# Patient Record
Sex: Female | Born: 1955
Health system: Southern US, Community
[De-identification: ages and names within clinical notes are randomized; demographics above are authoritative.]

## PROBLEM LIST (undated history)

## (undated) DIAGNOSIS — M1990 Unspecified osteoarthritis, unspecified site: Secondary | ICD-10-CM

## (undated) DIAGNOSIS — F419 Anxiety disorder, unspecified: Secondary | ICD-10-CM

## (undated) DIAGNOSIS — R918 Other nonspecific abnormal finding of lung field: Secondary | ICD-10-CM

## (undated) DIAGNOSIS — J45909 Unspecified asthma, uncomplicated: Secondary | ICD-10-CM

## (undated) DIAGNOSIS — K219 Gastro-esophageal reflux disease without esophagitis: Secondary | ICD-10-CM

## (undated) DIAGNOSIS — R011 Cardiac murmur, unspecified: Secondary | ICD-10-CM

## (undated) DIAGNOSIS — I10 Essential (primary) hypertension: Secondary | ICD-10-CM

## (undated) DIAGNOSIS — R609 Edema, unspecified: Secondary | ICD-10-CM

## (undated) DIAGNOSIS — R7303 Prediabetes: Secondary | ICD-10-CM

## (undated) HISTORY — PX: ROTATOR CUFF REPAIR: SHX139

## (undated) HISTORY — DX: Prediabetes: R73.03

## (undated) HISTORY — DX: Unspecified osteoarthritis, unspecified site: M19.90

## (undated) HISTORY — DX: Edema, unspecified: R60.9

## (undated) HISTORY — PX: REPLACEMENT TOTAL KNEE: SUR1224

## (undated) HISTORY — DX: Other nonspecific abnormal finding of lung field: R91.8

## (undated) HISTORY — PX: CHOLECYSTECTOMY: SHX55

## (undated) HISTORY — PX: TONSILLECTOMY: SUR1361

## (undated) HISTORY — DX: Anxiety disorder, unspecified: F41.9

---

## 1997-08-22 ENCOUNTER — Ambulatory Visit (HOSPITAL_COMMUNITY): Admission: RE | Admit: 1997-08-22 | Discharge: 1997-08-22 | Payer: Self-pay | Admitting: Family Medicine

## 1998-06-20 ENCOUNTER — Other Ambulatory Visit: Admission: RE | Admit: 1998-06-20 | Discharge: 1998-06-20 | Payer: Self-pay | Admitting: Gynecology

## 1999-10-01 ENCOUNTER — Other Ambulatory Visit: Admission: RE | Admit: 1999-10-01 | Discharge: 1999-10-01 | Payer: Self-pay | Admitting: Gynecology

## 2001-11-02 ENCOUNTER — Encounter: Payer: Self-pay | Admitting: Family Medicine

## 2001-11-02 ENCOUNTER — Ambulatory Visit (HOSPITAL_COMMUNITY): Admission: RE | Admit: 2001-11-02 | Discharge: 2001-11-02 | Payer: Self-pay | Admitting: Family Medicine

## 2002-01-06 ENCOUNTER — Other Ambulatory Visit: Admission: RE | Admit: 2002-01-06 | Discharge: 2002-01-06 | Payer: Self-pay | Admitting: Gynecology

## 2003-02-09 ENCOUNTER — Other Ambulatory Visit: Admission: RE | Admit: 2003-02-09 | Discharge: 2003-02-09 | Payer: Self-pay | Admitting: Gynecology

## 2004-02-26 ENCOUNTER — Other Ambulatory Visit: Admission: RE | Admit: 2004-02-26 | Discharge: 2004-02-26 | Payer: Self-pay | Admitting: Gynecology

## 2005-02-27 ENCOUNTER — Other Ambulatory Visit: Admission: RE | Admit: 2005-02-27 | Discharge: 2005-02-27 | Payer: Self-pay | Admitting: Gynecology

## 2008-08-10 ENCOUNTER — Encounter: Admission: RE | Admit: 2008-08-10 | Discharge: 2008-08-10 | Payer: Self-pay | Admitting: Family Medicine

## 2010-03-12 ENCOUNTER — Encounter: Admission: RE | Admit: 2010-03-12 | Discharge: 2010-03-12 | Payer: Self-pay | Admitting: Chiropractic Medicine

## 2011-10-17 ENCOUNTER — Other Ambulatory Visit: Payer: Self-pay | Admitting: Pediatrics

## 2011-10-17 DIAGNOSIS — J329 Chronic sinusitis, unspecified: Secondary | ICD-10-CM

## 2011-10-21 ENCOUNTER — Ambulatory Visit
Admission: RE | Admit: 2011-10-21 | Discharge: 2011-10-21 | Disposition: A | Discharge status: Home / Self Care | Payer: BC Managed Care – PPO | Source: Ambulatory Visit | Attending: Pediatrics | Admitting: Pediatrics

## 2011-10-21 DIAGNOSIS — J329 Chronic sinusitis, unspecified: Secondary | ICD-10-CM

## 2013-08-19 ENCOUNTER — Other Ambulatory Visit: Payer: Self-pay | Admitting: Gynecology

## 2013-08-19 DIAGNOSIS — Z780 Asymptomatic menopausal state: Secondary | ICD-10-CM

## 2013-09-02 ENCOUNTER — Ambulatory Visit
Admission: RE | Admit: 2013-09-02 | Discharge: 2013-09-02 | Disposition: A | Discharge status: Home / Self Care | Payer: BC Managed Care – PPO | Source: Ambulatory Visit | Attending: Gynecology | Admitting: Gynecology

## 2013-09-02 ENCOUNTER — Encounter (INDEPENDENT_AMBULATORY_CARE_PROVIDER_SITE_OTHER): Payer: Self-pay

## 2013-09-02 DIAGNOSIS — Z780 Asymptomatic menopausal state: Secondary | ICD-10-CM

## 2014-08-10 NOTE — H&P (Signed)
TOTAL HIP ADMISSION H&P  Patient is admitted for right total hip arthroplasty, anterior approach.  Subjective:  Chief Complaint:    Right hip primary OA / pain  HPI: Melanie Armstrong, 59 y.o. female, has a history of pain and functional disability in the right hip(s) due to arthritis and patient has failed non-surgical conservative treatments for greater than 12 weeks to include NSAID's and/or analgesics, corticosteriod injections and activity modification.  Onset of symptoms was gradual starting 2+ years ago with gradually worsening course since that time.The patient noted no past surgery on the right hip(s).  Patient currently rates pain in the right hip at 10 out of 10 with activity. Patient has night pain, worsening of pain with activity and weight bearing, trendelenberg gait, pain that interfers with activities of daily living and pain with passive range of motion. Patient has evidence of periarticular osteophytes and joint space narrowing by imaging studies. This condition presents safety issues increasing the risk of falls.  There is no current active infection.  Risks, benefits and expectations were discussed with the patient.  Risks including but not limited to the risk of anesthesia, blood clots, nerve damage, blood vessel damage, failure of the prosthesis, infection and up to and including death.  Patient understand the risks, benefits and expectations and wishes to proceed with surgery.   PCP: [name], MD  D/C Plans:      Home with HHPT  Post-op Meds:       No Rx given   Tranexamic Acid:      To be given - IV  Decadron:      Is to be given  FYI:     Xarelto post-op  (issues with ASA and asthma)  Norco post-op    Past Medical History  Diagnosis Date  . Hypertension   . Heart murmur     as a child   . Asthma   . GERD (gastroesophageal reflux disease)   . Arthritis     Past Surgical History  Procedure Laterality Date  . Cholecystectomy    . Tonsillectomy      No  prescriptions prior to admission   No Known Allergies   History  Substance Use Topics  . Smoking status: Never Smoker   . Smokeless tobacco: Never Used  . Alcohol Use: No       Review of Systems  Constitutional: Negative.   HENT: Negative.   Eyes: Negative.   Respiratory: Negative.   Cardiovascular: Negative.   Gastrointestinal: Positive for heartburn.  Genitourinary: Negative.   Musculoskeletal: Positive for joint pain.  Skin: Negative.   Neurological: Negative.   Endo/Heme/Allergies: Positive for environmental allergies.  Psychiatric/Behavioral: Negative.     Objective:  Physical Exam  Constitutional: She is oriented to person, place, and time. She appears well-developed and well-nourished.  HENT:  Head: Normocephalic and atraumatic.  Eyes: Pupils are equal, round, and reactive to light.  Neck: Neck supple. No JVD present. No tracheal deviation present. No thyromegaly present.  Cardiovascular: Normal rate, regular rhythm, normal heart sounds and intact distal pulses.   Respiratory: Effort normal and breath sounds normal. No stridor. No respiratory distress. She has no wheezes.  GI: Soft. There is no tenderness. There is no guarding.  Musculoskeletal:       Right hip: She exhibits decreased range of motion, decreased strength, tenderness and bony tenderness. She exhibits no swelling, no deformity and no laceration.  Lymphadenopathy:    She has no cervical adenopathy.  Neurological: She is  alert and oriented to person, place, and time.  Skin: Skin is warm and dry.  Psychiatric: She has a normal mood and affect.      Vital signs in last 24 hours: Temp:  [98.1 F (36.7 C)] 98.1 F (36.7 C) (05/03 1451) Pulse Rate:  [100] 100 (05/03 1451) Resp:  [16] 16 (05/03 1451) BP: (151)/(71) 151/71 mmHg (05/03 1451) SpO2:  [96 %] 96 % (05/03 1451) Weight:  [101.152 kg (223 lb)] 101.152 kg (223 lb) (05/03 1451)    Imaging Review Plain radiographs demonstrate severe  degenerative joint disease of the right hip(s). The bone quality appears to be good for age and reported activity level.  Assessment/Plan:  End stage arthritis, right hip(s)  The patient history, physical examination, clinical judgement of the provider and imaging studies are consistent with end stage degenerative joint disease of the right hip(s) and total hip arthroplasty is deemed medically necessary. The treatment options including medical management, injection therapy, arthroscopy and arthroplasty were discussed at length. The risks and benefits of total hip arthroplasty were presented and reviewed. The risks due to aseptic loosening, infection, stiffness, dislocation/subluxation,  thromboembolic complications and other imponderables were discussed.  The patient acknowledged the explanation, agreed to proceed with the plan and consent was signed. Patient is being admitted for inpatient treatment for surgery, pain control, PT, OT, prophylactic antibiotics, VTE prophylaxis, progressive ambulation and ADL's and discharge planning.The patient is planning to be discharged home with home health services.       [place] Vallie Teters   PA-C  08/16/2014, 12:46 PM

## 2014-08-14 NOTE — Patient Instructions (Signed)
Melanie Armstrong  08/14/2014   Your procedure is scheduled on: 08/22/2014    Report to [facility] Main  Entrance and follow signs to               [place] at        [facility] AM.  Call this number if you have problems the morning of surgery [phone]   Remember:  Do not eat food or drink liquids :After Midnight.     Take these medicines the morning of surgery with A SIP OF WATER:    Inhalers and bring, Nebulizer if needed, Azelastine  And bring , Symbicort, diazepam if needed, Allegra, Flonase, Prilosec, Spiriva                                 You may not have any metal on your body including hair pins and              piercings  Do not wear jewelry, make-up, lotions, powders or perfumes., deodorant.               Do not wear nail polish.  Do not shave  48 hours prior to surgery.     Do not bring valuables to the hospital. [place].  Contacts, dentures or bridgework may not be worn into surgery.  Leave suitcase in the car. After surgery it may be brought to your room.         Special Instructions: coughing and deep breathing exercises, leg exercises [place] - Preparing for Surgery Before surgery, you can play an important role.  Because skin is not sterile, your skin needs to be as free of germs as possible.  You can reduce the number of germs on your skin by washing with CHG (chlorahexidine gluconate) soap before surgery.  CHG is an antiseptic cleaner which kills germs and bonds with the skin to continue killing germs even after washing. Please DO NOT use if you have an allergy to CHG or antibacterial soaps.  If your skin becomes reddened/irritated stop using the CHG and inform your nurse when you arrive at Short Stay. Do not shave (including legs and underarms) for at least 48 hours prior to the first CHG shower.  You may shave your face/neck. Please follow these instructions carefully:  1.  Shower  with CHG Soap the night before surgery and the  morning of Surgery.  2.  If you choose to wash your hair, wash your hair first as usual with your  normal  shampoo.  3.  After you shampoo, rinse your hair and body thoroughly to remove the  shampoo.                           4.  Use CHG as you would any other liquid soap.  You can apply chg directly  to the skin and wash                       Gently with a scrungie or clean washcloth.  5.  Apply the CHG Soap to your body ONLY FROM THE NECK DOWN.   Do not use on face/ open  Wound or open sores. Avoid contact with eyes, ears mouth and genitals (private parts).                       Wash face,  Genitals (private parts) with your normal soap.             6.  Wash thoroughly, paying special attention to the area where your surgery  will be performed.  7.  Thoroughly rinse your body with warm water from the neck down.  8.  DO NOT shower/wash with your normal soap after using and rinsing off  the CHG Soap.                9.  Pat yourself dry with a clean towel.            10.  Wear clean pajamas.            11.  Place clean sheets on your bed the night of your first shower and do not  sleep with pets. Day of Surgery : Do not apply any lotions/deodorants the morning of surgery.  Please wear clean clothes to the hospital/surgery center.  FAILURE TO FOLLOW THESE INSTRUCTIONS MAY RESULT IN THE CANCELLATION OF YOUR SURGERY PATIENT SIGNATURE_________________________________  NURSE SIGNATURE__________________________________  ________________________________________________________________________  WHAT IS A BLOOD TRANSFUSION? Blood Transfusion Information  A transfusion is the replacement of blood or some of its parts. Blood is made up of multiple cells which provide different functions.  Red blood cells carry oxygen and are used for blood loss replacement.  White blood cells fight against infection.  Platelets control  bleeding.  Plasma helps clot blood.  Other blood products are available for specialized needs, such as hemophilia or other clotting disorders. BEFORE THE TRANSFUSION  Who gives blood for transfusions?   Healthy volunteers who are fully evaluated to make sure their blood is safe. This is blood bank blood. Transfusion therapy is the safest it has ever been in the practice of medicine. Before blood is taken from a donor, a complete history is taken to make sure that person has no history of diseases nor engages in risky social behavior (examples are intravenous drug use or sexual activity with multiple partners). The donor's travel history is screened to minimize risk of transmitting infections, such as malaria. The donated blood is tested for signs of infectious diseases, such as HIV and hepatitis. The blood is then tested to be sure it is compatible with you in order to minimize the chance of a transfusion reaction. If you or a relative donates blood, this is often done in anticipation of surgery and is not appropriate for emergency situations. It takes many days to process the donated blood. RISKS AND COMPLICATIONS Although transfusion therapy is very safe and saves many lives, the main dangers of transfusion include:  1. Getting an infectious disease. 2. Developing a transfusion reaction. This is an allergic reaction to something in the blood you were given. Every precaution is taken to prevent this. The decision to have a blood transfusion has been considered carefully by your caregiver before blood is given. Blood is not given unless the benefits outweigh the risks. AFTER THE TRANSFUSION  Right after receiving a blood transfusion, you will usually feel much better and more energetic. This is especially true if your red blood cells have gotten low (anemic). The transfusion raises the level of the red blood cells which carry oxygen, and this usually causes an energy increase.  The  nurse  administering the transfusion will monitor you carefully for complications. HOME CARE INSTRUCTIONS  No special instructions are needed after a transfusion. You may find your energy is better. Speak with your caregiver about any limitations on activity for underlying diseases you may have. SEEK MEDICAL CARE IF:   Your condition is not improving after your transfusion.  You develop redness or irritation at the intravenous (IV) site. SEEK IMMEDIATE MEDICAL CARE IF:  Any of the following symptoms occur over the next 12 hours:  Shaking chills.  You have a temperature by mouth above 102 F (38.9 C), not controlled by medicine.  Chest, back, or muscle pain.  People around you feel you are not acting correctly or are confused.  Shortness of breath or difficulty breathing.  Dizziness and fainting.  You get a rash or develop hives.  You have a decrease in urine output.  Your urine turns a dark color or changes to pink, red, or brown. Any of the following symptoms occur over the next 10 days:  You have a temperature by mouth above 102 F (38.9 C), not controlled by medicine.  Shortness of breath.  Weakness after normal activity.  The white part of the eye turns yellow (jaundice).  You have a decrease in the amount of urine or are urinating less often.  Your urine turns a dark color or changes to pink, red, or brown. Document Released: 03/28/2000 Document Revised: 06/23/2011 Document Reviewed: 11/15/2007 ExitCare Patient Information 2014 [place].  _______________________________________________________________________  Incentive Spirometer  An incentive spirometer is a tool that can help keep your lungs clear and active. This tool measures how well you are filling your lungs with each breath. Taking long deep breaths may help reverse or decrease the chance of developing breathing (pulmonary) problems (especially infection) following:  A long period of time when you are  unable to move or be active. BEFORE THE PROCEDURE   If the spirometer includes an indicator to show your best effort, your nurse or respiratory therapist will set it to a desired goal.  If possible, sit up straight or lean slightly forward. Try not to slouch.  Hold the incentive spirometer in an upright position. INSTRUCTIONS FOR USE  3. Sit on the edge of your bed if possible, or sit up as far as you can in bed or on a chair. 4. Hold the incentive spirometer in an upright position. 5. Breathe out normally. 6. Place the mouthpiece in your mouth and seal your lips tightly around it. 7. Breathe in slowly and as deeply as possible, raising the piston or the ball toward the top of the column. 8. Hold your breath for 3-5 seconds or for as long as possible. Allow the piston or ball to fall to the bottom of the column. 9. Remove the mouthpiece from your mouth and breathe out normally. 10. Rest for a few seconds and repeat Steps 1 through 7 at least 10 times every 1-2 hours when you are awake. Take your time and take a few normal breaths between deep breaths. 11. The spirometer may include an indicator to show your best effort. Use the indicator as a goal to work toward during each repetition. 12. After each set of 10 deep breaths, practice coughing to be sure your lungs are clear. If you have an incision (the cut made at the time of surgery), support your incision when coughing by placing a pillow or rolled up towels firmly against it. Once you are able to get  out of bed, walk around indoors and cough well. You may stop using the incentive spirometer when instructed by your caregiver.  RISKS AND COMPLICATIONS  Take your time so you do not get dizzy or light-headed.  If you are in pain, you may need to take or ask for pain medication before doing incentive spirometry. It is harder to take a deep breath if you are having pain. AFTER USE  Rest and breathe slowly and easily.  It can be helpful to  keep track of a log of your progress. Your caregiver can provide you with a simple table to help with this. If you are using the spirometer at home, follow these instructions: [place] IF:   You are having difficultly using the spirometer.  You have trouble using the spirometer as often as instructed.  Your pain medication is not giving enough relief while using the spirometer.  You develop fever of 100.5 F (38.1 C) or higher. SEEK IMMEDIATE MEDICAL CARE IF:   You cough up bloody sputum that had not been present before.  You develop fever of 102 F (38.9 C) or greater.  You develop worsening pain at or near the incision site. MAKE SURE YOU:   Understand these instructions.  Will watch your condition.  Will get help right away if you are not doing well or get worse. Document Released: 08/11/2006 Document Revised: 06/23/2011 Document Reviewed: 10/12/2006 ExitCare Patient Information 2014 ExitCare, [place].   ________________________________________________________________________               Please read over the following fact sheets you were given: _____________________________________________________________________

## 2014-08-15 ENCOUNTER — Encounter (HOSPITAL_COMMUNITY)
Admission: RE | Admit: 2014-08-15 | Discharge: 2014-08-15 | Disposition: A | Discharge status: Home / Self Care | Payer: BLUE CROSS/BLUE SHIELD | Source: Ambulatory Visit | Attending: Orthopedic Surgery | Admitting: Orthopedic Surgery

## 2014-08-15 ENCOUNTER — Encounter (HOSPITAL_COMMUNITY): Payer: Self-pay

## 2014-08-15 DIAGNOSIS — Z01812 Encounter for preprocedural laboratory examination: Secondary | ICD-10-CM | POA: Diagnosis not present

## 2014-08-15 DIAGNOSIS — M1610 Unilateral primary osteoarthritis, unspecified hip: Secondary | ICD-10-CM | POA: Diagnosis not present

## 2014-08-15 HISTORY — DX: Unspecified asthma, uncomplicated: J45.909

## 2014-08-15 HISTORY — DX: Essential (primary) hypertension: I10

## 2014-08-15 HISTORY — DX: Cardiac murmur, unspecified: R01.1

## 2014-08-15 HISTORY — DX: Gastro-esophageal reflux disease without esophagitis: K21.9

## 2014-08-15 HISTORY — DX: Unspecified osteoarthritis, unspecified site: M19.90

## 2014-08-15 LAB — URINALYSIS, ROUTINE W REFLEX MICROSCOPIC
BILIRUBIN URINE: NEGATIVE
GLUCOSE, UA: NEGATIVE mg/dL
HGB URINE DIPSTICK: NEGATIVE
KETONES UR: NEGATIVE mg/dL
Leukocytes, UA: NEGATIVE
Nitrite: NEGATIVE
PROTEIN: NEGATIVE mg/dL
Specific Gravity, Urine: 1.016 (ref 1.005–1.030)
UROBILINOGEN UA: 0.2 mg/dL (ref 0.0–1.0)
pH: 5 (ref 5.0–8.0)

## 2014-08-15 LAB — CBC
HEMATOCRIT: 44.1 % (ref 36.0–46.0)
Hemoglobin: 14.1 g/dL (ref 12.0–15.0)
MCH: 30.9 pg (ref 26.0–34.0)
MCHC: 32 g/dL (ref 30.0–36.0)
MCV: 96.7 fL (ref 78.0–100.0)
Platelets: 244 10*3/uL (ref 150–400)
RBC: 4.56 MIL/uL (ref 3.87–5.11)
RDW: 12.4 % (ref 11.5–15.5)
WBC: 7.2 10*3/uL (ref 4.0–10.5)

## 2014-08-15 LAB — BASIC METABOLIC PANEL
ANION GAP: 11 (ref 5–15)
BUN: 14 mg/dL (ref 6–20)
CO2: 26 mmol/L (ref 22–32)
Calcium: 9.7 mg/dL (ref 8.9–10.3)
Chloride: 105 mmol/L (ref 101–111)
Creatinine, Ser: 0.65 mg/dL (ref 0.44–1.00)
GFR calc Af Amer: >60 mL/min (ref >60)
GFR calc non Af Amer: >60 mL/min (ref >60)
Glucose, Bld: 91 mg/dL (ref 70–99)
Potassium: 3.7 mmol/L (ref 3.5–5.1)
Sodium: 142 mmol/L (ref 135–145)

## 2014-08-15 LAB — SURGICAL PCR SCREEN
MRSA, PCR: NEGATIVE
Staphylococcus aureus: NEGATIVE

## 2014-08-15 LAB — PROTIME-INR
INR: 0.93 (ref 0.00–1.49)
Prothrombin Time: 12.6 seconds (ref 11.6–15.2)

## 2014-08-15 LAB — APTT: aPTT: 30 seconds (ref 24–37)

## 2014-08-16 NOTE — Progress Notes (Signed)
Clearance dated 07/27/14 on chart from Dr Kenton Kingfisher.  LOV with Dr [name] 08/01/14 on chart and 07/27/14 on chart  [facility] note from 06/12/14 on chart

## 2014-08-16 NOTE — Progress Notes (Signed)
Final EKG done 08/15/2014 along with EKG from 08/2010 ( on chart ) shown to Dr Tresa Moore ( anesthesia ).  No further orders given.

## 2014-08-16 NOTE — Progress Notes (Signed)
Final EKG in EPIC done 08/15/2014.

## 2014-08-16 NOTE — Progress Notes (Signed)
Requested most recent EKG from office of [facility]- Dr [name].

## 2014-08-21 NOTE — Anesthesia Preprocedure Evaluation (Addendum)
Anesthesia Evaluation  Patient identified by MRN, date of birth, ID band Patient awake    Reviewed: Allergy & Precautions, Patient's Chart, lab work & pertinent test results  History of Anesthesia Complications Negative for: history of anesthetic complications  Airway Mallampati: II  TM Distance: >3 FB Neck ROM: Full    Dental  (+) Teeth Intact, Dental Advisory Given   Pulmonary asthma ,    Pulmonary exam normal       Cardiovascular hypertension, Pt. on medications Normal cardiovascular exam    Neuro/Psych negative neurological ROS  negative psych ROS   GI/Hepatic Neg liver ROS, GERD-  ,  Endo/Other  negative endocrine ROS  Renal/GU negative Renal ROS     Musculoskeletal   Abdominal   Peds  Hematology   Anesthesia Other Findings   Reproductive/Obstetrics                            Anesthesia Physical Anesthesia Plan  ASA: II  Anesthesia Plan: MAC and Spinal   Post-op Pain Management:    Induction:   Airway Management Planned: Simple Face Mask  Additional Equipment:   Intra-op Plan:   Post-operative Plan:   Informed Consent: I have reviewed the patients History and Physical, chart, labs and discussed the procedure including the risks, benefits and alternatives for the proposed anesthesia with the patient or authorized representative who has indicated his/her understanding and acceptance.   Dental advisory given  Plan Discussed with: CRNA, Anesthesiologist and Surgeon  Anesthesia Plan Comments:        Anesthesia Quick Evaluation

## 2014-08-22 ENCOUNTER — Inpatient Hospital Stay (HOSPITAL_COMMUNITY): Payer: BLUE CROSS/BLUE SHIELD | Admitting: Anesthesiology

## 2014-08-22 ENCOUNTER — Inpatient Hospital Stay (HOSPITAL_COMMUNITY): Payer: BLUE CROSS/BLUE SHIELD

## 2014-08-22 ENCOUNTER — Encounter (HOSPITAL_COMMUNITY)
Admission: RE | Disposition: A | Discharge status: Home / Self Care | Payer: Self-pay | Source: Ambulatory Visit | Attending: Orthopedic Surgery

## 2014-08-22 ENCOUNTER — Encounter (HOSPITAL_COMMUNITY): Payer: Self-pay | Admitting: *Deleted

## 2014-08-22 ENCOUNTER — Inpatient Hospital Stay (HOSPITAL_COMMUNITY)
Admission: RE | Admit: 2014-08-22 | Discharge: 2014-08-23 | DRG: 470 | Disposition: A | Discharge status: Home / Self Care | Payer: BLUE CROSS/BLUE SHIELD | Source: Ambulatory Visit | Attending: Orthopedic Surgery | Admitting: Orthopedic Surgery

## 2014-08-22 DIAGNOSIS — M1611 Unilateral primary osteoarthritis, right hip: Principal | ICD-10-CM | POA: Diagnosis present

## 2014-08-22 DIAGNOSIS — Z9049 Acquired absence of other specified parts of digestive tract: Secondary | ICD-10-CM | POA: Diagnosis present

## 2014-08-22 DIAGNOSIS — I10 Essential (primary) hypertension: Secondary | ICD-10-CM | POA: Diagnosis present

## 2014-08-22 DIAGNOSIS — Z6839 Body mass index (BMI) 39.0-39.9, adult: Secondary | ICD-10-CM | POA: Diagnosis not present

## 2014-08-22 DIAGNOSIS — E669 Obesity, unspecified: Secondary | ICD-10-CM | POA: Diagnosis present

## 2014-08-22 DIAGNOSIS — M25551 Pain in right hip: Secondary | ICD-10-CM | POA: Diagnosis present

## 2014-08-22 DIAGNOSIS — K219 Gastro-esophageal reflux disease without esophagitis: Secondary | ICD-10-CM | POA: Diagnosis present

## 2014-08-22 DIAGNOSIS — J45909 Unspecified asthma, uncomplicated: Secondary | ICD-10-CM | POA: Diagnosis present

## 2014-08-22 DIAGNOSIS — Z96649 Presence of unspecified artificial hip joint: Secondary | ICD-10-CM

## 2014-08-22 HISTORY — PX: TOTAL HIP ARTHROPLASTY: SHX124

## 2014-08-22 LAB — TYPE AND SCREEN
ABO/RH(D): O POS
Antibody Screen: NEGATIVE

## 2014-08-22 LAB — ABO/RH: ABO/RH(D): O POS

## 2014-08-22 SURGERY — ARTHROPLASTY, HIP, TOTAL, ANTERIOR APPROACH
Anesthesia: Monitor Anesthesia Care | Site: Hip | Laterality: Right

## 2014-08-22 MED ORDER — MONTELUKAST SODIUM 10 MG PO TABS
10.0000 mg | ORAL_TABLET | Freq: Every day | ORAL | Status: DC
  Administered 2014-08-22: 10 mg via ORAL
  Filled 2014-08-22 (×2): qty 1

## 2014-08-22 MED ORDER — HYDROCODONE-ACETAMINOPHEN 7.5-325 MG PO TABS
1.0000 | ORAL_TABLET | ORAL | Status: DC
  Administered 2014-08-22: 2 via ORAL
  Administered 2014-08-22: 1 via ORAL
  Administered 2014-08-22: 2 via ORAL
  Administered 2014-08-22: 1 via ORAL
  Administered 2014-08-22 – 2014-08-23 (×4): 2 via ORAL
  Filled 2014-08-22 (×3): qty 2
  Filled 2014-08-22: qty 1
  Filled 2014-08-22 (×2): qty 2
  Filled 2014-08-22: qty 1
  Filled 2014-08-22: qty 2

## 2014-08-22 MED ORDER — RIVAROXABAN 10 MG PO TABS: 10.0000 mg | ORAL_TABLET | ORAL | Status: DC

## 2014-08-22 MED ORDER — ONDANSETRON HCL 4 MG/2ML IJ SOLN
INTRAMUSCULAR | Status: DC | PRN
  Administered 2014-08-22: 4 mg via INTRAVENOUS

## 2014-08-22 MED ORDER — BUDESONIDE-FORMOTEROL FUMARATE 160-4.5 MCG/ACT IN AERO
2.0000 | INHALATION_SPRAY | Freq: Two times a day (BID) | RESPIRATORY_TRACT | Status: DC
Start: 2014-08-22 — End: 2014-08-23
  Administered 2014-08-22 – 2014-08-23 (×2): 2 via RESPIRATORY_TRACT
  Filled 2014-08-22: qty 6

## 2014-08-22 MED ORDER — DIAZEPAM 5 MG PO TABS
5.0000 mg | ORAL_TABLET | Freq: Four times a day (QID) | ORAL | Status: DC | PRN
Start: 2014-08-22 — End: 2014-08-23

## 2014-08-22 MED ORDER — FENTANYL CITRATE (PF) 100 MCG/2ML IJ SOLN
INTRAMUSCULAR | Status: DC | PRN
  Administered 2014-08-22: 100 ug via INTRAVENOUS

## 2014-08-22 MED ORDER — PHENOL 1.4 % MT LIQD: 1.0000 | OROMUCOSAL | Status: DC | PRN

## 2014-08-22 MED ORDER — DEXAMETHASONE SODIUM PHOSPHATE 10 MG/ML IJ SOLN
INTRAMUSCULAR | Status: AC
  Filled 2014-08-22: qty 1

## 2014-08-22 MED ORDER — FERROUS SULFATE 325 (65 FE) MG PO TABS: 325.0000 mg | ORAL_TABLET | Freq: Three times a day (TID) | ORAL | Status: DC

## 2014-08-22 MED ORDER — MIDAZOLAM HCL 2 MG/2ML IJ SOLN
INTRAMUSCULAR | Status: AC
  Filled 2014-08-22: qty 2

## 2014-08-22 MED ORDER — DEXAMETHASONE SODIUM PHOSPHATE 10 MG/ML IJ SOLN
10.0000 mg | Freq: Once | INTRAMUSCULAR | Status: AC
  Administered 2014-08-23: 10 mg via INTRAVENOUS
  Filled 2014-08-22: qty 1

## 2014-08-22 MED ORDER — POLYETHYLENE GLYCOL 3350 17 G PO PACK: 17.0000 g | PACK | Freq: Two times a day (BID) | ORAL | Status: DC

## 2014-08-22 MED ORDER — TRANEXAMIC ACID 1000 MG/10ML IV SOLN
1000.0000 mg | Freq: Once | INTRAVENOUS | Status: AC
  Administered 2014-08-22: 1000 mg via INTRAVENOUS
  Filled 2014-08-22: qty 10

## 2014-08-22 MED ORDER — PROPOFOL 10 MG/ML IV BOLUS
INTRAVENOUS | Status: AC
  Filled 2014-08-22: qty 20

## 2014-08-22 MED ORDER — LIDOCAINE HCL (CARDIAC) 20 MG/ML IV SOLN
INTRAVENOUS | Status: DC | PRN
  Administered 2014-08-22: 50 mg via INTRAVENOUS

## 2014-08-22 MED ORDER — ONDANSETRON HCL 4 MG PO TABS: 4.0000 mg | ORAL_TABLET | Freq: Four times a day (QID) | ORAL | Status: DC | PRN

## 2014-08-22 MED ORDER — SODIUM CHLORIDE 0.9 % IR SOLN
Status: DC | PRN
  Administered 2014-08-22: 1000 mL

## 2014-08-22 MED ORDER — LACTATED RINGERS IV SOLN: INTRAVENOUS | Status: DC

## 2014-08-22 MED ORDER — POLYETHYLENE GLYCOL 3350 17 G PO PACK
17.0000 g | PACK | Freq: Two times a day (BID) | ORAL | Status: DC
  Administered 2014-08-23: 17 g via ORAL

## 2014-08-22 MED ORDER — DIPHENHYDRAMINE HCL 25 MG PO CAPS: 25.0000 mg | ORAL_CAPSULE | Freq: Four times a day (QID) | ORAL | Status: DC | PRN

## 2014-08-22 MED ORDER — CHLORHEXIDINE GLUCONATE 4 % EX LIQD: 60.0000 mL | Freq: Once | CUTANEOUS | Status: DC

## 2014-08-22 MED ORDER — DEXAMETHASONE SODIUM PHOSPHATE 10 MG/ML IJ SOLN
10.0000 mg | Freq: Once | INTRAMUSCULAR | Status: AC
Start: 2014-08-22 — End: 2014-08-22
  Administered 2014-08-22: 10 mg via INTRAVENOUS

## 2014-08-22 MED ORDER — ONDANSETRON HCL 4 MG/2ML IJ SOLN: 4.0000 mg | Freq: Four times a day (QID) | INTRAMUSCULAR | Status: DC | PRN

## 2014-08-22 MED ORDER — FUROSEMIDE 20 MG PO TABS
20.0000 mg | ORAL_TABLET | Freq: Every morning | ORAL | Status: DC
  Administered 2014-08-22 – 2014-08-23 (×2): 20 mg via ORAL
  Filled 2014-08-22 (×2): qty 1

## 2014-08-22 MED ORDER — HYDROMORPHONE HCL 1 MG/ML IJ SOLN
0.5000 mg | INTRAMUSCULAR | Status: DC | PRN
  Administered 2014-08-22 (×2): 1 mg via INTRAVENOUS
  Filled 2014-08-22 (×2): qty 1

## 2014-08-22 MED ORDER — METHOCARBAMOL 500 MG PO TABS: 500.0000 mg | ORAL_TABLET | Freq: Four times a day (QID) | ORAL | Status: DC | PRN

## 2014-08-22 MED ORDER — MIDAZOLAM HCL 5 MG/5ML IJ SOLN
INTRAMUSCULAR | Status: DC | PRN
  Administered 2014-08-22: 2 mg via INTRAVENOUS

## 2014-08-22 MED ORDER — METHOCARBAMOL 1000 MG/10ML IJ SOLN
500.0000 mg | Freq: Four times a day (QID) | INTRAVENOUS | Status: DC | PRN
  Administered 2014-08-22: 500 mg via INTRAVENOUS
  Filled 2014-08-22 (×2): qty 5

## 2014-08-22 MED ORDER — AZELASTINE HCL 0.1 % NA SOLN
2.0000 | Freq: Every day | NASAL | Status: DC
  Administered 2014-08-22: 2 via NASAL
  Filled 2014-08-22: qty 30

## 2014-08-22 MED ORDER — FLUTICASONE PROPIONATE 50 MCG/ACT NA SUSP
1.0000 | Freq: Every day | NASAL | Status: DC
  Administered 2014-08-23: 1 via NASAL
  Filled 2014-08-22: qty 16

## 2014-08-22 MED ORDER — BUPIVACAINE HCL (PF) 0.5 % IJ SOLN
INTRAMUSCULAR | Status: DC | PRN
  Administered 2014-08-22: 3 mL

## 2014-08-22 MED ORDER — FERROUS SULFATE 325 (65 FE) MG PO TABS
325.0000 mg | ORAL_TABLET | Freq: Three times a day (TID) | ORAL | Status: DC
  Administered 2014-08-23: 325 mg via ORAL
  Filled 2014-08-22 (×5): qty 1

## 2014-08-22 MED ORDER — MAGNESIUM CITRATE PO SOLN: 1.0000 | Freq: Once | ORAL | Status: AC | PRN

## 2014-08-22 MED ORDER — HYDROCODONE-ACETAMINOPHEN 7.5-325 MG PO TABS: 1.0000 | ORAL_TABLET | ORAL | Status: DC | PRN

## 2014-08-22 MED ORDER — ALBUTEROL SULFATE (2.5 MG/3ML) 0.083% IN NEBU: 2.5000 mg | INHALATION_SOLUTION | Freq: Four times a day (QID) | RESPIRATORY_TRACT | Status: DC | PRN

## 2014-08-22 MED ORDER — PROPOFOL INFUSION 10 MG/ML OPTIME
INTRAVENOUS | Status: DC | PRN
  Administered 2014-08-22: 100 ug/kg/min via INTRAVENOUS

## 2014-08-22 MED ORDER — MENTHOL 3 MG MT LOZG: 1.0000 | LOZENGE | OROMUCOSAL | Status: DC | PRN

## 2014-08-22 MED ORDER — LIDOCAINE HCL (CARDIAC) 20 MG/ML IV SOLN
INTRAVENOUS | Status: AC
  Filled 2014-08-22: qty 5

## 2014-08-22 MED ORDER — OMEPRAZOLE 20 MG PO CPDR
20.0000 mg | DELAYED_RELEASE_CAPSULE | Freq: Every day | ORAL | Status: DC
  Administered 2014-08-23: 20 mg via ORAL
  Filled 2014-08-22 (×2): qty 1

## 2014-08-22 MED ORDER — CELECOXIB 200 MG PO CAPS
200.0000 mg | ORAL_CAPSULE | Freq: Two times a day (BID) | ORAL | Status: DC
  Administered 2014-08-22 – 2014-08-23 (×2): 200 mg via ORAL
  Filled 2014-08-22 (×3): qty 1

## 2014-08-22 MED ORDER — PROMETHAZINE HCL 25 MG/ML IJ SOLN: 6.2500 mg | INTRAMUSCULAR | Status: DC | PRN

## 2014-08-22 MED ORDER — FENTANYL CITRATE (PF) 100 MCG/2ML IJ SOLN
INTRAMUSCULAR | Status: AC
  Filled 2014-08-22: qty 2

## 2014-08-22 MED ORDER — TIOTROPIUM BROMIDE MONOHYDRATE 18 MCG IN CAPS
18.0000 ug | ORAL_CAPSULE | Freq: Every day | RESPIRATORY_TRACT | Status: DC
Start: 2014-08-22 — End: 2014-08-23
  Administered 2014-08-22 – 2014-08-23 (×2): 18 ug via RESPIRATORY_TRACT
  Filled 2014-08-22: qty 5

## 2014-08-22 MED ORDER — DOCUSATE SODIUM 100 MG PO CAPS
100.0000 mg | ORAL_CAPSULE | Freq: Two times a day (BID) | ORAL | Status: DC
  Administered 2014-08-22 – 2014-08-23 (×3): 100 mg via ORAL

## 2014-08-22 MED ORDER — LORATADINE 10 MG PO TABS
10.0000 mg | ORAL_TABLET | Freq: Every day | ORAL | Status: DC
  Administered 2014-08-23: 10 mg via ORAL
  Filled 2014-08-22 (×2): qty 1

## 2014-08-22 MED ORDER — METOCLOPRAMIDE HCL 10 MG PO TABS: 5.0000 mg | ORAL_TABLET | Freq: Three times a day (TID) | ORAL | Status: DC | PRN

## 2014-08-22 MED ORDER — HYDROMORPHONE HCL 1 MG/ML IJ SOLN: 0.2500 mg | INTRAMUSCULAR | Status: DC | PRN

## 2014-08-22 MED ORDER — LACTATED RINGERS IV SOLN
INTRAVENOUS | Status: DC | PRN
  Administered 2014-08-22 (×2): via INTRAVENOUS

## 2014-08-22 MED ORDER — RIVAROXABAN 10 MG PO TABS
10.0000 mg | ORAL_TABLET | ORAL | Status: DC
  Administered 2014-08-23: 10 mg via ORAL
  Filled 2014-08-22 (×2): qty 1

## 2014-08-22 MED ORDER — CEFAZOLIN SODIUM-DEXTROSE 2-3 GM-% IV SOLR
INTRAVENOUS | Status: AC
  Filled 2014-08-22: qty 50

## 2014-08-22 MED ORDER — DOCUSATE SODIUM 100 MG PO CAPS: 100.0000 mg | ORAL_CAPSULE | Freq: Two times a day (BID) | ORAL | Status: DC

## 2014-08-22 MED ORDER — METOCLOPRAMIDE HCL 5 MG/ML IJ SOLN: 5.0000 mg | Freq: Three times a day (TID) | INTRAMUSCULAR | Status: DC | PRN

## 2014-08-22 MED ORDER — BUPIVACAINE HCL (PF) 0.5 % IJ SOLN
INTRAMUSCULAR | Status: AC
  Filled 2014-08-22: qty 30

## 2014-08-22 MED ORDER — PROPOFOL 10 MG/ML IV BOLUS: INTRAVENOUS | Status: DC | PRN

## 2014-08-22 MED ORDER — ALBUTEROL SULFATE HFA 108 (90 BASE) MCG/ACT IN AERS: 1.0000 | INHALATION_SPRAY | Freq: Four times a day (QID) | RESPIRATORY_TRACT | Status: DC | PRN

## 2014-08-22 MED ORDER — CEFAZOLIN SODIUM-DEXTROSE 2-3 GM-% IV SOLR
2.0000 g | Freq: Four times a day (QID) | INTRAVENOUS | Status: AC
  Administered 2014-08-22 (×2): 2 g via INTRAVENOUS
  Filled 2014-08-22 (×2): qty 50

## 2014-08-22 MED ORDER — ALUM & MAG HYDROXIDE-SIMETH 200-200-20 MG/5ML PO SUSP: 30.0000 mL | ORAL | Status: DC | PRN

## 2014-08-22 MED ORDER — CEFAZOLIN SODIUM-DEXTROSE 2-3 GM-% IV SOLR
2.0000 g | INTRAVENOUS | Status: AC
  Administered 2014-08-22: 2 g via INTRAVENOUS

## 2014-08-22 MED ORDER — ONDANSETRON HCL 4 MG/2ML IJ SOLN
INTRAMUSCULAR | Status: AC
  Filled 2014-08-22: qty 2

## 2014-08-22 MED ORDER — SODIUM CHLORIDE 0.9 % IV SOLN
100.0000 mL/h | INTRAVENOUS | Status: DC
  Administered 2014-08-22: 100 mL/h via INTRAVENOUS
  Filled 2014-08-22 (×8): qty 1000

## 2014-08-22 MED ORDER — FENTANYL CITRATE (PF) 250 MCG/5ML IJ SOLN
INTRAMUSCULAR | Status: AC
  Filled 2014-08-22: qty 5

## 2014-08-22 MED ORDER — BISACODYL 10 MG RE SUPP: 10.0000 mg | Freq: Every day | RECTAL | Status: DC | PRN

## 2014-08-22 SURGICAL SUPPLY — 43 items
BAG DECANTER FOR FLEXI CONT (MISCELLANEOUS) IMPLANT
BAG SPEC THK2 15X12 ZIP CLS (MISCELLANEOUS)
BAG ZIPLOCK 12X15 (MISCELLANEOUS) IMPLANT
CAPT HIP TOTAL 2 ×2 IMPLANT
COVER PERINEAL POST (MISCELLANEOUS) ×2 IMPLANT
DRAPE C-ARM 42X120 X-RAY (DRAPES) ×2 IMPLANT
DRAPE STERI IOBAN 125X83 (DRAPES) ×2 IMPLANT
DRAPE U-SHAPE 47X51 STRL (DRAPES) ×6 IMPLANT
DRSG AQUACEL AG ADV 3.5X10 (GAUZE/BANDAGES/DRESSINGS) ×2 IMPLANT
DURAPREP 26ML APPLICATOR (WOUND CARE) ×2 IMPLANT
ELECT BLADE TIP CTD 4 INCH (ELECTRODE) ×2 IMPLANT
ELECT PENCIL ROCKER SW 15FT (MISCELLANEOUS) ×2 IMPLANT
ELECT REM PT RETURN 15FT ADLT (MISCELLANEOUS) ×2 IMPLANT
ELECT REM PT RETURN 9FT ADLT (ELECTROSURGICAL) ×2
ELECTRODE REM PT RTRN 9FT ADLT (ELECTROSURGICAL) ×1 IMPLANT
FACESHIELD WRAPAROUND (MASK) ×8 IMPLANT
GLOVE BIOGEL PI IND STRL 7.5 (GLOVE) ×1 IMPLANT
GLOVE BIOGEL PI IND STRL 8.5 (GLOVE) ×1 IMPLANT
GLOVE BIOGEL PI INDICATOR 7.5 (GLOVE) ×1
GLOVE BIOGEL PI INDICATOR 8.5 (GLOVE) ×1
GLOVE ECLIPSE 8.0 STRL XLNG CF (GLOVE) ×4 IMPLANT
GLOVE ORTHO TXT STRL SZ7.5 (GLOVE) ×2 IMPLANT
GOWN SPEC L3 XXLG W/TWL (GOWN DISPOSABLE) ×2 IMPLANT
GOWN STRL REUS W/TWL LRG LVL3 (GOWN DISPOSABLE) ×2 IMPLANT
HOLDER FOLEY CATH W/STRAP (MISCELLANEOUS) ×2 IMPLANT
KIT BASIN OR (CUSTOM PROCEDURE TRAY) ×2 IMPLANT
LIQUID BAND (GAUZE/BANDAGES/DRESSINGS) ×2 IMPLANT
NDL SAFETY ECLIPSE 18X1.5 (NEEDLE) IMPLANT
NEEDLE HYPO 18GX1.5 SHARP (NEEDLE)
PACK TOTAL JOINT (CUSTOM PROCEDURE TRAY) ×2 IMPLANT
PEN SKIN MARKING BROAD (MISCELLANEOUS) ×2 IMPLANT
SAW OSC TIP CART 19.5X105X1.3 (SAW) ×2 IMPLANT
SUT MNCRL AB 4-0 PS2 18 (SUTURE) ×2 IMPLANT
SUT VIC AB 1 CT1 36 (SUTURE) ×6 IMPLANT
SUT VIC AB 2-0 CT1 27 (SUTURE) ×4
SUT VIC AB 2-0 CT1 TAPERPNT 27 (SUTURE) ×2 IMPLANT
SUT VLOC 180 0 24IN GS25 (SUTURE) ×2 IMPLANT
SYR 50ML LL SCALE MARK (SYRINGE) IMPLANT
TOWEL OR 17X26 10 PK STRL BLUE (TOWEL DISPOSABLE) ×2 IMPLANT
TOWEL OR NON WOVEN STRL DISP B (DISPOSABLE) IMPLANT
TRAY FOLEY W/METER SILVER 14FR (SET/KITS/TRAYS/PACK) ×2 IMPLANT
WATER STERILE IRR 1500ML POUR (IV SOLUTION) ×2 IMPLANT
YANKAUER SUCT BULB TIP 10FT TU (MISCELLANEOUS) ×2 IMPLANT

## 2014-08-22 NOTE — Anesthesia Postprocedure Evaluation (Signed)
Anesthesia Post Note  Patient: Melanie Armstrong  Procedure(s) Performed: Procedure(s) (LRB): RIGHT TOTAL HIP ARTHROPLASTY ANTERIOR APPROACH (Right)  Anesthesia type: MAC/SAB  Patient location: PACU  Post pain: Pain level controlled  Post assessment: Patient's Cardiovascular Status Stable  Last Vitals:  Filed Vitals:   08/22/14 1000  BP: 134/70  Pulse: 91  Temp: 36.6 C  Resp: 21    Post vital signs: Reviewed and stable  Level of consciousness: sedated  Complications: No apparent anesthesia complications

## 2014-08-22 NOTE — Discharge Instructions (Addendum)
INSTRUCTIONS AFTER JOINT REPLACEMENT  ° °o Remove items at home which could result in a fall. This includes throw rugs or furniture in walking pathways °o ICE to the affected joint every three hours while awake for 30 minutes at a time, for at least the first 3-5 days, and then as needed for pain and swelling.  Continue to use ice for pain and swelling. You may notice swelling that will progress down to the foot and ankle.  This is normal after surgery.  Elevate your leg when you are not up walking on it.   °o Continue to use the breathing machine you got in the hospital (incentive spirometer) which will help keep your temperature down.  It is common for your temperature to cycle up and down following surgery, especially at night when you are not up moving around and exerting yourself.  The breathing machine keeps your lungs expanded and your temperature down. ° ° °DIET:  As you were doing prior to hospitalization, we recommend a well-balanced diet. ° °DRESSING / WOUND CARE / SHOWERING ° °Keep the surgical dressing until follow up.  The dressing is water proof, so you can shower without any extra covering.  IF THE DRESSING FALLS OFF or the wound gets wet inside, change the dressing with sterile gauze.  Please use good hand washing techniques before changing the dressing.  Do not use any lotions or creams on the incision until instructed by your surgeon.   ° °ACTIVITY ° °o Increase activity slowly as tolerated, but follow the weight bearing instructions below.   °o No driving for 6 weeks or until further direction given by your physician.  You cannot drive while taking narcotics.  °o No lifting or carrying greater than 10 lbs. until further directed by your surgeon. °o Avoid periods of inactivity such as sitting longer than an hour when not asleep. This helps prevent blood clots.  °o You may return to work once you are authorized by your doctor.  ° ° ° °WEIGHT BEARING  ° °Weight bearing as tolerated with assist  device (walker, cane, etc) as directed, use it as long as suggested by your surgeon or therapist, typically at least 4-6 weeks. ° ° °EXERCISES ° °Results after joint replacement surgery are often greatly improved when you follow the exercise, range of motion and muscle strengthening exercises prescribed by your doctor. Safety measures are also important to protect the joint from further injury. Any time any of these exercises cause you to have increased pain or swelling, decrease what you are doing until you are comfortable again and then slowly increase them. If you have problems or questions, call your caregiver or physical therapist for advice.  ° °Rehabilitation is important following a joint replacement. After just a few days of immobilization, the muscles of the leg can become weakened and shrink (atrophy).  These exercises are designed to build up the tone and strength of the thigh and leg muscles and to improve motion. Often times heat used for twenty to thirty minutes before working out will loosen up your tissues and help with improving the range of motion but do not use heat for the first two weeks following surgery (sometimes heat can increase post-operative swelling).  ° °These exercises can be done on a training (exercise) mat, on the floor, on a table or on a bed. Use whatever works the best and is most comfortable for you.    Use music or television while you are exercising so that   the exercises are a pleasant break in your day. This will make your life better with the exercises acting as a break in your routine that you can look forward to.   Perform all exercises about fifteen times, three times per day or as directed.  You should exercise both the operative leg and the other leg as well. ° °Exercises include: °  °• Quad Sets - Tighten up the muscle on the front of the thigh (Quad) and hold for 5-10 seconds.   °• Straight Leg Raises - With your knee straight (if you were given a brace, keep it on),  lift the leg to 60 degrees, hold for 3 seconds, and slowly lower the leg.  Perform this exercise against resistance later as your leg gets stronger.  °• Leg Slides: Lying on your back, slowly slide your foot toward your buttocks, bending your knee up off the floor (only go as far as is comfortable). Then slowly slide your foot back down until your leg is flat on the floor again.  °• Angel Wings: Lying on your back spread your legs to the side as far apart as you can without causing discomfort.  °• Hamstring Strength:  Lying on your back, push your heel against the floor with your leg straight by tightening up the muscles of your buttocks.  Repeat, but this time bend your knee to a comfortable angle, and push your heel against the floor.  You may put a pillow under the heel to make it more comfortable if necessary.  ° °A rehabilitation program following joint replacement surgery can speed recovery and prevent re-injury in the future due to weakened muscles. Contact your doctor or a physical therapist for more information on knee rehabilitation.  ° ° °CONSTIPATION ° °Constipation is defined medically as fewer than three stools per week and severe constipation as less than one stool per week.  Even if you have a regular bowel pattern at home, your normal regimen is likely to be disrupted due to multiple reasons following surgery.  Combination of anesthesia, postoperative narcotics, change in appetite and fluid intake all can affect your bowels.  ° °YOU MUST use at least one of the following options; they are listed in order of increasing strength to get the job done.  They are all available over the counter, and you may need to use some, POSSIBLY even all of these options:   ° °Drink plenty of fluids (prune juice may be helpful) and high fiber foods °Colace 100 mg by mouth twice a day  °Senokot for constipation as directed and as needed Dulcolax (bisacodyl), take with full glass of water  °Miralax (polyethylene glycol)  once or twice a day as needed. ° °If you have tried all these things and are unable to have a bowel movement in the first 3-4 days after surgery call either your surgeon or your primary doctor.   ° °If you experience loose stools or diarrhea, hold the medications until you stool forms back up.  If your symptoms do not get better within 1 week or if they get worse, check with your doctor.  If you experience "the worst abdominal pain ever" or develop nausea or vomiting, please contact the office immediately for further recommendations for treatment. ° ° °ITCHING:  If you experience itching with your medications, try taking only a single pain pill, or even half a pain pill at a time.  You can also use Benadryl over the counter for itching or also to   help with sleep.   TED HOSE STOCKINGS:  Use stockings on both legs until for at least 2 weeks or as directed by physician office. They may be removed at night for sleeping.  MEDICATIONS:  See your medication summary on the After Visit Summary that nursing will review with you.  You may have some home medications which will be placed on hold until you complete the course of blood thinner medication.  It is important for you to complete the blood thinner medication as prescribed.  PRECAUTIONS:  If you experience chest pain or shortness of breath - call 911 immediately for transfer to the hospital emergency department.   If you develop a fever greater that 101 F, purulent drainage from wound, increased redness or drainage from wound, foul odor from the wound/dressing, or calf pain - CONTACT YOUR SURGEON.                                                   FOLLOW-UP APPOINTMENTS:  If you do not already have a post-op appointment, please call the office for an appointment to be seen by your surgeon.  Guidelines for how soon to be seen are listed in your After Visit Summary, but are typically between 1-4 weeks after surgery.  OTHER INSTRUCTIONS:   Knee  Replacement:  Do not place pillow under knee, focus on keeping the knee straight while resting.   MAKE SURE YOU:   Understand these instructions.   Get help right away if you are not doing well or get worse.    Thank you for letting us be a part of your medical care team.  It is a privilege we respect greatly.  We hope these instructions will help you stay on track for a fast and full recovery!   Information on my medicine - XARELTO (Rivaroxaban)  This medication education was reviewed with me or my healthcare representative as part of my discharge preparation.  The pharmacist that spoke with me during my hospital stay was:  Angela [facility]  Why was Xarelto prescribed for you? Xarelto was prescribed for you to reduce the risk of blood clots forming after orthopedic surgery. The medical term for these abnormal blood clots is venous thromboembolism (VTE).  What do you need to know about xarelto ? Take your Xarelto ONCE DAILY at the same time every day. You may take it either with or without food.  If you have difficulty swallowing the tablet whole, you may crush it and mix in applesauce just prior to taking your dose.  Take Xarelto exactly as prescribed by your doctor and DO NOT stop taking Xarelto without talking to the doctor who prescribed the medication.  Stopping without other VTE prevention medication to take the place of Xarelto may increase your risk of developing a clot.  After discharge, you should have regular check-up appointments with your healthcare provider that is prescribing your Xarelto.    What do you do if you miss a dose? If you miss a dose, take it as soon as you remember on the same day then continue your regularly scheduled once daily regimen the next day. Do not take two doses of Xarelto on the same day.   Important Safety Information A possible side effect of Xarelto is bleeding. You should call your healthcare provider right away if you  experience any of the following: ? Bleeding from an injury or your nose that does not stop. ? Unusual colored urine (red or dark brown) or unusual colored stools (red or black). ? Unusual bruising for unknown reasons. ? A serious fall or if you hit your head (even if there is no bleeding).  Some medicines may interact with Xarelto and might increase your risk of bleeding while on Xarelto. To help avoid this, consult your healthcare provider or pharmacist prior to using any new prescription or non-prescription medications, including herbals, vitamins, non-steroidal anti-inflammatory drugs (NSAIDs) and supplements.  This website has more information on Xarelto: [email or web address]

## 2014-08-22 NOTE — Transfer of Care (Signed)
Immediate Anesthesia Transfer of Care Note  Patient: Melanie Armstrong  Procedure(s) Performed: Procedure(s): RIGHT TOTAL HIP ARTHROPLASTY ANTERIOR APPROACH (Right)  Patient Location: PACU  Anesthesia Type:Spinal  Level of Consciousness: awake, alert  and oriented  Airway & Oxygen Therapy: Patient Spontanous Breathing and Patient connected to face mask oxygen  Post-op Assessment: Report given to RN and Post -op Vital signs reviewed and stable  Post vital signs: Reviewed and stable  Last Vitals:  Filed Vitals:   08/22/14 0511  BP: 149/76  Pulse: 95  Temp: 37 C  Resp: 18    Complications: No apparent anesthesia complications

## 2014-08-22 NOTE — Op Note (Signed)
NAME:  Melanie Armstrong                ACCOUNT NO.: [id number]      MEDICAL RECORD NO.: [id number]      FACILITY:  [facility]      PHYSICIAN:  Paralee Cancel D  DATE OF BIRTH:  1955/11/30     DATE OF PROCEDURE:  08/22/2014                                 OPERATIVE REPORT         PREOPERATIVE DIAGNOSIS: Right  hip osteoarthritis.      POSTOPERATIVE DIAGNOSIS:  Right hip osteoarthritis.      PROCEDURE:  Right total hip replacement through an anterior approach   utilizing DePuy THR system, component size 41mm pinnacle cup, a size 32+4 neutral   Altrex liner, a size 4 HI Tri Lock stem with a 32+1 delta ceramic   ball.      SURGEON:  Pietro Cassis. Alvan Dame, M.D.      ASSISTANT:  Danae Orleans, PA-C     ANESTHESIA:  Spinal.      SPECIMENS:  None.      COMPLICATIONS:  None.      BLOOD LOSS:  100 cc     DRAINS:  None.      INDICATION OF THE PROCEDURE:  Melanie Armstrong is a 59 y.o. female who had   presented to office for evaluation of right hip pain.  Radiographs revealed   progressive degenerative changes with bone-on-bone   articulation to the  hip joint.  The patient had painful limited range of   motion significantly affecting their overall quality of life.  The patient was failing to    respond to conservative measures, and at this point was ready   to proceed with more definitive measures.  The patient has noted progressive   degenerative changes in his hip, progressive problems and dysfunction   with regarding the hip prior to surgery.  Consent was obtained for   benefit of pain relief.  Specific risk of infection, DVT, component   failure, dislocation, need for revision surgery, as well discussion of   the anterior versus posterior approach were reviewed.  Consent was   obtained for benefit of anterior pain relief through an anterior   approach.      PROCEDURE IN DETAIL:  The patient was brought to operative theater.   Once adequate anesthesia, preoperative  antibiotics, 2gm of Ancef, 1gm of Tranexamic Acid, and 10mg  of Decadron administered.   The patient was positioned supine on the OSI Hanna table.  Once adequate   padding of boney process was carried out, we had predraped out the hip, and  used fluoroscopy to confirm orientation of the pelvis and position.      The right hip was then prepped and draped from proximal iliac crest to   mid thigh with shower curtain technique.      Time-out was performed identifying the patient, planned procedure, and   extremity.     An incision was then made 2 cm distal and lateral to the   anterior superior iliac spine extending over the orientation of the   tensor fascia lata muscle and sharp dissection was carried down to the   fascia of the muscle and protractor placed in the soft tissues.      The fascia was then incised.  The muscle belly was identified and swept   laterally and retractor placed along the superior neck.  Following   cauterization of the circumflex vessels and removing some pericapsular   fat, a second cobra retractor was placed on the inferior neck.  A third   retractor was placed on the anterior acetabulum after elevating the   anterior rectus.  A L-capsulotomy was along the line of the   superior neck to the trochanteric fossa, then extended proximally and   distally.  Tag sutures were placed and the retractors were then placed   intracapsular.  We then identified the trochanteric fossa and   orientation of my neck cut, confirmed this radiographically   and then made a neck osteotomy with the femur on traction.  The femoral   head was removed without difficulty or complication.  Traction was let   off and retractors were placed posterior and anterior around the   acetabulum.      The labrum and foveal tissue were debrided.  I began reaming with a 61mm   reamer and reamed up to 25mm reamer with good bony bed preparation and a 85mm   cup was chosen.  The final 9mm Pinnacle cup  was then impacted under fluoroscopy  to confirm the depth of penetration and orientation with respect to   abduction.  A screw was placed followed by the hole eliminator.  The final   32+4 neutral Altrex liner was impacted with good visualized rim fit.  The cup was positioned anatomically within the acetabular portion of the pelvis.      At this point, the femur was rolled at 80 degrees.  Further capsule was   released off the inferior aspect of the femoral neck.  I then   released the superior capsule proximally.  The hook was placed laterally   along the femur and elevated manually and held in position with the bed   hook.  The leg was then extended and adducted with the leg rolled to 100   degrees of external rotation.  Once the proximal femur was fully   exposed, I used a box osteotome to set orientation.  I then began   broaching with the starting chili pepper broach and passed this by hand and then broached up to 4.  With the 4 broach in place I chose a high offset neck and did a trial reduction.  The offset was appropriate, leg lengths   appeared to be equal, confirmed radiographically.   Given these findings, I went ahead and dislocated the hip, repositioned all   retractors and positioned the right hip in the extended and abducted position.  The final 4 Hi Tri Lock stem was   chosen and it was impacted down to the level of neck cut.  Based on this   and the trial reduction, a 32+1 delta ceramic ball was chosen and   impacted onto a clean and dry trunnion, and the hip was reduced.  The   hip had been irrigated throughout the case again at this point.  I did   reapproximate the superior capsular leaflet to the anterior leaflet   using #1 Vicryl.  The fascia of the   tensor fascia lata muscle was then reapproximated using #1 Vicryl and #0 V-lock sutures.  The   remaining wound was closed with 2-0 Vicryl and running 4-0 Monocryl.   The hip was cleaned, dried, and dressed sterilely using  Dermabond and   Aquacel dressing.  She was then brought   to recovery room in stable condition tolerating the procedure well.    Danae Orleans, PA-C was present for the entirety of the case involved from   preoperative positioning, perioperative retractor management, general   facilitation of the case, as well as primary wound closure as assistant.            Pietro Cassis Alvan Dame, M.D.        08/22/2014 8:28 AM

## 2014-08-22 NOTE — Anesthesia Procedure Notes (Signed)
Spinal Patient location during procedure: OR End time: 08/22/2014 7:14 AM Staffing Resident/CRNA: Noralyn Pick D Performed by: anesthesiologist and resident/CRNA  Preanesthetic Checklist Completed: patient identified, site marked, surgical consent, pre-op evaluation, timeout performed, IV checked, risks and benefits discussed and monitors and equipment checked Spinal Block Patient position: sitting Prep: Betadine Patient monitoring: heart rate, continuous pulse ox and blood pressure Approach: midline Location: L2-3 Injection technique: single-shot Needle Needle type: Sprotte and Pencil-Tip  Needle gauge: 24 G Needle length: 9 cm Assessment Sensory level: T6 Additional Notes Expiration date of kit checked and confirmed. Patient tolerated procedure well, without complications.

## 2014-08-22 NOTE — Interval H&P Note (Signed)
History and Physical Interval Note:  08/22/2014 6:36 AM  Melanie Armstrong  has presented today for surgery, with the diagnosis of RIGHT HIP OA  The various methods of treatment have been discussed with the patient and family. After consideration of risks, benefits and other options for treatment, the patient has consented to  Procedure(s): RIGHT TOTAL HIP ARTHROPLASTY ANTERIOR APPROACH (Right) as a surgical intervention .  The patient's history has been reviewed, patient examined, no change in status, stable for surgery.  I have reviewed the patient's chart and labs.  Questions were answered to the patient's satisfaction.     Mauri Pole

## 2014-08-22 NOTE — Evaluation (Signed)
Physical Therapy Evaluation Patient Details Name: Melanie Armstrong MRN: [id number] DOB: Nov 22, 1955 Today's Date: 08/22/2014   History of Present Illness  59 yo female s/p R THA-direct anterior 08/22/14.   Clinical Impression  On eval POD 0, pt was Min assist for mobility-able to ambulate ~60 feet with RW. Pt rated pain 8/10 with activity.     Follow Up Recommendations Home health PT    Equipment Recommendations  None recommended by PT    Recommendations for Other Services       Precautions / Restrictions Precautions Precautions: Fall Restrictions Weight Bearing Restrictions: No RLE Weight Bearing: Weight bearing as tolerated      Mobility  Bed Mobility Overal bed mobility: Needs Assistance Bed Mobility: Supine to Sit     Supine to sit: Min assist     General bed mobility comments: Assist for R LE  Transfers Overall transfer level: Needs assistance Equipment used: Rolling walker (2 wheeled) Transfers: Sit to/from Stand Sit to Stand: Min guard         General transfer comment: close guard for safety. VCS safety, hand placement  Ambulation/Gait Ambulation/Gait assistance: Min assist Ambulation Distance (Feet): 60 Feet Assistive device: Rolling walker (2 wheeled) Gait Pattern/deviations: Antalgic;Step-to pattern;Decreased step length - right     General Gait Details: VCs safety, sequence. close guard for safety. Once up walking, pt requested to walk more.  Stairs            Wheelchair Mobility    Modified Rankin (Stroke Patients Only)       Balance                                             Pertinent Vitals/Pain Pain Assessment: 0-10 Pain Score: 8  Pain Location: R hip Pain Descriptors / Indicators: Aching;Sore Pain Intervention(s): Monitored during session;Repositioned;Ice applied    Home Living Family/patient expects to be discharged to:: Private residence Living Arrangements: Spouse/significant  other;Children Available Help at Discharge: Family Type of Home: House Home Access: Stairs to enter   Technical brewer of Steps: 5 Home Layout: One level Home Equipment: Environmental consultant - 2 wheels;Cane - single point (chair lift)      Prior Function Level of Independence: Independent               Hand Dominance        Extremity/Trunk Assessment   Upper Extremity Assessment: Overall WFL for tasks assessed           Lower Extremity Assessment: RLE deficits/detail RLE Deficits / Details: hip flex at least 2/5, moves ankle well    Cervical / Trunk Assessment: Normal  Communication   Communication: No difficulties  Cognition Arousal/Alertness: Awake/alert Behavior During Therapy: WFL for tasks assessed/performed Overall Cognitive Status: Within Functional Limits for tasks assessed                      General Comments      Exercises        Assessment/Plan    PT Assessment Patient needs continued PT services  PT Diagnosis Difficulty walking;Acute pain   PT Problem List Decreased strength;Decreased range of motion;Decreased activity tolerance;Decreased balance;Decreased mobility;Pain;Decreased knowledge of use of DME  PT Treatment Interventions DME instruction;Gait training;Functional mobility training;Therapeutic activities;Therapeutic exercise;Patient/family education   PT Goals (Current goals can be found in the Care Plan section) Acute Rehab PT Goals Patient  Stated Goal: home tomorrow.  PT Goal Formulation: With patient Time For Goal Achievement: 09/05/14 Potential to Achieve Goals: Good    Frequency BID   Barriers to discharge        Co-evaluation               End of Session   Activity Tolerance: Patient tolerated treatment well Patient left: in chair;with call bell/phone within reach;with family/visitor present           Time: 1601-0932 PT Time Calculation (min) (ACUTE ONLY): 13 min   Charges:   PT Evaluation $Initial  PT Evaluation Tier I: 1 Procedure     PT G Codes:        Weston Anna, MPT Pager: [phone]

## 2014-08-22 NOTE — Care Management Note (Signed)
Case Management Note  Patient Details  Name: Melanie Armstrong MRN: [id number] Date of Birth: 22-Nov-1955  Subjective/Objective:                   RIGHT TOTAL HIP ARTHROPLASTY ANTERIOR APPROACH (Right) Action/Plan: Discharge planning  Expected Discharge Date:                  Expected Discharge Plan:  [place]  In-House Referral:     Discharge planning Services  CM Consult  Post Acute Care Choice:  Home Health Choice offered to:  Patient  DME Arranged:  3-N-1 DME Agency:  [place]:  PT [place]:  [place]  Status of Service:  Completed, signed off  Medicare Important Message Given:    Date Medicare IM Given:    Medicare IM give by:    Date Additional Medicare IM Given:    Additional Medicare Important Message give by:     If discussed at [place] of Stay Meetings, dates discussed:    Additional Comments: CM met with pt in room to offer choice of home health agency.  Pt chooses [facility] to render HHPT.  Address and contact information verified by pt.  CM called [facility] DME rep, Kristen to please deliver 3n1 to room prior to discharge tomorrow.  Referral for HHPT emailed to [employer], Tim.  No other CM needs were communicated.   Dellie Catholic, RN 08/22/2014, 2:28 PM

## 2014-08-23 LAB — BASIC METABOLIC PANEL
Anion gap: 9 (ref 5–15)
BUN: 14 mg/dL (ref 6–20)
CO2: 27 mmol/L (ref 22–32)
Calcium: 9.2 mg/dL (ref 8.9–10.3)
Chloride: 105 mmol/L (ref 101–111)
Creatinine, Ser: 0.77 mg/dL (ref 0.44–1.00)
GFR calc Af Amer: >60 mL/min (ref >60)
GFR calc non Af Amer: >60 mL/min (ref >60)
GLUCOSE: 147 mg/dL — AB (ref 70–99)
POTASSIUM: 4.1 mmol/L (ref 3.5–5.1)
Sodium: 141 mmol/L (ref 135–145)

## 2014-08-23 LAB — CBC
HCT: 40.5 % (ref 36.0–46.0)
Hemoglobin: 12.8 g/dL (ref 12.0–15.0)
MCH: 31 pg (ref 26.0–34.0)
MCHC: 31.6 g/dL (ref 30.0–36.0)
MCV: 98.1 fL (ref 78.0–100.0)
Platelets: 221 10*3/uL (ref 150–400)
RBC: 4.13 MIL/uL (ref 3.87–5.11)
RDW: 12.4 % (ref 11.5–15.5)
WBC: 13.2 10*3/uL — ABNORMAL HIGH (ref 4.0–10.5)

## 2014-08-23 MED ORDER — ASPIRIN EC 325 MG PO TBEC: 325.0000 mg | DELAYED_RELEASE_TABLET | Freq: Two times a day (BID) | ORAL | Status: AC

## 2014-08-23 MED ORDER — RIVAROXABAN 10 MG PO TABS
10.0000 mg | ORAL_TABLET | ORAL | Status: DC
Start: 2014-08-23 — End: 2016-03-25

## 2014-08-23 NOTE — Progress Notes (Signed)
Reviewed and educated patient on discharge instructions, education, medications, and dressing information. Patient verbalizes understanding with demonstration and teach back. Patient is aware of home health agency information and has the 3 in 1 commode and appropriate equipment. No s/s of acute distress. Patient discharged to home with family and spouse.

## 2014-08-23 NOTE — Plan of Care (Signed)
Problem: Consults Goal: Diagnosis- Total Joint Replacement Outcome: Completed/Met Date Met:  08/23/14 Primary Total Hip RIGHT, Anterior

## 2014-08-23 NOTE — Evaluation (Signed)
Occupational Therapy Evaluation Patient Details Name: SHALISSA EASTERWOOD MRN: [id number] DOB: [birth year] Today's Date: 08/23/2014    History of Present Illness 59 yo female s/p R THA-direct anterior 08/22/14.    Clinical Impression   This 59 year old female was admitted for the above surgery. All education was completed.  No further OT is needed at this time.    Follow Up Recommendations  No OT follow up    Equipment Recommendations  3 in 1 bedside comode    Recommendations for Other Services       Precautions / Restrictions Precautions Precautions: Fall Restrictions Weight Bearing Restrictions: No RLE Weight Bearing: Weight bearing as tolerated      Mobility Bed Mobility         Supine to sit: Min assist     General bed mobility comments: Assist for R LE  Transfers   Equipment used: Rolling walker (2 wheeled) Transfers: Sit to/from Stand Sit to Stand: Min guard         General transfer comment: close guard for safety. VCS safety, hand placement    Balance                                            ADL Overall ADL's : Needs assistance/impaired     Grooming: Oral care;Standing;Supervision/safety   Upper Body Bathing: Set up;Sitting   Lower Body Bathing: Minimal assistance;Sit to/from stand   Upper Body Dressing : Set up;Sitting   Lower Body Dressing: Minimal assistance;Sit to/from stand;With adaptive equipment   Toilet Transfer: Minimal assistance;Comfort height toilet;Grab bars;Ambulation   Toileting- Clothing Manipulation and Hygiene: Minimal assistance;Sit to/from stand         General ADL Comments: ambulated to bathroom and performed ADL from commode.  Pt will benefit from 3:1 commode over toilet. Explained tub readiness.  Pt plans to get AE kit     Vision     Perception     Praxis      Pertinent Vitals/Pain Pain Score: 3  Pain Location: R hip Pain Descriptors / Indicators: Sore Pain Intervention(s): Limited  activity within patient's tolerance;Monitored during session;Premedicated before session;Repositioned;RN gave pain meds during session     Hand Dominance     Extremity/Trunk Assessment Upper Extremity Assessment Upper Extremity Assessment: Overall WFL for tasks assessed           Communication Communication Communication: No difficulties   Cognition Arousal/Alertness: Awake/alert Behavior During Therapy: WFL for tasks assessed/performed Overall Cognitive Status: Within Functional Limits for tasks assessed                     General Comments       Exercises       Shoulder Instructions      Home Living Family/patient expects to be discharged to:: Private residence Living Arrangements: Spouse/significant other;Children Available Help at Discharge: Family               Bathroom Shower/Tub: Teacher, early years/pre: Handicapped height     Home Equipment: Shower seat          Prior Functioning/Environment Level of Independence: Independent             OT Diagnosis: Generalized weakness   OT Problem List:     OT Treatment/Interventions:      OT Goals(Current goals can be found in the care plan  section) Acute Rehab OT Goals Patient Stated Goal: home  OT Frequency:     Barriers to D/C:            Co-evaluation              End of Session    Activity Tolerance: Patient tolerated treatment well Patient left: in chair;with call bell/phone within reach   Time: 0757-0836 OT Time Calculation (min): 39 min Charges:  OT General Charges $OT Visit: 1 Procedure OT Evaluation $Initial OT Evaluation Tier I: 1 Procedure OT Treatments $Self Care/Home Management : 8-22 mins G-Codes:    Emmarae Cowdery 2014-09-19, 8:47 AM  Lesle Chris, OTR/L [phone] 09-19-2014

## 2014-08-23 NOTE — Progress Notes (Signed)
Physical Therapy Treatment Patient Details Name: Melanie Armstrong MRN: [id number] DOB: [birth year] Today's Date: 08/23/2014    History of Present Illness 59 yo female s/p R THA-direct anterior 08/22/14.     PT Comments    Progressing with mobility. Will have a 2nd session to practice stair negotiation.   Follow Up Recommendations  Home health PT     Equipment Recommendations  None recommended by PT    Recommendations for Other Services       Precautions / Restrictions Precautions Precautions: Fall Restrictions Weight Bearing Restrictions: No RLE Weight Bearing: Weight bearing as tolerated    Mobility  Bed Mobility Overal bed mobility: Needs Assistance Bed Mobility: Sit to Supine       Sit to supine: Min assist   General bed mobility comments: Assist for R LE  Transfers Overall transfer level: Needs assistance Equipment used: Rolling walker (2 wheeled) Transfers: Sit to/from Stand Sit to Stand: Min guard         General transfer comment: close guard for safety. VCS safety, hand placement  Ambulation/Gait Ambulation/Gait assistance: Min guard Ambulation Distance (Feet): 135 Feet Assistive device: Rolling walker (2 wheeled) Gait Pattern/deviations: Step-through pattern;Decreased stride length;Decreased step length - right     General Gait Details: VCs safety, sequence. close guard for safety.   Stairs            Wheelchair Mobility    Modified Rankin (Stroke Patients Only)       Balance                                    Cognition Arousal/Alertness: Awake/alert Behavior During Therapy: WFL for tasks assessed/performed Overall Cognitive Status: Within Functional Limits for tasks assessed                      Exercises Total Joint Exercises Quad Sets: AROM;Both;10 reps;Supine Hip ABduction/ADduction: AROM;Right;10 reps;Standing Long Arc Quad: AROM;Right;10 reps;Seated Knee Flexion: AROM;Right;10  reps;Standing Marching in Standing: AROM;Both;10 reps;Standing General Exercises - Lower Extremity Heel Raises: AROM;Both;10 reps;Standing    General Comments        Pertinent Vitals/Pain Pain Assessment: 0-10 Pain Score: 4  Pain Location: R hip Pain Descriptors / Indicators: Sore Pain Intervention(s): Monitored during session;Ice applied;Repositioned    Home Living                      Prior Function            PT Goals (current goals can now be found in the care plan section) Progress towards PT goals: Progressing toward goals    Frequency  BID    PT Plan Current plan remains appropriate    Co-evaluation             End of Session Equipment Utilized During Treatment: Gait belt Activity Tolerance: Patient tolerated treatment well Patient left: in bed;with call bell/phone within reach     Time: 1050-1118 PT Time Calculation (min) (ACUTE ONLY): 28 min  Charges:  $Gait Training: 8-22 mins $Therapeutic Exercise: 8-22 mins                    G Codes:      Weston Anna, MPT Pager: [phone]

## 2014-08-23 NOTE — Progress Notes (Signed)
     Subjective: 1 Day Post-Op Procedure(s) (LRB): RIGHT TOTAL HIP ARTHROPLASTY ANTERIOR APPROACH (Right)   Patient reports pain as mild, pain controlled. No events throughout the night. Ready to be discharged home if she does well with PT and pain stays controlled.   Objective:   VITALS:   Filed Vitals:   08/23/14 0452  BP: 117/60  Pulse: 96  Temp: 97.7 F (36.5 C)  Resp: 16    Dorsiflexion/Plantar flexion intact Incision: dressing C/D/I No cellulitis present Compartment soft  LABS  Recent Labs  08/23/14 0415  HGB 12.8  HCT 40.5  WBC 13.2*  PLT 221     Recent Labs  08/23/14 0415  NA 141  K 4.1  BUN 14  CREATININE 0.77  GLUCOSE 147*     Assessment/Plan: 1 Day Post-Op Procedure(s) (LRB): RIGHT TOTAL HIP ARTHROPLASTY ANTERIOR APPROACH (Right) Foley cath d/c'ed Advance diet Up with therapy D/C IV fluids Discharge home with home health  Follow up in 2 weeks at [facility]. Follow up with OLIN,Maxi Carreras D in 2 weeks.  Contact information:  [facility] [address] [place] [phone]    Obese (BMI 30-39.9) Estimated body mass index is 39.51 kg/(m^2) as calculated from the following:   Height as of this encounter: 5\' 3"  (1.6 m).   Weight as of this encounter: 101.152 kg (223 lb). Patient also counseled that weight may inhibit the healing process Patient counseled that losing weight will help with future health issues      West Pugh. Kaylob Wallen   PAC  08/23/2014, 7:59 AM

## 2014-08-23 NOTE — Progress Notes (Signed)
Physical Therapy Treatment Patient Details Name: DAVIAN WOLLENBERG MRN: [id number] DOB: [birth year] Today's Date: 08/23/2014    History of Present Illness 59 yo female s/p R THA-direct anterior 08/22/14.     PT Comments    Progressing with mobility. All education completed . Ready for d/c from PT standpoint  Follow Up Recommendations  Home health PT     Equipment Recommendations  None recommended by PT    Recommendations for Other Services       Precautions / Restrictions Precautions Precautions: Fall Restrictions Weight Bearing Restrictions: No RLE Weight Bearing: Weight bearing as tolerated    Mobility  Bed Mobility Overal bed mobility: Needs Assistance Bed Mobility: Supine to Sit     Supine to sit: Min assist Sit to supine: Min assist   General bed mobility comments: Assist for R LE  Transfers Overall transfer level: Needs assistance Equipment used: Rolling walker (2 wheeled) Transfers: Sit to/from Stand Sit to Stand: Min guard         General transfer comment: close guard for safety. VCS safety, hand placement  Ambulation/Gait Ambulation/Gait assistance: Min guard Ambulation Distance (Feet): 150 Feet Assistive device: Rolling walker (2 wheeled) Gait Pattern/deviations: Step-through pattern;Trunk flexed;Decreased stride length     General Gait Details: VCs safety, sequence. close guard for safety.   Stairs Stairs: Yes Stairs assistance: Min guard Stair Management: Two rails;Step to pattern;Forwards Number of Stairs: 2 General stair comments: close guard for safety. VCs safety, technique, sequence  Wheelchair Mobility    Modified Rankin (Stroke Patients Only)       Balance                                    Cognition Arousal/Alertness: Awake/alert Behavior During Therapy: WFL for tasks assessed/performed Overall Cognitive Status: Within Functional Limits for tasks assessed                      Exercises T    General Comments        Pertinent Vitals/Pain Pain Assessment: 0-10 Pain Score: 4  Pain Location: R hip Pain Descriptors / Indicators: Sore Pain Intervention(s): Monitored during session    Home Living                      Prior Function            PT Goals (current goals can now be found in the care plan section) Progress towards PT goals: Progressing toward goals    Frequency  BID    PT Plan Current plan remains appropriate    Co-evaluation             End of Session Equipment Utilized During Treatment: Gait belt Activity Tolerance: Patient tolerated treatment well Patient left: in bed;with nursing/sitter in room;with call bell/phone within reach     Time: 6060-0459 PT Time Calculation (min) (ACUTE ONLY): 16 min  Charges:  $Gait Training: 8-22 mins $Therapeutic Exercise: 8-22 mins                    G Codes:      Weston Anna, MPT Pager: [phone]

## 2014-08-28 NOTE — Discharge Summary (Signed)
Physician Discharge Summary  Patient ID: Melanie Armstrong MRN: [id number] DOB/AGE: 60-15-1957 60 y.o.  Admit date: 08/22/2014 Discharge date: 08/23/2014   Procedures:  Procedure(s) (LRB): RIGHT TOTAL HIP ARTHROPLASTY ANTERIOR APPROACH (Right)  Attending Physician:  Dr. Paralee Cancel   Admission Diagnoses:   Right hip primary OA / pain  Discharge Diagnoses:  Principal Problem:   S/P right THA, AA  Past Medical History  Diagnosis Date  . Hypertension   . Heart murmur     as a child   . Asthma   . GERD (gastroesophageal reflux disease)   . Arthritis     HPI:    Melanie Armstrong, 59 y.o. female, has a history of pain and functional disability in the right hip(s) due to arthritis and patient has failed non-surgical conservative treatments for greater than 12 weeks to include NSAID's and/or analgesics, corticosteriod injections and activity modification. Onset of symptoms was gradual starting 2+ years ago with gradually worsening course since that time.The patient noted no past surgery on the right hip(s). Patient currently rates pain in the right hip at 10 out of 10 with activity. Patient has night pain, worsening of pain with activity and weight bearing, trendelenberg gait, pain that interfers with activities of daily living and pain with passive range of motion. Patient has evidence of periarticular osteophytes and joint space narrowing by imaging studies. This condition presents safety issues increasing the risk of falls. There is no current active infection. Risks, benefits and expectations were discussed with the patient. Risks including but not limited to the risk of anesthesia, blood clots, nerve damage, blood vessel damage, failure of the prosthesis, infection and up to and including death. Patient understand the risks, benefits and expectations and wishes to proceed with surgery.   PCP: [name], MD   Discharged Condition: good  Hospital Course:  Patient underwent the  above stated procedure on 08/22/2014. Patient tolerated the procedure well and brought to the recovery room in good condition and subsequently to the floor.  POD #1 BP: 117/60 ; Pulse: 96 ; Temp: 97.7 F (36.5 C) ; Resp: 16 Patient reports pain as mild, pain controlled. No events throughout the night. Ready to be discharged home. Dorsiflexion/plantar flexion intact, incision: dressing C/D/I, no cellulitis present and compartment soft.   LABS  Basename    HGB  12.8  HCT  40.5    Discharge Exam: General appearance: alert, cooperative and no distress Extremities: Homans sign is negative, no sign of DVT, no edema, redness or tenderness in the calves or thighs and no ulcers, gangrene or trophic changes  Disposition: Home with follow up in 2 weeks   Follow-up Information    Follow up with Mauri Pole, MD. Schedule an appointment as soon as possible for a visit in 2 weeks.   Specialty:  Orthopedic Surgery   Contact information:   [address] [place] [address] [phone]       Follow up with [facility].   Why:  3N1 (COMMODE)   Contact information:   [address] [place] [address] [phone]       Follow up with [facility].   Why:  home health physical therapy   Contact information:   [address] [place] [address] [phone]       Discharge Instructions    Call MD / Call 911    Complete by:  As directed   If you experience  chest pain or shortness of breath, CALL 911 and be transported to the hospital emergency room.  If you develope a fever above 101 F, pus (white drainage) or increased drainage or redness at the wound, or calf pain, call your surgeon's office.     Change dressing    Complete by:  As directed   Maintain surgical dressing until follow up in the clinic. If the edges start to pull up, may reinforce with tape. If the dressing is no longer working, may remove and cover with  gauze and tape, but must keep the area dry and clean.  Call with any questions or concerns.     Constipation Prevention    Complete by:  As directed   Drink plenty of fluids.  Prune juice may be helpful.  You may use a stool softener, such as Colace (over the counter) 100 mg twice a day.  Use MiraLax (over the counter) for constipation as needed.     Diet - low sodium heart healthy    Complete by:  As directed      Discharge instructions    Complete by:  As directed   Maintain surgical dressing until follow up in the clinic. If the edges start to pull up, may reinforce with tape. If the dressing is no longer working, may remove and cover with gauze and tape, but must keep the area dry and clean.  Follow up in 2 weeks at [facility]. Call with any questions or concerns.     Increase activity slowly as tolerated    Complete by:  As directed      TED hose    Complete by:  As directed   Use stockings (TED hose) for 2 weeks on both leg(s).  You may remove them at night for sleeping.     Weight bearing as tolerated    Complete by:  As directed   Laterality:  right  Extremity:  Lower             Medication List    STOP taking these medications        acetaminophen 650 MG CR tablet  Commonly known as:  TYLENOL     ferrous fumarate 325 (106 FE) MG Tabs tablet  Commonly known as:  HEMOCYTE - 106 mg FE     ibuprofen 200 MG tablet  Commonly known as:  ADVIL,MOTRIN      TAKE these medications        albuterol (2.5 MG/3ML) 0.083% nebulizer solution  Commonly known as:  PROVENTIL  Take 2.5 mg by nebulization every 6 (six) hours as needed for wheezing or shortness of breath.     albuterol 108 (90 BASE) MCG/ACT inhaler  Commonly known as:  PROVENTIL HFA;VENTOLIN HFA  Inhale 1 puff into the lungs every 6 (six) hours as needed for wheezing or shortness of breath.     aspirin EC 325 MG tablet  Take 1 tablet (325 mg total) by mouth 2 (two) times daily. Take for 4 weeks.      Azelastine HCl 0.15 % Soln  Place 2 sprays into the nose daily.     budesonide-formoterol 160-4.5 MCG/ACT inhaler  Commonly known as:  SYMBICORT  Inhale 2 puffs into the lungs 2 (two) times daily.     calcium-vitamin D 500-200 MG-UNIT per tablet  Commonly known as:  OSCAL WITH D  Take 1 tablet by mouth daily with breakfast.     diazepam 5 MG tablet  Commonly known as:  VALIUM  Take 5 mg by mouth every 6 (six) hours as needed for anxiety.     docusate sodium 100 MG capsule  Commonly known as:  COLACE  Take 1 capsule (100 mg total) by mouth 2 (two) times daily.     ferrous sulfate 325 (65 FE) MG tablet  Take 1 tablet (325 mg total) by mouth 3 (three) times daily after meals.     fexofenadine 180 MG tablet  Commonly known as:  ALLEGRA  Take 180 mg by mouth daily.     fluticasone 50 MCG/ACT nasal spray  Commonly known as:  FLONASE  Place 1 spray into both nostrils daily.     furosemide 20 MG tablet  Commonly known as:  LASIX  Take 20 mg by mouth every morning.     glucosamine-chondroitin 500-400 MG tablet  Take 1 tablet by mouth 2 (two) times daily.     HYDROcodone-acetaminophen 7.5-325 MG per tablet  Commonly known as:  NORCO  Take 1-2 tablets by mouth every 4 (four) hours as needed for moderate pain.     lisinopril 40 MG tablet  Commonly known as:  PRINIVIL,ZESTRIL  Take 40 mg by mouth at bedtime.     methocarbamol 500 MG tablet  Commonly known as:  ROBAXIN  Take 1 tablet (500 mg total) by mouth every 6 (six) hours as needed for muscle spasms.     montelukast 10 MG tablet  Commonly known as:  SINGULAIR  Take 10 mg by mouth at bedtime.     multivitamin with minerals Tabs tablet  Take 1 tablet by mouth daily.     OMEGA 3 PO  Take 1 capsule by mouth daily.     omeprazole 20 MG capsule  Commonly known as:  PRILOSEC  Take 20 mg by mouth daily.     polyethylene glycol packet  Commonly known as:  MIRALAX / GLYCOLAX  Take 17 g by mouth 2 (two) times daily.      PRESCRIPTION MEDICATION  once a week.     rivaroxaban 10 MG Tabs tablet  Commonly known as:  XARELTO  Take 1 tablet (10 mg total) by mouth daily.     tiotropium 18 MCG inhalation capsule  Commonly known as:  SPIRIVA  Place 18 mcg into inhaler and inhale daily.     vitamin C 500 MG tablet  Commonly known as:  ASCORBIC ACID  Take 500 mg by mouth daily.     XOLAIR [place]  Inject 1 Syringe into the skin every 30 (thirty) days.         Signed: West Pugh. Gustava Berland   PA-C  08/28/2014, 3:08 PM

## 2014-12-19 DIAGNOSIS — J309 Allergic rhinitis, unspecified: Secondary | ICD-10-CM | POA: Insufficient documentation

## 2014-12-19 DIAGNOSIS — J4550 Severe persistent asthma, uncomplicated: Secondary | ICD-10-CM

## 2014-12-19 DIAGNOSIS — K219 Gastro-esophageal reflux disease without esophagitis: Secondary | ICD-10-CM | POA: Insufficient documentation

## 2014-12-22 ENCOUNTER — Other Ambulatory Visit: Payer: Self-pay

## 2014-12-22 MED ORDER — OMALIZUMAB 150 MG ~~LOC~~ SOLR
300.0000 mg | SUBCUTANEOUS | Status: DC
  Administered 2015-01-26 – 2023-09-02 (×111): 300 mg via SUBCUTANEOUS

## 2015-01-15 ENCOUNTER — Ambulatory Visit (INDEPENDENT_AMBULATORY_CARE_PROVIDER_SITE_OTHER): Payer: BLUE CROSS/BLUE SHIELD | Admitting: *Deleted

## 2015-01-15 DIAGNOSIS — J309 Allergic rhinitis, unspecified: Secondary | ICD-10-CM

## 2015-01-22 ENCOUNTER — Ambulatory Visit (INDEPENDENT_AMBULATORY_CARE_PROVIDER_SITE_OTHER): Payer: BLUE CROSS/BLUE SHIELD | Admitting: Neurology

## 2015-01-22 DIAGNOSIS — J309 Allergic rhinitis, unspecified: Secondary | ICD-10-CM | POA: Diagnosis not present

## 2015-01-26 ENCOUNTER — Ambulatory Visit (INDEPENDENT_AMBULATORY_CARE_PROVIDER_SITE_OTHER): Payer: BLUE CROSS/BLUE SHIELD

## 2015-01-26 DIAGNOSIS — L500 Allergic urticaria: Secondary | ICD-10-CM | POA: Diagnosis not present

## 2015-01-29 ENCOUNTER — Ambulatory Visit (INDEPENDENT_AMBULATORY_CARE_PROVIDER_SITE_OTHER): Payer: BLUE CROSS/BLUE SHIELD | Admitting: Neurology

## 2015-01-29 DIAGNOSIS — J309 Allergic rhinitis, unspecified: Secondary | ICD-10-CM | POA: Diagnosis not present

## 2015-02-05 ENCOUNTER — Ambulatory Visit (INDEPENDENT_AMBULATORY_CARE_PROVIDER_SITE_OTHER): Payer: BLUE CROSS/BLUE SHIELD

## 2015-02-05 DIAGNOSIS — J309 Allergic rhinitis, unspecified: Secondary | ICD-10-CM

## 2015-02-12 ENCOUNTER — Ambulatory Visit (INDEPENDENT_AMBULATORY_CARE_PROVIDER_SITE_OTHER): Payer: BLUE CROSS/BLUE SHIELD | Admitting: Neurology

## 2015-02-12 DIAGNOSIS — J309 Allergic rhinitis, unspecified: Secondary | ICD-10-CM | POA: Diagnosis not present

## 2015-02-19 ENCOUNTER — Ambulatory Visit (INDEPENDENT_AMBULATORY_CARE_PROVIDER_SITE_OTHER): Payer: BLUE CROSS/BLUE SHIELD

## 2015-02-19 DIAGNOSIS — J309 Allergic rhinitis, unspecified: Secondary | ICD-10-CM

## 2015-02-23 ENCOUNTER — Ambulatory Visit (INDEPENDENT_AMBULATORY_CARE_PROVIDER_SITE_OTHER): Payer: BLUE CROSS/BLUE SHIELD | Admitting: *Deleted

## 2015-02-23 DIAGNOSIS — J4550 Severe persistent asthma, uncomplicated: Secondary | ICD-10-CM

## 2015-02-26 ENCOUNTER — Ambulatory Visit (INDEPENDENT_AMBULATORY_CARE_PROVIDER_SITE_OTHER): Payer: BLUE CROSS/BLUE SHIELD | Admitting: *Deleted

## 2015-02-26 DIAGNOSIS — J309 Allergic rhinitis, unspecified: Secondary | ICD-10-CM | POA: Diagnosis not present

## 2015-03-06 ENCOUNTER — Ambulatory Visit (INDEPENDENT_AMBULATORY_CARE_PROVIDER_SITE_OTHER): Payer: BLUE CROSS/BLUE SHIELD

## 2015-03-06 DIAGNOSIS — J309 Allergic rhinitis, unspecified: Secondary | ICD-10-CM

## 2015-03-12 ENCOUNTER — Ambulatory Visit (INDEPENDENT_AMBULATORY_CARE_PROVIDER_SITE_OTHER): Payer: BLUE CROSS/BLUE SHIELD

## 2015-03-12 DIAGNOSIS — J309 Allergic rhinitis, unspecified: Secondary | ICD-10-CM | POA: Diagnosis not present

## 2015-03-16 DIAGNOSIS — J3089 Other allergic rhinitis: Secondary | ICD-10-CM | POA: Diagnosis not present

## 2015-03-19 ENCOUNTER — Ambulatory Visit (INDEPENDENT_AMBULATORY_CARE_PROVIDER_SITE_OTHER): Payer: BLUE CROSS/BLUE SHIELD | Admitting: Neurology

## 2015-03-19 DIAGNOSIS — J309 Allergic rhinitis, unspecified: Secondary | ICD-10-CM

## 2015-03-22 ENCOUNTER — Other Ambulatory Visit: Payer: Self-pay | Admitting: Pediatrics

## 2015-03-23 ENCOUNTER — Ambulatory Visit (INDEPENDENT_AMBULATORY_CARE_PROVIDER_SITE_OTHER): Payer: BLUE CROSS/BLUE SHIELD | Admitting: *Deleted

## 2015-03-23 DIAGNOSIS — J4550 Severe persistent asthma, uncomplicated: Secondary | ICD-10-CM | POA: Diagnosis not present

## 2015-03-26 ENCOUNTER — Ambulatory Visit (INDEPENDENT_AMBULATORY_CARE_PROVIDER_SITE_OTHER): Payer: BLUE CROSS/BLUE SHIELD | Admitting: Neurology

## 2015-03-26 DIAGNOSIS — J309 Allergic rhinitis, unspecified: Secondary | ICD-10-CM | POA: Diagnosis not present

## 2015-04-03 ENCOUNTER — Ambulatory Visit (INDEPENDENT_AMBULATORY_CARE_PROVIDER_SITE_OTHER): Payer: BLUE CROSS/BLUE SHIELD

## 2015-04-03 DIAGNOSIS — J309 Allergic rhinitis, unspecified: Secondary | ICD-10-CM | POA: Diagnosis not present

## 2015-04-10 ENCOUNTER — Ambulatory Visit (INDEPENDENT_AMBULATORY_CARE_PROVIDER_SITE_OTHER): Payer: BLUE CROSS/BLUE SHIELD

## 2015-04-10 DIAGNOSIS — J309 Allergic rhinitis, unspecified: Secondary | ICD-10-CM

## 2015-04-17 ENCOUNTER — Ambulatory Visit (INDEPENDENT_AMBULATORY_CARE_PROVIDER_SITE_OTHER): Payer: BLUE CROSS/BLUE SHIELD

## 2015-04-17 DIAGNOSIS — J309 Allergic rhinitis, unspecified: Secondary | ICD-10-CM

## 2015-04-19 ENCOUNTER — Other Ambulatory Visit: Payer: Self-pay | Admitting: Pediatrics

## 2015-04-20 ENCOUNTER — Ambulatory Visit (INDEPENDENT_AMBULATORY_CARE_PROVIDER_SITE_OTHER): Payer: BLUE CROSS/BLUE SHIELD | Admitting: Neurology

## 2015-04-20 DIAGNOSIS — J4540 Moderate persistent asthma, uncomplicated: Secondary | ICD-10-CM | POA: Diagnosis not present

## 2015-04-25 ENCOUNTER — Ambulatory Visit (INDEPENDENT_AMBULATORY_CARE_PROVIDER_SITE_OTHER): Payer: BLUE CROSS/BLUE SHIELD

## 2015-04-25 DIAGNOSIS — J309 Allergic rhinitis, unspecified: Secondary | ICD-10-CM | POA: Diagnosis not present

## 2015-04-30 ENCOUNTER — Ambulatory Visit (INDEPENDENT_AMBULATORY_CARE_PROVIDER_SITE_OTHER): Payer: BLUE CROSS/BLUE SHIELD

## 2015-04-30 DIAGNOSIS — J309 Allergic rhinitis, unspecified: Secondary | ICD-10-CM

## 2015-05-07 ENCOUNTER — Ambulatory Visit (INDEPENDENT_AMBULATORY_CARE_PROVIDER_SITE_OTHER): Payer: BLUE CROSS/BLUE SHIELD | Admitting: *Deleted

## 2015-05-07 DIAGNOSIS — J309 Allergic rhinitis, unspecified: Secondary | ICD-10-CM | POA: Diagnosis not present

## 2015-05-14 ENCOUNTER — Ambulatory Visit (INDEPENDENT_AMBULATORY_CARE_PROVIDER_SITE_OTHER): Payer: BLUE CROSS/BLUE SHIELD

## 2015-05-14 DIAGNOSIS — J309 Allergic rhinitis, unspecified: Secondary | ICD-10-CM

## 2015-05-18 DIAGNOSIS — J3089 Other allergic rhinitis: Secondary | ICD-10-CM | POA: Diagnosis not present

## 2015-05-21 ENCOUNTER — Ambulatory Visit: Payer: BLUE CROSS/BLUE SHIELD | Admitting: Pediatrics

## 2015-05-24 ENCOUNTER — Ambulatory Visit (INDEPENDENT_AMBULATORY_CARE_PROVIDER_SITE_OTHER): Payer: BLUE CROSS/BLUE SHIELD

## 2015-05-24 DIAGNOSIS — J309 Allergic rhinitis, unspecified: Secondary | ICD-10-CM | POA: Diagnosis not present

## 2015-05-25 ENCOUNTER — Ambulatory Visit (INDEPENDENT_AMBULATORY_CARE_PROVIDER_SITE_OTHER): Payer: BLUE CROSS/BLUE SHIELD | Admitting: Neurology

## 2015-05-25 DIAGNOSIS — J4540 Moderate persistent asthma, uncomplicated: Secondary | ICD-10-CM

## 2015-05-28 ENCOUNTER — Encounter: Payer: Self-pay | Admitting: Pediatrics

## 2015-05-28 ENCOUNTER — Ambulatory Visit (INDEPENDENT_AMBULATORY_CARE_PROVIDER_SITE_OTHER): Payer: BLUE CROSS/BLUE SHIELD

## 2015-05-28 ENCOUNTER — Ambulatory Visit (INDEPENDENT_AMBULATORY_CARE_PROVIDER_SITE_OTHER): Payer: BLUE CROSS/BLUE SHIELD | Admitting: Pediatrics

## 2015-05-28 VITALS — BP 120/82 | HR 80 | Temp 97.8°F | Resp 20 | Ht 62.99 in | Wt 229.5 lb

## 2015-05-28 DIAGNOSIS — I10 Essential (primary) hypertension: Secondary | ICD-10-CM

## 2015-05-28 DIAGNOSIS — J309 Allergic rhinitis, unspecified: Secondary | ICD-10-CM

## 2015-05-28 DIAGNOSIS — J3089 Other allergic rhinitis: Secondary | ICD-10-CM | POA: Diagnosis not present

## 2015-05-28 DIAGNOSIS — J4550 Severe persistent asthma, uncomplicated: Secondary | ICD-10-CM

## 2015-05-28 DIAGNOSIS — K219 Gastro-esophageal reflux disease without esophagitis: Secondary | ICD-10-CM

## 2015-05-28 MED ORDER — TIOTROPIUM BROMIDE MONOHYDRATE 18 MCG IN CAPS: ORAL_CAPSULE | RESPIRATORY_TRACT | Status: DC

## 2015-05-28 MED ORDER — BUDESONIDE-FORMOTEROL FUMARATE 160-4.5 MCG/ACT IN AERO: 2.0000 | INHALATION_SPRAY | Freq: Two times a day (BID) | RESPIRATORY_TRACT | Status: DC

## 2015-05-28 MED ORDER — ALBUTEROL SULFATE (2.5 MG/3ML) 0.083% IN NEBU: 2.5000 mg | INHALATION_SOLUTION | RESPIRATORY_TRACT | Status: DC | PRN

## 2015-05-28 MED ORDER — FLUTICASONE PROPIONATE 50 MCG/ACT NA SUSP: 1.0000 | Freq: Every day | NASAL | Status: DC

## 2015-05-28 MED ORDER — EPINEPHRINE 0.3 MG/0.3ML IJ SOAJ: INTRAMUSCULAR | Status: DC

## 2015-05-28 MED ORDER — ALBUTEROL SULFATE HFA 108 (90 BASE) MCG/ACT IN AERS: 2.0000 | INHALATION_SPRAY | RESPIRATORY_TRACT | Status: DC | PRN

## 2015-05-28 NOTE — Patient Instructions (Addendum)
Continue on the treatment plan outlined above Call me if you're not doing well on this treatment plan

## 2015-05-28 NOTE — Progress Notes (Signed)
[address] [place] [address] Dept: [phone]  FOLLOW UP NOTE  Patient ID: Melanie Armstrong, female    DOB: Dec 04, 1955  Age: 60 y.o. MRN: [id number] Date of Office Visit: 05/28/2015  Assessment Chief Complaint: Follow-up  HPI Melanie Armstrong presents for  follow-up of asthma and allergic rhinitis.. She is on Xolair injections every 4 weeks and has had an excellent response. Her nasal symptoms are under control. She has had a hip replacement.  Current medications are Spiriva 18 MCG's one puff once a day, Pro-air 2 puffs every 4 hours if needed, azelastine 0.15% 2 sprays per nostril at night, Symbicort 160-4.5 -2 puffs twice a day, fexofenadine 180 mg once a day if needed, fluticasone 2 sprays per nostril once a day if needed, montelukast 10 mg once a day. Her other medications are outlined in the chart   Drug Allergies:  Allergies  Allergen Reactions  . Avelox [Moxifloxacin Hcl In Nacl] Other (See Comments)    Muscle Aches  . Levofloxacin Other (See Comments)    Tendon pain    Physical Exam: BP 120/82 mmHg  Pulse 80  Temp(Src) 97.8 F (36.6 C) (Oral)  Resp 20  Ht 5' 2.99" (1.6 m)  Wt 229 lb 8 oz (104.1 kg)  BMI 40.66 kg/m2   Physical Exam  Constitutional: She is oriented to person, place, and time. She appears well-developed and well-nourished.  HENT:  Eyes normal. Ears normal. Nose normal. Pharynx normal.  Neck: Neck supple.  Cardiovascular:  S1 and S2 normal no murmurs  Pulmonary/Chest:  Clear to percussion and auscultation  Lymphadenopathy:    She has no cervical adenopathy.  Neurological: She is alert and oriented to person, place, and time.  Psychiatric: She has a normal mood and affect. Her behavior is normal. Judgment and thought content normal.    Diagnostics:  FVC 2.74 L FEV1 2.46 L. Predicted FVC 3.18 L predicted FEV1 2.46 L-spirometry in the normal range  Assessment and Plan: 1. Severe persistent asthma, uncomplicated   2. Other allergic  rhinitis   3. Essential hypertension   4. Gastroesophageal reflux disease without esophagitis     Meds ordered this encounter  Medications  . tiotropium (SPIRIVA HANDIHALER) 18 MCG inhalation capsule    Sig: INHALE 1 PUFF BY MOUTH EVERY MORNING TO PREVENT COUGH OR WHEEZE    Dispense:  30 capsule    Refill:  5  . budesonide-formoterol (SYMBICORT) 160-4.5 MCG/ACT inhaler    Sig: Inhale 2 puffs into the lungs 2 (two) times daily.    Dispense:  1 Inhaler    Refill:  5  . EPINEPHrine (EPIPEN 2-PAK) 0.3 mg/0.3 mL IJ SOAJ injection    Sig: USE AS DIRECTED FOR SEVERE ALLERGIC REACTION    Dispense:  2 Device    Refill:  1  . albuterol (PROAIR HFA) 108 (90 Base) MCG/ACT inhaler    Sig: Inhale 2 puffs into the lungs every 4 (four) hours as needed for wheezing or shortness of breath.    Dispense:  1 Inhaler    Refill:  1  . fluticasone (FLONASE) 50 MCG/ACT nasal spray    Sig: Place 1 spray into both nostrils daily.    Dispense:  16 g    Refill:  5  . albuterol (PROVENTIL) (2.5 MG/3ML) 0.083% nebulizer solution    Sig: Take 3 mLs (2.5 mg total) by nebulization every 4 (four) hours as needed for wheezing or shortness of breath.    Dispense:  225 mL  Refill:  1    Patient Instructions  Continue on the treatment plan outlined above Call me if you're not doing well on this treatment plan    Return in about 6 months (around 11/25/2015).    Thank you for the opportunity to care for this patient.  Please do not hesitate to contact me with questions.  Penne Lash, M.D.  [facility] [address] [place], [place] [address] [phone]

## 2015-06-04 ENCOUNTER — Other Ambulatory Visit: Payer: Self-pay | Admitting: Allergy and Immunology

## 2015-06-04 ENCOUNTER — Ambulatory Visit (INDEPENDENT_AMBULATORY_CARE_PROVIDER_SITE_OTHER): Payer: BLUE CROSS/BLUE SHIELD

## 2015-06-04 DIAGNOSIS — J309 Allergic rhinitis, unspecified: Secondary | ICD-10-CM | POA: Diagnosis not present

## 2015-06-11 ENCOUNTER — Ambulatory Visit (INDEPENDENT_AMBULATORY_CARE_PROVIDER_SITE_OTHER): Payer: BLUE CROSS/BLUE SHIELD

## 2015-06-11 DIAGNOSIS — J309 Allergic rhinitis, unspecified: Secondary | ICD-10-CM | POA: Diagnosis not present

## 2015-06-18 ENCOUNTER — Ambulatory Visit (INDEPENDENT_AMBULATORY_CARE_PROVIDER_SITE_OTHER): Payer: BLUE CROSS/BLUE SHIELD

## 2015-06-18 DIAGNOSIS — J309 Allergic rhinitis, unspecified: Secondary | ICD-10-CM

## 2015-06-25 ENCOUNTER — Ambulatory Visit (INDEPENDENT_AMBULATORY_CARE_PROVIDER_SITE_OTHER): Payer: BLUE CROSS/BLUE SHIELD

## 2015-06-25 DIAGNOSIS — J309 Allergic rhinitis, unspecified: Secondary | ICD-10-CM

## 2015-06-27 ENCOUNTER — Ambulatory Visit (INDEPENDENT_AMBULATORY_CARE_PROVIDER_SITE_OTHER): Payer: BLUE CROSS/BLUE SHIELD

## 2015-06-27 DIAGNOSIS — J4540 Moderate persistent asthma, uncomplicated: Secondary | ICD-10-CM

## 2015-07-02 ENCOUNTER — Ambulatory Visit (INDEPENDENT_AMBULATORY_CARE_PROVIDER_SITE_OTHER): Payer: BLUE CROSS/BLUE SHIELD

## 2015-07-02 DIAGNOSIS — J309 Allergic rhinitis, unspecified: Secondary | ICD-10-CM | POA: Diagnosis not present

## 2015-07-09 ENCOUNTER — Ambulatory Visit (INDEPENDENT_AMBULATORY_CARE_PROVIDER_SITE_OTHER): Payer: BLUE CROSS/BLUE SHIELD

## 2015-07-09 DIAGNOSIS — J309 Allergic rhinitis, unspecified: Secondary | ICD-10-CM

## 2015-07-16 ENCOUNTER — Ambulatory Visit (INDEPENDENT_AMBULATORY_CARE_PROVIDER_SITE_OTHER): Payer: BLUE CROSS/BLUE SHIELD

## 2015-07-16 DIAGNOSIS — J309 Allergic rhinitis, unspecified: Secondary | ICD-10-CM | POA: Diagnosis not present

## 2015-07-18 DIAGNOSIS — J4550 Severe persistent asthma, uncomplicated: Secondary | ICD-10-CM | POA: Diagnosis not present

## 2015-07-18 DIAGNOSIS — Z471 Aftercare following joint replacement surgery: Secondary | ICD-10-CM | POA: Diagnosis not present

## 2015-07-18 DIAGNOSIS — Z96641 Presence of right artificial hip joint: Secondary | ICD-10-CM | POA: Diagnosis not present

## 2015-07-20 ENCOUNTER — Ambulatory Visit (INDEPENDENT_AMBULATORY_CARE_PROVIDER_SITE_OTHER): Payer: BLUE CROSS/BLUE SHIELD | Admitting: *Deleted

## 2015-07-20 DIAGNOSIS — J4520 Mild intermittent asthma, uncomplicated: Secondary | ICD-10-CM | POA: Diagnosis not present

## 2015-07-20 DIAGNOSIS — M1990 Unspecified osteoarthritis, unspecified site: Secondary | ICD-10-CM | POA: Diagnosis not present

## 2015-07-20 DIAGNOSIS — J4540 Moderate persistent asthma, uncomplicated: Secondary | ICD-10-CM | POA: Diagnosis not present

## 2015-07-20 DIAGNOSIS — I10 Essential (primary) hypertension: Secondary | ICD-10-CM | POA: Diagnosis not present

## 2015-07-20 DIAGNOSIS — J309 Allergic rhinitis, unspecified: Secondary | ICD-10-CM | POA: Diagnosis not present

## 2015-07-23 ENCOUNTER — Ambulatory Visit (INDEPENDENT_AMBULATORY_CARE_PROVIDER_SITE_OTHER): Payer: BLUE CROSS/BLUE SHIELD

## 2015-07-23 DIAGNOSIS — J309 Allergic rhinitis, unspecified: Secondary | ICD-10-CM

## 2015-07-24 DIAGNOSIS — J3089 Other allergic rhinitis: Secondary | ICD-10-CM | POA: Diagnosis not present

## 2015-07-30 ENCOUNTER — Ambulatory Visit (INDEPENDENT_AMBULATORY_CARE_PROVIDER_SITE_OTHER): Payer: BLUE CROSS/BLUE SHIELD

## 2015-07-30 DIAGNOSIS — J309 Allergic rhinitis, unspecified: Secondary | ICD-10-CM | POA: Diagnosis not present

## 2015-08-06 ENCOUNTER — Ambulatory Visit (INDEPENDENT_AMBULATORY_CARE_PROVIDER_SITE_OTHER): Payer: BLUE CROSS/BLUE SHIELD

## 2015-08-06 DIAGNOSIS — J309 Allergic rhinitis, unspecified: Secondary | ICD-10-CM

## 2015-08-13 ENCOUNTER — Ambulatory Visit (INDEPENDENT_AMBULATORY_CARE_PROVIDER_SITE_OTHER): Payer: BLUE CROSS/BLUE SHIELD

## 2015-08-13 DIAGNOSIS — J309 Allergic rhinitis, unspecified: Secondary | ICD-10-CM

## 2015-08-17 ENCOUNTER — Ambulatory Visit (INDEPENDENT_AMBULATORY_CARE_PROVIDER_SITE_OTHER): Payer: BLUE CROSS/BLUE SHIELD | Admitting: *Deleted

## 2015-08-17 DIAGNOSIS — J4540 Moderate persistent asthma, uncomplicated: Secondary | ICD-10-CM | POA: Diagnosis not present

## 2015-08-20 ENCOUNTER — Ambulatory Visit (INDEPENDENT_AMBULATORY_CARE_PROVIDER_SITE_OTHER): Payer: BLUE CROSS/BLUE SHIELD | Admitting: *Deleted

## 2015-08-20 DIAGNOSIS — J309 Allergic rhinitis, unspecified: Secondary | ICD-10-CM | POA: Diagnosis not present

## 2015-08-27 ENCOUNTER — Ambulatory Visit (INDEPENDENT_AMBULATORY_CARE_PROVIDER_SITE_OTHER): Payer: BLUE CROSS/BLUE SHIELD | Admitting: *Deleted

## 2015-08-27 DIAGNOSIS — J309 Allergic rhinitis, unspecified: Secondary | ICD-10-CM

## 2015-08-30 DIAGNOSIS — Z96641 Presence of right artificial hip joint: Secondary | ICD-10-CM | POA: Diagnosis not present

## 2015-08-30 DIAGNOSIS — Z471 Aftercare following joint replacement surgery: Secondary | ICD-10-CM | POA: Diagnosis not present

## 2015-09-03 ENCOUNTER — Ambulatory Visit (INDEPENDENT_AMBULATORY_CARE_PROVIDER_SITE_OTHER): Payer: BLUE CROSS/BLUE SHIELD

## 2015-09-03 DIAGNOSIS — J309 Allergic rhinitis, unspecified: Secondary | ICD-10-CM | POA: Diagnosis not present

## 2015-09-11 ENCOUNTER — Ambulatory Visit (INDEPENDENT_AMBULATORY_CARE_PROVIDER_SITE_OTHER): Payer: BLUE CROSS/BLUE SHIELD | Admitting: *Deleted

## 2015-09-11 DIAGNOSIS — J309 Allergic rhinitis, unspecified: Secondary | ICD-10-CM | POA: Diagnosis not present

## 2015-09-17 ENCOUNTER — Ambulatory Visit (INDEPENDENT_AMBULATORY_CARE_PROVIDER_SITE_OTHER): Payer: BLUE CROSS/BLUE SHIELD

## 2015-09-17 DIAGNOSIS — J309 Allergic rhinitis, unspecified: Secondary | ICD-10-CM | POA: Diagnosis not present

## 2015-09-19 ENCOUNTER — Ambulatory Visit (INDEPENDENT_AMBULATORY_CARE_PROVIDER_SITE_OTHER): Payer: BLUE CROSS/BLUE SHIELD | Admitting: *Deleted

## 2015-09-19 DIAGNOSIS — J4540 Moderate persistent asthma, uncomplicated: Secondary | ICD-10-CM

## 2015-09-24 ENCOUNTER — Ambulatory Visit (INDEPENDENT_AMBULATORY_CARE_PROVIDER_SITE_OTHER): Payer: BLUE CROSS/BLUE SHIELD

## 2015-09-24 DIAGNOSIS — J309 Allergic rhinitis, unspecified: Secondary | ICD-10-CM

## 2015-09-25 DIAGNOSIS — J209 Acute bronchitis, unspecified: Secondary | ICD-10-CM | POA: Diagnosis not present

## 2015-09-25 DIAGNOSIS — H6691 Otitis media, unspecified, right ear: Secondary | ICD-10-CM | POA: Diagnosis not present

## 2015-10-01 ENCOUNTER — Ambulatory Visit (INDEPENDENT_AMBULATORY_CARE_PROVIDER_SITE_OTHER): Payer: BLUE CROSS/BLUE SHIELD | Admitting: *Deleted

## 2015-10-01 DIAGNOSIS — J309 Allergic rhinitis, unspecified: Secondary | ICD-10-CM

## 2015-10-04 DIAGNOSIS — J3089 Other allergic rhinitis: Secondary | ICD-10-CM | POA: Diagnosis not present

## 2015-10-08 ENCOUNTER — Ambulatory Visit (INDEPENDENT_AMBULATORY_CARE_PROVIDER_SITE_OTHER): Payer: BLUE CROSS/BLUE SHIELD | Admitting: *Deleted

## 2015-10-08 DIAGNOSIS — J309 Allergic rhinitis, unspecified: Secondary | ICD-10-CM

## 2015-10-09 ENCOUNTER — Other Ambulatory Visit: Payer: Self-pay | Admitting: Pediatrics

## 2015-10-12 DIAGNOSIS — M1612 Unilateral primary osteoarthritis, left hip: Secondary | ICD-10-CM | POA: Diagnosis not present

## 2015-10-12 DIAGNOSIS — J4550 Severe persistent asthma, uncomplicated: Secondary | ICD-10-CM | POA: Diagnosis not present

## 2015-10-17 ENCOUNTER — Ambulatory Visit (INDEPENDENT_AMBULATORY_CARE_PROVIDER_SITE_OTHER): Payer: BLUE CROSS/BLUE SHIELD

## 2015-10-17 DIAGNOSIS — J309 Allergic rhinitis, unspecified: Secondary | ICD-10-CM

## 2015-10-18 DIAGNOSIS — M1612 Unilateral primary osteoarthritis, left hip: Secondary | ICD-10-CM | POA: Diagnosis not present

## 2015-10-22 ENCOUNTER — Ambulatory Visit (INDEPENDENT_AMBULATORY_CARE_PROVIDER_SITE_OTHER): Payer: BLUE CROSS/BLUE SHIELD | Admitting: *Deleted

## 2015-10-22 DIAGNOSIS — J309 Allergic rhinitis, unspecified: Secondary | ICD-10-CM | POA: Diagnosis not present

## 2015-10-24 ENCOUNTER — Ambulatory Visit (INDEPENDENT_AMBULATORY_CARE_PROVIDER_SITE_OTHER): Payer: BLUE CROSS/BLUE SHIELD

## 2015-10-24 DIAGNOSIS — J4540 Moderate persistent asthma, uncomplicated: Secondary | ICD-10-CM

## 2015-10-29 ENCOUNTER — Ambulatory Visit (INDEPENDENT_AMBULATORY_CARE_PROVIDER_SITE_OTHER): Payer: BLUE CROSS/BLUE SHIELD | Admitting: *Deleted

## 2015-10-29 DIAGNOSIS — J309 Allergic rhinitis, unspecified: Secondary | ICD-10-CM

## 2015-10-30 DIAGNOSIS — Z01419 Encounter for gynecological examination (general) (routine) without abnormal findings: Secondary | ICD-10-CM | POA: Diagnosis not present

## 2015-10-30 DIAGNOSIS — Z6838 Body mass index (BMI) 38.0-38.9, adult: Secondary | ICD-10-CM | POA: Diagnosis not present

## 2015-10-30 DIAGNOSIS — Z1231 Encounter for screening mammogram for malignant neoplasm of breast: Secondary | ICD-10-CM | POA: Diagnosis not present

## 2015-10-30 DIAGNOSIS — Z124 Encounter for screening for malignant neoplasm of cervix: Secondary | ICD-10-CM | POA: Diagnosis not present

## 2015-11-05 ENCOUNTER — Ambulatory Visit (INDEPENDENT_AMBULATORY_CARE_PROVIDER_SITE_OTHER): Payer: BLUE CROSS/BLUE SHIELD | Admitting: *Deleted

## 2015-11-05 DIAGNOSIS — J309 Allergic rhinitis, unspecified: Secondary | ICD-10-CM | POA: Diagnosis not present

## 2015-11-12 ENCOUNTER — Ambulatory Visit (INDEPENDENT_AMBULATORY_CARE_PROVIDER_SITE_OTHER): Payer: BLUE CROSS/BLUE SHIELD

## 2015-11-12 DIAGNOSIS — J309 Allergic rhinitis, unspecified: Secondary | ICD-10-CM

## 2015-11-13 DIAGNOSIS — J4552 Severe persistent asthma with status asthmaticus: Secondary | ICD-10-CM | POA: Diagnosis not present

## 2015-11-13 DIAGNOSIS — J4550 Severe persistent asthma, uncomplicated: Secondary | ICD-10-CM | POA: Diagnosis not present

## 2015-11-19 ENCOUNTER — Ambulatory Visit (INDEPENDENT_AMBULATORY_CARE_PROVIDER_SITE_OTHER): Payer: BLUE CROSS/BLUE SHIELD | Admitting: *Deleted

## 2015-11-19 DIAGNOSIS — J309 Allergic rhinitis, unspecified: Secondary | ICD-10-CM | POA: Diagnosis not present

## 2015-11-21 ENCOUNTER — Ambulatory Visit (INDEPENDENT_AMBULATORY_CARE_PROVIDER_SITE_OTHER): Payer: BLUE CROSS/BLUE SHIELD | Admitting: *Deleted

## 2015-11-21 DIAGNOSIS — J4540 Moderate persistent asthma, uncomplicated: Secondary | ICD-10-CM

## 2015-11-26 ENCOUNTER — Ambulatory Visit: Payer: BLUE CROSS/BLUE SHIELD | Admitting: Pediatrics

## 2015-11-26 ENCOUNTER — Encounter: Payer: Self-pay | Admitting: Allergy and Immunology

## 2015-11-26 ENCOUNTER — Ambulatory Visit (INDEPENDENT_AMBULATORY_CARE_PROVIDER_SITE_OTHER): Payer: BLUE CROSS/BLUE SHIELD | Admitting: Allergy and Immunology

## 2015-11-26 ENCOUNTER — Ambulatory Visit: Payer: BLUE CROSS/BLUE SHIELD | Admitting: Allergy and Immunology

## 2015-11-26 VITALS — BP 130/74 | HR 86 | Temp 98.2°F | Resp 16

## 2015-11-26 DIAGNOSIS — K219 Gastro-esophageal reflux disease without esophagitis: Secondary | ICD-10-CM | POA: Diagnosis not present

## 2015-11-26 DIAGNOSIS — J3089 Other allergic rhinitis: Secondary | ICD-10-CM

## 2015-11-26 DIAGNOSIS — J4550 Severe persistent asthma, uncomplicated: Secondary | ICD-10-CM | POA: Diagnosis not present

## 2015-11-26 DIAGNOSIS — J4552 Severe persistent asthma with status asthmaticus: Secondary | ICD-10-CM | POA: Diagnosis not present

## 2015-11-26 MED ORDER — TIOTROPIUM BROMIDE MONOHYDRATE 18 MCG IN CAPS: ORAL_CAPSULE | RESPIRATORY_TRACT | 5 refills | Status: DC

## 2015-11-26 MED ORDER — MONTELUKAST SODIUM 10 MG PO TABS: 10.0000 mg | ORAL_TABLET | Freq: Every day | ORAL | 5 refills | Status: DC

## 2015-11-26 MED ORDER — BUDESONIDE-FORMOTEROL FUMARATE 160-4.5 MCG/ACT IN AERO: 2.0000 | INHALATION_SPRAY | Freq: Two times a day (BID) | RESPIRATORY_TRACT | 5 refills | Status: DC

## 2015-11-26 MED ORDER — OMEPRAZOLE 20 MG PO CPDR: 20.0000 mg | DELAYED_RELEASE_CAPSULE | Freq: Every day | ORAL | 5 refills | Status: AC

## 2015-11-26 MED ORDER — FLUTICASONE PROPIONATE 50 MCG/ACT NA SUSP
1.0000 | Freq: Every day | NASAL | 5 refills | Status: DC | PRN
Start: 2015-11-26 — End: 2018-06-21

## 2015-11-26 NOTE — Assessment & Plan Note (Signed)
   Continue appropriate allergen avoidance measures and fluticasone nasal spray as needed.  I have also recommended nasal saline spray (i.e., Simply Saline) or nasal saline lavage (i.e., NeilMed) as needed prior to medicated nasal sprays.

## 2015-11-26 NOTE — Assessment & Plan Note (Signed)
   For now, continue omalizumab injections monthly, Symbicort 160/4.5 g 2 inhalations twice a day, Spiriva 18 g daily, montelukast 10 mg daily, and albuterol every 4-6 hours as needed.  To maximize pulmonary deposition, a spacer has been provided along with instructions for its proper administration with an HFA inhaler.  Subjective and objective measures of pulmonary function will be followed and the treatment plan will be adjusted accordingly.

## 2015-11-26 NOTE — Assessment & Plan Note (Signed)
   Continue appropriate reflux lifestyle modifications and omeprazole as previously prescribed. 

## 2015-11-26 NOTE — Progress Notes (Signed)
Follow-up Note  RE: Melanie Armstrong MRN: [id number] DOB: April 03, 1956 Date of Office Visit: 11/26/2015  Primary care provider: [name], MD Referring provider: [name], MD  History of present illness: Melanie Armstrong is a 60 y.o. female persistent asthma, allergic rhinitis, and gastroesophageal reflux disease presenting today for follow up.  She was last seen in this clinic 01/25/2016 by Dr. Shaune Leeks.  She reports that her asthma symptoms have improved with omalizumab injections once a month, Symbicort 160/4.5 g, 2 inhalations twice a day, Spiriva 18 g daily, and montelukast 10 mg daily at bedtime.  However, she reports that she still experiences asthma symptoms rather frequently due to exposure to strong aromas, such as perfumes and colognes, while at work.  In addition, she experiences occasional nasal congestion with strong aromas as well as with humidity.  Her acid reflux is currently well-controlled with omeprazole 20 mg daily.     Assessment and plan: Severe persistent asthma  For now, continue omalizumab injections monthly, Symbicort 160/4.5 g 2 inhalations twice a day, Spiriva 18 g daily, montelukast 10 mg daily, and albuterol every 4-6 hours as needed.  To maximize pulmonary deposition, a spacer has been provided along with instructions for its proper administration with an HFA inhaler.  Subjective and objective measures of pulmonary function will be followed and the treatment plan will be adjusted accordingly.  Other allergic rhinitis  Continue appropriate allergen avoidance measures and fluticasone nasal spray as needed.  I have also recommended nasal saline spray (i.e., Simply Saline) or nasal saline lavage (i.e., NeilMed) as needed prior to medicated nasal sprays.  GERD (gastroesophageal reflux disease)  Continue appropriate reflux lifestyle modifications and omeprazole as previously prescribed.   Meds ordered this encounter  Medications  .  budesonide-formoterol (SYMBICORT) 160-4.5 MCG/ACT inhaler    Sig: Inhale 2 puffs into the lungs 2 (two) times daily.    Dispense:  1 Inhaler    Refill:  5  . montelukast (SINGULAIR) 10 MG tablet    Sig: Take 1 tablet (10 mg total) by mouth at bedtime.    Dispense:  30 tablet    Refill:  5  . tiotropium (SPIRIVA HANDIHALER) 18 MCG inhalation capsule    Sig: INHALE 1 PUFF BY MOUTH EVERY MORNING TO PREVENT COUGH OR WHEEZE    Dispense:  30 capsule    Refill:  5  . omeprazole (PRILOSEC) 20 MG capsule    Sig: Take 1 capsule (20 mg total) by mouth daily.    Dispense:  30 capsule    Refill:  5  . fluticasone (FLONASE) 50 MCG/ACT nasal spray    Sig: Place 1-2 sprays into both nostrils daily as needed for allergies or rhinitis.    Dispense:  16 g    Refill:  5    Diagnositics: Spirometry:  Normal with an FEV1 of 107% predicted.  Please see scanned spirometry results for details.    Physical examination: Blood pressure 130/74, pulse 86, temperature 98.2 F (36.8 C), temperature source Oral, resp. rate 16, SpO2 93 %.  General: Alert, interactive, in no acute distress. HEENT: TMs pearly gray, turbinates mildly edematous without discharge, post-pharynx mildly erythematous. Neck: Supple without lymphadenopathy. Lungs: Clear to auscultation without wheezing, rhonchi or rales. CV: Normal S1, S2 without murmurs. Skin: Warm and dry, without lesions or rashes.  The following portions of the patient's history were reviewed and updated as appropriate: allergies, current medications, past family history, past medical history, past social history, past surgical history and problem  list.    Medication List       Accurate as of 11/26/15  5:45 PM. Always use your most recent med list.          albuterol 108 (90 Base) MCG/ACT inhaler Commonly known as:  PROAIR HFA Inhale 2 puffs into the lungs every 4 (four) hours as needed for wheezing or shortness of breath.   albuterol (2.5 MG/3ML) 0.083%  nebulizer solution Commonly known as:  PROVENTIL Take 3 mLs (2.5 mg total) by nebulization every 4 (four) hours as needed for wheezing or shortness of breath.   Azelastine HCl 0.15 % Soln Place 2 sprays into the nose daily.   budesonide-formoterol 160-4.5 MCG/ACT inhaler Commonly known as:  SYMBICORT Inhale 2 puffs into the lungs 2 (two) times daily.   calcium-vitamin D 500-200 MG-UNIT tablet Commonly known as:  OSCAL WITH D Take 1 tablet by mouth daily with breakfast.   diazepam 5 MG tablet Commonly known as:  VALIUM Take 5 mg by mouth every 6 (six) hours as needed for anxiety.   EPINEPHrine 0.3 mg/0.3 mL Soaj injection Commonly known as:  EPIPEN 2-PAK USE AS DIRECTED FOR SEVERE ALLERGIC REACTION   ferrous sulfate 325 (65 FE) MG tablet Take 1 tablet (325 mg total) by mouth 3 (three) times daily after meals.   fexofenadine 180 MG tablet Commonly known as:  ALLEGRA Take 180 mg by mouth daily.   fluticasone 50 MCG/ACT nasal spray Commonly known as:  FLONASE Place 1-2 sprays into both nostrils daily as needed for allergies or rhinitis.   furosemide 20 MG tablet Commonly known as:  LASIX Take 20 mg by mouth every morning.   glucosamine-chondroitin 500-400 MG tablet Take 1 tablet by mouth 2 (two) times daily.   lisinopril 40 MG tablet Commonly known as:  PRINIVIL,ZESTRIL Take 40 mg by mouth at bedtime.   montelukast 10 MG tablet Commonly known as:  SINGULAIR Take 1 tablet (10 mg total) by mouth at bedtime.   multivitamin with minerals Tabs tablet Take 1 tablet by mouth daily.   OMEGA 3 PO Take 1 capsule by mouth daily.   omeprazole 20 MG capsule Commonly known as:  PRILOSEC Take 1 capsule (20 mg total) by mouth daily.   PRESCRIPTION MEDICATION once a week.   rivaroxaban 10 MG Tabs tablet Commonly known as:  XARELTO Take 1 tablet (10 mg total) by mouth daily.   tiotropium 18 MCG inhalation capsule Commonly known as:  SPIRIVA HANDIHALER INHALE 1 PUFF BY  MOUTH EVERY MORNING TO PREVENT COUGH OR WHEEZE   vitamin C 500 MG tablet Commonly known as:  ASCORBIC ACID Take 500 mg by mouth daily.   XOLAIR 150 MG injection Generic drug:  omalizumab INJECT 300 MG SUBCUTANEOUSLY EVERY 4 WEEKS -REFRIGERATE DO NOT FREEZE       Allergies  Allergen Reactions  . Avelox [Moxifloxacin Hcl In Nacl] Other (See Comments)    Muscle Aches  . Levofloxacin Other (See Comments)    Tendon pain    I appreciate the opportunity to take part in [facility]'s care. Please do not hesitate to contact me with questions.  Sincerely,   R. Edgar Frisk, MD

## 2015-11-26 NOTE — Patient Instructions (Addendum)
Severe persistent asthma  For now, continue omalizumab injections monthly, Symbicort 160/4.5 g 2 inhalations twice a day, Spiriva 18 g daily, montelukast 10 mg daily, and albuterol every 4-6 hours as needed.  To maximize pulmonary deposition, a spacer has been provided along with instructions for its proper administration with an HFA inhaler.  Subjective and objective measures of pulmonary function will be followed and the treatment plan will be adjusted accordingly.  Other allergic rhinitis  Continue appropriate allergen avoidance measures and fluticasone nasal spray as needed.  I have also recommended nasal saline spray (i.e., Simply Saline) or nasal saline lavage (i.e., NeilMed) as needed prior to medicated nasal sprays.  GERD (gastroesophageal reflux disease)  Continue appropriate reflux lifestyle modifications and omeprazole as previously prescribed.   Return in about 4 months (around 03/27/2016), or if symptoms worsen or fail to improve.

## 2015-11-30 DIAGNOSIS — J0101 Acute recurrent maxillary sinusitis: Secondary | ICD-10-CM | POA: Diagnosis not present

## 2015-12-10 DIAGNOSIS — H401122 Primary open-angle glaucoma, left eye, moderate stage: Secondary | ICD-10-CM | POA: Diagnosis not present

## 2015-12-10 DIAGNOSIS — H401111 Primary open-angle glaucoma, right eye, mild stage: Secondary | ICD-10-CM | POA: Diagnosis not present

## 2015-12-11 DIAGNOSIS — J4550 Severe persistent asthma, uncomplicated: Secondary | ICD-10-CM | POA: Diagnosis not present

## 2015-12-12 ENCOUNTER — Ambulatory Visit (INDEPENDENT_AMBULATORY_CARE_PROVIDER_SITE_OTHER): Payer: BLUE CROSS/BLUE SHIELD | Admitting: *Deleted

## 2015-12-12 DIAGNOSIS — J309 Allergic rhinitis, unspecified: Secondary | ICD-10-CM | POA: Diagnosis not present

## 2015-12-18 ENCOUNTER — Ambulatory Visit (INDEPENDENT_AMBULATORY_CARE_PROVIDER_SITE_OTHER): Payer: BLUE CROSS/BLUE SHIELD

## 2015-12-18 DIAGNOSIS — J309 Allergic rhinitis, unspecified: Secondary | ICD-10-CM | POA: Diagnosis not present

## 2015-12-19 ENCOUNTER — Ambulatory Visit: Payer: BLUE CROSS/BLUE SHIELD

## 2015-12-19 ENCOUNTER — Ambulatory Visit (INDEPENDENT_AMBULATORY_CARE_PROVIDER_SITE_OTHER): Payer: BLUE CROSS/BLUE SHIELD

## 2015-12-19 DIAGNOSIS — J4550 Severe persistent asthma, uncomplicated: Secondary | ICD-10-CM | POA: Diagnosis not present

## 2015-12-24 ENCOUNTER — Ambulatory Visit (INDEPENDENT_AMBULATORY_CARE_PROVIDER_SITE_OTHER): Payer: BLUE CROSS/BLUE SHIELD | Admitting: *Deleted

## 2015-12-24 DIAGNOSIS — J309 Allergic rhinitis, unspecified: Secondary | ICD-10-CM

## 2015-12-27 DIAGNOSIS — J3089 Other allergic rhinitis: Secondary | ICD-10-CM | POA: Diagnosis not present

## 2015-12-31 ENCOUNTER — Ambulatory Visit (INDEPENDENT_AMBULATORY_CARE_PROVIDER_SITE_OTHER): Payer: BLUE CROSS/BLUE SHIELD

## 2015-12-31 DIAGNOSIS — J309 Allergic rhinitis, unspecified: Secondary | ICD-10-CM | POA: Diagnosis not present

## 2016-01-07 ENCOUNTER — Ambulatory Visit (INDEPENDENT_AMBULATORY_CARE_PROVIDER_SITE_OTHER): Payer: BLUE CROSS/BLUE SHIELD | Admitting: *Deleted

## 2016-01-07 DIAGNOSIS — J309 Allergic rhinitis, unspecified: Secondary | ICD-10-CM

## 2016-01-11 DIAGNOSIS — J4550 Severe persistent asthma, uncomplicated: Secondary | ICD-10-CM | POA: Diagnosis not present

## 2016-01-17 ENCOUNTER — Ambulatory Visit (INDEPENDENT_AMBULATORY_CARE_PROVIDER_SITE_OTHER): Payer: BLUE CROSS/BLUE SHIELD | Admitting: *Deleted

## 2016-01-17 DIAGNOSIS — J3089 Other allergic rhinitis: Secondary | ICD-10-CM

## 2016-01-21 ENCOUNTER — Ambulatory Visit (INDEPENDENT_AMBULATORY_CARE_PROVIDER_SITE_OTHER): Payer: BLUE CROSS/BLUE SHIELD | Admitting: *Deleted

## 2016-01-21 DIAGNOSIS — J3089 Other allergic rhinitis: Secondary | ICD-10-CM

## 2016-01-23 ENCOUNTER — Ambulatory Visit (INDEPENDENT_AMBULATORY_CARE_PROVIDER_SITE_OTHER): Payer: BLUE CROSS/BLUE SHIELD | Admitting: *Deleted

## 2016-01-23 DIAGNOSIS — J4550 Severe persistent asthma, uncomplicated: Secondary | ICD-10-CM

## 2016-01-25 DIAGNOSIS — F419 Anxiety disorder, unspecified: Secondary | ICD-10-CM | POA: Diagnosis not present

## 2016-01-25 DIAGNOSIS — I10 Essential (primary) hypertension: Secondary | ICD-10-CM | POA: Diagnosis not present

## 2016-01-25 DIAGNOSIS — J4521 Mild intermittent asthma with (acute) exacerbation: Secondary | ICD-10-CM | POA: Diagnosis not present

## 2016-01-25 DIAGNOSIS — K219 Gastro-esophageal reflux disease without esophagitis: Secondary | ICD-10-CM | POA: Diagnosis not present

## 2016-01-25 DIAGNOSIS — Z23 Encounter for immunization: Secondary | ICD-10-CM | POA: Diagnosis not present

## 2016-01-28 ENCOUNTER — Ambulatory Visit (INDEPENDENT_AMBULATORY_CARE_PROVIDER_SITE_OTHER): Payer: BLUE CROSS/BLUE SHIELD | Admitting: *Deleted

## 2016-01-28 DIAGNOSIS — J3089 Other allergic rhinitis: Secondary | ICD-10-CM | POA: Diagnosis not present

## 2016-02-05 ENCOUNTER — Ambulatory Visit (INDEPENDENT_AMBULATORY_CARE_PROVIDER_SITE_OTHER): Payer: BLUE CROSS/BLUE SHIELD

## 2016-02-05 DIAGNOSIS — J309 Allergic rhinitis, unspecified: Secondary | ICD-10-CM | POA: Diagnosis not present

## 2016-02-11 ENCOUNTER — Ambulatory Visit (INDEPENDENT_AMBULATORY_CARE_PROVIDER_SITE_OTHER): Payer: BLUE CROSS/BLUE SHIELD

## 2016-02-11 DIAGNOSIS — H40051 Ocular hypertension, right eye: Secondary | ICD-10-CM | POA: Diagnosis not present

## 2016-02-11 DIAGNOSIS — H40052 Ocular hypertension, left eye: Secondary | ICD-10-CM | POA: Diagnosis not present

## 2016-02-11 DIAGNOSIS — J309 Allergic rhinitis, unspecified: Secondary | ICD-10-CM | POA: Diagnosis not present

## 2016-02-15 DIAGNOSIS — J4550 Severe persistent asthma, uncomplicated: Secondary | ICD-10-CM | POA: Diagnosis not present

## 2016-02-18 ENCOUNTER — Ambulatory Visit (INDEPENDENT_AMBULATORY_CARE_PROVIDER_SITE_OTHER): Payer: BLUE CROSS/BLUE SHIELD | Admitting: *Deleted

## 2016-02-18 DIAGNOSIS — J309 Allergic rhinitis, unspecified: Secondary | ICD-10-CM

## 2016-02-20 ENCOUNTER — Ambulatory Visit (INDEPENDENT_AMBULATORY_CARE_PROVIDER_SITE_OTHER): Payer: BLUE CROSS/BLUE SHIELD | Admitting: *Deleted

## 2016-02-20 DIAGNOSIS — J4540 Moderate persistent asthma, uncomplicated: Secondary | ICD-10-CM

## 2016-02-25 ENCOUNTER — Ambulatory Visit (INDEPENDENT_AMBULATORY_CARE_PROVIDER_SITE_OTHER): Payer: BLUE CROSS/BLUE SHIELD | Admitting: *Deleted

## 2016-02-25 DIAGNOSIS — J309 Allergic rhinitis, unspecified: Secondary | ICD-10-CM

## 2016-02-28 DIAGNOSIS — M1612 Unilateral primary osteoarthritis, left hip: Secondary | ICD-10-CM | POA: Diagnosis not present

## 2016-03-01 DIAGNOSIS — J3089 Other allergic rhinitis: Secondary | ICD-10-CM | POA: Diagnosis not present

## 2016-03-03 ENCOUNTER — Ambulatory Visit (INDEPENDENT_AMBULATORY_CARE_PROVIDER_SITE_OTHER): Payer: BLUE CROSS/BLUE SHIELD | Admitting: *Deleted

## 2016-03-03 DIAGNOSIS — J309 Allergic rhinitis, unspecified: Secondary | ICD-10-CM

## 2016-03-10 DIAGNOSIS — L820 Inflamed seborrheic keratosis: Secondary | ICD-10-CM | POA: Diagnosis not present

## 2016-03-10 DIAGNOSIS — L309 Dermatitis, unspecified: Secondary | ICD-10-CM | POA: Diagnosis not present

## 2016-03-10 DIAGNOSIS — D485 Neoplasm of uncertain behavior of skin: Secondary | ICD-10-CM | POA: Diagnosis not present

## 2016-03-10 DIAGNOSIS — L859 Epidermal thickening, unspecified: Secondary | ICD-10-CM | POA: Diagnosis not present

## 2016-03-11 ENCOUNTER — Ambulatory Visit (INDEPENDENT_AMBULATORY_CARE_PROVIDER_SITE_OTHER): Payer: BLUE CROSS/BLUE SHIELD | Admitting: *Deleted

## 2016-03-11 DIAGNOSIS — J309 Allergic rhinitis, unspecified: Secondary | ICD-10-CM

## 2016-03-14 DIAGNOSIS — J4550 Severe persistent asthma, uncomplicated: Secondary | ICD-10-CM | POA: Diagnosis not present

## 2016-03-17 ENCOUNTER — Ambulatory Visit (INDEPENDENT_AMBULATORY_CARE_PROVIDER_SITE_OTHER): Payer: BLUE CROSS/BLUE SHIELD | Admitting: *Deleted

## 2016-03-17 DIAGNOSIS — J309 Allergic rhinitis, unspecified: Secondary | ICD-10-CM | POA: Diagnosis not present

## 2016-03-19 ENCOUNTER — Ambulatory Visit (INDEPENDENT_AMBULATORY_CARE_PROVIDER_SITE_OTHER): Payer: BLUE CROSS/BLUE SHIELD | Admitting: *Deleted

## 2016-03-19 DIAGNOSIS — J4540 Moderate persistent asthma, uncomplicated: Secondary | ICD-10-CM

## 2016-03-24 ENCOUNTER — Ambulatory Visit (INDEPENDENT_AMBULATORY_CARE_PROVIDER_SITE_OTHER): Payer: BLUE CROSS/BLUE SHIELD | Admitting: *Deleted

## 2016-03-24 DIAGNOSIS — J309 Allergic rhinitis, unspecified: Secondary | ICD-10-CM | POA: Diagnosis not present

## 2016-03-25 ENCOUNTER — Ambulatory Visit (INDEPENDENT_AMBULATORY_CARE_PROVIDER_SITE_OTHER): Payer: BLUE CROSS/BLUE SHIELD | Admitting: Allergy and Immunology

## 2016-03-25 ENCOUNTER — Encounter: Payer: Self-pay | Admitting: Allergy and Immunology

## 2016-03-25 VITALS — BP 142/96 | HR 92 | Resp 20 | Ht 62.5 in | Wt 228.4 lb

## 2016-03-25 DIAGNOSIS — I10 Essential (primary) hypertension: Secondary | ICD-10-CM | POA: Diagnosis not present

## 2016-03-25 DIAGNOSIS — K219 Gastro-esophageal reflux disease without esophagitis: Secondary | ICD-10-CM

## 2016-03-25 DIAGNOSIS — J4550 Severe persistent asthma, uncomplicated: Secondary | ICD-10-CM | POA: Diagnosis not present

## 2016-03-25 DIAGNOSIS — J3089 Other allergic rhinitis: Secondary | ICD-10-CM | POA: Diagnosis not present

## 2016-03-25 MED ORDER — TIOTROPIUM BROMIDE MONOHYDRATE 18 MCG IN CAPS: ORAL_CAPSULE | RESPIRATORY_TRACT | 5 refills | Status: DC

## 2016-03-25 MED ORDER — ALBUTEROL SULFATE (2.5 MG/3ML) 0.083% IN NEBU
2.5000 mg | INHALATION_SOLUTION | RESPIRATORY_TRACT | 1 refills | Status: DC | PRN
Start: 2016-03-25 — End: 2016-12-02

## 2016-03-25 NOTE — Patient Instructions (Signed)
Severe persistent asthma Well-controlled on current regimen.  Continue omalizumab injections monthly, Symbicort 160/4.5 g 2 inhalations via spacer device twice a day, Spiriva 18 g daily, montelukast 10 mg daily, and albuterol every 4-6 hours as needed.  If subjective and objective measures of pulmonary function remain stable, we will consider stepping down therapy on the next visit.  Allergic rhinitis  Continue appropriate allergen avoidance measures, montelukast 10 mg daily, and fluticasone nasal spray as needed.  GERD (gastroesophageal reflux disease)  Continue reflux lifestyle modifications and omeprazole as prescribed.  Essential hypertension Blood pressure elevated on 2 separate readings 15 minutes apart today.  Melanie Armstrong has been asked to follow up with her primary care physician regarding this issue.    Return in about 4 months (around 07/24/2016), or if symptoms worsen or fail to improve.

## 2016-03-25 NOTE — Assessment & Plan Note (Signed)
Blood pressure elevated on 2 separate readings 15 minutes apart today.  Melanie Armstrong has been asked to follow up with her primary care physician regarding this issue.

## 2016-03-25 NOTE — Assessment & Plan Note (Signed)
   Continue reflux lifestyle modifications and omeprazole as prescribed. 

## 2016-03-25 NOTE — Assessment & Plan Note (Signed)
   Continue appropriate allergen avoidance measures, montelukast 10 mg daily, and fluticasone nasal spray as needed. 

## 2016-03-25 NOTE — Assessment & Plan Note (Signed)
Well-controlled on current regimen.  Continue omalizumab injections monthly, Symbicort 160/4.5 g 2 inhalations via spacer device twice a day, Spiriva 18 g daily, montelukast 10 mg daily, and albuterol every 4-6 hours as needed.  If subjective and objective measures of pulmonary function remain stable, we will consider stepping down therapy on the next visit.

## 2016-03-25 NOTE — Progress Notes (Signed)
Follow-up Note  RE: Melanie Armstrong MRN: [id number] DOB: 06-30-1955 Date of Office Visit: 03/25/2016  Primary care provider: [name], MD Referring provider: [name], MD  History of present illness: Melanie Armstrong is a 60 y.o. female with persistent asthma, allergic rhinitis, and gastroesophageal reflux disease presenting today for follow up.  She was last seen in this clinic on 11/26/2015.  She reports that her asthma has been well-controlled in the interval since her previous visit.  She is receiving omalizumab injections monthly, using Symbicort 160/4.5, 2 inhalations via spacer device twice a day, Spiriva 18 g daily, and montelukast 10 mg daily.  She typically does not require albuterol rescue as what she is exposed to strong aromas such as perfumes, chemicals, and colognes.  She has no nasal symptom complaints or reflux related complaints today.  She states that she is compliant with lisinopril as prescribed.   Assessment and plan: Severe persistent asthma Well-controlled on current regimen.  Continue omalizumab injections monthly, Symbicort 160/4.5 g 2 inhalations via spacer device twice a day, Spiriva 18 g daily, montelukast 10 mg daily, and albuterol every 4-6 hours as needed.  If subjective and objective measures of pulmonary function remain stable, we will consider stepping down therapy on the next visit.  Allergic rhinitis  Continue appropriate allergen avoidance measures, montelukast 10 mg daily, and fluticasone nasal spray as needed.  GERD (gastroesophageal reflux disease)  Continue reflux lifestyle modifications and omeprazole as prescribed.  Essential hypertension Blood pressure elevated on 2 separate readings 15 minutes apart today.  Kingsley has been asked to follow up with her primary care physician regarding this issue.    Meds ordered this encounter  Medications  . tiotropium (SPIRIVA HANDIHALER) 18 MCG inhalation capsule    Sig: INHALE 1 PUFF  BY MOUTH EVERY MORNING TO PREVENT COUGH OR WHEEZE    Dispense:  30 capsule    Refill:  5  . albuterol (PROVENTIL) (2.5 MG/3ML) 0.083% nebulizer solution    Sig: Take 3 mLs (2.5 mg total) by nebulization every 4 (four) hours as needed for wheezing or shortness of breath.    Dispense:  225 mL    Refill:  1    Diagnostics: Spirometry:  Normal with an FEV1 of 109% predicted.  Please see scanned spirometry results for details.    Physical examination: Blood pressure (!) 142/96, pulse 92, resp. rate 20, height 5' 2.5" (1.588 m), weight 228 lb 6.4 oz (103.6 kg).  General: Alert, interactive, in no acute distress. HEENT: TMs pearly gray, turbinates mildly edematous without discharge, post-pharynx mildly erythematous. Neck: Supple without lymphadenopathy. Lungs: Clear to auscultation without wheezing, rhonchi or rales. CV: Normal S1, S2 without murmurs. Skin: Warm and dry, without lesions or rashes.  The following portions of the patient's history were reviewed and updated as appropriate: allergies, current medications, past family history, past medical history, past social history, past surgical history and problem list.    Medication List       Accurate as of 03/25/16  1:50 PM. Always use your most recent med list.          albuterol 108 (90 Base) MCG/ACT inhaler Commonly known as:  PROAIR HFA Inhale 2 puffs into the lungs every 4 (four) hours as needed for wheezing or shortness of breath.   albuterol (2.5 MG/3ML) 0.083% nebulizer solution Commonly known as:  PROVENTIL Take 3 mLs (2.5 mg total) by nebulization every 4 (four) hours as needed for wheezing or shortness of breath.  Azelastine HCl 0.15 % Soln Place 2 sprays into the nose daily.   budesonide-formoterol 160-4.5 MCG/ACT inhaler Commonly known as:  SYMBICORT Inhale 2 puffs into the lungs 2 (two) times daily.   calcium-vitamin D 500-200 MG-UNIT tablet Commonly known as:  OSCAL WITH D Take 1 tablet by mouth daily  with breakfast.   diazepam 5 MG tablet Commonly known as:  VALIUM Take 5 mg by mouth every 6 (six) hours as needed for anxiety.   EPINEPHrine 0.3 mg/0.3 mL Soaj injection Commonly known as:  EPIPEN 2-PAK USE AS DIRECTED FOR SEVERE ALLERGIC REACTION   ferrous sulfate 325 (65 FE) MG tablet Take 1 tablet (325 mg total) by mouth 3 (three) times daily after meals.   fexofenadine 180 MG tablet Commonly known as:  ALLEGRA Take 180 mg by mouth daily.   fluticasone 50 MCG/ACT nasal spray Commonly known as:  FLONASE Place 1-2 sprays into both nostrils daily as needed for allergies or rhinitis.   furosemide 20 MG tablet Commonly known as:  LASIX Take 20 mg by mouth every morning.   glucosamine-chondroitin 500-400 MG tablet Take 1 tablet by mouth 2 (two) times daily.   lisinopril 40 MG tablet Commonly known as:  PRINIVIL,ZESTRIL Take 40 mg by mouth at bedtime.   montelukast 10 MG tablet Commonly known as:  SINGULAIR Take 1 tablet (10 mg total) by mouth at bedtime.   multivitamin with minerals Tabs tablet Take 1 tablet by mouth daily.   OMEGA 3 PO Take 1 capsule by mouth daily.   omeprazole 20 MG capsule Commonly known as:  PRILOSEC Take 1 capsule (20 mg total) by mouth daily.   PRESCRIPTION MEDICATION once a week.   tiotropium 18 MCG inhalation capsule Commonly known as:  SPIRIVA HANDIHALER INHALE 1 PUFF BY MOUTH EVERY MORNING TO PREVENT COUGH OR WHEEZE   vitamin C 500 MG tablet Commonly known as:  ASCORBIC ACID Take 500 mg by mouth daily.   XOLAIR 150 MG injection Generic drug:  omalizumab INJECT 300 MG SUBCUTANEOUSLY EVERY 4 WEEKS -REFRIGERATE DO NOT FREEZE       Allergies  Allergen Reactions  . Avelox [Moxifloxacin Hcl In Nacl] Other (See Comments)    Muscle Aches  . Levofloxacin Other (See Comments)    Tendon pain    I appreciate the opportunity to take part in [facility]'s care. Please do not hesitate to contact me with questions.  Sincerely,   R.  Edgar Frisk, MD

## 2016-03-31 ENCOUNTER — Ambulatory Visit: Payer: BLUE CROSS/BLUE SHIELD | Admitting: Allergy and Immunology

## 2016-03-31 ENCOUNTER — Ambulatory Visit (INDEPENDENT_AMBULATORY_CARE_PROVIDER_SITE_OTHER): Payer: BLUE CROSS/BLUE SHIELD | Admitting: *Deleted

## 2016-03-31 DIAGNOSIS — J3089 Other allergic rhinitis: Secondary | ICD-10-CM | POA: Diagnosis not present

## 2016-04-09 ENCOUNTER — Ambulatory Visit (INDEPENDENT_AMBULATORY_CARE_PROVIDER_SITE_OTHER): Payer: BLUE CROSS/BLUE SHIELD | Admitting: *Deleted

## 2016-04-09 DIAGNOSIS — J3089 Other allergic rhinitis: Secondary | ICD-10-CM | POA: Diagnosis not present

## 2016-04-11 DIAGNOSIS — J4550 Severe persistent asthma, uncomplicated: Secondary | ICD-10-CM | POA: Diagnosis not present

## 2016-04-15 ENCOUNTER — Ambulatory Visit (INDEPENDENT_AMBULATORY_CARE_PROVIDER_SITE_OTHER): Payer: BLUE CROSS/BLUE SHIELD | Admitting: *Deleted

## 2016-04-15 DIAGNOSIS — J3089 Other allergic rhinitis: Secondary | ICD-10-CM

## 2016-04-16 ENCOUNTER — Ambulatory Visit: Payer: BLUE CROSS/BLUE SHIELD

## 2016-04-18 ENCOUNTER — Ambulatory Visit (INDEPENDENT_AMBULATORY_CARE_PROVIDER_SITE_OTHER): Payer: BLUE CROSS/BLUE SHIELD

## 2016-04-18 DIAGNOSIS — J4550 Severe persistent asthma, uncomplicated: Secondary | ICD-10-CM

## 2016-04-21 ENCOUNTER — Ambulatory Visit (INDEPENDENT_AMBULATORY_CARE_PROVIDER_SITE_OTHER): Payer: BLUE CROSS/BLUE SHIELD | Admitting: *Deleted

## 2016-04-21 DIAGNOSIS — J309 Allergic rhinitis, unspecified: Secondary | ICD-10-CM | POA: Diagnosis not present

## 2016-04-28 ENCOUNTER — Ambulatory Visit (INDEPENDENT_AMBULATORY_CARE_PROVIDER_SITE_OTHER): Payer: BLUE CROSS/BLUE SHIELD | Admitting: *Deleted

## 2016-04-28 DIAGNOSIS — J309 Allergic rhinitis, unspecified: Secondary | ICD-10-CM | POA: Diagnosis not present

## 2016-05-02 NOTE — Addendum Note (Signed)
Addended by: Felipa Emory on: 05/02/2016 03:21 PM   Modules accepted: Orders

## 2016-05-05 ENCOUNTER — Ambulatory Visit (INDEPENDENT_AMBULATORY_CARE_PROVIDER_SITE_OTHER): Payer: BLUE CROSS/BLUE SHIELD

## 2016-05-05 DIAGNOSIS — J309 Allergic rhinitis, unspecified: Secondary | ICD-10-CM | POA: Diagnosis not present

## 2016-05-09 DIAGNOSIS — J3089 Other allergic rhinitis: Secondary | ICD-10-CM | POA: Diagnosis not present

## 2016-05-12 DIAGNOSIS — H40053 Ocular hypertension, bilateral: Secondary | ICD-10-CM | POA: Diagnosis not present

## 2016-05-13 ENCOUNTER — Other Ambulatory Visit: Payer: Self-pay

## 2016-05-13 ENCOUNTER — Telehealth: Payer: Self-pay

## 2016-05-13 ENCOUNTER — Ambulatory Visit (INDEPENDENT_AMBULATORY_CARE_PROVIDER_SITE_OTHER): Payer: BLUE CROSS/BLUE SHIELD

## 2016-05-13 DIAGNOSIS — J309 Allergic rhinitis, unspecified: Secondary | ICD-10-CM | POA: Diagnosis not present

## 2016-05-13 MED ORDER — TIOTROPIUM BROMIDE MONOHYDRATE 2.5 MCG/ACT IN AERS: 2.0000 | INHALATION_SPRAY | RESPIRATORY_TRACT | 5 refills | Status: DC

## 2016-05-13 NOTE — Telephone Encounter (Signed)
Pt came in for her injection today and was wondering if you could change her to spiriva respimat so her insurance will cover it?  Please advise

## 2016-05-13 NOTE — Telephone Encounter (Signed)
Yes, may switch to spiriva respimat if her insurance will cover it.

## 2016-05-13 NOTE — Telephone Encounter (Signed)
Sent in spirivia respmate

## 2016-05-14 DIAGNOSIS — J4550 Severe persistent asthma, uncomplicated: Secondary | ICD-10-CM | POA: Diagnosis not present

## 2016-05-19 ENCOUNTER — Ambulatory Visit (INDEPENDENT_AMBULATORY_CARE_PROVIDER_SITE_OTHER): Payer: BLUE CROSS/BLUE SHIELD

## 2016-05-19 DIAGNOSIS — J309 Allergic rhinitis, unspecified: Secondary | ICD-10-CM | POA: Diagnosis not present

## 2016-05-21 ENCOUNTER — Ambulatory Visit (INDEPENDENT_AMBULATORY_CARE_PROVIDER_SITE_OTHER): Payer: BLUE CROSS/BLUE SHIELD | Admitting: *Deleted

## 2016-05-21 DIAGNOSIS — J4550 Severe persistent asthma, uncomplicated: Secondary | ICD-10-CM | POA: Diagnosis not present

## 2016-05-26 ENCOUNTER — Ambulatory Visit (INDEPENDENT_AMBULATORY_CARE_PROVIDER_SITE_OTHER): Payer: BLUE CROSS/BLUE SHIELD | Admitting: *Deleted

## 2016-05-26 DIAGNOSIS — J309 Allergic rhinitis, unspecified: Secondary | ICD-10-CM

## 2016-05-29 DIAGNOSIS — J0101 Acute recurrent maxillary sinusitis: Secondary | ICD-10-CM | POA: Diagnosis not present

## 2016-05-30 ENCOUNTER — Other Ambulatory Visit: Payer: Self-pay | Admitting: Allergy and Immunology

## 2016-06-02 ENCOUNTER — Ambulatory Visit (INDEPENDENT_AMBULATORY_CARE_PROVIDER_SITE_OTHER): Payer: BLUE CROSS/BLUE SHIELD

## 2016-06-02 DIAGNOSIS — J309 Allergic rhinitis, unspecified: Secondary | ICD-10-CM

## 2016-06-09 ENCOUNTER — Ambulatory Visit (INDEPENDENT_AMBULATORY_CARE_PROVIDER_SITE_OTHER): Payer: BLUE CROSS/BLUE SHIELD | Admitting: *Deleted

## 2016-06-09 DIAGNOSIS — J309 Allergic rhinitis, unspecified: Secondary | ICD-10-CM

## 2016-06-11 DIAGNOSIS — J4550 Severe persistent asthma, uncomplicated: Secondary | ICD-10-CM | POA: Diagnosis not present

## 2016-06-16 ENCOUNTER — Ambulatory Visit (INDEPENDENT_AMBULATORY_CARE_PROVIDER_SITE_OTHER): Payer: BLUE CROSS/BLUE SHIELD | Admitting: *Deleted

## 2016-06-16 DIAGNOSIS — J309 Allergic rhinitis, unspecified: Secondary | ICD-10-CM

## 2016-06-18 ENCOUNTER — Ambulatory Visit (INDEPENDENT_AMBULATORY_CARE_PROVIDER_SITE_OTHER): Payer: BLUE CROSS/BLUE SHIELD | Admitting: *Deleted

## 2016-06-18 DIAGNOSIS — J4540 Moderate persistent asthma, uncomplicated: Secondary | ICD-10-CM | POA: Diagnosis not present

## 2016-06-24 ENCOUNTER — Telehealth: Payer: Self-pay | Admitting: *Deleted

## 2016-06-24 NOTE — Telephone Encounter (Signed)
Left message - told her statements have been going out & her bal - told her to call back with any other questions

## 2016-06-24 NOTE — Telephone Encounter (Signed)
Pt would like a return call regarding her bill. Pt states she just received her first statement and it says its past due. Pt wants to know why.

## 2016-06-25 ENCOUNTER — Ambulatory Visit (INDEPENDENT_AMBULATORY_CARE_PROVIDER_SITE_OTHER): Payer: BLUE CROSS/BLUE SHIELD

## 2016-06-25 DIAGNOSIS — J309 Allergic rhinitis, unspecified: Secondary | ICD-10-CM | POA: Diagnosis not present

## 2016-06-30 ENCOUNTER — Ambulatory Visit (INDEPENDENT_AMBULATORY_CARE_PROVIDER_SITE_OTHER): Payer: BLUE CROSS/BLUE SHIELD

## 2016-06-30 DIAGNOSIS — J309 Allergic rhinitis, unspecified: Secondary | ICD-10-CM

## 2016-07-07 ENCOUNTER — Ambulatory Visit (INDEPENDENT_AMBULATORY_CARE_PROVIDER_SITE_OTHER): Payer: BLUE CROSS/BLUE SHIELD

## 2016-07-07 DIAGNOSIS — J309 Allergic rhinitis, unspecified: Secondary | ICD-10-CM

## 2016-07-14 ENCOUNTER — Ambulatory Visit (INDEPENDENT_AMBULATORY_CARE_PROVIDER_SITE_OTHER): Payer: BLUE CROSS/BLUE SHIELD | Admitting: *Deleted

## 2016-07-14 DIAGNOSIS — J309 Allergic rhinitis, unspecified: Secondary | ICD-10-CM

## 2016-07-14 DIAGNOSIS — J4550 Severe persistent asthma, uncomplicated: Secondary | ICD-10-CM | POA: Diagnosis not present

## 2016-07-16 ENCOUNTER — Ambulatory Visit (INDEPENDENT_AMBULATORY_CARE_PROVIDER_SITE_OTHER): Payer: BLUE CROSS/BLUE SHIELD | Admitting: *Deleted

## 2016-07-16 DIAGNOSIS — J4540 Moderate persistent asthma, uncomplicated: Secondary | ICD-10-CM | POA: Diagnosis not present

## 2016-07-22 ENCOUNTER — Ambulatory Visit (INDEPENDENT_AMBULATORY_CARE_PROVIDER_SITE_OTHER): Payer: BLUE CROSS/BLUE SHIELD | Admitting: *Deleted

## 2016-07-22 DIAGNOSIS — J309 Allergic rhinitis, unspecified: Secondary | ICD-10-CM | POA: Diagnosis not present

## 2016-07-28 ENCOUNTER — Encounter: Payer: Self-pay | Admitting: Allergy and Immunology

## 2016-07-28 ENCOUNTER — Ambulatory Visit (INDEPENDENT_AMBULATORY_CARE_PROVIDER_SITE_OTHER): Payer: BLUE CROSS/BLUE SHIELD | Admitting: Allergy and Immunology

## 2016-07-28 DIAGNOSIS — K219 Gastro-esophageal reflux disease without esophagitis: Secondary | ICD-10-CM

## 2016-07-28 DIAGNOSIS — J3089 Other allergic rhinitis: Secondary | ICD-10-CM

## 2016-07-28 DIAGNOSIS — J4550 Severe persistent asthma, uncomplicated: Secondary | ICD-10-CM | POA: Diagnosis not present

## 2016-07-28 MED ORDER — TIOTROPIUM BROMIDE MONOHYDRATE 18 MCG IN CAPS: ORAL_CAPSULE | RESPIRATORY_TRACT | 5 refills | Status: DC

## 2016-07-28 MED ORDER — EPINEPHRINE 0.3 MG/0.3ML IJ SOAJ: INTRAMUSCULAR | 1 refills | Status: DC

## 2016-07-28 NOTE — Progress Notes (Signed)
Follow-up Note  RE: JAHLISA ROSSITTO MRN: [id number] DOB: 07/12/1955 Date of Office Visit: 07/28/2016  Primary care provider: [name], MD Referring provider: [name], MD  History of present illness: Melanie Armstrong is a 61 y.o. female with persistent asthma, allergic rhinitis, and gastroesophageal reflux presenting today for follow up.  She was last seen in this clinic in December 2017.  Despite compliance with omalizumab injections monthly, Symbicort 160-4.5 g, 2 inhalations via spacer device twice a day, Spiriva 18 g daily, and montelukast 10 mg daily, she still requires albuterol rescue almost daily while at work if she is exposed to someone's perfume or cologne.  She does her best to avoid exposure to strong aromas, however this is not possible at her place of work.  She rarely experiences nocturnal awakenings due to lower respiratory symptoms, typically fewer than 2 times per month.  Her nasal symptoms are well controlled with nasal saline irrigation and fluticasone nasal spray as needed.   Assessment and plan: Severe persistent asthma  We will check CBC with differential to assess candidacy for benralizumab or mepolizumab.   When lab results have returned the patient will be called with further recommendations.  For now, continue omalizumab injections monthly, Symbicort 160/4.5 g 2 inhalations via spacer device twice a day, Spiriva 18 g daily, montelukast 10 mg daily, and albuterol every 4-6 hours as needed.  Allergic rhinitis Stable.  Continue appropriate allergen avoidance measures, montelukast 10 mg daily, and fluticasone nasal spray as needed.  GERD (gastroesophageal reflux disease)  Continue reflux lifestyle modifications and omeprazole as prescribed.   Meds ordered this encounter  Medications  . tiotropium (SPIRIVA HANDIHALER) 18 MCG inhalation capsule    Sig: INHALE 1 PUFF BY MOUTH EVERY MORNING TO PREVENT COUGH OR WHEEZE    Dispense:  30 capsule      Refill:  5    Please hold until patient needs.  Marland Kitchen EPINEPHrine (EPIPEN 2-PAK) 0.3 mg/0.3 mL IJ SOAJ injection    Sig: USE AS DIRECTED FOR SEVERE ALLERGIC REACTION    Dispense:  2 Device    Refill:  1    Diagnostics: Spirometry:  Normal with an FEV1 of 111% predicted.  Please see scanned spirometry results for details.    Physical examination: Blood pressure 140/78, pulse 89, temperature 97.7 F (36.5 C), temperature source Oral, resp. rate 16, height 5\' 2"  (1.575 m), weight 227 lb 6.4 oz (103.1 kg), SpO2 96 %.  General: Alert, interactive, in no acute distress. HEENT: TMs pearly gray, turbinates mildly edematous without discharge, post-pharynx unremarkable. Neck: Supple without lymphadenopathy. Lungs: Clear to auscultation without wheezing, rhonchi or rales. CV: Normal S1, S2 without murmurs. Skin: Warm and dry, without lesions or rashes.  The following portions of the patient's history were reviewed and updated as appropriate: allergies, current medications, past family history, past medical history, past social history, past surgical history and problem list.  Allergies as of 07/28/2016      Reactions   Avelox [moxifloxacin Hcl In Nacl] Other (See Comments)   Muscle Aches   Levofloxacin Other (See Comments)   Tendon pain      Medication List       Accurate as of 07/28/16  1:10 PM. Always use your most recent med list.          albuterol 108 (90 Base) MCG/ACT inhaler Commonly known as:  PROAIR HFA Inhale 2 puffs into the lungs every 4 (four) hours as needed for wheezing or shortness of breath.  albuterol (2.5 MG/3ML) 0.083% nebulizer solution Commonly known as:  PROVENTIL Take 3 mLs (2.5 mg total) by nebulization every 4 (four) hours as needed for wheezing or shortness of breath.   Azelastine HCl 0.15 % Soln Place 2 sprays into the nose daily.   budesonide-formoterol 160-4.5 MCG/ACT inhaler Commonly known as:  SYMBICORT Inhale 2 puffs into the lungs 2 (two)  times daily.   calcium-vitamin D 500-200 MG-UNIT tablet Commonly known as:  OSCAL WITH D Take 1 tablet by mouth daily with breakfast.   diazepam 5 MG tablet Commonly known as:  VALIUM Take 5 mg by mouth every 6 (six) hours as needed for anxiety.   EPINEPHrine 0.3 mg/0.3 mL Soaj injection Commonly known as:  EPIPEN 2-PAK USE AS DIRECTED FOR SEVERE ALLERGIC REACTION   ferrous sulfate 325 (65 FE) MG tablet Take 1 tablet (325 mg total) by mouth 3 (three) times daily after meals.   fexofenadine 180 MG tablet Commonly known as:  ALLEGRA Take 180 mg by mouth daily.   fluticasone 50 MCG/ACT nasal spray Commonly known as:  FLONASE Place 1-2 sprays into both nostrils daily as needed for allergies or rhinitis.   furosemide 20 MG tablet Commonly known as:  LASIX Take 20 mg by mouth every morning.   glucosamine-chondroitin 500-400 MG tablet Take 1 tablet by mouth 2 (two) times daily.   lisinopril 40 MG tablet Commonly known as:  PRINIVIL,ZESTRIL Take 40 mg by mouth at bedtime.   montelukast 10 MG tablet Commonly known as:  SINGULAIR Take 1 tablet (10 mg total) by mouth at bedtime.   multivitamin with minerals Tabs tablet Take 1 tablet by mouth daily.   OMEGA 3 PO Take 1 capsule by mouth daily.   omeprazole 20 MG capsule Commonly known as:  PRILOSEC Take 1 capsule (20 mg total) by mouth daily.   PRESCRIPTION MEDICATION once a week.   tiotropium 18 MCG inhalation capsule Commonly known as:  SPIRIVA HANDIHALER INHALE 1 PUFF BY MOUTH EVERY MORNING TO PREVENT COUGH OR WHEEZE   vitamin C 500 MG tablet Commonly known as:  ASCORBIC ACID Take 500 mg by mouth daily.   XOLAIR 150 MG injection Generic drug:  omalizumab INJECT 300 MG SUBCUTANEOUSLY EVERY 4 WEEKS -REFRIGERATE DO NOT FREEZE       Allergies  Allergen Reactions  . Avelox [Moxifloxacin Hcl In Nacl] Other (See Comments)    Muscle Aches  . Levofloxacin Other (See Comments)    Tendon pain    I appreciate the  opportunity to take part in [place]'s care. Please do not hesitate to contact me with questions.  Sincerely,   R. Edgar Frisk, MD

## 2016-07-28 NOTE — Assessment & Plan Note (Signed)
Stable.  Continue appropriate allergen avoidance measures, montelukast 10 mg daily, and fluticasone nasal spray as needed.

## 2016-07-28 NOTE — Assessment & Plan Note (Addendum)
   We will check CBC with differential to assess candidacy for benralizumab or mepolizumab.   When lab results have returned the patient will be called with further recommendations.  For now, continue omalizumab injections monthly, Symbicort 160/4.5 g 2 inhalations via spacer device twice a day, Spiriva 18 g daily, montelukast 10 mg daily, and albuterol every 4-6 hours as needed.

## 2016-07-28 NOTE — Patient Instructions (Signed)
Severe persistent asthma  We will check CBC with differential to assess her candidacy for benralizumab or mepolizumab.   When lab results have returned the patient will be called with further recommendations.  For now, continue omalizumab injections monthly, Symbicort 160/4.5 g 2 inhalations via spacer device twice a day, Spiriva 18 g daily, montelukast 10 mg daily, and albuterol every 4-6 hours as needed.  Allergic rhinitis Stable.  Continue appropriate allergen avoidance measures, montelukast 10 mg daily, and fluticasone nasal spray as needed.  GERD (gastroesophageal reflux disease)  Continue reflux lifestyle modifications and omeprazole as prescribed.   Return in about 4 months (around 11/27/2016), or sooner if needed. When lab results have returned the patient will be called with further recommendations.

## 2016-07-28 NOTE — Assessment & Plan Note (Signed)
   Continue reflux lifestyle modifications and omeprazole as prescribed. 

## 2016-07-29 DIAGNOSIS — J4550 Severe persistent asthma, uncomplicated: Secondary | ICD-10-CM | POA: Diagnosis not present

## 2016-07-29 LAB — CBC WITH DIFFERENTIAL/PLATELET
Basophils Absolute: 0 10*3/uL (ref 0.0–0.2)
Basos: 0 %
EOS (ABSOLUTE): 0.1 10*3/uL (ref 0.0–0.4)
Eos: 1 %
Hematocrit: 41.9 % (ref 34.0–46.6)
Hemoglobin: 14.7 g/dL (ref 11.1–15.9)
Lymphocytes Absolute: 2.1 10*3/uL (ref 0.7–3.1)
Lymphs: 32 %
MCH: 31.9 pg (ref 26.6–33.0)
MCHC: 35.1 g/dL (ref 31.5–35.7)
MCV: 91 fL (ref 79–97)
Monocytes Absolute: 0.6 10*3/uL (ref 0.1–0.9)
Monocytes: 9 %
Neutrophils Absolute: 3.7 10*3/uL (ref 1.4–7.0)
Neutrophils: 58 %
Platelets: 271 10*3/uL (ref 150–379)
RBC: 4.61 x10E6/uL (ref 3.77–5.28)
RDW: 12.7 % (ref 12.3–15.4)
WBC: 6.5 10*3/uL (ref 3.4–10.8)

## 2016-08-01 DIAGNOSIS — I10 Essential (primary) hypertension: Secondary | ICD-10-CM | POA: Diagnosis not present

## 2016-08-01 DIAGNOSIS — J4521 Mild intermittent asthma with (acute) exacerbation: Secondary | ICD-10-CM | POA: Diagnosis not present

## 2016-08-01 DIAGNOSIS — K219 Gastro-esophageal reflux disease without esophagitis: Secondary | ICD-10-CM | POA: Diagnosis not present

## 2016-08-01 DIAGNOSIS — F419 Anxiety disorder, unspecified: Secondary | ICD-10-CM | POA: Diagnosis not present

## 2016-08-04 ENCOUNTER — Ambulatory Visit (INDEPENDENT_AMBULATORY_CARE_PROVIDER_SITE_OTHER): Payer: BLUE CROSS/BLUE SHIELD | Admitting: *Deleted

## 2016-08-04 DIAGNOSIS — J3089 Other allergic rhinitis: Secondary | ICD-10-CM

## 2016-08-06 ENCOUNTER — Encounter: Payer: Self-pay | Admitting: *Deleted

## 2016-08-06 NOTE — Progress Notes (Signed)
Maintenance vial made

## 2016-08-08 DIAGNOSIS — J3089 Other allergic rhinitis: Secondary | ICD-10-CM | POA: Diagnosis not present

## 2016-08-11 ENCOUNTER — Ambulatory Visit (INDEPENDENT_AMBULATORY_CARE_PROVIDER_SITE_OTHER): Payer: BLUE CROSS/BLUE SHIELD | Admitting: *Deleted

## 2016-08-11 DIAGNOSIS — J3089 Other allergic rhinitis: Secondary | ICD-10-CM

## 2016-08-12 DIAGNOSIS — J4550 Severe persistent asthma, uncomplicated: Secondary | ICD-10-CM | POA: Diagnosis not present

## 2016-08-13 ENCOUNTER — Ambulatory Visit (INDEPENDENT_AMBULATORY_CARE_PROVIDER_SITE_OTHER): Payer: BLUE CROSS/BLUE SHIELD | Admitting: *Deleted

## 2016-08-13 DIAGNOSIS — J4550 Severe persistent asthma, uncomplicated: Secondary | ICD-10-CM

## 2016-08-18 ENCOUNTER — Ambulatory Visit (INDEPENDENT_AMBULATORY_CARE_PROVIDER_SITE_OTHER): Payer: BLUE CROSS/BLUE SHIELD

## 2016-08-18 DIAGNOSIS — J309 Allergic rhinitis, unspecified: Secondary | ICD-10-CM | POA: Diagnosis not present

## 2016-08-21 DIAGNOSIS — M1612 Unilateral primary osteoarthritis, left hip: Secondary | ICD-10-CM | POA: Diagnosis not present

## 2016-08-25 ENCOUNTER — Ambulatory Visit (INDEPENDENT_AMBULATORY_CARE_PROVIDER_SITE_OTHER): Payer: BLUE CROSS/BLUE SHIELD

## 2016-08-25 DIAGNOSIS — J309 Allergic rhinitis, unspecified: Secondary | ICD-10-CM

## 2016-09-04 ENCOUNTER — Ambulatory Visit (INDEPENDENT_AMBULATORY_CARE_PROVIDER_SITE_OTHER): Payer: BLUE CROSS/BLUE SHIELD | Admitting: *Deleted

## 2016-09-04 DIAGNOSIS — J309 Allergic rhinitis, unspecified: Secondary | ICD-10-CM

## 2016-09-09 ENCOUNTER — Ambulatory Visit (INDEPENDENT_AMBULATORY_CARE_PROVIDER_SITE_OTHER): Payer: BLUE CROSS/BLUE SHIELD

## 2016-09-09 DIAGNOSIS — J309 Allergic rhinitis, unspecified: Secondary | ICD-10-CM

## 2016-09-10 ENCOUNTER — Ambulatory Visit: Payer: BLUE CROSS/BLUE SHIELD

## 2016-09-10 DIAGNOSIS — J4550 Severe persistent asthma, uncomplicated: Secondary | ICD-10-CM | POA: Diagnosis not present

## 2016-09-10 DIAGNOSIS — J0101 Acute recurrent maxillary sinusitis: Secondary | ICD-10-CM | POA: Diagnosis not present

## 2016-09-10 DIAGNOSIS — J4521 Mild intermittent asthma with (acute) exacerbation: Secondary | ICD-10-CM | POA: Diagnosis not present

## 2016-09-11 ENCOUNTER — Ambulatory Visit: Payer: Self-pay

## 2016-09-11 DIAGNOSIS — H40053 Ocular hypertension, bilateral: Secondary | ICD-10-CM | POA: Diagnosis not present

## 2016-09-15 ENCOUNTER — Ambulatory Visit (INDEPENDENT_AMBULATORY_CARE_PROVIDER_SITE_OTHER): Payer: BLUE CROSS/BLUE SHIELD

## 2016-09-15 DIAGNOSIS — J309 Allergic rhinitis, unspecified: Secondary | ICD-10-CM

## 2016-09-18 ENCOUNTER — Ambulatory Visit (INDEPENDENT_AMBULATORY_CARE_PROVIDER_SITE_OTHER): Payer: BLUE CROSS/BLUE SHIELD | Admitting: *Deleted

## 2016-09-18 DIAGNOSIS — J4550 Severe persistent asthma, uncomplicated: Secondary | ICD-10-CM

## 2016-09-22 ENCOUNTER — Ambulatory Visit (INDEPENDENT_AMBULATORY_CARE_PROVIDER_SITE_OTHER): Payer: BLUE CROSS/BLUE SHIELD

## 2016-09-22 DIAGNOSIS — J309 Allergic rhinitis, unspecified: Secondary | ICD-10-CM | POA: Diagnosis not present

## 2016-09-29 ENCOUNTER — Ambulatory Visit (INDEPENDENT_AMBULATORY_CARE_PROVIDER_SITE_OTHER): Payer: BLUE CROSS/BLUE SHIELD

## 2016-09-29 DIAGNOSIS — J309 Allergic rhinitis, unspecified: Secondary | ICD-10-CM

## 2016-10-06 ENCOUNTER — Ambulatory Visit (INDEPENDENT_AMBULATORY_CARE_PROVIDER_SITE_OTHER): Payer: BLUE CROSS/BLUE SHIELD | Admitting: *Deleted

## 2016-10-06 DIAGNOSIS — J309 Allergic rhinitis, unspecified: Secondary | ICD-10-CM

## 2016-10-10 DIAGNOSIS — J301 Allergic rhinitis due to pollen: Secondary | ICD-10-CM | POA: Diagnosis not present

## 2016-10-13 ENCOUNTER — Ambulatory Visit (INDEPENDENT_AMBULATORY_CARE_PROVIDER_SITE_OTHER): Payer: BLUE CROSS/BLUE SHIELD

## 2016-10-13 DIAGNOSIS — J309 Allergic rhinitis, unspecified: Secondary | ICD-10-CM | POA: Diagnosis not present

## 2016-10-13 DIAGNOSIS — J4550 Severe persistent asthma, uncomplicated: Secondary | ICD-10-CM | POA: Diagnosis not present

## 2016-10-16 ENCOUNTER — Ambulatory Visit (INDEPENDENT_AMBULATORY_CARE_PROVIDER_SITE_OTHER): Payer: BLUE CROSS/BLUE SHIELD | Admitting: *Deleted

## 2016-10-16 DIAGNOSIS — J4550 Severe persistent asthma, uncomplicated: Secondary | ICD-10-CM | POA: Diagnosis not present

## 2016-10-20 ENCOUNTER — Ambulatory Visit (INDEPENDENT_AMBULATORY_CARE_PROVIDER_SITE_OTHER): Payer: BLUE CROSS/BLUE SHIELD

## 2016-10-20 DIAGNOSIS — J309 Allergic rhinitis, unspecified: Secondary | ICD-10-CM

## 2016-10-27 ENCOUNTER — Ambulatory Visit (INDEPENDENT_AMBULATORY_CARE_PROVIDER_SITE_OTHER): Payer: BLUE CROSS/BLUE SHIELD | Admitting: *Deleted

## 2016-10-27 DIAGNOSIS — J309 Allergic rhinitis, unspecified: Secondary | ICD-10-CM | POA: Diagnosis not present

## 2016-11-03 DIAGNOSIS — Z6838 Body mass index (BMI) 38.0-38.9, adult: Secondary | ICD-10-CM | POA: Diagnosis not present

## 2016-11-03 DIAGNOSIS — Z01419 Encounter for gynecological examination (general) (routine) without abnormal findings: Secondary | ICD-10-CM | POA: Diagnosis not present

## 2016-11-04 ENCOUNTER — Ambulatory Visit (INDEPENDENT_AMBULATORY_CARE_PROVIDER_SITE_OTHER): Payer: BLUE CROSS/BLUE SHIELD | Admitting: *Deleted

## 2016-11-04 DIAGNOSIS — J309 Allergic rhinitis, unspecified: Secondary | ICD-10-CM

## 2016-11-06 DIAGNOSIS — Z1231 Encounter for screening mammogram for malignant neoplasm of breast: Secondary | ICD-10-CM | POA: Diagnosis not present

## 2016-11-06 DIAGNOSIS — Z6838 Body mass index (BMI) 38.0-38.9, adult: Secondary | ICD-10-CM | POA: Diagnosis not present

## 2016-11-10 ENCOUNTER — Ambulatory Visit (INDEPENDENT_AMBULATORY_CARE_PROVIDER_SITE_OTHER): Payer: BLUE CROSS/BLUE SHIELD

## 2016-11-10 DIAGNOSIS — J309 Allergic rhinitis, unspecified: Secondary | ICD-10-CM

## 2016-11-11 DIAGNOSIS — J4550 Severe persistent asthma, uncomplicated: Secondary | ICD-10-CM | POA: Diagnosis not present

## 2016-11-12 ENCOUNTER — Ambulatory Visit (INDEPENDENT_AMBULATORY_CARE_PROVIDER_SITE_OTHER): Payer: BLUE CROSS/BLUE SHIELD

## 2016-11-12 DIAGNOSIS — J4550 Severe persistent asthma, uncomplicated: Secondary | ICD-10-CM

## 2016-11-17 ENCOUNTER — Ambulatory Visit (INDEPENDENT_AMBULATORY_CARE_PROVIDER_SITE_OTHER): Payer: BLUE CROSS/BLUE SHIELD

## 2016-11-17 DIAGNOSIS — J309 Allergic rhinitis, unspecified: Secondary | ICD-10-CM

## 2016-11-24 ENCOUNTER — Ambulatory Visit (INDEPENDENT_AMBULATORY_CARE_PROVIDER_SITE_OTHER): Payer: BLUE CROSS/BLUE SHIELD

## 2016-11-24 DIAGNOSIS — J309 Allergic rhinitis, unspecified: Secondary | ICD-10-CM

## 2016-12-01 ENCOUNTER — Encounter: Payer: Self-pay | Admitting: Allergy and Immunology

## 2016-12-01 ENCOUNTER — Ambulatory Visit (INDEPENDENT_AMBULATORY_CARE_PROVIDER_SITE_OTHER): Payer: BLUE CROSS/BLUE SHIELD | Admitting: Allergy and Immunology

## 2016-12-01 VITALS — BP 152/84 | HR 90 | Temp 98.6°F | Resp 18

## 2016-12-01 DIAGNOSIS — K219 Gastro-esophageal reflux disease without esophagitis: Secondary | ICD-10-CM | POA: Diagnosis not present

## 2016-12-01 DIAGNOSIS — J3089 Other allergic rhinitis: Secondary | ICD-10-CM

## 2016-12-01 DIAGNOSIS — J4550 Severe persistent asthma, uncomplicated: Secondary | ICD-10-CM

## 2016-12-01 MED ORDER — TIOTROPIUM BROMIDE MONOHYDRATE 18 MCG IN CAPS: ORAL_CAPSULE | RESPIRATORY_TRACT | 5 refills | Status: DC

## 2016-12-01 NOTE — Assessment & Plan Note (Signed)
   Continue appropriate allergen avoidance measures, montelukast 10 mg daily, and fluticasone nasal spray as needed.

## 2016-12-01 NOTE — Assessment & Plan Note (Signed)
   Continue reflux lifestyle modifications and omeprazole as prescribed. 

## 2016-12-01 NOTE — Progress Notes (Signed)
Follow-up Note  RE: Melanie Armstrong MRN: [id number] DOB: 1955/06/13 Date of Office Visit: 12/01/2016  Primary care provider: [name], MD Referring provider: [name], MD  History of present illness: Melanie Armstrong is a 61 y.o. female with persistent asthma, allergic rhinitis, and gastroesophageal reflux presents today for follow up.  She was last seen in this clinic on 07/28/2016.  She reports that in the interval since her revisit visit her asthma has been well-controlled on her current treatment plan.  The only time that she requires albuterol rescue as if she comes in to contact with strong perfumes or colognes.  In fact, she will uses albuterol prophylactically at the first detection of a strong scent in her environment.  She has no nasal symptom complaints today   Assessment and plan: Severe persistent asthma Stable on current treatment plan.  Continue omalizumab injections monthly, Symbicort 160/4.5 g 2 inhalations via spacer device twice a day, Spiriva 18 g daily, montelukast 10 mg daily, and albuterol every 4-6 hours as needed.  Allergic rhinitis  Continue appropriate allergen avoidance measures, montelukast 10 mg daily, and fluticasone nasal spray as needed.  GERD (gastroesophageal reflux disease)  Continue reflux lifestyle modifications and omeprazole as prescribed.   Meds ordered this encounter  Medications  . tiotropium (SPIRIVA HANDIHALER) 18 MCG inhalation capsule    Sig: INHALE 1 PUFF BY MOUTH EVERY MORNING TO PREVENT COUGH OR WHEEZE    Dispense:  30 capsule    Refill:  5    Please hold until patient needs.    Diagnostics: Spirometry:  Normal with an FEV1 of 108% predicted.  Please see scanned spirometry results for details.    Physical examination: Blood pressure (!) 152/84, pulse 90, temperature 98.6 F (37 C), temperature source Oral, resp. rate 18, SpO2 97 %.  General: Alert, interactive, in no acute distress. HEENT: TMs pearly gray,  turbinates mildly edematous without discharge, post-pharynx unremarkable. Neck: Supple without lymphadenopathy. Lungs: Clear to auscultation without wheezing, rhonchi or rales. CV: Normal S1, S2 without murmurs. Skin: Warm and dry, without lesions or rashes.  The following portions of the patient's history were reviewed and updated as appropriate: allergies, current medications, past family history, past medical history, past social history, past surgical history and problem list.  Allergies as of 12/01/2016      Reactions   Avelox [moxifloxacin Hcl In Nacl] Other (See Comments)   Muscle Aches   Levofloxacin Other (See Comments)   Tendon pain      Medication List       Accurate as of 12/01/16  1:36 PM. Always use your most recent med list.          albuterol 108 (90 Base) MCG/ACT inhaler Commonly known as:  PROAIR HFA Inhale 2 puffs into the lungs every 4 (four) hours as needed for wheezing or shortness of breath.   albuterol (2.5 MG/3ML) 0.083% nebulizer solution Commonly known as:  PROVENTIL Take 3 mLs (2.5 mg total) by nebulization every 4 (four) hours as needed for wheezing or shortness of breath.   Azelastine HCl 0.15 % Soln Place 2 sprays into the nose daily.   budesonide-formoterol 160-4.5 MCG/ACT inhaler Commonly known as:  SYMBICORT Inhale 2 puffs into the lungs 2 (two) times daily.   calcium-vitamin D 500-200 MG-UNIT tablet Commonly known as:  OSCAL WITH D Take 1 tablet by mouth daily with breakfast.   diazepam 5 MG tablet Commonly known as:  VALIUM Take 5 mg by mouth every 6 (  six) hours as needed for anxiety.   EPINEPHrine 0.3 mg/0.3 mL Soaj injection Commonly known as:  EPIPEN 2-PAK USE AS DIRECTED FOR SEVERE ALLERGIC REACTION   ferrous sulfate 325 (65 FE) MG tablet Take 1 tablet (325 mg total) by mouth 3 (three) times daily after meals.   fexofenadine 180 MG tablet Commonly known as:  ALLEGRA Take 180 mg by mouth daily.   fluticasone 50 MCG/ACT  nasal spray Commonly known as:  FLONASE Place 1-2 sprays into both nostrils daily as needed for allergies or rhinitis.   furosemide 20 MG tablet Commonly known as:  LASIX Take 20 mg by mouth every morning.   glucosamine-chondroitin 500-400 MG tablet Take 1 tablet by mouth 2 (two) times daily.   lisinopril 40 MG tablet Commonly known as:  PRINIVIL,ZESTRIL Take 40 mg by mouth at bedtime.   montelukast 10 MG tablet Commonly known as:  SINGULAIR Take 1 tablet (10 mg total) by mouth at bedtime.   multivitamin with minerals Tabs tablet Take 1 tablet by mouth daily.   OMEGA 3 PO Take 1 capsule by mouth daily.   omeprazole 20 MG capsule Commonly known as:  PRILOSEC Take 1 capsule (20 mg total) by mouth daily.   PRESCRIPTION MEDICATION once a week.   tiotropium 18 MCG inhalation capsule Commonly known as:  SPIRIVA HANDIHALER INHALE 1 PUFF BY MOUTH EVERY MORNING TO PREVENT COUGH OR WHEEZE   vitamin C 500 MG tablet Commonly known as:  ASCORBIC ACID Take 500 mg by mouth daily.   XOLAIR 150 MG injection Generic drug:  omalizumab INJECT 300 MG SUBCUTANEOUSLY EVERY 4 WEEKS -REFRIGERATE DO NOT FREEZE       Allergies  Allergen Reactions  . Avelox [Moxifloxacin Hcl In Nacl] Other (See Comments)    Muscle Aches  . Levofloxacin Other (See Comments)    Tendon pain    I appreciate the opportunity to take part in Santosha's care. Please do not hesitate to contact me with questions.  Sincerely,   R. Edgar Frisk, MD

## 2016-12-01 NOTE — Patient Instructions (Addendum)
Severe persistent asthma Stable on current treatment plan.  Continue omalizumab injections monthly, Symbicort 160/4.5 g 2 inhalations via spacer device twice a day, Spiriva 18 g daily, montelukast 10 mg daily, and albuterol every 4-6 hours as needed.  Allergic rhinitis  Continue appropriate allergen avoidance measures, montelukast 10 mg daily, and fluticasone nasal spray as needed.  GERD (gastroesophageal reflux disease)  Continue reflux lifestyle modifications and omeprazole as prescribed.   Return in about 5 months (around 05/03/2017), or if symptoms worsen or fail to improve.

## 2016-12-01 NOTE — Assessment & Plan Note (Signed)
Stable on current treatment plan.  Continue omalizumab injections monthly, Symbicort 160/4.5 g 2 inhalations via spacer device twice a day, Spiriva 18 g daily, montelukast 10 mg daily, and albuterol every 4-6 hours as needed.

## 2016-12-02 ENCOUNTER — Other Ambulatory Visit: Payer: Self-pay | Admitting: Allergy and Immunology

## 2016-12-08 ENCOUNTER — Ambulatory Visit (INDEPENDENT_AMBULATORY_CARE_PROVIDER_SITE_OTHER): Payer: BLUE CROSS/BLUE SHIELD

## 2016-12-08 DIAGNOSIS — J309 Allergic rhinitis, unspecified: Secondary | ICD-10-CM

## 2016-12-10 ENCOUNTER — Ambulatory Visit (INDEPENDENT_AMBULATORY_CARE_PROVIDER_SITE_OTHER): Payer: BLUE CROSS/BLUE SHIELD

## 2016-12-10 DIAGNOSIS — J4550 Severe persistent asthma, uncomplicated: Secondary | ICD-10-CM | POA: Diagnosis not present

## 2016-12-16 ENCOUNTER — Ambulatory Visit (INDEPENDENT_AMBULATORY_CARE_PROVIDER_SITE_OTHER): Payer: BLUE CROSS/BLUE SHIELD

## 2016-12-16 DIAGNOSIS — J309 Allergic rhinitis, unspecified: Secondary | ICD-10-CM

## 2016-12-18 NOTE — Progress Notes (Signed)
MT VIAL MADE EXP. 12/19/17

## 2016-12-19 DIAGNOSIS — J301 Allergic rhinitis due to pollen: Secondary | ICD-10-CM | POA: Diagnosis not present

## 2016-12-22 ENCOUNTER — Ambulatory Visit (INDEPENDENT_AMBULATORY_CARE_PROVIDER_SITE_OTHER): Payer: BLUE CROSS/BLUE SHIELD

## 2016-12-22 DIAGNOSIS — J309 Allergic rhinitis, unspecified: Secondary | ICD-10-CM | POA: Diagnosis not present

## 2016-12-29 ENCOUNTER — Ambulatory Visit (INDEPENDENT_AMBULATORY_CARE_PROVIDER_SITE_OTHER): Payer: BLUE CROSS/BLUE SHIELD

## 2016-12-29 DIAGNOSIS — J309 Allergic rhinitis, unspecified: Secondary | ICD-10-CM

## 2017-01-02 DIAGNOSIS — H401131 Primary open-angle glaucoma, bilateral, mild stage: Secondary | ICD-10-CM | POA: Diagnosis not present

## 2017-01-02 DIAGNOSIS — H5213 Myopia, bilateral: Secondary | ICD-10-CM | POA: Diagnosis not present

## 2017-01-05 ENCOUNTER — Ambulatory Visit (INDEPENDENT_AMBULATORY_CARE_PROVIDER_SITE_OTHER): Payer: BLUE CROSS/BLUE SHIELD | Admitting: *Deleted

## 2017-01-05 DIAGNOSIS — J309 Allergic rhinitis, unspecified: Secondary | ICD-10-CM

## 2017-01-07 ENCOUNTER — Ambulatory Visit (INDEPENDENT_AMBULATORY_CARE_PROVIDER_SITE_OTHER): Payer: BLUE CROSS/BLUE SHIELD

## 2017-01-07 DIAGNOSIS — J4550 Severe persistent asthma, uncomplicated: Secondary | ICD-10-CM

## 2017-01-12 ENCOUNTER — Ambulatory Visit (INDEPENDENT_AMBULATORY_CARE_PROVIDER_SITE_OTHER): Payer: BLUE CROSS/BLUE SHIELD | Admitting: *Deleted

## 2017-01-12 DIAGNOSIS — J309 Allergic rhinitis, unspecified: Secondary | ICD-10-CM | POA: Diagnosis not present

## 2017-01-19 ENCOUNTER — Ambulatory Visit (INDEPENDENT_AMBULATORY_CARE_PROVIDER_SITE_OTHER): Payer: BLUE CROSS/BLUE SHIELD | Admitting: *Deleted

## 2017-01-19 DIAGNOSIS — J309 Allergic rhinitis, unspecified: Secondary | ICD-10-CM

## 2017-01-27 ENCOUNTER — Ambulatory Visit (INDEPENDENT_AMBULATORY_CARE_PROVIDER_SITE_OTHER): Payer: BLUE CROSS/BLUE SHIELD | Admitting: *Deleted

## 2017-01-27 DIAGNOSIS — J309 Allergic rhinitis, unspecified: Secondary | ICD-10-CM | POA: Diagnosis not present

## 2017-01-29 DIAGNOSIS — J4550 Severe persistent asthma, uncomplicated: Secondary | ICD-10-CM | POA: Diagnosis not present

## 2017-02-04 ENCOUNTER — Ambulatory Visit (INDEPENDENT_AMBULATORY_CARE_PROVIDER_SITE_OTHER): Payer: BLUE CROSS/BLUE SHIELD | Admitting: *Deleted

## 2017-02-04 DIAGNOSIS — J4550 Severe persistent asthma, uncomplicated: Secondary | ICD-10-CM

## 2017-02-06 ENCOUNTER — Ambulatory Visit (INDEPENDENT_AMBULATORY_CARE_PROVIDER_SITE_OTHER): Payer: BLUE CROSS/BLUE SHIELD

## 2017-02-06 DIAGNOSIS — R600 Localized edema: Secondary | ICD-10-CM | POA: Diagnosis not present

## 2017-02-06 DIAGNOSIS — J309 Allergic rhinitis, unspecified: Secondary | ICD-10-CM

## 2017-02-06 DIAGNOSIS — J4520 Mild intermittent asthma, uncomplicated: Secondary | ICD-10-CM | POA: Diagnosis not present

## 2017-02-06 DIAGNOSIS — I10 Essential (primary) hypertension: Secondary | ICD-10-CM | POA: Diagnosis not present

## 2017-02-11 ENCOUNTER — Ambulatory Visit (INDEPENDENT_AMBULATORY_CARE_PROVIDER_SITE_OTHER): Payer: BLUE CROSS/BLUE SHIELD | Admitting: *Deleted

## 2017-02-11 DIAGNOSIS — J309 Allergic rhinitis, unspecified: Secondary | ICD-10-CM

## 2017-02-16 ENCOUNTER — Ambulatory Visit (INDEPENDENT_AMBULATORY_CARE_PROVIDER_SITE_OTHER): Payer: BLUE CROSS/BLUE SHIELD | Admitting: *Deleted

## 2017-02-16 DIAGNOSIS — J309 Allergic rhinitis, unspecified: Secondary | ICD-10-CM

## 2017-02-23 ENCOUNTER — Ambulatory Visit (INDEPENDENT_AMBULATORY_CARE_PROVIDER_SITE_OTHER): Payer: BLUE CROSS/BLUE SHIELD | Admitting: *Deleted

## 2017-02-23 DIAGNOSIS — J309 Allergic rhinitis, unspecified: Secondary | ICD-10-CM | POA: Diagnosis not present

## 2017-02-26 DIAGNOSIS — M1612 Unilateral primary osteoarthritis, left hip: Secondary | ICD-10-CM | POA: Diagnosis not present

## 2017-02-26 NOTE — Progress Notes (Signed)
VIAL EXP 02-27-18

## 2017-02-27 DIAGNOSIS — J4550 Severe persistent asthma, uncomplicated: Secondary | ICD-10-CM | POA: Diagnosis not present

## 2017-02-27 DIAGNOSIS — J3089 Other allergic rhinitis: Secondary | ICD-10-CM | POA: Diagnosis not present

## 2017-03-02 ENCOUNTER — Ambulatory Visit (INDEPENDENT_AMBULATORY_CARE_PROVIDER_SITE_OTHER): Payer: BLUE CROSS/BLUE SHIELD | Admitting: *Deleted

## 2017-03-02 DIAGNOSIS — J309 Allergic rhinitis, unspecified: Secondary | ICD-10-CM | POA: Diagnosis not present

## 2017-03-03 ENCOUNTER — Ambulatory Visit (INDEPENDENT_AMBULATORY_CARE_PROVIDER_SITE_OTHER): Payer: BLUE CROSS/BLUE SHIELD

## 2017-03-03 ENCOUNTER — Ambulatory Visit: Payer: Self-pay

## 2017-03-03 DIAGNOSIS — J4550 Severe persistent asthma, uncomplicated: Secondary | ICD-10-CM

## 2017-03-03 DIAGNOSIS — J4540 Moderate persistent asthma, uncomplicated: Secondary | ICD-10-CM

## 2017-03-04 ENCOUNTER — Ambulatory Visit: Payer: BLUE CROSS/BLUE SHIELD

## 2017-03-09 ENCOUNTER — Ambulatory Visit (INDEPENDENT_AMBULATORY_CARE_PROVIDER_SITE_OTHER): Payer: BLUE CROSS/BLUE SHIELD

## 2017-03-09 DIAGNOSIS — J309 Allergic rhinitis, unspecified: Secondary | ICD-10-CM | POA: Diagnosis not present

## 2017-03-12 ENCOUNTER — Telehealth: Payer: Self-pay | Admitting: Allergy and Immunology

## 2017-03-12 ENCOUNTER — Other Ambulatory Visit: Payer: Self-pay | Admitting: Allergy and Immunology

## 2017-03-12 DIAGNOSIS — J4550 Severe persistent asthma, uncomplicated: Secondary | ICD-10-CM

## 2017-03-12 MED ORDER — EPINEPHRINE 0.3 MG/0.3ML IJ SOAJ: INTRAMUSCULAR | 1 refills | Status: DC

## 2017-03-12 NOTE — Telephone Encounter (Signed)
Patient is requesting a new Epi Pen. Hers expires Nov 2018. She said [facility] in [place] has sent a request.

## 2017-03-12 NOTE — Telephone Encounter (Signed)
Please see previous note.

## 2017-03-12 NOTE — Telephone Encounter (Signed)
rx sent in 

## 2017-03-12 NOTE — Telephone Encounter (Signed)
Patient called and is requesting a new Epi Pen. Hers expires November 2018. [facility] in [place].

## 2017-03-16 ENCOUNTER — Ambulatory Visit (INDEPENDENT_AMBULATORY_CARE_PROVIDER_SITE_OTHER): Payer: BLUE CROSS/BLUE SHIELD | Admitting: *Deleted

## 2017-03-16 DIAGNOSIS — J309 Allergic rhinitis, unspecified: Secondary | ICD-10-CM

## 2017-03-25 ENCOUNTER — Ambulatory Visit (INDEPENDENT_AMBULATORY_CARE_PROVIDER_SITE_OTHER): Payer: BLUE CROSS/BLUE SHIELD | Admitting: *Deleted

## 2017-03-25 DIAGNOSIS — J309 Allergic rhinitis, unspecified: Secondary | ICD-10-CM

## 2017-03-29 DIAGNOSIS — J4550 Severe persistent asthma, uncomplicated: Secondary | ICD-10-CM | POA: Diagnosis not present

## 2017-03-30 ENCOUNTER — Ambulatory Visit (INDEPENDENT_AMBULATORY_CARE_PROVIDER_SITE_OTHER): Payer: BLUE CROSS/BLUE SHIELD | Admitting: Allergy & Immunology

## 2017-03-30 DIAGNOSIS — J309 Allergic rhinitis, unspecified: Secondary | ICD-10-CM | POA: Diagnosis not present

## 2017-03-31 ENCOUNTER — Ambulatory Visit (INDEPENDENT_AMBULATORY_CARE_PROVIDER_SITE_OTHER): Payer: BLUE CROSS/BLUE SHIELD | Admitting: *Deleted

## 2017-03-31 DIAGNOSIS — J4550 Severe persistent asthma, uncomplicated: Secondary | ICD-10-CM | POA: Diagnosis not present

## 2017-04-08 ENCOUNTER — Ambulatory Visit (INDEPENDENT_AMBULATORY_CARE_PROVIDER_SITE_OTHER): Payer: BLUE CROSS/BLUE SHIELD

## 2017-04-08 DIAGNOSIS — J309 Allergic rhinitis, unspecified: Secondary | ICD-10-CM | POA: Diagnosis not present

## 2017-04-16 ENCOUNTER — Ambulatory Visit (INDEPENDENT_AMBULATORY_CARE_PROVIDER_SITE_OTHER): Payer: BLUE CROSS/BLUE SHIELD | Admitting: *Deleted

## 2017-04-16 DIAGNOSIS — J309 Allergic rhinitis, unspecified: Secondary | ICD-10-CM | POA: Diagnosis not present

## 2017-04-20 ENCOUNTER — Ambulatory Visit (INDEPENDENT_AMBULATORY_CARE_PROVIDER_SITE_OTHER): Payer: BLUE CROSS/BLUE SHIELD | Admitting: *Deleted

## 2017-04-20 DIAGNOSIS — J309 Allergic rhinitis, unspecified: Secondary | ICD-10-CM

## 2017-04-21 ENCOUNTER — Encounter: Payer: Self-pay | Admitting: Allergy and Immunology

## 2017-04-21 ENCOUNTER — Ambulatory Visit: Payer: BLUE CROSS/BLUE SHIELD | Admitting: Allergy and Immunology

## 2017-04-21 VITALS — BP 120/80 | HR 82 | Temp 98.0°F | Resp 18

## 2017-04-21 DIAGNOSIS — J4550 Severe persistent asthma, uncomplicated: Secondary | ICD-10-CM

## 2017-04-21 DIAGNOSIS — J3089 Other allergic rhinitis: Secondary | ICD-10-CM

## 2017-04-21 DIAGNOSIS — K219 Gastro-esophageal reflux disease without esophagitis: Secondary | ICD-10-CM | POA: Diagnosis not present

## 2017-04-21 MED ORDER — TIOTROPIUM BROMIDE MONOHYDRATE 1.25 MCG/ACT IN AERS: 2.0000 | INHALATION_SPRAY | Freq: Every day | RESPIRATORY_TRACT | 5 refills | Status: DC

## 2017-04-21 NOTE — Patient Instructions (Addendum)
Severe persistent asthma Well controlled on current treatment plan.  Continue omalizumab injections monthly, Symbicort 160/4.5 g 2 inhalations via spacer device twice a day, montelukast 10 mg daily, and albuterol every 4-6 hours as needed.  A prescription has been provided for Spiriva Respimat 1.25 g, 2 inhalations daily.  She will try this delivery system and let us know if she prefers it over the Spiriva HandiHaler.  Subjective and objective measures of pulmonary function will be followed and the treatment plan will be adjusted accordingly.  Allergic rhinitis Stable.  Continue appropriate allergen avoidance measures, montelukast 10 mg daily, and fluticasone nasal spray as needed.  Nasal saline spray (i.e. Simply Saline) is recommended prior to medicated nasal sprays and as needed.  GERD (gastroesophageal reflux disease) Well controlled on omeprazole. She has failed H2 antagonist and other proton pump inhibitors.  Continue reflux lifestyle modifications and omeprazole as prescribed.   Return in about 4 months (around 08/19/2017), or if symptoms worsen or fail to improve.

## 2017-04-21 NOTE — Progress Notes (Signed)
Follow-up Note  RE: Melanie Armstrong MRN: [id number] DOB: Aug 14, 1955 Date of Office Visit: 04/21/2017  Primary care provider: [name], MD Referring provider: [name], MD  History of present illness: Melanie Armstrong is a 62 y.o. female with persistent asthma, allergic rhinitis, and esophageal reflux presenting today for follow-up.  She was last seen in this clinic in August 2018.  She reports that her asthma has been well controlled in the interval since her previous visit.  She is currently taking Xolair injections every 4 weeks, Symbicort 160-4 0.5 g, Spiriva HandiHaler 18 g, 1 inhalation daily, 2 inhalations via spacer device twice daily, montelukast 10 mg daily, and albuterol HFA, 1-2 inhalations every 4-6 hours as needed.  She rarely requires albuterol rescue and does not experience nocturnal awakenings due to lower respiratory symptoms.  Her nasal allergy symptoms are currently well controlled.  She reports that omeprazole has done a good job of suppressing reflux and notes that she has tried H2 receptor antagonists and other proton pump inhibitors which have failed to alleviate her symptoms.   Assessment and plan: Severe persistent asthma Well controlled on current treatment plan.  Continue omalizumab injections monthly, Symbicort 160/4.5 g 2 inhalations via spacer device twice a day, montelukast 10 mg daily, and albuterol every 4-6 hours as needed.  A prescription has been provided for Spiriva Respimat 1.25 g, 2 inhalations daily.  She will try this delivery system and let us know if she prefers it over the Spiriva HandiHaler.  Subjective and objective measures of pulmonary function will be followed and the treatment plan will be adjusted accordingly.  Allergic rhinitis Stable.  Continue appropriate allergen avoidance measures, montelukast 10 mg daily, and fluticasone nasal spray as needed.  Nasal saline spray (i.e. Simply Saline) is recommended prior to  medicated nasal sprays and as needed.  GERD (gastroesophageal reflux disease) Well controlled on omeprazole. She has failed H2 antagonist and other proton pump inhibitors.  Continue reflux lifestyle modifications and omeprazole as prescribed.   Meds ordered this encounter  Medications  . Tiotropium Bromide Monohydrate (SPIRIVA RESPIMAT) 1.25 MCG/ACT AERS    Sig: Inhale 2 puffs into the lungs daily.    Dispense:  4 g    Refill:  5    Diagnostics: Spirometry:  Normal with an FEV1 of 109% predicted.  Please see scanned spirometry results for details.    Physical examination: Blood pressure 120/80, pulse 82, temperature 98 F (36.7 C), temperature source Oral, resp. rate 18, SpO2 97 %.  General: Alert, interactive, in no acute distress. HEENT: TMs pearly gray, turbinates mildly edematous without discharge, post-pharynx unremarkable. Neck: Supple without lymphadenopathy. Lungs: Clear to auscultation without wheezing, rhonchi or rales. CV: Normal S1, S2 without murmurs. Skin: Warm and dry, without lesions or rashes.  The following portions of the patient's history were reviewed and updated as appropriate: allergies, current medications, past family history, past medical history, past social history, past surgical history and problem list.  Allergies as of 04/21/2017      Reactions   Avelox [moxifloxacin Hcl In Nacl] Other (See Comments)   Muscle Aches   Levofloxacin Other (See Comments)   Tendon pain      Medication List        Accurate as of 04/21/17 12:44 PM. Always use your most recent med list.          albuterol 108 (90 Base) MCG/ACT inhaler Commonly known as:  PROAIR HFA Inhale 2 puffs into the lungs every 4 (  four) hours as needed for wheezing or shortness of breath.   albuterol (2.5 MG/3ML) 0.083% nebulizer solution Commonly known as:  PROVENTIL USE 1 VIAL IN NEBULIZER EVERY 4 HOURS AS NEEDED FOR WHEEZING OR  SHORTNESS  OF  BREATH   Azelastine HCl 0.15 %  Soln Place 2 sprays into the nose daily.   budesonide-formoterol 160-4.5 MCG/ACT inhaler Commonly known as:  SYMBICORT Inhale 2 puffs into the lungs 2 (two) times daily.   calcium-vitamin D 500-200 MG-UNIT tablet Commonly known as:  OSCAL WITH D Take 1 tablet by mouth daily with breakfast.   diazepam 5 MG tablet Commonly known as:  VALIUM Take 5 mg by mouth every 6 (six) hours as needed for anxiety.   EPINEPHrine 0.3 mg/0.3 mL Soaj injection Commonly known as:  EPIPEN 2-PAK USE AS DIRECTED FOR SEVERE ALLERGIC REACTION   ferrous sulfate 325 (65 FE) MG tablet Take 1 tablet (325 mg total) by mouth 3 (three) times daily after meals.   fexofenadine 180 MG tablet Commonly known as:  ALLEGRA Take 180 mg by mouth daily.   fluticasone 50 MCG/ACT nasal spray Commonly known as:  FLONASE Place 1-2 sprays into both nostrils daily as needed for allergies or rhinitis.   furosemide 20 MG tablet Commonly known as:  LASIX Take 20 mg by mouth every morning.   glucosamine-chondroitin 500-400 MG tablet Take 1 tablet by mouth 2 (two) times daily.   lisinopril 40 MG tablet Commonly known as:  PRINIVIL,ZESTRIL Take 40 mg by mouth at bedtime.   montelukast 10 MG tablet Commonly known as:  SINGULAIR Take 1 tablet (10 mg total) by mouth at bedtime.   multivitamin with minerals Tabs tablet Take 1 tablet by mouth daily.   OMEGA 3 PO Take 1 capsule by mouth daily.   omeprazole 20 MG capsule Commonly known as:  PRILOSEC Take 1 capsule (20 mg total) by mouth daily.   PRESCRIPTION MEDICATION once a week.   tiotropium 18 MCG inhalation capsule Commonly known as:  SPIRIVA HANDIHALER INHALE 1 PUFF BY MOUTH EVERY MORNING TO PREVENT COUGH OR WHEEZE   Tiotropium Bromide Monohydrate 1.25 MCG/ACT Aers Commonly known as:  SPIRIVA RESPIMAT Inhale 2 puffs into the lungs daily.   vitamin C 500 MG tablet Commonly known as:  ASCORBIC ACID Take 500 mg by mouth daily.   XOLAIR 150 MG  injection Generic drug:  omalizumab INJECT 300 MG SUBCUTANEOUSLY EVERY 4 WEEKS -REFRIGERATE DO NOT FREEZE       Allergies  Allergen Reactions  . Avelox [Moxifloxacin Hcl In Nacl] Other (See Comments)    Muscle Aches  . Levofloxacin Other (See Comments)    Tendon pain    I appreciate the opportunity to take part in Trinka's care. Please do not hesitate to contact me with questions.  Sincerely,   R. Edgar Frisk, MD

## 2017-04-21 NOTE — Assessment & Plan Note (Addendum)
Stable.  Continue appropriate allergen avoidance measures, montelukast 10 mg daily, and fluticasone nasal spray as needed.  Nasal saline spray (i.e. Simply Saline) is recommended prior to medicated nasal sprays and as needed.

## 2017-04-21 NOTE — Assessment & Plan Note (Addendum)
Well controlled on current treatment plan.  Continue omalizumab injections monthly, Symbicort 160/4.5 g 2 inhalations via spacer device twice a day, montelukast 10 mg daily, and albuterol every 4-6 hours as needed.  A prescription has been provided for Spiriva Respimat 1.25 g, 2 inhalations daily.  She will try this delivery system and let us know if she prefers it over the Spiriva HandiHaler.  Subjective and objective measures of pulmonary function will be followed and the treatment plan will be adjusted accordingly.

## 2017-04-21 NOTE — Assessment & Plan Note (Signed)
Well controlled on omeprazole. She has failed H2 antagonist and other proton pump inhibitors.  Continue reflux lifestyle modifications and omeprazole as prescribed.

## 2017-04-27 ENCOUNTER — Ambulatory Visit (INDEPENDENT_AMBULATORY_CARE_PROVIDER_SITE_OTHER): Payer: BLUE CROSS/BLUE SHIELD | Admitting: *Deleted

## 2017-04-27 DIAGNOSIS — J309 Allergic rhinitis, unspecified: Secondary | ICD-10-CM

## 2017-04-28 ENCOUNTER — Ambulatory Visit (INDEPENDENT_AMBULATORY_CARE_PROVIDER_SITE_OTHER): Payer: BLUE CROSS/BLUE SHIELD | Admitting: *Deleted

## 2017-04-28 DIAGNOSIS — J4550 Severe persistent asthma, uncomplicated: Secondary | ICD-10-CM | POA: Diagnosis not present

## 2017-05-04 ENCOUNTER — Ambulatory Visit (INDEPENDENT_AMBULATORY_CARE_PROVIDER_SITE_OTHER): Payer: BLUE CROSS/BLUE SHIELD | Admitting: *Deleted

## 2017-05-04 DIAGNOSIS — J309 Allergic rhinitis, unspecified: Secondary | ICD-10-CM

## 2017-05-11 DIAGNOSIS — D126 Benign neoplasm of colon, unspecified: Secondary | ICD-10-CM | POA: Diagnosis not present

## 2017-05-11 DIAGNOSIS — Z86010 Personal history of colonic polyps: Secondary | ICD-10-CM | POA: Diagnosis not present

## 2017-05-11 DIAGNOSIS — K5730 Diverticulosis of large intestine without perforation or abscess without bleeding: Secondary | ICD-10-CM | POA: Diagnosis not present

## 2017-05-12 ENCOUNTER — Ambulatory Visit (INDEPENDENT_AMBULATORY_CARE_PROVIDER_SITE_OTHER): Payer: BLUE CROSS/BLUE SHIELD | Admitting: *Deleted

## 2017-05-12 DIAGNOSIS — J309 Allergic rhinitis, unspecified: Secondary | ICD-10-CM

## 2017-05-14 DIAGNOSIS — D126 Benign neoplasm of colon, unspecified: Secondary | ICD-10-CM | POA: Diagnosis not present

## 2017-05-18 ENCOUNTER — Ambulatory Visit (INDEPENDENT_AMBULATORY_CARE_PROVIDER_SITE_OTHER): Payer: BLUE CROSS/BLUE SHIELD | Admitting: *Deleted

## 2017-05-18 DIAGNOSIS — J309 Allergic rhinitis, unspecified: Secondary | ICD-10-CM

## 2017-05-19 DIAGNOSIS — J3089 Other allergic rhinitis: Secondary | ICD-10-CM | POA: Diagnosis not present

## 2017-05-21 NOTE — Progress Notes (Signed)
VIAL EXP 05-22-18

## 2017-05-25 ENCOUNTER — Ambulatory Visit (INDEPENDENT_AMBULATORY_CARE_PROVIDER_SITE_OTHER): Payer: BLUE CROSS/BLUE SHIELD | Admitting: *Deleted

## 2017-05-25 DIAGNOSIS — J309 Allergic rhinitis, unspecified: Secondary | ICD-10-CM | POA: Diagnosis not present

## 2017-05-26 ENCOUNTER — Ambulatory Visit (INDEPENDENT_AMBULATORY_CARE_PROVIDER_SITE_OTHER): Payer: BLUE CROSS/BLUE SHIELD | Admitting: *Deleted

## 2017-05-26 DIAGNOSIS — J4550 Severe persistent asthma, uncomplicated: Secondary | ICD-10-CM | POA: Diagnosis not present

## 2017-06-01 ENCOUNTER — Ambulatory Visit (INDEPENDENT_AMBULATORY_CARE_PROVIDER_SITE_OTHER): Payer: BLUE CROSS/BLUE SHIELD | Admitting: *Deleted

## 2017-06-01 DIAGNOSIS — J309 Allergic rhinitis, unspecified: Secondary | ICD-10-CM | POA: Diagnosis not present

## 2017-06-04 DIAGNOSIS — H40053 Ocular hypertension, bilateral: Secondary | ICD-10-CM | POA: Diagnosis not present

## 2017-06-08 DIAGNOSIS — Z96641 Presence of right artificial hip joint: Secondary | ICD-10-CM | POA: Diagnosis not present

## 2017-06-08 DIAGNOSIS — M25552 Pain in left hip: Secondary | ICD-10-CM | POA: Insufficient documentation

## 2017-06-09 ENCOUNTER — Ambulatory Visit (INDEPENDENT_AMBULATORY_CARE_PROVIDER_SITE_OTHER): Payer: BLUE CROSS/BLUE SHIELD | Admitting: *Deleted

## 2017-06-09 DIAGNOSIS — J309 Allergic rhinitis, unspecified: Secondary | ICD-10-CM

## 2017-06-15 ENCOUNTER — Ambulatory Visit (INDEPENDENT_AMBULATORY_CARE_PROVIDER_SITE_OTHER): Payer: BLUE CROSS/BLUE SHIELD | Admitting: *Deleted

## 2017-06-15 DIAGNOSIS — J309 Allergic rhinitis, unspecified: Secondary | ICD-10-CM | POA: Diagnosis not present

## 2017-06-17 ENCOUNTER — Other Ambulatory Visit: Payer: Self-pay | Admitting: Allergy and Immunology

## 2017-06-22 ENCOUNTER — Ambulatory Visit (INDEPENDENT_AMBULATORY_CARE_PROVIDER_SITE_OTHER): Payer: BLUE CROSS/BLUE SHIELD | Admitting: *Deleted

## 2017-06-22 DIAGNOSIS — J309 Allergic rhinitis, unspecified: Secondary | ICD-10-CM | POA: Diagnosis not present

## 2017-06-22 MED ORDER — ALBUTEROL SULFATE (2.5 MG/3ML) 0.083% IN NEBU: INHALATION_SOLUTION | RESPIRATORY_TRACT | 0 refills | Status: AC

## 2017-06-23 ENCOUNTER — Ambulatory Visit: Payer: BLUE CROSS/BLUE SHIELD

## 2017-06-23 DIAGNOSIS — J4550 Severe persistent asthma, uncomplicated: Secondary | ICD-10-CM | POA: Diagnosis not present

## 2017-06-24 ENCOUNTER — Ambulatory Visit (INDEPENDENT_AMBULATORY_CARE_PROVIDER_SITE_OTHER): Payer: BLUE CROSS/BLUE SHIELD | Admitting: *Deleted

## 2017-06-24 DIAGNOSIS — J4550 Severe persistent asthma, uncomplicated: Secondary | ICD-10-CM

## 2017-06-25 DIAGNOSIS — M25552 Pain in left hip: Secondary | ICD-10-CM | POA: Diagnosis not present

## 2017-06-29 ENCOUNTER — Ambulatory Visit (INDEPENDENT_AMBULATORY_CARE_PROVIDER_SITE_OTHER): Payer: BLUE CROSS/BLUE SHIELD | Admitting: *Deleted

## 2017-06-29 DIAGNOSIS — J309 Allergic rhinitis, unspecified: Secondary | ICD-10-CM

## 2017-07-06 ENCOUNTER — Ambulatory Visit (INDEPENDENT_AMBULATORY_CARE_PROVIDER_SITE_OTHER): Payer: BLUE CROSS/BLUE SHIELD | Admitting: *Deleted

## 2017-07-06 DIAGNOSIS — J309 Allergic rhinitis, unspecified: Secondary | ICD-10-CM

## 2017-07-13 ENCOUNTER — Ambulatory Visit (INDEPENDENT_AMBULATORY_CARE_PROVIDER_SITE_OTHER): Payer: BLUE CROSS/BLUE SHIELD | Admitting: *Deleted

## 2017-07-13 DIAGNOSIS — J309 Allergic rhinitis, unspecified: Secondary | ICD-10-CM

## 2017-07-20 ENCOUNTER — Ambulatory Visit (INDEPENDENT_AMBULATORY_CARE_PROVIDER_SITE_OTHER): Payer: BLUE CROSS/BLUE SHIELD

## 2017-07-20 DIAGNOSIS — J309 Allergic rhinitis, unspecified: Secondary | ICD-10-CM

## 2017-07-21 ENCOUNTER — Ambulatory Visit (INDEPENDENT_AMBULATORY_CARE_PROVIDER_SITE_OTHER): Payer: BLUE CROSS/BLUE SHIELD | Admitting: *Deleted

## 2017-07-21 DIAGNOSIS — J4550 Severe persistent asthma, uncomplicated: Secondary | ICD-10-CM

## 2017-07-22 ENCOUNTER — Ambulatory Visit: Payer: BLUE CROSS/BLUE SHIELD

## 2017-07-22 DIAGNOSIS — M25552 Pain in left hip: Secondary | ICD-10-CM | POA: Diagnosis not present

## 2017-07-22 DIAGNOSIS — M1612 Unilateral primary osteoarthritis, left hip: Secondary | ICD-10-CM | POA: Diagnosis not present

## 2017-07-24 DIAGNOSIS — J301 Allergic rhinitis due to pollen: Secondary | ICD-10-CM | POA: Diagnosis not present

## 2017-07-24 DIAGNOSIS — J4521 Mild intermittent asthma with (acute) exacerbation: Secondary | ICD-10-CM | POA: Diagnosis not present

## 2017-07-24 DIAGNOSIS — J0101 Acute recurrent maxillary sinusitis: Secondary | ICD-10-CM | POA: Diagnosis not present

## 2017-07-27 DIAGNOSIS — N950 Postmenopausal bleeding: Secondary | ICD-10-CM | POA: Diagnosis not present

## 2017-07-27 DIAGNOSIS — Z6836 Body mass index (BMI) 36.0-36.9, adult: Secondary | ICD-10-CM | POA: Diagnosis not present

## 2017-07-29 ENCOUNTER — Encounter: Payer: Self-pay | Admitting: *Deleted

## 2017-07-29 NOTE — Progress Notes (Signed)
Maintenance vial made. Exp: 07-30-18. hv 

## 2017-07-31 DIAGNOSIS — J3089 Other allergic rhinitis: Secondary | ICD-10-CM | POA: Diagnosis not present

## 2017-08-04 ENCOUNTER — Ambulatory Visit (INDEPENDENT_AMBULATORY_CARE_PROVIDER_SITE_OTHER): Payer: BLUE CROSS/BLUE SHIELD | Admitting: *Deleted

## 2017-08-04 DIAGNOSIS — J309 Allergic rhinitis, unspecified: Secondary | ICD-10-CM

## 2017-08-10 ENCOUNTER — Ambulatory Visit (INDEPENDENT_AMBULATORY_CARE_PROVIDER_SITE_OTHER): Payer: BLUE CROSS/BLUE SHIELD | Admitting: *Deleted

## 2017-08-10 DIAGNOSIS — J309 Allergic rhinitis, unspecified: Secondary | ICD-10-CM | POA: Diagnosis not present

## 2017-08-14 ENCOUNTER — Ambulatory Visit
Admission: RE | Admit: 2017-08-14 | Discharge: 2017-08-14 | Disposition: A | Discharge status: Home / Self Care | Payer: BLUE CROSS/BLUE SHIELD | Source: Ambulatory Visit | Attending: Family Medicine | Admitting: Family Medicine

## 2017-08-14 ENCOUNTER — Other Ambulatory Visit: Payer: Self-pay | Admitting: Family Medicine

## 2017-08-14 DIAGNOSIS — R053 Chronic cough: Secondary | ICD-10-CM

## 2017-08-14 DIAGNOSIS — J301 Allergic rhinitis due to pollen: Secondary | ICD-10-CM | POA: Diagnosis not present

## 2017-08-14 DIAGNOSIS — K219 Gastro-esophageal reflux disease without esophagitis: Secondary | ICD-10-CM | POA: Diagnosis not present

## 2017-08-14 DIAGNOSIS — J4520 Mild intermittent asthma, uncomplicated: Secondary | ICD-10-CM | POA: Diagnosis not present

## 2017-08-14 DIAGNOSIS — R05 Cough: Secondary | ICD-10-CM

## 2017-08-17 ENCOUNTER — Ambulatory Visit (INDEPENDENT_AMBULATORY_CARE_PROVIDER_SITE_OTHER): Payer: BLUE CROSS/BLUE SHIELD | Admitting: *Deleted

## 2017-08-17 DIAGNOSIS — J309 Allergic rhinitis, unspecified: Secondary | ICD-10-CM

## 2017-08-18 ENCOUNTER — Encounter: Payer: Self-pay | Admitting: Allergy and Immunology

## 2017-08-18 ENCOUNTER — Other Ambulatory Visit: Payer: Self-pay

## 2017-08-18 ENCOUNTER — Ambulatory Visit: Payer: BLUE CROSS/BLUE SHIELD | Admitting: Allergy and Immunology

## 2017-08-18 ENCOUNTER — Other Ambulatory Visit: Payer: Self-pay | Admitting: Family Medicine

## 2017-08-18 VITALS — BP 140/86 | HR 86 | Temp 98.3°F | Resp 16 | Ht 63.0 in | Wt 225.0 lb

## 2017-08-18 DIAGNOSIS — J4550 Severe persistent asthma, uncomplicated: Secondary | ICD-10-CM | POA: Diagnosis not present

## 2017-08-18 DIAGNOSIS — K219 Gastro-esophageal reflux disease without esophagitis: Secondary | ICD-10-CM

## 2017-08-18 DIAGNOSIS — R9389 Abnormal findings on diagnostic imaging of other specified body structures: Secondary | ICD-10-CM

## 2017-08-18 DIAGNOSIS — J3089 Other allergic rhinitis: Secondary | ICD-10-CM

## 2017-08-18 MED ORDER — BUDESONIDE-FORMOTEROL FUMARATE 160-4.5 MCG/ACT IN AERO: 2.0000 | INHALATION_SPRAY | Freq: Two times a day (BID) | RESPIRATORY_TRACT | 5 refills | Status: DC

## 2017-08-18 NOTE — Assessment & Plan Note (Signed)
   Continue omalizumab injections monthly, Symbicort 160/4.5 g 2 inhalations via spacer device twice a day, Spiriva Respimat 1.25 g, 2 inhalations daily, montelukast 10 mg daily, and albuterol every 4-6 hours as needed.  Subjective and objective measures of pulmonary function will be followed and the treatment plan will be adjusted accordingly.

## 2017-08-18 NOTE — Assessment & Plan Note (Signed)
   Continue appropriate allergen avoidance measures, montelukast 10 mg daily, and fluticasone nasal spray as needed.  Nasal saline lavage (NeilMed) has been recommended as needed and prior to medicated nasal sprays along with instructions for proper administration.  For thick post nasal drainage, add guaifenesin [phone] mg (Mucinex)  twice daily as needed with adequate hydration as discussed.

## 2017-08-18 NOTE — Progress Notes (Signed)
Follow-up Note  RE: Melanie Armstrong MRN: [id number] DOB: December 21, 1955 Date of Office Visit: 08/18/2017  Primary care provider: [name], MD Referring provider: [name], MD  History of present illness: Melanie Armstrong is a 62 y.o. female with persistent asthma, allergic rhinitis, and esophageal reflux presenting today for follow-up.  She was last seen in this clinic in January 2019.  She reports that her upper and lower respiratory symptoms have been well controlled in the interval since her previous visit until early April.  At that time, she was diagnosed with a sinus infection and bronchitis.  She was initially prescribed clarithromycin without perceived benefit, so she was switched to Ceftin 500 mg twice daily x10 days with benefit.  She reports that she is still experiencing some mild residual effects of the sinusitis and bronchitis, however overall there has been significant improvement.  She reports that a recent chest x-ray revealed an nodule on her left lung.  A chest CT has been ordered and she states that she will be scheduling for this study later today.  Assessment and plan: Severe persistent asthma  Continue omalizumab injections monthly, Symbicort 160/4.5 g 2 inhalations via spacer device twice a day, Spiriva Respimat 1.25 g, 2 inhalations daily, montelukast 10 mg daily, and albuterol every 4-6 hours as needed.  Subjective and objective measures of pulmonary function will be followed and the treatment plan will be adjusted accordingly.  Allergic rhinitis  Continue appropriate allergen avoidance measures, montelukast 10 mg daily, and fluticasone nasal spray as needed.  Nasal saline lavage (NeilMed) has been recommended as needed and prior to medicated nasal sprays along with instructions for proper administration.  For thick post nasal drainage, add guaifenesin [phone] mg (Mucinex)  twice daily as needed with adequate hydration as discussed.  GERD  (gastroesophageal reflux disease)  Continue reflux lifestyle modifications and omeprazole as prescribed.   Meds ordered this encounter  Medications  . budesonide-formoterol (SYMBICORT) 160-4.5 MCG/ACT inhaler    Sig: Inhale 2 puffs into the lungs 2 (two) times daily.    Dispense:  1 Inhaler    Refill:  5    Diagnostics: Spirometry:  Normal with an FEV1 of 103% predicted.  Please see scanned spirometry results for details.    Physical examination: Blood pressure 140/86, pulse 86, temperature 98.3 F (36.8 C), temperature source Oral, resp. rate 16, height 5\' 3"  (1.6 m), weight 225 lb (102.1 kg), SpO2 94 %.  General: Alert, interactive, in no acute distress. HEENT: TMs pearly gray, turbinates moderately edematous without discharge, post-pharynx mildly erythematous. Neck: Supple without lymphadenopathy. Lungs: Clear to auscultation without wheezing, rhonchi or rales. CV: Normal S1, S2 without murmurs. Skin: Warm and dry, without lesions or rashes.  The following portions of the patient's history were reviewed and updated as appropriate: allergies, current medications, past family history, past medical history, past social history, past surgical history and problem list.  Allergies as of 08/18/2017      Reactions   Avelox [moxifloxacin Hcl In Nacl] Other (See Comments)   Muscle Aches   Levofloxacin Other (See Comments)   Tendon pain      Medication List        Accurate as of 08/18/17  6:19 PM. Always use your most recent med list.          albuterol 108 (90 Base) MCG/ACT inhaler Commonly known as:  PROAIR HFA Inhale 2 puffs into the lungs every 4 (four) hours as needed for wheezing or shortness of breath.  albuterol (2.5 MG/3ML) 0.083% nebulizer solution Commonly known as:  PROVENTIL USE 1 VIAL IN NEBULIZER EVERY 4 HOURS AS NEEDED FOR WHEEZING OR  SHORTNESS  OF  BREATH   Azelastine HCl 0.15 % Soln Place 2 sprays into the nose daily.   budesonide-formoterol 160-4.5  MCG/ACT inhaler Commonly known as:  SYMBICORT Inhale 2 puffs into the lungs 2 (two) times daily.   calcium-vitamin D 500-200 MG-UNIT tablet Commonly known as:  OSCAL WITH D Take 1 tablet by mouth daily with breakfast.   diazepam 5 MG tablet Commonly known as:  VALIUM Take 5 mg by mouth every 6 (six) hours as needed for anxiety.   EPINEPHrine 0.3 mg/0.3 mL Soaj injection Commonly known as:  EPIPEN 2-PAK USE AS DIRECTED FOR SEVERE ALLERGIC REACTION   ferrous sulfate 325 (65 FE) MG tablet Take 1 tablet (325 mg total) by mouth 3 (three) times daily after meals.   fexofenadine 180 MG tablet Commonly known as:  ALLEGRA Take 180 mg by mouth daily.   fluticasone 50 MCG/ACT nasal spray Commonly known as:  FLONASE Place 1-2 sprays into both nostrils daily as needed for allergies or rhinitis.   furosemide 20 MG tablet Commonly known as:  LASIX Take 20 mg by mouth every morning.   glucosamine-chondroitin 500-400 MG tablet Take 1 tablet by mouth 2 (two) times daily.   lisinopril 40 MG tablet Commonly known as:  PRINIVIL,ZESTRIL Take 40 mg by mouth at bedtime.   montelukast 10 MG tablet Commonly known as:  SINGULAIR Take 1 tablet (10 mg total) by mouth at bedtime.   multivitamin with minerals Tabs tablet Take 1 tablet by mouth daily.   OMEGA 3 PO Take 1 capsule by mouth daily.   omeprazole 20 MG capsule Commonly known as:  PRILOSEC Take 1 capsule (20 mg total) by mouth daily.   PRESCRIPTION MEDICATION once a week.   tiotropium 18 MCG inhalation capsule Commonly known as:  SPIRIVA HANDIHALER INHALE 1 PUFF BY MOUTH EVERY MORNING TO PREVENT COUGH OR WHEEZE   Tiotropium Bromide Monohydrate 1.25 MCG/ACT Aers Commonly known as:  SPIRIVA RESPIMAT Inhale 2 puffs into the lungs daily.   vitamin C 500 MG tablet Commonly known as:  ASCORBIC ACID Take 500 mg by mouth daily.   XOLAIR 150 MG injection Generic drug:  omalizumab INJECT 300 MG SUBCUTANEOUSLY EVERY 4 WEEKS  -REFRIGERATE DO NOT FREEZE       Allergies  Allergen Reactions  . Avelox [Moxifloxacin Hcl In Nacl] Other (See Comments)    Muscle Aches  . Levofloxacin Other (See Comments)    Tendon pain    I appreciate the opportunity to take part in Atley's care. Please do not hesitate to contact me with questions.  Sincerely,   R. Edgar Frisk, MD

## 2017-08-18 NOTE — Patient Instructions (Signed)
Severe persistent asthma  Continue omalizumab injections monthly, Symbicort 160/4.5 g 2 inhalations via spacer device twice a day, Spiriva Respimat 1.25 g, 2 inhalations daily, montelukast 10 mg daily, and albuterol every 4-6 hours as needed.  Subjective and objective measures of pulmonary function will be followed and the treatment plan will be adjusted accordingly.  Allergic rhinitis  Continue appropriate allergen avoidance measures, montelukast 10 mg daily, and fluticasone nasal spray as needed.  Nasal saline lavage (NeilMed) has been recommended as needed and prior to medicated nasal sprays along with instructions for proper administration.  For thick post nasal drainage, add guaifenesin [phone] mg (Mucinex)  twice daily as needed with adequate hydration as discussed.  GERD (gastroesophageal reflux disease)  Continue reflux lifestyle modifications and omeprazole as prescribed.   Return in about 4 months (around 12/19/2017), or if symptoms worsen or fail to improve.

## 2017-08-18 NOTE — Assessment & Plan Note (Signed)
   Continue reflux lifestyle modifications and omeprazole as prescribed. 

## 2017-08-20 ENCOUNTER — Ambulatory Visit
Admission: RE | Admit: 2017-08-20 | Discharge: 2017-08-20 | Disposition: A | Discharge status: Home / Self Care | Payer: BLUE CROSS/BLUE SHIELD | Source: Ambulatory Visit | Attending: Family Medicine | Admitting: Family Medicine

## 2017-08-20 DIAGNOSIS — R911 Solitary pulmonary nodule: Secondary | ICD-10-CM | POA: Diagnosis not present

## 2017-08-20 DIAGNOSIS — R9389 Abnormal findings on diagnostic imaging of other specified body structures: Secondary | ICD-10-CM

## 2017-08-20 MED ORDER — IOPAMIDOL (ISOVUE-300) INJECTION 61%
75.0000 mL | Freq: Once | INTRAVENOUS | Status: AC | PRN
  Administered 2017-08-20: 75 mL via INTRAVENOUS

## 2017-08-24 ENCOUNTER — Ambulatory Visit (INDEPENDENT_AMBULATORY_CARE_PROVIDER_SITE_OTHER): Payer: BLUE CROSS/BLUE SHIELD | Admitting: *Deleted

## 2017-08-24 DIAGNOSIS — J309 Allergic rhinitis, unspecified: Secondary | ICD-10-CM

## 2017-08-28 DIAGNOSIS — R911 Solitary pulmonary nodule: Secondary | ICD-10-CM | POA: Diagnosis not present

## 2017-08-28 DIAGNOSIS — R600 Localized edema: Secondary | ICD-10-CM | POA: Diagnosis not present

## 2017-08-28 DIAGNOSIS — I10 Essential (primary) hypertension: Secondary | ICD-10-CM | POA: Diagnosis not present

## 2017-08-28 DIAGNOSIS — J4521 Mild intermittent asthma with (acute) exacerbation: Secondary | ICD-10-CM | POA: Diagnosis not present

## 2017-08-31 ENCOUNTER — Ambulatory Visit (INDEPENDENT_AMBULATORY_CARE_PROVIDER_SITE_OTHER): Payer: BLUE CROSS/BLUE SHIELD | Admitting: *Deleted

## 2017-08-31 DIAGNOSIS — J309 Allergic rhinitis, unspecified: Secondary | ICD-10-CM

## 2017-09-08 ENCOUNTER — Ambulatory Visit (INDEPENDENT_AMBULATORY_CARE_PROVIDER_SITE_OTHER): Payer: BLUE CROSS/BLUE SHIELD | Admitting: *Deleted

## 2017-09-08 ENCOUNTER — Encounter: Payer: Self-pay | Admitting: Internal Medicine

## 2017-09-08 ENCOUNTER — Ambulatory Visit: Payer: BLUE CROSS/BLUE SHIELD | Admitting: Internal Medicine

## 2017-09-08 VITALS — BP 152/88 | HR 91 | Ht 63.0 in | Wt 220.0 lb

## 2017-09-08 DIAGNOSIS — Z8709 Personal history of other diseases of the respiratory system: Secondary | ICD-10-CM

## 2017-09-08 DIAGNOSIS — J309 Allergic rhinitis, unspecified: Secondary | ICD-10-CM

## 2017-09-08 DIAGNOSIS — R911 Solitary pulmonary nodule: Secondary | ICD-10-CM | POA: Diagnosis not present

## 2017-09-08 DIAGNOSIS — I2510 Atherosclerotic heart disease of native coronary artery without angina pectoris: Secondary | ICD-10-CM

## 2017-09-08 DIAGNOSIS — Q859 Phakomatosis, unspecified: Secondary | ICD-10-CM

## 2017-09-08 NOTE — Progress Notes (Signed)
Subjective:     Patient ID: Melanie Armstrong, female   DOB: 06-Aug-1955, 62 y.o.   MRN: [id number]   PCP [name], MD  HPI  IOV 09/08/2017  Chief Complaint  Patient presents with  . Pulm Consult    Referred by Dr. Veverly Fells for left pulmonary nodules. Per patient, has 2 nodules on the left. Patient has a history of asthma and SOB.      Melanie Armstrong is a 62 year old non-smoker who works on a desk job doing Microbiologist.  She is here referred for left lung nodules.  The background history is that she has been on lisinopril for 15 years.  She carries a diagnosis of asthma allergic that is managed by our local allergist.  She tells me that she is been on Symbicort for at least 10 years and Spiriva for at least 5 years and the last couple of years Xolair.  She describes herself as severe persistent asthma.  Her recent spirometry May 2019 personal visualization shows no normal FEV1 and FVC.  Earlier in spring 2019 she had respiratory infection following exposure to sick contacts at work.  Since then she has had significant cough and bronchitis requiring at least 2 rounds of prednisone and antibiotics.  Things are improving and currently the cough is rated as a 3 out of 10.  She tells me as part of this persistent bronchitic cough symptoms she did have a chest x-ray that showed left lung nodule that then resulted in the CT scan of the chest Aug 20, 2017 that shows a 1.7 cm left lung nodule that is dense and I personally visualized and agree with the radiologist interpretation that this is a hamartoma.  There is another 9 mm subpleural left lower lobe posterior segment nodule and that the main reason for the referral today.  The CT scan also shows coronary artery calcification but she denies any chest pain.  She is a non-smoker.  Although when she was a kid her parents smoked.   CT chest 08/20/17 IMPRESSION: 1.7 cm benign pulmonary hamartoma, which corresponds to the left lung nodule seen on  recent chest radiograph.  Indeterminate 9 mm pulmonary nodule in posterior left lower lobe. Consider one of the following in 3 months for both low-risk and high-risk individuals: (a) repeat chest CT, (b) follow-up PET-CT, or (c) tissue sampling. This recommendation follows the consensus statement: Guidelines for Management of Incidental Pulmonary Nodules Detected on CT Images: From the Fleischner Society 2017; Radiology 2017; 284:228-243.  Incidental findings: Aortic Atherosclerosis (ICD10-I70.0). Coronary artery calcification. Tiny hiatal hernia. Small benign right adrenal adenoma.   Electronically Signed   By: Earle Gell M.D.   On: 08/21/2017 10:50    has a past medical history of Arthritis, Asthma, GERD (gastroesophageal reflux disease), Heart murmur, and Hypertension.   reports that she has never smoked. She has never used smokeless tobacco.  Past Surgical History:  Procedure Laterality Date  . CHOLECYSTECTOMY    . TONSILLECTOMY    . TOTAL HIP ARTHROPLASTY Right 08/22/2014   Procedure: RIGHT TOTAL HIP ARTHROPLASTY ANTERIOR APPROACH;  Surgeon: Paralee Cancel, MD;  Location: [facility];  Service: Orthopedics;  Laterality: Right;    Allergies  Allergen Reactions  . Advair Diskus [Fluticasone-Salmeterol]     Per patient, she thinks she developed walking PNA.   Marland Kitchen Avelox [Moxifloxacin Hcl In Nacl] Other (See Comments)    Muscle Aches  . Levofloxacin Other (See Comments)    Tendon pain  . Protonix [Pantoprazole  Sodium] Diarrhea     There is no immunization history on file for this patient.  Family History  Problem Relation Age of Onset  . Allergic rhinitis Neg Hx   . Angioedema Neg Hx   . Asthma Neg Hx   . Eczema Neg Hx   . Immunodeficiency Neg Hx   . Urticaria Neg Hx      Current Outpatient Medications:  .  albuterol (PROAIR HFA) 108 (90 Base) MCG/ACT inhaler, Inhale 2 puffs into the lungs every 4 (four) hours as needed for wheezing or shortness of breath.,  Disp: 1 Inhaler, Rfl: 1 .  albuterol (PROVENTIL) (2.5 MG/3ML) 0.083% nebulizer solution, USE 1 VIAL IN NEBULIZER EVERY 4 HOURS AS NEEDED FOR WHEEZING OR  SHORTNESS  OF  BREATH, Disp: 600 mL, Rfl: 0 .  Azelastine HCl 0.15 % SOLN, Place 2 sprays into the nose daily., Disp: , Rfl:  .  bimatoprost (LUMIGAN) 0.03 % ophthalmic solution, Place 1 drop into both eyes at bedtime., Disp: , Rfl:  .  budesonide-formoterol (SYMBICORT) 160-4.5 MCG/ACT inhaler, Inhale 2 puffs into the lungs 2 (two) times daily., Disp: 1 Inhaler, Rfl: 5 .  calcium-vitamin D (OSCAL WITH D) 500-200 MG-UNIT per tablet, Take 1 tablet by mouth daily with breakfast., Disp: , Rfl:  .  diazepam (VALIUM) 5 MG tablet, Take 5 mg by mouth every 6 (six) hours as needed for anxiety., Disp: , Rfl:  .  EPINEPHrine (EPIPEN 2-PAK) 0.3 mg/0.3 mL IJ SOAJ injection, USE AS DIRECTED FOR SEVERE ALLERGIC REACTION, Disp: 2 Device, Rfl: 1 .  ferrous sulfate 325 (65 FE) MG tablet, Take 1 tablet (325 mg total) by mouth 3 (three) times daily after meals. (Patient taking differently: Take 325 mg by mouth daily with breakfast. ), Disp: , Rfl: 3 .  fexofenadine (ALLEGRA) 180 MG tablet, Take 180 mg by mouth daily., Disp: , Rfl:  .  fluticasone (FLONASE) 50 MCG/ACT nasal spray, Place 1-2 sprays into both nostrils daily as needed for allergies or rhinitis., Disp: 16 g, Rfl: 5 .  furosemide (LASIX) 20 MG tablet, Take 20 mg by mouth every morning., Disp: , Rfl:  .  glucosamine-chondroitin 500-400 MG tablet, Take 1 tablet by mouth 2 (two) times daily., Disp: , Rfl:  .  lisinopril (PRINIVIL,ZESTRIL) 40 MG tablet, Take 40 mg by mouth at bedtime., Disp: , Rfl:  .  montelukast (SINGULAIR) 10 MG tablet, Take 1 tablet (10 mg total) by mouth at bedtime., Disp: 30 tablet, Rfl: 5 .  Multiple Vitamin (MULTIVITAMIN WITH MINERALS) TABS tablet, Take 1 tablet by mouth daily., Disp: , Rfl:  .  Omega-3 Fatty Acids (OMEGA 3 PO), Take 1 capsule by mouth daily., Disp: , Rfl:  .   omeprazole (PRILOSEC) 20 MG capsule, Take 1 capsule (20 mg total) by mouth daily., Disp: 30 capsule, Rfl: 5 .  PRESCRIPTION MEDICATION, once a week., Disp: , Rfl:  .  tiotropium (SPIRIVA HANDIHALER) 18 MCG inhalation capsule, INHALE 1 PUFF BY MOUTH EVERY MORNING TO PREVENT COUGH OR WHEEZE, Disp: 30 capsule, Rfl: 5 .  Tiotropium Bromide Monohydrate (SPIRIVA RESPIMAT) 1.25 MCG/ACT AERS, Inhale 2 puffs into the lungs daily., Disp: 4 g, Rfl: 5 .  vitamin C (ASCORBIC ACID) 500 MG tablet, Take 500 mg by mouth daily., Disp: , Rfl:  .  XOLAIR 150 MG injection, INJECT 300 MG SUBCUTANEOUSLY EVERY 4 WEEKS -REFRIGERATE DO NOT FREEZE, Disp: 6 each, Rfl: 3  Current Facility-Administered Medications:  .  omalizumab (XOLAIR) injection 300 mg, 300 mg, Subcutaneous,  Q28 days, Kozlow, Donnamarie Poag, MD, 300 mg at 08/18/17 1118   Review of Systems     Objective:   Physical Exam  Constitutional: She is oriented to person, place, and time. She appears well-developed and well-nourished. No distress.  HENT:  Head: Normocephalic and atraumatic.  Right Ear: External ear normal.  Left Ear: External ear normal.  Mouth/Throat: Oropharynx is clear and moist. No oropharyngeal exudate.  Eyes: Pupils are equal, round, and reactive to light. Conjunctivae and EOM are normal. Right eye exhibits no discharge. Left eye exhibits no discharge. No scleral icterus.  Neck: Normal range of motion. Neck supple. No JVD present. No tracheal deviation present. No thyromegaly present.  Cardiovascular: Normal rate, regular rhythm, normal heart sounds and intact distal pulses. Exam reveals no gallop and no friction rub.  No murmur heard. Pulmonary/Chest: Effort normal and breath sounds normal. No respiratory distress. She has no wheezes. She has no rales. She exhibits no tenderness.  Abdominal: Soft. Bowel sounds are normal. She exhibits no distension and no mass. There is no tenderness. There is no rebound and no guarding.  Musculoskeletal:  Normal range of motion. She exhibits no edema or tenderness.  Lymphadenopathy:    She has no cervical adenopathy.  Neurological: She is alert and oriented to person, place, and time. She has normal reflexes. No cranial nerve deficit. She exhibits normal muscle tone. Coordination normal.  Skin: Skin is warm and dry. No rash noted. She is not diaphoretic. No erythema. No pallor.  Psychiatric: She has a normal mood and affect. Her behavior is normal. Judgment and thought content normal.  Vitals reviewed.  Vitals:   09/08/17 1049  BP: (!) 152/88  Pulse: 91  SpO2: 95%  Weight: 220 lb (99.8 kg)  Height: 5\' 3"  (1.6 m)    Estimated body mass index is 38.97 kg/m as calculated from the following:   Height as of this encounter: 5\' 3"  (1.6 m).   Weight as of this encounter: 220 lb (99.8 kg).     Assessment:       ICD-10-CM   1. Nodule of lower lobe of left lung R91.1   2. Pulmonary hamartoma ([place]) Q85.9   3. History of asthma Z87.09   4. Coronary artery calcification seen on CAT scan I25.10        Plan:        Nodule of lower lobe of left lung 8mm in CT may 10,2019 - - low intermediate prob for malignancy  - do repeat CT chest without contrast mid august 2019  Pulmonary hamartoma [facility])  - this is seen in left lung  = no active folllowup but will get info as we do CT chest for aboe  History of asthma - talk to your doctors and ask for lisinopril to be substituted by another class  - this change could help cough relief  Coronary artery calcification  -  You do hav coronary artery calcification and if no normal cardiac stress test past few years; we will  refer to cardiologist - [facility] or Dr Einar Gip, first available   Dr. Brand Males, M.D., [facility].C.P Pulmonary and Critical Care Medicine Staff Physician, [place] Director - Interstitial Lung Disease  Program  Pulmonary [place] [facility] [place], [place],  [address]  Pager: [phone], If no answer or between  15:00h - 7:00h: call [phone] Telephone: [phone]

## 2017-09-08 NOTE — Patient Instructions (Addendum)
ICD-10-CM   1. Nodule of lower lobe of left lung R91.1   2. Pulmonary hamartoma ([place]) Q85.9   3. History of asthma Z87.09   4. Coronary artery calcification seen on CAT scan I25.10      Nodule of lower lobe of left lung 69mm in CT may 10,2019 - low intermediate prob for malignancy  - do repeat CT chest without contrast mid august 2019  Pulmonary hamartoma [facility])  - this is seen in left lung  = no active folllowup but will get info as we do CT chest for aboe  History of asthma - talk to your doctors and ask for lisinopril to be substituted by another class  - this change could help cough relief  Coronary artery calcification -  You dop  coronary artery calcification and if no normal cardiac stress test past few years; we will  refer to cardiologist - [facility] or Dr Einar Gip, first available  Followup 3 months but after CT chest in Auugust 2019

## 2017-09-08 NOTE — Progress Notes (Signed)
   Subjective:    Patient ID: Melanie Armstrong, female    DOB: [birth year], 62 y.o.   MRN: [id number]  HPI    Review of Systems  Constitutional: Negative for fever and unexpected weight change.  HENT: Negative for congestion, dental problem, ear pain, nosebleeds, postnasal drip, rhinorrhea, sinus pressure, sneezing, sore throat and trouble swallowing.   Eyes: Negative for redness and itching.  Respiratory: Positive for cough and shortness of breath. Negative for chest tightness and wheezing.   Cardiovascular: Negative for palpitations and leg swelling.  Gastrointestinal: Negative for nausea and vomiting.  Genitourinary: Negative for dysuria.  Musculoskeletal: Negative for joint swelling.  Skin: Negative for rash.  Allergic/Immunologic: Negative.  Negative for environmental allergies, food allergies and immunocompromised state.  Neurological: Negative for headaches.  Hematological: Does not bruise/bleed easily.  Psychiatric/Behavioral: Negative for dysphoric mood. The patient is not nervous/anxious.        Objective:   Physical Exam        Assessment & Plan:

## 2017-09-09 DIAGNOSIS — J4550 Severe persistent asthma, uncomplicated: Secondary | ICD-10-CM | POA: Diagnosis not present

## 2017-09-14 ENCOUNTER — Ambulatory Visit (INDEPENDENT_AMBULATORY_CARE_PROVIDER_SITE_OTHER): Payer: BLUE CROSS/BLUE SHIELD

## 2017-09-14 DIAGNOSIS — J309 Allergic rhinitis, unspecified: Secondary | ICD-10-CM

## 2017-09-15 ENCOUNTER — Ambulatory Visit (INDEPENDENT_AMBULATORY_CARE_PROVIDER_SITE_OTHER): Payer: BLUE CROSS/BLUE SHIELD | Admitting: *Deleted

## 2017-09-15 DIAGNOSIS — J4540 Moderate persistent asthma, uncomplicated: Secondary | ICD-10-CM | POA: Diagnosis not present

## 2017-09-21 ENCOUNTER — Ambulatory Visit (INDEPENDENT_AMBULATORY_CARE_PROVIDER_SITE_OTHER): Payer: BLUE CROSS/BLUE SHIELD | Admitting: *Deleted

## 2017-09-21 DIAGNOSIS — J309 Allergic rhinitis, unspecified: Secondary | ICD-10-CM | POA: Diagnosis not present

## 2017-09-28 ENCOUNTER — Ambulatory Visit (INDEPENDENT_AMBULATORY_CARE_PROVIDER_SITE_OTHER): Payer: BLUE CROSS/BLUE SHIELD

## 2017-09-28 DIAGNOSIS — J309 Allergic rhinitis, unspecified: Secondary | ICD-10-CM | POA: Diagnosis not present

## 2017-10-02 DIAGNOSIS — H40053 Ocular hypertension, bilateral: Secondary | ICD-10-CM | POA: Diagnosis not present

## 2017-10-05 ENCOUNTER — Ambulatory Visit (INDEPENDENT_AMBULATORY_CARE_PROVIDER_SITE_OTHER): Payer: BLUE CROSS/BLUE SHIELD | Admitting: *Deleted

## 2017-10-05 DIAGNOSIS — J309 Allergic rhinitis, unspecified: Secondary | ICD-10-CM | POA: Diagnosis not present

## 2017-10-06 ENCOUNTER — Other Ambulatory Visit: Payer: Self-pay | Admitting: Allergy and Immunology

## 2017-10-06 DIAGNOSIS — J4550 Severe persistent asthma, uncomplicated: Secondary | ICD-10-CM

## 2017-10-12 ENCOUNTER — Ambulatory Visit (INDEPENDENT_AMBULATORY_CARE_PROVIDER_SITE_OTHER): Payer: BLUE CROSS/BLUE SHIELD | Admitting: *Deleted

## 2017-10-12 DIAGNOSIS — J4550 Severe persistent asthma, uncomplicated: Secondary | ICD-10-CM

## 2017-10-12 DIAGNOSIS — J309 Allergic rhinitis, unspecified: Secondary | ICD-10-CM | POA: Diagnosis not present

## 2017-10-13 ENCOUNTER — Ambulatory Visit (INDEPENDENT_AMBULATORY_CARE_PROVIDER_SITE_OTHER): Payer: BLUE CROSS/BLUE SHIELD | Admitting: *Deleted

## 2017-10-13 DIAGNOSIS — J4550 Severe persistent asthma, uncomplicated: Secondary | ICD-10-CM | POA: Diagnosis not present

## 2017-10-14 ENCOUNTER — Encounter: Payer: Self-pay | Admitting: *Deleted

## 2017-10-14 DIAGNOSIS — J3089 Other allergic rhinitis: Secondary | ICD-10-CM | POA: Diagnosis not present

## 2017-10-14 NOTE — Progress Notes (Signed)
MAINTENANCE VIAL MADE. EXP: 10-15-18. HV

## 2017-10-19 ENCOUNTER — Ambulatory Visit (INDEPENDENT_AMBULATORY_CARE_PROVIDER_SITE_OTHER): Payer: BLUE CROSS/BLUE SHIELD

## 2017-10-19 DIAGNOSIS — J309 Allergic rhinitis, unspecified: Secondary | ICD-10-CM | POA: Diagnosis not present

## 2017-10-26 ENCOUNTER — Ambulatory Visit (INDEPENDENT_AMBULATORY_CARE_PROVIDER_SITE_OTHER): Payer: BLUE CROSS/BLUE SHIELD | Admitting: *Deleted

## 2017-10-26 DIAGNOSIS — J309 Allergic rhinitis, unspecified: Secondary | ICD-10-CM | POA: Diagnosis not present

## 2017-10-27 DIAGNOSIS — J0101 Acute recurrent maxillary sinusitis: Secondary | ICD-10-CM | POA: Diagnosis not present

## 2017-11-02 ENCOUNTER — Ambulatory Visit (INDEPENDENT_AMBULATORY_CARE_PROVIDER_SITE_OTHER): Payer: BLUE CROSS/BLUE SHIELD

## 2017-11-02 DIAGNOSIS — J309 Allergic rhinitis, unspecified: Secondary | ICD-10-CM | POA: Diagnosis not present

## 2017-11-09 ENCOUNTER — Ambulatory Visit (INDEPENDENT_AMBULATORY_CARE_PROVIDER_SITE_OTHER): Payer: BLUE CROSS/BLUE SHIELD

## 2017-11-09 DIAGNOSIS — J309 Allergic rhinitis, unspecified: Secondary | ICD-10-CM | POA: Diagnosis not present

## 2017-11-09 DIAGNOSIS — J4540 Moderate persistent asthma, uncomplicated: Secondary | ICD-10-CM | POA: Diagnosis not present

## 2017-11-10 ENCOUNTER — Ambulatory Visit (INDEPENDENT_AMBULATORY_CARE_PROVIDER_SITE_OTHER): Payer: BLUE CROSS/BLUE SHIELD | Admitting: *Deleted

## 2017-11-10 DIAGNOSIS — J4540 Moderate persistent asthma, uncomplicated: Secondary | ICD-10-CM | POA: Diagnosis not present

## 2017-11-16 ENCOUNTER — Ambulatory Visit (INDEPENDENT_AMBULATORY_CARE_PROVIDER_SITE_OTHER): Payer: BLUE CROSS/BLUE SHIELD | Admitting: *Deleted

## 2017-11-16 DIAGNOSIS — J309 Allergic rhinitis, unspecified: Secondary | ICD-10-CM | POA: Diagnosis not present

## 2017-11-20 ENCOUNTER — Ambulatory Visit: Payer: BLUE CROSS/BLUE SHIELD | Admitting: Interventional Cardiology

## 2017-11-20 ENCOUNTER — Encounter (INDEPENDENT_AMBULATORY_CARE_PROVIDER_SITE_OTHER): Payer: Self-pay

## 2017-11-20 ENCOUNTER — Encounter: Payer: Self-pay | Admitting: Interventional Cardiology

## 2017-11-20 VITALS — BP 140/82 | HR 84 | Ht 63.0 in | Wt 228.1 lb

## 2017-11-20 DIAGNOSIS — I2510 Atherosclerotic heart disease of native coronary artery without angina pectoris: Secondary | ICD-10-CM

## 2017-11-20 DIAGNOSIS — I10 Essential (primary) hypertension: Secondary | ICD-10-CM

## 2017-11-20 NOTE — Progress Notes (Signed)
Cardiology Office Note   Date:  11/20/2017   ID:  Melanie Armstrong, DOB Jan 06, 1956, MRN [id number]  PCP:  [name], MD    No chief complaint on file. Coronary artery calcium   Wt Readings from Last 3 Encounters:  11/20/17 228 lb 1.9 oz (103.5 kg)  09/08/17 220 lb (99.8 kg)  08/18/17 225 lb (102.1 kg)       History of Present Illness:   Melanie Armstrong is a 62 y.o. female who is being seen today for the evaluation of coronary calcium at the request of [name], MD.   She has a recent diagnosis of lung nodules by CT scan.    She has a h/o asthma.  She has chronic allergies.  Taking allergy shots.   Father with CAD at advanced age, 15. No siblings- only child.  Her mother had TIA and died at age 52.    Denies : Chest pain. Dizziness. Leg edema. Nitroglycerin use. Orthopnea. Palpitations. Paroxysmal nocturnal dyspnea. Syncope.   No prior cardiac testing.  Cholesterol has been managed with diet.    She does hip ROM exercises including walking in place.  No sx with that activity.    Past Medical History:  Diagnosis Date  . Arthritis   . Asthma   . GERD (gastroesophageal reflux disease)   . Heart murmur    as a child   . Hypertension     Past Surgical History:  Procedure Laterality Date  . CHOLECYSTECTOMY    . TONSILLECTOMY    . TOTAL HIP ARTHROPLASTY Right 08/22/2014   Procedure: RIGHT TOTAL HIP ARTHROPLASTY ANTERIOR APPROACH;  Surgeon: Paralee Cancel, MD;  Location: [facility];  Service: Orthopedics;  Laterality: Right;     Current Outpatient Medications  Medication Sig Dispense Refill  . albuterol (PROAIR HFA) 108 (90 Base) MCG/ACT inhaler Inhale 2 puffs into the lungs every 4 (four) hours as needed for wheezing or shortness of breath. 1 Inhaler 1  . albuterol (PROVENTIL) (2.5 MG/3ML) 0.083% nebulizer solution USE 1 VIAL IN NEBULIZER EVERY 4 HOURS AS NEEDED FOR WHEEZING OR  SHORTNESS  OF  BREATH 600 mL 0  . Azelastine HCl 0.15 % SOLN Place 2 sprays into  the nose daily.    . bimatoprost (LUMIGAN) 0.03 % ophthalmic solution Place 1 drop into both eyes at bedtime.    . budesonide-formoterol (SYMBICORT) 160-4.5 MCG/ACT inhaler Inhale 2 puffs into the lungs 2 (two) times daily. 1 Inhaler 5  . calcium-vitamin D (OSCAL WITH D) 500-200 MG-UNIT per tablet Take 1 tablet by mouth daily with breakfast.    . diazepam (VALIUM) 5 MG tablet Take 5 mg by mouth every 6 (six) hours as needed for anxiety.    Marland Kitchen EPINEPHrine (EPIPEN 2-PAK) 0.3 mg/0.3 mL IJ SOAJ injection USE AS DIRECTED FOR SEVERE ALLERGIC REACTION 2 Device 1  . ferrous sulfate 325 (65 FE) MG tablet Take 1 tablet (325 mg total) by mouth 3 (three) times daily after meals. (Patient taking differently: Take 325 mg by mouth daily with breakfast. )  3  . fexofenadine (ALLEGRA) 180 MG tablet Take 180 mg by mouth daily.    . fluticasone (FLONASE) 50 MCG/ACT nasal spray Place 1-2 sprays into both nostrils daily as needed for allergies or rhinitis. 16 g 5  . furosemide (LASIX) 20 MG tablet Take 20 mg by mouth every morning.    Marland Kitchen glucosamine-chondroitin 500-400 MG tablet Take 1 tablet by mouth 2 (two) times daily.    Marland Kitchen  lisinopril (PRINIVIL,ZESTRIL) 40 MG tablet Take 40 mg by mouth at bedtime.    . montelukast (SINGULAIR) 10 MG tablet Take 1 tablet (10 mg total) by mouth at bedtime. 30 tablet 5  . Multiple Vitamin (MULTIVITAMIN WITH MINERALS) TABS tablet Take 1 tablet by mouth daily.    . Omega-3 Fatty Acids (OMEGA 3 PO) Take 1 capsule by mouth daily.    Marland Kitchen omeprazole (PRILOSEC) 20 MG capsule Take 1 capsule (20 mg total) by mouth daily. 30 capsule 5  . PRESCRIPTION MEDICATION once a week.    . SPIRIVA RESPIMAT 1.25 MCG/ACT AERS INHALE 2 PUFFS INTO THE LUNGS DAILY. 4 g 3  . tiotropium (SPIRIVA HANDIHALER) 18 MCG inhalation capsule INHALE 1 PUFF BY MOUTH EVERY MORNING TO PREVENT COUGH OR WHEEZE 30 capsule 5  . vitamin C (ASCORBIC ACID) 500 MG tablet Take 500 mg by mouth daily.    Arvid Right 150 MG injection INJECT  300 MG SUBCUTANEOUSLY EVERY 4 WEEKS -REFRIGERATE DO NOT FREEZE 6 each 3   Current Facility-Administered Medications  Medication Dose Route Frequency Provider Last Rate Last Dose  . omalizumab Arvid Right) injection 300 mg  300 mg Subcutaneous Q28 days Jiles Prows, MD   300 mg at 11/10/17 1141    Allergies:   Advair diskus [fluticasone-salmeterol]; Avelox [moxifloxacin hcl in nacl]; Levofloxacin; and Protonix [pantoprazole sodium]    Social History:  The patient  reports that she has never smoked. She has never used smokeless tobacco. She reports that she does not drink alcohol or use drugs.   Family History:  The patient's family history includes Heart disease in her father.    ROS:  Please see the history of present illness.   Otherwise, review of systems are positive for chronic shortness of breath.   All other systems are reviewed and negative.    PHYSICAL EXAM: VS:  BP 140/82   Pulse 84   Ht 5\' 3"  (1.6 m)   Wt 228 lb 1.9 oz (103.5 kg)   SpO2 97%   BMI 40.41 kg/m  , BMI Body mass index is 40.41 kg/m. GEN: Well nourished, well developed, in no acute distress  HEENT: normal  Neck: no JVD, carotid bruits, or masses Cardiac: RRR; no murmurs, rubs, or gallops,no edema  Respiratory:  clear to auscultation bilaterally, normal work of breathing GI: soft, nontender, nondistended, + BS MS: no deformity or atrophy  Skin: warm and dry, no rash Neuro:  Strength and sensation are intact Psych: euthymic mood, full affect   EKG:   The ekg ordered today demonstrates NSR, poor R wave progression   Recent Labs: No results found for requested labs within last 8760 hours.   Lipid Panel No results found for: CHOL, TRIG, HDL, CHOLHDL, VLDL, LDLCALC, LDLDIRECT   Other studies Reviewed: Additional studies/ records that were reviewed today with results demonstrating: Chest CT reviewed.   ASSESSMENT AND PLAN:  1. Coronary artery calcium by CT scan: No symptoms of angina.  We discussed  further evaluation with stress testing.  Since she feels fine, she does not want to pursue any further testing.  She will let us know if she has any problems going forward.  Her dyspnea is chronic and she feels is related to asthma 2. Lipids reviewed.  High HDL.  LDL slightly above 100.  Continue healthy diet and regular exercise to try to keep cholesterol low. 3. Hypertension: Continue medications for blood pressure control.   Current medicines are reviewed at length with the patient today.  The patient concerns regarding her medicines were addressed.  The following changes have been made:  No change  Labs/ tests ordered today include:  No orders of the defined types were placed in this encounter.   Recommend 150 minutes/week of aerobic exercise Low fat, low carb, high fiber diet recommended  Disposition:   FU as needed   Signed, Larae Grooms, MD  11/20/2017 4:25 PM    [facility] [place], [place], [place]  [address] Phone: [phone]; Fax: [phone]

## 2017-11-20 NOTE — Patient Instructions (Signed)
Medication Instructions:  Your physician recommends that you continue on your current medications as directed. Please refer to the Current Medication list given to you today.   Labwork: None ordered  Testing/Procedures: None ordered  Follow-Up: Your physician wants you to follow-up AS NEEDED with Dr. Irish Lack  Any Other Special Instructions Will Be Listed Below (If Applicable).     If you need a refill on your cardiac medications before your next appointment, please call your pharmacy.

## 2017-11-23 ENCOUNTER — Ambulatory Visit (INDEPENDENT_AMBULATORY_CARE_PROVIDER_SITE_OTHER): Payer: BLUE CROSS/BLUE SHIELD

## 2017-11-23 DIAGNOSIS — J309 Allergic rhinitis, unspecified: Secondary | ICD-10-CM

## 2017-11-26 ENCOUNTER — Inpatient Hospital Stay: Admission: RE | Admit: 2017-11-26 | Payer: BLUE CROSS/BLUE SHIELD | Source: Ambulatory Visit

## 2017-11-27 ENCOUNTER — Inpatient Hospital Stay: Admission: RE | Admit: 2017-11-27 | Payer: BLUE CROSS/BLUE SHIELD | Source: Ambulatory Visit

## 2017-11-27 ENCOUNTER — Ambulatory Visit (INDEPENDENT_AMBULATORY_CARE_PROVIDER_SITE_OTHER)
Admission: RE | Admit: 2017-11-27 | Discharge: 2017-11-27 | Disposition: A | Discharge status: Home / Self Care | Payer: BLUE CROSS/BLUE SHIELD | Source: Ambulatory Visit | Attending: Internal Medicine | Admitting: Internal Medicine

## 2017-11-27 DIAGNOSIS — R911 Solitary pulmonary nodule: Secondary | ICD-10-CM | POA: Diagnosis not present

## 2017-11-28 DIAGNOSIS — M25552 Pain in left hip: Secondary | ICD-10-CM | POA: Diagnosis not present

## 2017-11-30 ENCOUNTER — Encounter: Payer: Self-pay | Admitting: Internal Medicine

## 2017-11-30 ENCOUNTER — Ambulatory Visit: Payer: BLUE CROSS/BLUE SHIELD | Admitting: Internal Medicine

## 2017-11-30 VITALS — BP 136/78 | HR 88 | Ht 63.0 in | Wt 232.2 lb

## 2017-11-30 DIAGNOSIS — Q859 Phakomatosis, unspecified: Secondary | ICD-10-CM

## 2017-11-30 DIAGNOSIS — R911 Solitary pulmonary nodule: Secondary | ICD-10-CM | POA: Diagnosis not present

## 2017-11-30 DIAGNOSIS — Z8709 Personal history of other diseases of the respiratory system: Secondary | ICD-10-CM

## 2017-11-30 DIAGNOSIS — R918 Other nonspecific abnormal finding of lung field: Secondary | ICD-10-CM | POA: Diagnosis not present

## 2017-11-30 NOTE — Progress Notes (Signed)
Subjective:     Patient ID: Melanie Armstrong, female   DOB: 02-10-1956, 62 y.o.   MRN: [id number]  HPI  PCP [name], MD  HPI  IOV 09/08/2017  Chief Complaint  Patient presents with  . Pulm Consult    Referred by Dr. Veverly Fells for left pulmonary nodules. Per patient, has 2 nodules on the left. Patient has a history of asthma and SOB.      Melanie Armstrong is a 62 year old non-smoker who works on a desk job doing Microbiologist.  She is here referred for left lung nodules.  The background history is that she has been on lisinopril for 15 years.  She carries a diagnosis of asthma allergic that is managed by our local allergist.  She tells me that she is been on Symbicort for at least 10 years and Spiriva for at least 5 years and the last couple of years Xolair.  She describes herself as severe persistent asthma.  Her recent spirometry May 2019 personal visualization shows no normal FEV1 and FVC.  Earlier in spring 2019 she had respiratory infection following exposure to sick contacts at work.  Since then she has had significant cough and bronchitis requiring at least 2 rounds of prednisone and antibiotics.  Things are improving and currently the cough is rated as a 3 out of 10.  She tells me as part of this persistent bronchitic cough symptoms she did have a chest x-ray that showed left lung nodule that then resulted in the CT scan of the chest Aug 20, 2017 that shows a 1.7 cm left lung nodule that is dense and I personally visualized and agree with the radiologist interpretation that this is a hamartoma.  There is another 9 mm subpleural left lower lobe posterior segment nodule and that the main reason for the referral today.  The CT scan also shows coronary artery calcification but she denies any chest pain.  She is a non-smoker.  Although when she was a kid her parents smoked.   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  Indeterminate 9 mm pulmonary nodule in posterior left lower lobe. Consider one of the following in 3 months for both low-risk and high-risk individuals: (a) repeat chest CT, (b) follow-up PET-CT, or (c) tissue sampling. This recommendation follows the consensus statement: Guidelines for Management of Incidental Pulmonary Nodules Detected on CT Images: From the Fleischner Society 2017; Radiology 2017; 284:228-243.  Incidental findings: Aortic Atherosclerosis (ICD10-I70.0). Coronary artery calcification. Tiny hiatal hernia. Small benign right adrenal adenoma.   Electronically Signed   By: Earle Gell M.D.   On: 08/21/2017 10:50  OV 11/30/2017  Chief Complaint  Patient presents with  . Follow-up    CT 8/16.  Pt also had cards referral 8/9. Pt states she has been doing well since last visit and denies any complaints.     Follow-up lung nodules 9 mm left lower lobe in the setting of 1.7 cm benign left lower lobe hamartoma and history of asthma followed by allergist.  Is a routine follow-up. She had a 3 month CT scan of the chest that is documented below. The 9 mm left lower lobe nodule is stable but in May 2019 and August 2019. The left lower lobe, was also stable. In terms of asthma she follows with an allergist. She is on ACE inhibitor but she says she has no cough and she does not  feel a need for medication to be discontinued.   Ct Chest Wo Contrast  Result Date: 11/27/2017 CLINICAL DATA:  Pulmonary nodule follow-up. EXAM: CT CHEST WITHOUT CONTRAST TECHNIQUE: Multidetector CT imaging of the chest was performed following the standard protocol without IV contrast. COMPARISON:  08/20/2017 FINDINGS: Cardiovascular: The heart size is normal. No substantial pericardial effusion. Coronary artery calcification is evident. Atherosclerotic calcification is noted in the wall of the thoracic aorta. Mediastinum/Nodes: No mediastinal lymphadenopathy. No evidence  for gross hilar lymphadenopathy although assessment is limited by the lack of intravenous contrast on today's study. The esophagus has normal imaging features. There is no axillary lymphadenopathy. Lungs/Pleura: The central tracheobronchial airways are patent. Scattered peripheral tiny pulmonary nodules are similar to prior. 1.7 cm nodule left lower lobe contains central fat density consistent with benign hamartoma. 9 mm peripheral left lower lobe nodule seen on the prior study persists at 9 mm today (3:109). No pleural effusion. Upper Abdomen: Stable 15 mm right adrenal adenoma. Musculoskeletal: No worrisome lytic or sclerotic osseous abnormality. IMPRESSION: 1. 9 mm left lower lobe pulmonary nodule is stable in the interval. Three-month stable imaging follow-up is reassuring. Repeat CT chest without contrast in 6-12 months recommended to ensure continued stability. 2. Stable 1.7 cm benign pulmonary hamartoma. 3. No change scattered tiny peripheral nodules in both lungs. 4.  Aortic Atherosclerois (ICD10-170.0) Electronically Signed   By: Misty Stanley M.D.   On: 11/27/2017 16:11       has a past medical history of Arthritis, Asthma, GERD (gastroesophageal reflux disease), Heart murmur, and Hypertension.   reports that she has never smoked. She has never used smokeless tobacco.  Past Surgical History:  Procedure Laterality Date  . CHOLECYSTECTOMY    . TONSILLECTOMY    . TOTAL HIP ARTHROPLASTY Right 08/22/2014   Procedure: RIGHT TOTAL HIP ARTHROPLASTY ANTERIOR APPROACH;  Surgeon: Paralee Cancel, MD;  Location: [facility];  Service: Orthopedics;  Laterality: Right;    Allergies  Allergen Reactions  . Advair Diskus [Fluticasone-Salmeterol]     Per patient, she thinks she developed walking PNA.   Marland Kitchen Avelox [Moxifloxacin Hcl In Nacl] Other (See Comments)    Muscle Aches  . Levofloxacin Other (See Comments)    Tendon pain  . Protonix [Pantoprazole Sodium] Diarrhea     There is no immunization history  on file for this patient.  Family History  Problem Relation Age of Onset  . Heart disease Father   . Allergic rhinitis Neg Hx   . Angioedema Neg Hx   . Asthma Neg Hx   . Eczema Neg Hx   . Immunodeficiency Neg Hx   . Urticaria Neg Hx      Current Outpatient Medications:  .  albuterol (PROAIR HFA) 108 (90 Base) MCG/ACT inhaler, Inhale 2 puffs into the lungs every 4 (four) hours as needed for wheezing or shortness of breath., Disp: 1 Inhaler, Rfl: 1 .  albuterol (PROVENTIL) (2.5 MG/3ML) 0.083% nebulizer solution, USE 1 VIAL IN NEBULIZER EVERY 4 HOURS AS NEEDED FOR WHEEZING OR  SHORTNESS  OF  BREATH, Disp: 600 mL, Rfl: 0 .  Azelastine HCl 0.15 % SOLN, Place 2 sprays into the nose daily., Disp: , Rfl:  .  bimatoprost (LUMIGAN) 0.03 % ophthalmic solution, Place 1 drop into both eyes at bedtime., Disp: , Rfl:  .  budesonide-formoterol (SYMBICORT) 160-4.5 MCG/ACT inhaler, Inhale 2 puffs into the lungs 2 (two) times daily., Disp: 1 Inhaler, Rfl: 5 .  calcium-vitamin D (OSCAL WITH D) 500-200 MG-UNIT per tablet,  Take 1 tablet by mouth daily with breakfast., Disp: , Rfl:  .  diazepam (VALIUM) 5 MG tablet, Take 5 mg by mouth every 6 (six) hours as needed for anxiety., Disp: , Rfl:  .  EPINEPHrine (EPIPEN 2-PAK) 0.3 mg/0.3 mL IJ SOAJ injection, USE AS DIRECTED FOR SEVERE ALLERGIC REACTION, Disp: 2 Device, Rfl: 1 .  ferrous sulfate 325 (65 FE) MG tablet, Take 1 tablet (325 mg total) by mouth 3 (three) times daily after meals. (Patient taking differently: Take 325 mg by mouth daily with breakfast. ), Disp: , Rfl: 3 .  fexofenadine (ALLEGRA) 180 MG tablet, Take 180 mg by mouth daily., Disp: , Rfl:  .  fluticasone (FLONASE) 50 MCG/ACT nasal spray, Place 1-2 sprays into both nostrils daily as needed for allergies or rhinitis., Disp: 16 g, Rfl: 5 .  furosemide (LASIX) 20 MG tablet, Take 20 mg by mouth every morning., Disp: , Rfl:  .  glucosamine-chondroitin 500-400 MG tablet, Take 1 tablet by mouth 2 (two)  times daily., Disp: , Rfl:  .  lisinopril (PRINIVIL,ZESTRIL) 40 MG tablet, Take 40 mg by mouth at bedtime., Disp: , Rfl:  .  montelukast (SINGULAIR) 10 MG tablet, Take 1 tablet (10 mg total) by mouth at bedtime., Disp: 30 tablet, Rfl: 5 .  Multiple Vitamin (MULTIVITAMIN WITH MINERALS) TABS tablet, Take 1 tablet by mouth daily., Disp: , Rfl:  .  Omega-3 Fatty Acids (OMEGA 3 PO), Take 1 capsule by mouth daily., Disp: , Rfl:  .  omeprazole (PRILOSEC) 20 MG capsule, Take 1 capsule (20 mg total) by mouth daily., Disp: 30 capsule, Rfl: 5 .  PRESCRIPTION MEDICATION, once a week., Disp: , Rfl:  .  SPIRIVA RESPIMAT 1.25 MCG/ACT AERS, INHALE 2 PUFFS INTO THE LUNGS DAILY., Disp: 4 g, Rfl: 3 .  tiotropium (SPIRIVA HANDIHALER) 18 MCG inhalation capsule, INHALE 1 PUFF BY MOUTH EVERY MORNING TO PREVENT COUGH OR WHEEZE, Disp: 30 capsule, Rfl: 5 .  vitamin C (ASCORBIC ACID) 500 MG tablet, Take 500 mg by mouth daily., Disp: , Rfl:  .  XOLAIR 150 MG injection, INJECT 300 MG SUBCUTANEOUSLY EVERY 4 WEEKS -REFRIGERATE DO NOT FREEZE, Disp: 6 each, Rfl: 3  Current Facility-Administered Medications:  .  omalizumab Arvid Right) injection 300 mg, 300 mg, Subcutaneous, Q28 days, Kozlow, Donnamarie Poag, MD, 300 mg at 11/10/17 1141   Review of Systems     Objective:   Physical Exam  Constitutional: She is oriented to person, place, and time. She appears well-developed and well-nourished. No distress.  HENT:  Head: Normocephalic and atraumatic.  Right Ear: External ear normal.  Left Ear: External ear normal.  Mouth/Throat: Oropharynx is clear and moist. No oropharyngeal exudate.  Eyes: Pupils are equal, round, and reactive to light. Conjunctivae and EOM are normal. Right eye exhibits no discharge. Left eye exhibits no discharge. No scleral icterus.  Neck: Normal range of motion. Neck supple. No JVD present. No tracheal deviation present. No thyromegaly present.  Cardiovascular: Normal rate, regular rhythm, normal heart sounds and  intact distal pulses. Exam reveals no gallop and no friction rub.  No murmur heard. Pulmonary/Chest: Effort normal and breath sounds normal. No respiratory distress. She has no wheezes. She has no rales. She exhibits no tenderness.  Abdominal: Soft. Bowel sounds are normal. She exhibits no distension and no mass. There is no tenderness. There is no rebound and no guarding.  Musculoskeletal: Normal range of motion. She exhibits no edema or tenderness.  Lymphadenopathy:    She has no cervical  adenopathy.  Neurological: She is alert and oriented to person, place, and time. She has normal reflexes. No cranial nerve deficit. She exhibits normal muscle tone. Coordination normal.  Skin: Skin is warm and dry. No rash noted. She is not diaphoretic. No erythema. No pallor.  Psychiatric: She has a normal mood and affect. Her behavior is normal. Judgment and thought content normal.  Vitals reviewed.  / Vitals:   11/30/17 1122  BP: 136/78  Pulse: 88  SpO2: 97%  Weight: 232 lb 3.2 oz (105.3 kg)  Height: 5\' 3"  (1.6 m)    Estimated body mass index is 41.13 kg/m as calculated from the following:   Height as of this encounter: 5\' 3"  (1.6 m).   Weight as of this encounter: 232 lb 3.2 oz (105.3 kg).      Assessment:       ICD-10-CM   1. Nodule of lower lobe of left lung R91.1   2. Pulmonary hamartoma ([place]) Q85.9   3. History of asthma Z87.09        Plan:     Nodule of lower lobe of left lung 42mm in CT may 10,2019 - > no change Aug 2019 - lower- intermediate prob for malignancy  - do repeat CT chest without contrast in mid April 2020; 9 months - we will scan you serially through 2021 summer for stability  Pulmonary hamartoma [facility])  - this is seen in left lung lower part and stable in CT scan May 2019 and Aug 2019  = no active folllowup  History of asthma - per allergist   Followup April 2020 after CT chest     Dr. Brand Males, M.D., [facility].C.P Pulmonary and Critical Care  Medicine Staff Physician, [place] Director - Interstitial Lung Disease  Program  Pulmonary [place] [facility] [place], [place], [address]  Pager: [phone], If no answer or between  15:00h - 7:00h: call [phone] Telephone: [phone]

## 2017-11-30 NOTE — Patient Instructions (Addendum)
Nodule of lower lobe of left lung 19mm in CT may 10,2019 - > no change Aug 2019 - lower- intermediate prob for malignancy  - do repeat CT chest without contrast in mid April 2020; 9 months - we will scan you serially through 2021 summer for stability  Pulmonary hamartoma [facility])  - this is seen in left lung lower part and stable in CT scan May 2019 and Aug 2019  = no active folllowup  History of asthma - per allergist   Followup April 2020 after CT chest

## 2017-12-01 ENCOUNTER — Ambulatory Visit (INDEPENDENT_AMBULATORY_CARE_PROVIDER_SITE_OTHER): Payer: BLUE CROSS/BLUE SHIELD | Admitting: *Deleted

## 2017-12-01 ENCOUNTER — Other Ambulatory Visit: Payer: Self-pay | Admitting: Allergy and Immunology

## 2017-12-01 DIAGNOSIS — J309 Allergic rhinitis, unspecified: Secondary | ICD-10-CM

## 2017-12-01 DIAGNOSIS — J4550 Severe persistent asthma, uncomplicated: Secondary | ICD-10-CM

## 2017-12-07 ENCOUNTER — Ambulatory Visit (INDEPENDENT_AMBULATORY_CARE_PROVIDER_SITE_OTHER): Payer: BLUE CROSS/BLUE SHIELD | Admitting: *Deleted

## 2017-12-07 DIAGNOSIS — J4540 Moderate persistent asthma, uncomplicated: Secondary | ICD-10-CM

## 2017-12-07 DIAGNOSIS — J309 Allergic rhinitis, unspecified: Secondary | ICD-10-CM | POA: Diagnosis not present

## 2017-12-08 ENCOUNTER — Ambulatory Visit (INDEPENDENT_AMBULATORY_CARE_PROVIDER_SITE_OTHER): Payer: BLUE CROSS/BLUE SHIELD | Admitting: *Deleted

## 2017-12-08 DIAGNOSIS — J4540 Moderate persistent asthma, uncomplicated: Secondary | ICD-10-CM | POA: Diagnosis not present

## 2017-12-15 ENCOUNTER — Ambulatory Visit (INDEPENDENT_AMBULATORY_CARE_PROVIDER_SITE_OTHER): Payer: BLUE CROSS/BLUE SHIELD

## 2017-12-15 DIAGNOSIS — J309 Allergic rhinitis, unspecified: Secondary | ICD-10-CM

## 2017-12-21 ENCOUNTER — Ambulatory Visit (INDEPENDENT_AMBULATORY_CARE_PROVIDER_SITE_OTHER): Payer: BLUE CROSS/BLUE SHIELD | Admitting: *Deleted

## 2017-12-21 DIAGNOSIS — J309 Allergic rhinitis, unspecified: Secondary | ICD-10-CM

## 2017-12-28 DIAGNOSIS — Z124 Encounter for screening for malignant neoplasm of cervix: Secondary | ICD-10-CM | POA: Diagnosis not present

## 2017-12-28 DIAGNOSIS — Z6838 Body mass index (BMI) 38.0-38.9, adult: Secondary | ICD-10-CM | POA: Diagnosis not present

## 2017-12-28 DIAGNOSIS — Z1231 Encounter for screening mammogram for malignant neoplasm of breast: Secondary | ICD-10-CM | POA: Diagnosis not present

## 2017-12-28 DIAGNOSIS — Z01419 Encounter for gynecological examination (general) (routine) without abnormal findings: Secondary | ICD-10-CM | POA: Diagnosis not present

## 2017-12-29 ENCOUNTER — Ambulatory Visit (INDEPENDENT_AMBULATORY_CARE_PROVIDER_SITE_OTHER): Payer: BLUE CROSS/BLUE SHIELD | Admitting: *Deleted

## 2017-12-29 DIAGNOSIS — J309 Allergic rhinitis, unspecified: Secondary | ICD-10-CM | POA: Diagnosis not present

## 2018-01-04 ENCOUNTER — Ambulatory Visit (INDEPENDENT_AMBULATORY_CARE_PROVIDER_SITE_OTHER): Payer: BLUE CROSS/BLUE SHIELD

## 2018-01-04 DIAGNOSIS — J4550 Severe persistent asthma, uncomplicated: Secondary | ICD-10-CM | POA: Diagnosis not present

## 2018-01-04 DIAGNOSIS — J309 Allergic rhinitis, unspecified: Secondary | ICD-10-CM | POA: Diagnosis not present

## 2018-01-05 ENCOUNTER — Ambulatory Visit (INDEPENDENT_AMBULATORY_CARE_PROVIDER_SITE_OTHER): Payer: BLUE CROSS/BLUE SHIELD | Admitting: *Deleted

## 2018-01-05 DIAGNOSIS — J4550 Severe persistent asthma, uncomplicated: Secondary | ICD-10-CM | POA: Diagnosis not present

## 2018-01-07 NOTE — Progress Notes (Signed)
Vial exp 01-09-19

## 2018-01-08 DIAGNOSIS — J3089 Other allergic rhinitis: Secondary | ICD-10-CM

## 2018-01-11 ENCOUNTER — Ambulatory Visit (INDEPENDENT_AMBULATORY_CARE_PROVIDER_SITE_OTHER): Payer: BLUE CROSS/BLUE SHIELD | Admitting: *Deleted

## 2018-01-11 DIAGNOSIS — J309 Allergic rhinitis, unspecified: Secondary | ICD-10-CM

## 2018-01-18 ENCOUNTER — Ambulatory Visit: Payer: BLUE CROSS/BLUE SHIELD | Admitting: Allergy and Immunology

## 2018-01-18 ENCOUNTER — Encounter: Payer: Self-pay | Admitting: Allergy and Immunology

## 2018-01-18 VITALS — BP 142/88 | HR 86 | Resp 12 | Ht 63.0 in | Wt 228.0 lb

## 2018-01-18 DIAGNOSIS — K2100 Gastro-esophageal reflux disease with esophagitis, without bleeding: Secondary | ICD-10-CM

## 2018-01-18 DIAGNOSIS — J3089 Other allergic rhinitis: Secondary | ICD-10-CM | POA: Diagnosis not present

## 2018-01-18 DIAGNOSIS — J4550 Severe persistent asthma, uncomplicated: Secondary | ICD-10-CM

## 2018-01-18 MED ORDER — BUDESONIDE-FORMOTEROL FUMARATE 160-4.5 MCG/ACT IN AERO: 2.0000 | INHALATION_SPRAY | Freq: Two times a day (BID) | RESPIRATORY_TRACT | 5 refills | Status: DC

## 2018-01-18 MED ORDER — ALBUTEROL SULFATE HFA 108 (90 BASE) MCG/ACT IN AERS: 2.0000 | INHALATION_SPRAY | RESPIRATORY_TRACT | 1 refills | Status: DC | PRN

## 2018-01-18 MED ORDER — TIOTROPIUM BROMIDE MONOHYDRATE 18 MCG IN CAPS: ORAL_CAPSULE | RESPIRATORY_TRACT | 1 refills | Status: DC

## 2018-01-18 MED ORDER — FLUTICASONE PROPIONATE 50 MCG/ACT NA SUSP: 2.0000 | Freq: Every day | NASAL | 5 refills | Status: DC

## 2018-01-18 MED ORDER — AZELASTINE HCL 0.15 % NA SOLN: 2.0000 | Freq: Two times a day (BID) | NASAL | 5 refills | Status: DC

## 2018-01-18 NOTE — Assessment & Plan Note (Signed)
   Continue appropriate allergen avoidance measures, montelukast 10 mg daily, azelastine nasal spray and fluticasone nasal spray as needed.  Nasal saline lavage (NeilMed) has been recommended as needed and prior to medicated nasal sprays along with instructions for proper administration.  For thick post nasal drainage, add guaifenesin [phone] mg (Mucinex)  twice daily as needed with adequate hydration as discussed.

## 2018-01-18 NOTE — Patient Instructions (Addendum)
Severe persistent asthma Stable.  Continue omalizumab injections monthly, Symbicort 160/4.5 g 2 inhalations via spacer device twice a day, Spiriva Respimat 1.25 g, 2 inhalations daily, montelukast 10 mg daily, and albuterol every 4-6 hours as needed.  Subjective and objective measures of pulmonary function will be followed and the treatment plan will be adjusted accordingly.  Allergic rhinitis  Continue appropriate allergen avoidance measures, montelukast 10 mg daily, azelastine nasal spray and fluticasone nasal spray as needed.  Nasal saline lavage (NeilMed) has been recommended as needed and prior to medicated nasal sprays along with instructions for proper administration.  For thick post nasal drainage, add guaifenesin [phone] mg (Mucinex)  twice daily as needed with adequate hydration as discussed.  GERD (gastroesophageal reflux disease)  Continue reflux lifestyle modifications and omeprazole as prescribed.   Return in about 5 months (around 06/19/2018), or if symptoms worsen or fail to improve.

## 2018-01-18 NOTE — Progress Notes (Signed)
Follow-up Note  RE: Melanie Armstrong MRN: [id number] DOB: 02-Jan-1956 Date of Office Visit: 01/18/2018  Primary care provider: [name], MD Referring provider: [name], MD  History of present illness: Melanie Armstrong is a 62 y.o. female with persistent asthma, allergic rhinitis, and esophageal reflux presenting today for follow-up.  She was last seen in this clinic on Aug 18, 2017.  Reports that the left lung nodule is stable and she will undergo imaging again in approximately 9 months.  She reports that her asthma symptoms have been well controlled.  She rarely requires albuterol rescue, typically with strong aromas such as perfumes at work, and rarely experiences nocturnal awakenings due to lower respiratory symptoms, typically with rapid weather changes.  She denies limitations in normal daily activities.  She is currently receiving omalizumab injections every 4 weeks, Symbicort 160-4.5 g, 2 inhalations via spacer device twice daily, Spiriva 1.25 g 2 inhalations daily, and montelukast 10 mg daily.  Her nasal allergy symptoms are currently well controlled with fluticasone nasal spray in the morning and azelastine nasal spray at nighttime.  She reports that her heartburn is well controlled with omeprazole.  Assessment and plan: Severe persistent asthma Stable.  Continue omalizumab injections monthly, Symbicort 160/4.5 g 2 inhalations via spacer device twice a day, Spiriva Respimat 1.25 g, 2 inhalations daily, montelukast 10 mg daily, and albuterol every 4-6 hours as needed.  Subjective and objective measures of pulmonary function will be followed and the treatment plan will be adjusted accordingly.  Allergic rhinitis  Continue appropriate allergen avoidance measures, montelukast 10 mg daily, azelastine nasal spray and fluticasone nasal spray as needed.  Nasal saline lavage (NeilMed) has been recommended as needed and prior to medicated nasal sprays along with instructions  for proper administration.  For thick post nasal drainage, add guaifenesin [phone] mg (Mucinex)  twice daily as needed with adequate hydration as discussed.  GERD (gastroesophageal reflux disease)  Continue reflux lifestyle modifications and omeprazole as prescribed.   Meds ordered this encounter  Medications  . tiotropium (SPIRIVA HANDIHALER) 18 MCG inhalation capsule    Sig: INHALE 1 PUFF BY MOUTH EVERY MORNING TO PREVENT COUGH OR WHEEZE    Dispense:  30 capsule    Refill:  1  . albuterol (PROAIR HFA) 108 (90 Base) MCG/ACT inhaler    Sig: Inhale 2 puffs into the lungs every 4 (four) hours as needed for wheezing or shortness of breath.    Dispense:  1 Inhaler    Refill:  1  . budesonide-formoterol (SYMBICORT) 160-4.5 MCG/ACT inhaler    Sig: Inhale 2 puffs into the lungs 2 (two) times daily.    Dispense:  1 Inhaler    Refill:  5  . Azelastine HCl 0.15 % SOLN    Sig: Place 2 sprays into both nostrils 2 (two) times daily.    Dispense:  30 mL    Refill:  5  . fluticasone (FLONASE) 50 MCG/ACT nasal spray    Sig: Place 2 sprays into both nostrils daily.    Dispense:  16 g    Refill:  5    Diagnostics: Spirometry:  Normal with an FEV1 of 105% predicted.  Please see scanned spirometry results for details.    Physical examination: Blood pressure (!) 142/88, pulse 86, resp. rate 12, height 5\' 3"  (1.6 m), weight 228 lb (103.4 kg), SpO2 94 %.  General: Alert, interactive, in no acute distress. HEENT: TMs pearly gray, turbinates mildly edematous without discharge, post-pharynx unremarkable. Neck: Supple  without lymphadenopathy. Lungs: Clear to auscultation without wheezing, rhonchi or rales. CV: Normal S1, S2 without murmurs. Skin: Warm and dry, without lesions or rashes.  The following portions of the patient's history were reviewed and updated as appropriate: allergies, current medications, past family history, past medical history, past social history, past surgical history and  problem list.  Allergies as of 01/18/2018      Reactions   Advair Diskus [fluticasone-salmeterol]    Per patient, she thinks she developed walking PNA.    Avelox [moxifloxacin Hcl In Nacl] Other (See Comments)   Muscle Aches   Levofloxacin Other (See Comments)   Tendon pain   Protonix [pantoprazole Sodium] Diarrhea      Medication List        Accurate as of 01/18/18 12:07 PM. Always use your most recent med list.          albuterol (2.5 MG/3ML) 0.083% nebulizer solution Commonly known as:  PROVENTIL USE 1 VIAL IN NEBULIZER EVERY 4 HOURS AS NEEDED FOR WHEEZING OR  SHORTNESS  OF  BREATH   albuterol 108 (90 Base) MCG/ACT inhaler Commonly known as:  PROVENTIL HFA;VENTOLIN HFA Inhale 2 puffs into the lungs every 4 (four) hours as needed for wheezing or shortness of breath.   Azelastine HCl 0.15 % Soln Place 2 sprays into the nose daily.   Azelastine HCl 0.15 % Soln Place 2 sprays into both nostrils 2 (two) times daily.   bimatoprost 0.03 % ophthalmic solution Commonly known as:  LUMIGAN Place 1 drop into both eyes at bedtime.   budesonide-formoterol 160-4.5 MCG/ACT inhaler Commonly known as:  SYMBICORT Inhale 2 puffs into the lungs 2 (two) times daily.   calcium-vitamin D 500-200 MG-UNIT tablet Commonly known as:  OSCAL WITH D Take 1 tablet by mouth daily with breakfast.   diazepam 5 MG tablet Commonly known as:  VALIUM Take 5 mg by mouth every 6 (six) hours as needed for anxiety.   EPINEPHrine 0.3 mg/0.3 mL Soaj injection Commonly known as:  EPI-PEN USE AS DIRECTED FOR SEVERE ALLERGIC REACTION   ferrous sulfate 325 (65 FE) MG tablet Take 1 tablet (325 mg total) by mouth 3 (three) times daily after meals.   fexofenadine 180 MG tablet Commonly known as:  ALLEGRA Take 180 mg by mouth daily.   fluticasone 50 MCG/ACT nasal spray Commonly known as:  FLONASE Place 1-2 sprays into both nostrils daily as needed for allergies or rhinitis.   fluticasone 50 MCG/ACT  nasal spray Commonly known as:  FLONASE Place 2 sprays into both nostrils daily.   furosemide 20 MG tablet Commonly known as:  LASIX Take 20 mg by mouth every morning.   glucosamine-chondroitin 500-400 MG tablet Take 1 tablet by mouth 2 (two) times daily.   lisinopril 40 MG tablet Commonly known as:  PRINIVIL,ZESTRIL Take 40 mg by mouth at bedtime.   montelukast 10 MG tablet Commonly known as:  SINGULAIR Take 1 tablet (10 mg total) by mouth at bedtime.   multivitamin with minerals Tabs tablet Take 1 tablet by mouth daily.   OMEGA 3 PO Take 1 capsule by mouth daily.   omeprazole 20 MG capsule Commonly known as:  PRILOSEC Take 1 capsule (20 mg total) by mouth daily.   PRESCRIPTION MEDICATION once a week.   SPIRIVA RESPIMAT 1.25 MCG/ACT Aers Generic drug:  Tiotropium Bromide Monohydrate INHALE 2 PUFFS INTO THE LUNGS DAILY.   tiotropium 18 MCG inhalation capsule Commonly known as:  SPIRIVA INHALE 1 PUFF BY MOUTH EVERY MORNING  TO PREVENT COUGH OR WHEEZE   vitamin C 500 MG tablet Commonly known as:  ASCORBIC ACID Take 500 mg by mouth daily.   XOLAIR 150 MG injection Generic drug:  omalizumab INJECT 300 MG SUBCUTANEOUSLY EVERY 4 WEEKS -REFRIGERATE DO NOT FREEZE       Allergies  Allergen Reactions  . Advair Diskus [Fluticasone-Salmeterol]     Per patient, she thinks she developed walking PNA.   Marland Kitchen Avelox [Moxifloxacin Hcl In Nacl] Other (See Comments)    Muscle Aches  . Levofloxacin Other (See Comments)    Tendon pain  . Protonix [Pantoprazole Sodium] Diarrhea    Review of systems: Review of systems negative except as noted in HPI / PMHx or noted below: Constitutional: Negative.  HENT: Negative.   Eyes: Negative.  Respiratory: Negative.   Cardiovascular: Negative.  Gastrointestinal: Negative.  Genitourinary: Negative.  Musculoskeletal: Negative.  Neurological: Negative.  Endo/Heme/Allergies: Negative.  Cutaneous: Negative.  Past Medical History:    Diagnosis Date  . Arthritis   . Asthma   . GERD (gastroesophageal reflux disease)   . Heart murmur    as a child   . Hypertension     Family History  Problem Relation Age of Onset  . Heart disease Father   . Allergic rhinitis Neg Hx   . Angioedema Neg Hx   . Asthma Neg Hx   . Eczema Neg Hx   . Immunodeficiency Neg Hx   . Urticaria Neg Hx     Social History   Socioeconomic History  . Marital status: Married    Spouse name: Not on file  . Number of children: Not on file  . Years of education: Not on file  . Highest education level: Not on file  Occupational History  . Not on file  Social Needs  . Financial resource strain: Not on file  . Food insecurity:    Worry: Not on file    Inability: Not on file  . Transportation needs:    Medical: Not on file    Non-medical: Not on file  Tobacco Use  . Smoking status: Never Smoker  . Smokeless tobacco: Never Used  Substance and Sexual Activity  . Alcohol use: No  . Drug use: No  . Sexual activity: Not on file  Lifestyle  . Physical activity:    Days per week: Not on file    Minutes per session: Not on file  . Stress: Not on file  Relationships  . Social connections:    Talks on phone: Not on file    Gets together: Not on file    Attends religious service: Not on file    Active member of club or organization: Not on file    Attends meetings of clubs or organizations: Not on file    Relationship status: Not on file  . Intimate partner violence:    Fear of current or ex partner: Not on file    Emotionally abused: Not on file    Physically abused: Not on file    Forced sexual activity: Not on file  Other Topics Concern  . Not on file  Social History Narrative  . Not on file    I appreciate the opportunity to take part in [place] care. Please do not hesitate to contact me with questions.  Sincerely,   R. Edgar Frisk, MD

## 2018-01-18 NOTE — Assessment & Plan Note (Signed)
   Continue reflux lifestyle modifications and omeprazole as prescribed. 

## 2018-01-18 NOTE — Assessment & Plan Note (Signed)
Stable.  Continue omalizumab injections monthly, Symbicort 160/4.5 g 2 inhalations via spacer device twice a day, Spiriva Respimat 1.25 g, 2 inhalations daily, montelukast 10 mg daily, and albuterol every 4-6 hours as needed.  Subjective and objective measures of pulmonary function will be followed and the treatment plan will be adjusted accordingly. 

## 2018-01-19 ENCOUNTER — Encounter: Payer: Self-pay | Admitting: Allergy and Immunology

## 2018-01-28 ENCOUNTER — Ambulatory Visit (INDEPENDENT_AMBULATORY_CARE_PROVIDER_SITE_OTHER): Payer: BLUE CROSS/BLUE SHIELD | Admitting: *Deleted

## 2018-01-28 DIAGNOSIS — J309 Allergic rhinitis, unspecified: Secondary | ICD-10-CM

## 2018-01-29 ENCOUNTER — Other Ambulatory Visit: Payer: Self-pay | Admitting: Allergy and Immunology

## 2018-01-29 DIAGNOSIS — J4550 Severe persistent asthma, uncomplicated: Secondary | ICD-10-CM

## 2018-02-01 ENCOUNTER — Ambulatory Visit (INDEPENDENT_AMBULATORY_CARE_PROVIDER_SITE_OTHER): Payer: BLUE CROSS/BLUE SHIELD

## 2018-02-01 DIAGNOSIS — J4550 Severe persistent asthma, uncomplicated: Secondary | ICD-10-CM

## 2018-02-01 DIAGNOSIS — J309 Allergic rhinitis, unspecified: Secondary | ICD-10-CM | POA: Diagnosis not present

## 2018-02-02 ENCOUNTER — Ambulatory Visit (INDEPENDENT_AMBULATORY_CARE_PROVIDER_SITE_OTHER): Payer: BLUE CROSS/BLUE SHIELD | Admitting: *Deleted

## 2018-02-02 DIAGNOSIS — J4550 Severe persistent asthma, uncomplicated: Secondary | ICD-10-CM

## 2018-02-05 DIAGNOSIS — H5213 Myopia, bilateral: Secondary | ICD-10-CM | POA: Diagnosis not present

## 2018-02-05 DIAGNOSIS — H40053 Ocular hypertension, bilateral: Secondary | ICD-10-CM | POA: Diagnosis not present

## 2018-02-05 DIAGNOSIS — H2513 Age-related nuclear cataract, bilateral: Secondary | ICD-10-CM | POA: Diagnosis not present

## 2018-02-08 ENCOUNTER — Ambulatory Visit (INDEPENDENT_AMBULATORY_CARE_PROVIDER_SITE_OTHER): Payer: BLUE CROSS/BLUE SHIELD

## 2018-02-08 DIAGNOSIS — J309 Allergic rhinitis, unspecified: Secondary | ICD-10-CM

## 2018-02-15 ENCOUNTER — Ambulatory Visit (INDEPENDENT_AMBULATORY_CARE_PROVIDER_SITE_OTHER): Payer: BLUE CROSS/BLUE SHIELD | Admitting: *Deleted

## 2018-02-15 DIAGNOSIS — J309 Allergic rhinitis, unspecified: Secondary | ICD-10-CM

## 2018-02-22 ENCOUNTER — Ambulatory Visit (INDEPENDENT_AMBULATORY_CARE_PROVIDER_SITE_OTHER): Payer: BLUE CROSS/BLUE SHIELD | Admitting: *Deleted

## 2018-02-22 DIAGNOSIS — J309 Allergic rhinitis, unspecified: Secondary | ICD-10-CM

## 2018-03-01 ENCOUNTER — Ambulatory Visit (INDEPENDENT_AMBULATORY_CARE_PROVIDER_SITE_OTHER): Payer: BLUE CROSS/BLUE SHIELD | Admitting: *Deleted

## 2018-03-01 DIAGNOSIS — J309 Allergic rhinitis, unspecified: Secondary | ICD-10-CM | POA: Diagnosis not present

## 2018-03-02 DIAGNOSIS — J4550 Severe persistent asthma, uncomplicated: Secondary | ICD-10-CM | POA: Diagnosis not present

## 2018-03-03 ENCOUNTER — Ambulatory Visit (INDEPENDENT_AMBULATORY_CARE_PROVIDER_SITE_OTHER): Payer: BLUE CROSS/BLUE SHIELD | Admitting: *Deleted

## 2018-03-03 DIAGNOSIS — J4550 Severe persistent asthma, uncomplicated: Secondary | ICD-10-CM | POA: Diagnosis not present

## 2018-03-05 DIAGNOSIS — J0100 Acute maxillary sinusitis, unspecified: Secondary | ICD-10-CM | POA: Diagnosis not present

## 2018-03-05 DIAGNOSIS — I10 Essential (primary) hypertension: Secondary | ICD-10-CM | POA: Diagnosis not present

## 2018-03-05 DIAGNOSIS — R600 Localized edema: Secondary | ICD-10-CM | POA: Diagnosis not present

## 2018-03-05 DIAGNOSIS — J301 Allergic rhinitis due to pollen: Secondary | ICD-10-CM | POA: Diagnosis not present

## 2018-03-08 ENCOUNTER — Ambulatory Visit (INDEPENDENT_AMBULATORY_CARE_PROVIDER_SITE_OTHER): Payer: BLUE CROSS/BLUE SHIELD | Admitting: *Deleted

## 2018-03-08 DIAGNOSIS — J309 Allergic rhinitis, unspecified: Secondary | ICD-10-CM | POA: Diagnosis not present

## 2018-03-15 ENCOUNTER — Ambulatory Visit (INDEPENDENT_AMBULATORY_CARE_PROVIDER_SITE_OTHER): Payer: BLUE CROSS/BLUE SHIELD | Admitting: *Deleted

## 2018-03-15 DIAGNOSIS — J309 Allergic rhinitis, unspecified: Secondary | ICD-10-CM | POA: Diagnosis not present

## 2018-03-23 ENCOUNTER — Ambulatory Visit (INDEPENDENT_AMBULATORY_CARE_PROVIDER_SITE_OTHER): Payer: BLUE CROSS/BLUE SHIELD | Admitting: *Deleted

## 2018-03-23 DIAGNOSIS — J309 Allergic rhinitis, unspecified: Secondary | ICD-10-CM

## 2018-03-23 NOTE — Progress Notes (Signed)
VIAL EXP 03-24-19 

## 2018-03-24 DIAGNOSIS — J3089 Other allergic rhinitis: Secondary | ICD-10-CM | POA: Diagnosis not present

## 2018-03-29 ENCOUNTER — Ambulatory Visit (INDEPENDENT_AMBULATORY_CARE_PROVIDER_SITE_OTHER): Payer: BLUE CROSS/BLUE SHIELD | Admitting: *Deleted

## 2018-03-29 ENCOUNTER — Other Ambulatory Visit: Payer: Self-pay | Admitting: Allergy and Immunology

## 2018-03-29 DIAGNOSIS — J309 Allergic rhinitis, unspecified: Secondary | ICD-10-CM | POA: Diagnosis not present

## 2018-03-30 DIAGNOSIS — J4550 Severe persistent asthma, uncomplicated: Secondary | ICD-10-CM | POA: Diagnosis not present

## 2018-03-31 ENCOUNTER — Ambulatory Visit (INDEPENDENT_AMBULATORY_CARE_PROVIDER_SITE_OTHER): Payer: BLUE CROSS/BLUE SHIELD | Admitting: *Deleted

## 2018-03-31 DIAGNOSIS — J4550 Severe persistent asthma, uncomplicated: Secondary | ICD-10-CM | POA: Diagnosis not present

## 2018-04-05 ENCOUNTER — Ambulatory Visit (INDEPENDENT_AMBULATORY_CARE_PROVIDER_SITE_OTHER): Payer: BLUE CROSS/BLUE SHIELD

## 2018-04-05 DIAGNOSIS — J309 Allergic rhinitis, unspecified: Secondary | ICD-10-CM

## 2018-04-12 ENCOUNTER — Ambulatory Visit (INDEPENDENT_AMBULATORY_CARE_PROVIDER_SITE_OTHER): Payer: BLUE CROSS/BLUE SHIELD | Admitting: *Deleted

## 2018-04-12 DIAGNOSIS — J309 Allergic rhinitis, unspecified: Secondary | ICD-10-CM | POA: Diagnosis not present

## 2018-04-19 ENCOUNTER — Ambulatory Visit (INDEPENDENT_AMBULATORY_CARE_PROVIDER_SITE_OTHER): Payer: BLUE CROSS/BLUE SHIELD | Admitting: *Deleted

## 2018-04-19 DIAGNOSIS — J309 Allergic rhinitis, unspecified: Secondary | ICD-10-CM

## 2018-04-21 DIAGNOSIS — R0602 Shortness of breath: Secondary | ICD-10-CM | POA: Diagnosis not present

## 2018-04-21 DIAGNOSIS — J0101 Acute recurrent maxillary sinusitis: Secondary | ICD-10-CM | POA: Diagnosis not present

## 2018-04-21 DIAGNOSIS — M25569 Pain in unspecified knee: Secondary | ICD-10-CM | POA: Diagnosis not present

## 2018-04-21 DIAGNOSIS — J45909 Unspecified asthma, uncomplicated: Secondary | ICD-10-CM | POA: Diagnosis not present

## 2018-04-26 ENCOUNTER — Ambulatory Visit (INDEPENDENT_AMBULATORY_CARE_PROVIDER_SITE_OTHER): Payer: BLUE CROSS/BLUE SHIELD

## 2018-04-26 DIAGNOSIS — J309 Allergic rhinitis, unspecified: Secondary | ICD-10-CM | POA: Diagnosis not present

## 2018-04-27 DIAGNOSIS — J4550 Severe persistent asthma, uncomplicated: Secondary | ICD-10-CM | POA: Diagnosis not present

## 2018-04-28 ENCOUNTER — Ambulatory Visit (INDEPENDENT_AMBULATORY_CARE_PROVIDER_SITE_OTHER): Payer: BLUE CROSS/BLUE SHIELD | Admitting: *Deleted

## 2018-04-28 DIAGNOSIS — J4550 Severe persistent asthma, uncomplicated: Secondary | ICD-10-CM | POA: Diagnosis not present

## 2018-05-03 ENCOUNTER — Ambulatory Visit (INDEPENDENT_AMBULATORY_CARE_PROVIDER_SITE_OTHER): Payer: BLUE CROSS/BLUE SHIELD | Admitting: *Deleted

## 2018-05-03 DIAGNOSIS — J309 Allergic rhinitis, unspecified: Secondary | ICD-10-CM

## 2018-05-05 DIAGNOSIS — H1032 Unspecified acute conjunctivitis, left eye: Secondary | ICD-10-CM | POA: Diagnosis not present

## 2018-05-06 DIAGNOSIS — J019 Acute sinusitis, unspecified: Secondary | ICD-10-CM | POA: Diagnosis not present

## 2018-05-06 DIAGNOSIS — R0602 Shortness of breath: Secondary | ICD-10-CM | POA: Diagnosis not present

## 2018-05-12 ENCOUNTER — Ambulatory Visit (INDEPENDENT_AMBULATORY_CARE_PROVIDER_SITE_OTHER): Payer: BLUE CROSS/BLUE SHIELD | Admitting: *Deleted

## 2018-05-12 DIAGNOSIS — H1032 Unspecified acute conjunctivitis, left eye: Secondary | ICD-10-CM | POA: Diagnosis not present

## 2018-05-12 DIAGNOSIS — J309 Allergic rhinitis, unspecified: Secondary | ICD-10-CM | POA: Diagnosis not present

## 2018-05-12 DIAGNOSIS — H10412 Chronic giant papillary conjunctivitis, left eye: Secondary | ICD-10-CM | POA: Diagnosis not present

## 2018-05-17 ENCOUNTER — Ambulatory Visit (INDEPENDENT_AMBULATORY_CARE_PROVIDER_SITE_OTHER): Payer: BLUE CROSS/BLUE SHIELD | Admitting: *Deleted

## 2018-05-17 DIAGNOSIS — J309 Allergic rhinitis, unspecified: Secondary | ICD-10-CM

## 2018-05-24 ENCOUNTER — Ambulatory Visit (INDEPENDENT_AMBULATORY_CARE_PROVIDER_SITE_OTHER): Payer: BLUE CROSS/BLUE SHIELD | Admitting: *Deleted

## 2018-05-24 DIAGNOSIS — J309 Allergic rhinitis, unspecified: Secondary | ICD-10-CM | POA: Diagnosis not present

## 2018-05-25 DIAGNOSIS — J4550 Severe persistent asthma, uncomplicated: Secondary | ICD-10-CM

## 2018-05-26 ENCOUNTER — Ambulatory Visit (INDEPENDENT_AMBULATORY_CARE_PROVIDER_SITE_OTHER): Payer: BLUE CROSS/BLUE SHIELD | Admitting: *Deleted

## 2018-05-26 DIAGNOSIS — J4550 Severe persistent asthma, uncomplicated: Secondary | ICD-10-CM | POA: Diagnosis not present

## 2018-05-31 ENCOUNTER — Other Ambulatory Visit: Payer: Self-pay | Admitting: Allergy and Immunology

## 2018-06-01 ENCOUNTER — Ambulatory Visit (INDEPENDENT_AMBULATORY_CARE_PROVIDER_SITE_OTHER): Payer: BLUE CROSS/BLUE SHIELD | Admitting: *Deleted

## 2018-06-01 DIAGNOSIS — J309 Allergic rhinitis, unspecified: Secondary | ICD-10-CM | POA: Diagnosis not present

## 2018-06-02 DIAGNOSIS — M25552 Pain in left hip: Secondary | ICD-10-CM | POA: Diagnosis not present

## 2018-06-02 DIAGNOSIS — J3089 Other allergic rhinitis: Secondary | ICD-10-CM | POA: Diagnosis not present

## 2018-06-02 NOTE — Progress Notes (Signed)
VIAL EXP 06-03-2019

## 2018-06-07 ENCOUNTER — Ambulatory Visit (INDEPENDENT_AMBULATORY_CARE_PROVIDER_SITE_OTHER): Payer: BLUE CROSS/BLUE SHIELD | Admitting: *Deleted

## 2018-06-07 DIAGNOSIS — J309 Allergic rhinitis, unspecified: Secondary | ICD-10-CM | POA: Diagnosis not present

## 2018-06-08 DIAGNOSIS — H401131 Primary open-angle glaucoma, bilateral, mild stage: Secondary | ICD-10-CM | POA: Diagnosis not present

## 2018-06-14 ENCOUNTER — Ambulatory Visit (INDEPENDENT_AMBULATORY_CARE_PROVIDER_SITE_OTHER): Payer: BLUE CROSS/BLUE SHIELD

## 2018-06-14 DIAGNOSIS — J309 Allergic rhinitis, unspecified: Secondary | ICD-10-CM

## 2018-06-16 DIAGNOSIS — H04222 Epiphora due to insufficient drainage, left lacrimal gland: Secondary | ICD-10-CM | POA: Diagnosis not present

## 2018-06-16 DIAGNOSIS — H04123 Dry eye syndrome of bilateral lacrimal glands: Secondary | ICD-10-CM | POA: Diagnosis not present

## 2018-06-16 DIAGNOSIS — H04551 Acquired stenosis of right nasolacrimal duct: Secondary | ICD-10-CM | POA: Diagnosis not present

## 2018-06-16 DIAGNOSIS — J45909 Unspecified asthma, uncomplicated: Secondary | ICD-10-CM | POA: Insufficient documentation

## 2018-06-16 DIAGNOSIS — H04412 Chronic dacryocystitis of left lacrimal passage: Secondary | ICD-10-CM | POA: Diagnosis not present

## 2018-06-16 DIAGNOSIS — H04552 Acquired stenosis of left nasolacrimal duct: Secondary | ICD-10-CM | POA: Diagnosis not present

## 2018-06-21 ENCOUNTER — Ambulatory Visit: Payer: BLUE CROSS/BLUE SHIELD | Admitting: Allergy and Immunology

## 2018-06-21 ENCOUNTER — Encounter: Payer: Self-pay | Admitting: Allergy and Immunology

## 2018-06-21 VITALS — BP 126/82 | HR 89 | Resp 18 | Ht 63.0 in | Wt 230.6 lb

## 2018-06-21 DIAGNOSIS — J309 Allergic rhinitis, unspecified: Secondary | ICD-10-CM | POA: Diagnosis not present

## 2018-06-21 DIAGNOSIS — J3089 Other allergic rhinitis: Secondary | ICD-10-CM

## 2018-06-21 DIAGNOSIS — J45901 Unspecified asthma with (acute) exacerbation: Secondary | ICD-10-CM | POA: Diagnosis not present

## 2018-06-21 DIAGNOSIS — J0100 Acute maxillary sinusitis, unspecified: Secondary | ICD-10-CM

## 2018-06-21 DIAGNOSIS — J4551 Severe persistent asthma with (acute) exacerbation: Secondary | ICD-10-CM

## 2018-06-21 DIAGNOSIS — J019 Acute sinusitis, unspecified: Secondary | ICD-10-CM | POA: Insufficient documentation

## 2018-06-21 MED ORDER — AZELASTINE HCL 0.15 % NA SOLN: 2.0000 | Freq: Two times a day (BID) | NASAL | 5 refills | Status: DC

## 2018-06-21 MED ORDER — PREDNISONE 1 MG PO TABS
10.0000 mg | ORAL_TABLET | Freq: Every day | ORAL | Status: DC
Start: 2018-06-22 — End: 2018-06-23

## 2018-06-21 MED ORDER — BUDESONIDE-FORMOTEROL FUMARATE 160-4.5 MCG/ACT IN AERO: 2.0000 | INHALATION_SPRAY | Freq: Two times a day (BID) | RESPIRATORY_TRACT | 5 refills | Status: DC

## 2018-06-21 MED ORDER — FLUTICASONE PROPIONATE 50 MCG/ACT NA SUSP: 1.0000 | Freq: Every day | NASAL | 5 refills | Status: DC | PRN

## 2018-06-21 NOTE — Assessment & Plan Note (Addendum)
   Continue appropriate allergen avoidance measures and immunotherapy injections as prescribed.  Treatment plan as outlined above for acute sinusitis.

## 2018-06-21 NOTE — Patient Instructions (Addendum)
Asthma with acute exacerbation  Prednisone has been provided, 40 mg x3 days, 20 mg x3 day, 10 mg x3 day, then stop.  Continue omalizumab injections monthly, Symbicort 160/4.5 g 2 inhalations via spacer device twice a day, Spiriva Respimat 1.25 g, 2 inhalations daily, montelukast 10 mg daily, and albuterol every 4-6 hours as needed.  The patient has been asked to contact me if her symptoms persist or progress. Otherwise, she may return for follow up in 4 months.  Acute sinusitis  Prednisone has been provided (as above).  I have recommended increasing the use of azelastine nasal spray and fluticasone nasal spray to twice daily for now.  Nasal saline lavage (NeilMed) has been recommended as needed and prior to medicated nasal sprays along with instructions for proper administration.  For thick post nasal drainage, add guaifenesin [phone] mg (Mucinex)  twice daily as needed with adequate hydration as discussed.  Allergic rhinitis  Continue appropriate allergen avoidance measures and immunotherapy injections as prescribed.  Treatment plan as outlined above for acute sinusitis.   Return in about 4 months (around 10/21/2018), or if symptoms worsen or fail to improve.

## 2018-06-21 NOTE — Assessment & Plan Note (Signed)
   Prednisone has been provided (as above).  I have recommended increasing the use of azelastine nasal spray and fluticasone nasal spray to twice daily for now.  Nasal saline lavage (NeilMed) has been recommended as needed and prior to medicated nasal sprays along with instructions for proper administration.  For thick post nasal drainage, add guaifenesin [phone] mg (Mucinex)  twice daily as needed with adequate hydration as discussed.

## 2018-06-21 NOTE — Assessment & Plan Note (Addendum)
   Prednisone has been provided, 40 mg x3 days, 20 mg x3 day, 10 mg x3 day, then stop.  Continue omalizumab injections monthly, Symbicort 160/4.5 g 2 inhalations via spacer device twice a day, Spiriva Respimat 1.25 g, 2 inhalations daily, montelukast 10 mg daily, and albuterol every 4-6 hours as needed.  The patient has been asked to contact me if her symptoms persist or progress. Otherwise, she may return for follow up in 4 months.

## 2018-06-21 NOTE — Progress Notes (Signed)
Follow-up Note  RE: Melanie Armstrong MRN: [id number] DOB: December 08, 1955 Date of Office Visit: 06/21/2018  Primary care provider: [name], MD Referring provider: [name], MD  History of present illness: Melanie Armstrong is a 63 y.o. female with persistent asthma, allergic rhinitis, and esophageal reflux presenting today for follow-up.  She was last seen in this clinic in October 2019.  She reports that she had a sinus infection in January requiring 2 rounds of antibiotics.  However, since that time she has still experienced thick postnasal drainage, some nasal congestion, mild sinus pressure, coughing, wheezing, and chest tightness.  She reports that she experiences nocturnal awakenings due to lower respiratory symptoms almost every night and asthma symptoms frequently throughout the day.  She denies fevers, chills, and discolored mucus production.  She requests prednisone today.  She has been receiving aeroallergen immunotherapy injections and Xolair injections without problems or complications.  Assessment and plan: Asthma with acute exacerbation  Prednisone has been provided, 40 mg x3 days, 20 mg x3 day, 10 mg x3 day, then stop.  Continue omalizumab injections monthly, Symbicort 160/4.5 g 2 inhalations via spacer device twice a day, Spiriva Respimat 1.25 g, 2 inhalations daily, montelukast 10 mg daily, and albuterol every 4-6 hours as needed.  The patient has been asked to contact me if her symptoms persist or progress. Otherwise, she may return for follow up in 4 months.  Acute sinusitis  Prednisone has been provided (as above).  I have recommended increasing the use of azelastine nasal spray and fluticasone nasal spray to twice daily for now.  Nasal saline lavage (NeilMed) has been recommended as needed and prior to medicated nasal sprays along with instructions for proper administration.  For thick post nasal drainage, add guaifenesin [phone] mg (Mucinex)  twice daily  as needed with adequate hydration as discussed.  Allergic rhinitis  Continue appropriate allergen avoidance measures and immunotherapy injections as prescribed.  Treatment plan as outlined above for acute sinusitis.   Meds ordered this encounter  Medications  . predniSONE (DELTASONE) tablet 10 mg  . Azelastine HCl 0.15 % SOLN    Sig: Place 2 sprays into both nostrils 2 (two) times daily.    Dispense:  30 mL    Refill:  5  . budesonide-formoterol (SYMBICORT) 160-4.5 MCG/ACT inhaler    Sig: Inhale 2 puffs into the lungs 2 (two) times daily.    Dispense:  1 Inhaler    Refill:  5  . fluticasone (FLONASE) 50 MCG/ACT nasal spray    Sig: Place 1-2 sprays into both nostrils daily as needed for allergies or rhinitis.    Dispense:  16 g    Refill:  5    Diagnostics: Spirometry: FVC was 2.59 L and FEV1 was 2.27 L (95% predicted).  Please see scanned spirometry results for details.    Physical examination: Blood pressure 126/82, pulse 89, resp. rate 18, height 5\' 3"  (1.6 m), weight 230 lb 9.6 oz (104.6 kg), SpO2 94 %.  General: Alert, interactive, in no acute distress. HEENT: TMs pearly gray, turbinates edematous with thick discharge, post-pharynx erythematous. Neck: Supple without lymphadenopathy. Lungs: Clear to auscultation without wheezing, rhonchi or rales. CV: Normal S1, S2 without murmurs. Skin: Warm and dry, without lesions or rashes.  The following portions of the patient's history were reviewed and updated as appropriate: allergies, current medications, past family history, past medical history, past social history, past surgical history and problem list.  Allergies as of 06/21/2018  Reactions   Advair Diskus [fluticasone-salmeterol]    Per patient, she thinks she developed walking PNA.    Avelox [moxifloxacin Hcl In Nacl] Other (See Comments)   Muscle Aches   Levofloxacin Other (See Comments)   Tendon pain   Protonix [pantoprazole Sodium] Diarrhea      Medication  List       Accurate as of June 21, 2018 12:49 PM. Always use your most recent med list.        albuterol (2.5 MG/3ML) 0.083% nebulizer solution Commonly known as:  PROVENTIL USE 1 VIAL IN NEBULIZER EVERY 4 HOURS AS NEEDED FOR WHEEZING OR  SHORTNESS  OF  BREATH   albuterol 108 (90 Base) MCG/ACT inhaler Commonly known as:  ProAir HFA Inhale 2 puffs into the lungs every 4 (four) hours as needed for wheezing or shortness of breath.   Azelastine HCl 0.15 % Soln Place 2 sprays into the nose daily.   Azelastine HCl 0.15 % Soln Place 2 sprays into both nostrils 2 (two) times daily.   bimatoprost 0.03 % ophthalmic solution Commonly known as:  LUMIGAN Place 1 drop into both eyes at bedtime.   budesonide-formoterol 160-4.5 MCG/ACT inhaler Commonly known as:  SYMBICORT Inhale 2 puffs into the lungs 2 (two) times daily.   calcium-vitamin D 500-200 MG-UNIT tablet Commonly known as:  OSCAL WITH D Take 1 tablet by mouth daily with breakfast.   diazepam 5 MG tablet Commonly known as:  VALIUM Take 5 mg by mouth every 6 (six) hours as needed for anxiety.   EPINEPHrine 0.3 mg/0.3 mL Soaj injection Commonly known as:  EpiPen 2-Pak USE AS DIRECTED FOR SEVERE ALLERGIC REACTION   ferrous sulfate 325 (65 FE) MG tablet Take 1 tablet (325 mg total) by mouth 3 (three) times daily after meals.   fexofenadine 180 MG tablet Commonly known as:  ALLEGRA Take 180 mg by mouth daily.   fluticasone 50 MCG/ACT nasal spray Commonly known as:  FLONASE Place 2 sprays into both nostrils daily.   fluticasone 50 MCG/ACT nasal spray Commonly known as:  FLONASE Place 1-2 sprays into both nostrils daily as needed for allergies or rhinitis.   furosemide 20 MG tablet Commonly known as:  LASIX Take 20 mg by mouth every morning.   glucosamine-chondroitin 500-400 MG tablet Take 1 tablet by mouth 2 (two) times daily.   lisinopril 40 MG tablet Commonly known as:  PRINIVIL,ZESTRIL Take 40 mg by mouth at  bedtime.   montelukast 10 MG tablet Commonly known as:  SINGULAIR Take 1 tablet (10 mg total) by mouth at bedtime.   multivitamin with minerals Tabs tablet Take 1 tablet by mouth daily.   OMEGA 3 PO Take 1 capsule by mouth daily.   omeprazole 20 MG capsule Commonly known as:  PRILOSEC Take 1 capsule (20 mg total) by mouth daily.   PRESCRIPTION MEDICATION once a week.   Spiriva Respimat 1.25 MCG/ACT Aers Generic drug:  Tiotropium Bromide Monohydrate INHALE 2 PUFFS INTO THE LUNGS DAILY.   tiotropium 18 MCG inhalation capsule Commonly known as:  SPIRIVA INHALE 1 PUFF BY MOUTH EVERY MORNING TO PREVENT COUGH OR WHEEZE   vitamin C 500 MG tablet Commonly known as:  ASCORBIC ACID Take 500 mg by mouth daily.   Xolair 150 MG injection Generic drug:  omalizumab INJECT 300 MG SUBCUTANEOUSLY EVERY 4 WEEKS -REFRIGERATE DO NOT FREEZE       Allergies  Allergen Reactions  . Advair Diskus [Fluticasone-Salmeterol]     Per patient, she thinks  she developed walking PNA.   Marland Kitchen Avelox [Moxifloxacin Hcl In Nacl] Other (See Comments)    Muscle Aches  . Levofloxacin Other (See Comments)    Tendon pain  . Protonix [Pantoprazole Sodium] Diarrhea    Review of systems: Review of systems negative except as noted in HPI / PMHx or noted below: Constitutional: Negative.  HENT: Negative.   Eyes: Negative.  Respiratory: Negative.   Cardiovascular: Negative.  Gastrointestinal: Negative.  Genitourinary: Negative.  Musculoskeletal: Negative.  Neurological: Negative.  Endo/Heme/Allergies: Negative.  Cutaneous: Negative.  Past Medical History:  Diagnosis Date  . Arthritis   . Asthma   . GERD (gastroesophageal reflux disease)   . Heart murmur    as a child   . Hypertension     Family History  Problem Relation Age of Onset  . Heart disease Father   . Allergic rhinitis Neg Hx   . Angioedema Neg Hx   . Asthma Neg Hx   . Eczema Neg Hx   . Immunodeficiency Neg Hx   . Urticaria Neg Hx      Social History   Socioeconomic History  . Marital status: Married    Spouse name: Not on file  . Number of children: Not on file  . Years of education: Not on file  . Highest education level: Not on file  Occupational History  . Not on file  Social Needs  . Financial resource strain: Not on file  . Food insecurity:    Worry: Not on file    Inability: Not on file  . Transportation needs:    Medical: Not on file    Non-medical: Not on file  Tobacco Use  . Smoking status: Never Smoker  . Smokeless tobacco: Never Used  Substance and Sexual Activity  . Alcohol use: No  . Drug use: No  . Sexual activity: Not on file  Lifestyle  . Physical activity:    Days per week: Not on file    Minutes per session: Not on file  . Stress: Not on file  Relationships  . Social connections:    Talks on phone: Not on file    Gets together: Not on file    Attends religious service: Not on file    Active member of club or organization: Not on file    Attends meetings of clubs or organizations: Not on file    Relationship status: Not on file  . Intimate partner violence:    Fear of current or ex partner: Not on file    Emotionally abused: Not on file    Physically abused: Not on file    Forced sexual activity: Not on file  Other Topics Concern  . Not on file  Social History Narrative  . Not on file    I appreciate the opportunity to take part in [place] care. Please do not hesitate to contact me with questions.  Sincerely,   R. Edgar Frisk, MD

## 2018-06-22 DIAGNOSIS — J4540 Moderate persistent asthma, uncomplicated: Secondary | ICD-10-CM | POA: Diagnosis not present

## 2018-06-23 ENCOUNTER — Ambulatory Visit (INDEPENDENT_AMBULATORY_CARE_PROVIDER_SITE_OTHER): Payer: BLUE CROSS/BLUE SHIELD | Admitting: *Deleted

## 2018-06-23 DIAGNOSIS — J4540 Moderate persistent asthma, uncomplicated: Secondary | ICD-10-CM | POA: Diagnosis not present

## 2018-06-28 ENCOUNTER — Other Ambulatory Visit: Payer: Self-pay

## 2018-06-28 ENCOUNTER — Ambulatory Visit (INDEPENDENT_AMBULATORY_CARE_PROVIDER_SITE_OTHER): Payer: BLUE CROSS/BLUE SHIELD | Admitting: *Deleted

## 2018-06-28 ENCOUNTER — Telehealth: Payer: Self-pay | Admitting: *Deleted

## 2018-06-28 DIAGNOSIS — J309 Allergic rhinitis, unspecified: Secondary | ICD-10-CM | POA: Diagnosis not present

## 2018-06-28 MED ORDER — CLARITHROMYCIN 500 MG PO TABS: 500.0000 mg | ORAL_TABLET | Freq: Two times a day (BID) | ORAL | 0 refills | Status: AC

## 2018-06-28 NOTE — Telephone Encounter (Signed)
rx sent in called patient and advised

## 2018-06-28 NOTE — Telephone Encounter (Signed)
-----   Message from Adelina Mings, MD sent at 06/28/2018  2:42 PM EDT ----- Please call in clarithromycin 500 mg twice daily x14 [facility].  Please encourage the patient to continue Mucinex, nasal saline lavage, etc.  Thanks.

## 2018-07-05 ENCOUNTER — Ambulatory Visit (INDEPENDENT_AMBULATORY_CARE_PROVIDER_SITE_OTHER): Payer: BLUE CROSS/BLUE SHIELD

## 2018-07-05 ENCOUNTER — Telehealth: Payer: Self-pay | Admitting: Internal Medicine

## 2018-07-05 DIAGNOSIS — J309 Allergic rhinitis, unspecified: Secondary | ICD-10-CM | POA: Diagnosis not present

## 2018-07-05 NOTE — Telephone Encounter (Signed)
Received urgent staff message from Kandra Nicolas needing to know if pt's CT which is currently scheduled for 4/10 is urgent or if it can be rescheduled.  Pt also has an OV scheduled 4/14.  MR, please advise on this. Thanks!

## 2018-07-06 NOTE — Telephone Encounter (Signed)
[place] she needs a CT by mid-aug 2020 for 55mm nodule. So, we can do this in July 2020. Reason for move  - pandemic response

## 2018-07-06 NOTE — Telephone Encounter (Signed)
Called and spoke with patient regarding MR recommendations below Messaged sent to [facility] in [place] chest imagining with [facility] to cancel CT for 07/23/18 She expressed and verbalized understanding Pt agreed that she will call our office in June 2020 to schedule CT and ov with MR for July 2020 Pt expressed understanding and verbalized understanding, no further concerns Nothing further needed

## 2018-07-12 ENCOUNTER — Ambulatory Visit (INDEPENDENT_AMBULATORY_CARE_PROVIDER_SITE_OTHER): Payer: BLUE CROSS/BLUE SHIELD

## 2018-07-12 DIAGNOSIS — J309 Allergic rhinitis, unspecified: Secondary | ICD-10-CM | POA: Diagnosis not present

## 2018-07-19 ENCOUNTER — Ambulatory Visit (INDEPENDENT_AMBULATORY_CARE_PROVIDER_SITE_OTHER): Payer: BLUE CROSS/BLUE SHIELD

## 2018-07-19 DIAGNOSIS — J309 Allergic rhinitis, unspecified: Secondary | ICD-10-CM

## 2018-07-20 DIAGNOSIS — J4540 Moderate persistent asthma, uncomplicated: Secondary | ICD-10-CM | POA: Diagnosis not present

## 2018-07-21 ENCOUNTER — Ambulatory Visit (INDEPENDENT_AMBULATORY_CARE_PROVIDER_SITE_OTHER): Payer: BLUE CROSS/BLUE SHIELD | Admitting: *Deleted

## 2018-07-21 DIAGNOSIS — J4540 Moderate persistent asthma, uncomplicated: Secondary | ICD-10-CM | POA: Diagnosis not present

## 2018-07-23 ENCOUNTER — Other Ambulatory Visit: Payer: BLUE CROSS/BLUE SHIELD

## 2018-07-26 ENCOUNTER — Ambulatory Visit (INDEPENDENT_AMBULATORY_CARE_PROVIDER_SITE_OTHER): Payer: BLUE CROSS/BLUE SHIELD

## 2018-07-26 DIAGNOSIS — J309 Allergic rhinitis, unspecified: Secondary | ICD-10-CM | POA: Diagnosis not present

## 2018-07-27 ENCOUNTER — Ambulatory Visit: Payer: BLUE CROSS/BLUE SHIELD | Admitting: Internal Medicine

## 2018-07-30 ENCOUNTER — Other Ambulatory Visit: Payer: Self-pay | Admitting: Allergy and Immunology

## 2018-08-02 ENCOUNTER — Ambulatory Visit (INDEPENDENT_AMBULATORY_CARE_PROVIDER_SITE_OTHER): Payer: BLUE CROSS/BLUE SHIELD

## 2018-08-02 DIAGNOSIS — J309 Allergic rhinitis, unspecified: Secondary | ICD-10-CM

## 2018-08-09 ENCOUNTER — Ambulatory Visit (INDEPENDENT_AMBULATORY_CARE_PROVIDER_SITE_OTHER): Payer: BLUE CROSS/BLUE SHIELD | Admitting: *Deleted

## 2018-08-09 DIAGNOSIS — J309 Allergic rhinitis, unspecified: Secondary | ICD-10-CM | POA: Diagnosis not present

## 2018-08-16 ENCOUNTER — Ambulatory Visit (INDEPENDENT_AMBULATORY_CARE_PROVIDER_SITE_OTHER): Payer: BLUE CROSS/BLUE SHIELD

## 2018-08-16 DIAGNOSIS — J309 Allergic rhinitis, unspecified: Secondary | ICD-10-CM

## 2018-08-17 DIAGNOSIS — J4540 Moderate persistent asthma, uncomplicated: Secondary | ICD-10-CM | POA: Diagnosis not present

## 2018-08-18 ENCOUNTER — Other Ambulatory Visit: Payer: Self-pay | Admitting: Allergy and Immunology

## 2018-08-18 ENCOUNTER — Ambulatory Visit (INDEPENDENT_AMBULATORY_CARE_PROVIDER_SITE_OTHER): Payer: BLUE CROSS/BLUE SHIELD | Admitting: *Deleted

## 2018-08-18 ENCOUNTER — Other Ambulatory Visit: Payer: Self-pay

## 2018-08-18 DIAGNOSIS — J4540 Moderate persistent asthma, uncomplicated: Secondary | ICD-10-CM

## 2018-08-18 DIAGNOSIS — J4550 Severe persistent asthma, uncomplicated: Secondary | ICD-10-CM

## 2018-08-18 MED ORDER — EPINEPHRINE 0.3 MG/0.3ML IJ SOAJ: INTRAMUSCULAR | 1 refills | Status: DC

## 2018-08-18 NOTE — Telephone Encounter (Signed)
Prescription refill has been sent in as requested.

## 2018-08-18 NOTE — Telephone Encounter (Signed)
Patient needs a refill on EPI-PEN [facility] [place] Patient's has expired in Dec

## 2018-08-23 ENCOUNTER — Ambulatory Visit (INDEPENDENT_AMBULATORY_CARE_PROVIDER_SITE_OTHER): Payer: BLUE CROSS/BLUE SHIELD

## 2018-08-23 DIAGNOSIS — J309 Allergic rhinitis, unspecified: Secondary | ICD-10-CM

## 2018-08-25 ENCOUNTER — Telehealth: Payer: Self-pay

## 2018-08-25 NOTE — Progress Notes (Signed)
VIAL EXP 08-25-2019 

## 2018-08-25 NOTE — Telephone Encounter (Signed)
Pt called about Epi-pen hasn't been filled because the pharmacy states they need information from the insurance company and they send correspondence to [facility] to contact the insurance, I haven't seen anything, called pharmacy to ask for them to send info again [phone]

## 2018-08-26 DIAGNOSIS — J3089 Other allergic rhinitis: Secondary | ICD-10-CM | POA: Diagnosis not present

## 2018-08-30 ENCOUNTER — Ambulatory Visit (INDEPENDENT_AMBULATORY_CARE_PROVIDER_SITE_OTHER): Payer: BLUE CROSS/BLUE SHIELD

## 2018-08-30 DIAGNOSIS — J309 Allergic rhinitis, unspecified: Secondary | ICD-10-CM | POA: Diagnosis not present

## 2018-08-31 ENCOUNTER — Telehealth: Payer: Self-pay | Admitting: Allergy and Immunology

## 2018-08-31 MED ORDER — EPINEPHRINE 0.3 MG/0.3ML IJ SOAJ: 0.3000 mg | Freq: Once | INTRAMUSCULAR | 2 refills | Status: AC

## 2018-08-31 NOTE — Telephone Encounter (Signed)
A new prescription has been sent in asking to dispense generic brand. Called patient and informed. Patient verbalized understanding.

## 2018-08-31 NOTE — Telephone Encounter (Signed)
Patient states that she cant get and EPI-PEN that insurance will pay for a generic version Please call in new script to [place]

## 2018-09-03 DIAGNOSIS — I10 Essential (primary) hypertension: Secondary | ICD-10-CM | POA: Diagnosis not present

## 2018-09-03 DIAGNOSIS — F419 Anxiety disorder, unspecified: Secondary | ICD-10-CM | POA: Diagnosis not present

## 2018-09-03 DIAGNOSIS — R600 Localized edema: Secondary | ICD-10-CM | POA: Diagnosis not present

## 2018-09-03 DIAGNOSIS — K219 Gastro-esophageal reflux disease without esophagitis: Secondary | ICD-10-CM | POA: Diagnosis not present

## 2018-09-07 ENCOUNTER — Ambulatory Visit (INDEPENDENT_AMBULATORY_CARE_PROVIDER_SITE_OTHER): Payer: BLUE CROSS/BLUE SHIELD | Admitting: *Deleted

## 2018-09-07 DIAGNOSIS — J309 Allergic rhinitis, unspecified: Secondary | ICD-10-CM | POA: Diagnosis not present

## 2018-09-13 ENCOUNTER — Ambulatory Visit (INDEPENDENT_AMBULATORY_CARE_PROVIDER_SITE_OTHER): Payer: BLUE CROSS/BLUE SHIELD

## 2018-09-13 DIAGNOSIS — J309 Allergic rhinitis, unspecified: Secondary | ICD-10-CM | POA: Diagnosis not present

## 2018-09-14 DIAGNOSIS — J4540 Moderate persistent asthma, uncomplicated: Secondary | ICD-10-CM | POA: Diagnosis not present

## 2018-09-15 ENCOUNTER — Ambulatory Visit (INDEPENDENT_AMBULATORY_CARE_PROVIDER_SITE_OTHER): Payer: BC Managed Care – PPO

## 2018-09-15 ENCOUNTER — Other Ambulatory Visit: Payer: Self-pay

## 2018-09-15 DIAGNOSIS — J4540 Moderate persistent asthma, uncomplicated: Secondary | ICD-10-CM

## 2018-09-20 ENCOUNTER — Ambulatory Visit (INDEPENDENT_AMBULATORY_CARE_PROVIDER_SITE_OTHER): Payer: BC Managed Care – PPO

## 2018-09-20 DIAGNOSIS — J309 Allergic rhinitis, unspecified: Secondary | ICD-10-CM

## 2018-09-27 ENCOUNTER — Ambulatory Visit (INDEPENDENT_AMBULATORY_CARE_PROVIDER_SITE_OTHER): Payer: BC Managed Care – PPO | Admitting: *Deleted

## 2018-09-27 DIAGNOSIS — J309 Allergic rhinitis, unspecified: Secondary | ICD-10-CM | POA: Diagnosis not present

## 2018-10-04 ENCOUNTER — Ambulatory Visit (INDEPENDENT_AMBULATORY_CARE_PROVIDER_SITE_OTHER): Payer: BC Managed Care – PPO

## 2018-10-04 DIAGNOSIS — J309 Allergic rhinitis, unspecified: Secondary | ICD-10-CM

## 2018-10-11 ENCOUNTER — Ambulatory Visit (INDEPENDENT_AMBULATORY_CARE_PROVIDER_SITE_OTHER): Payer: BC Managed Care – PPO | Admitting: *Deleted

## 2018-10-11 DIAGNOSIS — J309 Allergic rhinitis, unspecified: Secondary | ICD-10-CM

## 2018-10-12 DIAGNOSIS — J4540 Moderate persistent asthma, uncomplicated: Secondary | ICD-10-CM | POA: Diagnosis not present

## 2018-10-13 ENCOUNTER — Other Ambulatory Visit: Payer: Self-pay

## 2018-10-13 ENCOUNTER — Ambulatory Visit (INDEPENDENT_AMBULATORY_CARE_PROVIDER_SITE_OTHER): Payer: BC Managed Care – PPO | Admitting: *Deleted

## 2018-10-13 DIAGNOSIS — J4540 Moderate persistent asthma, uncomplicated: Secondary | ICD-10-CM

## 2018-10-15 DIAGNOSIS — L03115 Cellulitis of right lower limb: Secondary | ICD-10-CM | POA: Diagnosis not present

## 2018-10-18 ENCOUNTER — Telehealth: Payer: Self-pay | Admitting: *Deleted

## 2018-10-18 NOTE — Telephone Encounter (Signed)

## 2018-10-19 ENCOUNTER — Other Ambulatory Visit: Payer: Self-pay

## 2018-10-19 ENCOUNTER — Ambulatory Visit (INDEPENDENT_AMBULATORY_CARE_PROVIDER_SITE_OTHER)
Admission: RE | Admit: 2018-10-19 | Discharge: 2018-10-19 | Disposition: A | Discharge status: Home / Self Care | Payer: BC Managed Care – PPO | Source: Ambulatory Visit | Attending: Internal Medicine | Admitting: Internal Medicine

## 2018-10-19 DIAGNOSIS — R911 Solitary pulmonary nodule: Secondary | ICD-10-CM | POA: Diagnosis not present

## 2018-10-19 DIAGNOSIS — R918 Other nonspecific abnormal finding of lung field: Secondary | ICD-10-CM | POA: Diagnosis not present

## 2018-10-21 ENCOUNTER — Telehealth: Payer: Self-pay | Admitting: Internal Medicine

## 2018-10-21 NOTE — Telephone Encounter (Signed)
Lung nodules were stable, but she will need a repeat CT scheduled in 1 year. She needs a follow up with Dr. Chase Caller scheduled at his first available. Thanks.

## 2018-10-21 NOTE — Telephone Encounter (Signed)
Spoke with pt. She is requesting her CT results from 10/19/2018.  Tonya - please advise. Thanks.

## 2018-10-21 NOTE — Telephone Encounter (Signed)
Spoke with pt. She is aware of results. Pt will call back to make an appointment with MR as he does not have a schedule at this time. Nothing further was needed.

## 2018-10-22 ENCOUNTER — Ambulatory Visit (INDEPENDENT_AMBULATORY_CARE_PROVIDER_SITE_OTHER): Payer: BC Managed Care – PPO | Admitting: *Deleted

## 2018-10-22 DIAGNOSIS — J309 Allergic rhinitis, unspecified: Secondary | ICD-10-CM | POA: Diagnosis not present

## 2018-10-27 ENCOUNTER — Ambulatory Visit (INDEPENDENT_AMBULATORY_CARE_PROVIDER_SITE_OTHER): Payer: BC Managed Care – PPO | Admitting: *Deleted

## 2018-10-27 DIAGNOSIS — J309 Allergic rhinitis, unspecified: Secondary | ICD-10-CM | POA: Diagnosis not present

## 2018-11-01 ENCOUNTER — Other Ambulatory Visit: Payer: Self-pay

## 2018-11-01 ENCOUNTER — Encounter: Payer: Self-pay | Admitting: Allergy and Immunology

## 2018-11-01 ENCOUNTER — Ambulatory Visit: Payer: BLUE CROSS/BLUE SHIELD | Admitting: Allergy and Immunology

## 2018-11-01 VITALS — BP 130/76 | HR 93 | Resp 16 | Ht 63.0 in | Wt 219.0 lb

## 2018-11-01 DIAGNOSIS — J3089 Other allergic rhinitis: Secondary | ICD-10-CM | POA: Diagnosis not present

## 2018-11-01 DIAGNOSIS — K219 Gastro-esophageal reflux disease without esophagitis: Secondary | ICD-10-CM | POA: Diagnosis not present

## 2018-11-01 DIAGNOSIS — J4550 Severe persistent asthma, uncomplicated: Secondary | ICD-10-CM | POA: Diagnosis not present

## 2018-11-01 DIAGNOSIS — J309 Allergic rhinitis, unspecified: Secondary | ICD-10-CM | POA: Diagnosis not present

## 2018-11-01 NOTE — Progress Notes (Signed)
Follow-up Note  RE: Melanie Armstrong MRN: [id number] DOB: 1956-03-12 Date of Office Visit: 11/01/2018  Primary care provider: [name], MD Referring provider: [name], MD  History of present illness: Melanie Armstrong is a 63 y.o. female with persistent asthma, allergic rhinoconjunctivitis, and gastroesophageal reflux presents today for follow-up.  She was last seen in this clinic on June 21, 2018.  She was treated for acute sinusitis and asthma exacerbation at that time.  Her symptoms resolved with oral steroids and antibiotics.  Since that time, her upper and lower respiratory symptoms have been relatively well controlled.  She experiences asthma symptoms 2 times per week on average.  She reports that social distancing has been beneficial to her because she is not exposed to asthma triggers as much, such as perfumes and colognes from other people. She has been compliant with her asthma medications, including omalizumab injections, Symbicort 160/4.5 micro grams, 2 inhalations via spacer device twice daily, Spiriva 1.25 g 2 inhalations daily, and montelukast 10 mg daily. Her nasal allergy symptoms have been well controlled with allergy medications as needed.  She reports that her reflux has been well controlled with omeprazole daily.  Assessment and plan: Severe persistent asthma Stable.  Continue omalizumab injections monthly, Symbicort 160/4.5 g 2 inhalations via spacer device twice a day, Spiriva Respimat 1.25 g, 2 inhalations daily, montelukast 10 mg daily, and albuterol every 4-6 hours as needed.  Subjective and objective measures of pulmonary function will be followed and the treatment plan will be adjusted accordingly.  Allergic rhinitis  Continue appropriate allergen avoidance measures, immunotherapy injections as prescribed, azelastine nasal spray as needed, fluticasone nasal spray as needed, and guaifenesin if needed.  Nasal saline spray (i.e., Simply Saline) or  nasal saline lavage (i.e., NeilMed) is recommended as needed and prior to medicated nasal sprays.  GERD (gastroesophageal reflux disease)  Continue reflux lifestyle modifications and omeprazole as prescribed.   Diagnostics: Spirometry:  Normal with an FEV1 of 105% predicted. This study was performed while the patient was asymptomatic.  Please see scanned spirometry results for details.    Physical examination: Blood pressure 130/76, pulse 93, resp. rate 16, height 5\' 3"  (1.6 m), weight 219 lb (99.3 kg), SpO2 96 %.  General: Alert, interactive, in no acute distress. HEENT: TMs pearly gray, turbinates minimally edematous without discharge, post-pharynx unremarkable. Neck: Supple without lymphadenopathy. Lungs: Clear to auscultation without wheezing, rhonchi or rales. CV: Normal S1, S2 without murmurs. Skin: Warm and dry, without lesions or rashes.  The following portions of the patient's history were reviewed and updated as appropriate: allergies, current medications, past family history, past medical history, past social history, past surgical history and problem list.   Allergies as of 11/01/2018      Reactions   Advair Diskus [fluticasone-salmeterol]    Per patient, she thinks she developed walking PNA.    Avelox [moxifloxacin Hcl In Nacl] Other (See Comments)   Muscle Aches   Levofloxacin Other (See Comments)   Tendon pain   Protonix [pantoprazole Sodium] Diarrhea      Medication List       Accurate as of November 01, 2018 11:51 AM. If you have any questions, ask your nurse or doctor.        albuterol (2.5 MG/3ML) 0.083% nebulizer solution Commonly known as: PROVENTIL USE 1 VIAL IN NEBULIZER EVERY 4 HOURS AS NEEDED FOR WHEEZING OR  SHORTNESS  OF  BREATH   albuterol 108 (90 Base) MCG/ACT inhaler Commonly known as: ProAir HFA  Inhale 2 puffs into the lungs every 4 (four) hours as needed for wheezing or shortness of breath.   Azelastine HCl 0.15 % Soln Place 2 sprays into  the nose daily.   Azelastine HCl 0.15 % Soln Place 2 sprays into both nostrils 2 (two) times daily.   bimatoprost 0.03 % ophthalmic solution Commonly known as: LUMIGAN Place 1 drop into both eyes at bedtime.   Lumigan 0.01 % Soln Generic drug: bimatoprost INSTILL 1 DROP INTO BOTH EYES EVERY DAY IN THE EVENING   budesonide-formoterol 160-4.5 MCG/ACT inhaler Commonly known as: SYMBICORT Inhale 2 puffs into the lungs 2 (two) times daily.   calcium-vitamin D 500-200 MG-UNIT tablet Commonly known as: OSCAL WITH D Take 1 tablet by mouth daily with breakfast.   diazepam 5 MG tablet Commonly known as: VALIUM Take 5 mg by mouth every 6 (six) hours as needed for anxiety.   EPINEPHrine 0.3 mg/0.3 mL Soaj injection Commonly known as: EpiPen 2-Pak USE AS DIRECTED FOR SEVERE ALLERGIC REACTION   ferrous sulfate 325 (65 FE) MG tablet Take 1 tablet (325 mg total) by mouth 3 (three) times daily after meals. What changed: when to take this   fexofenadine 180 MG tablet Commonly known as: ALLEGRA Take 180 mg by mouth daily.   fluticasone 50 MCG/ACT nasal spray Commonly known as: FLONASE Place 2 sprays into both nostrils daily.   fluticasone 50 MCG/ACT nasal spray Commonly known as: FLONASE Place 1-2 sprays into both nostrils daily as needed for allergies or rhinitis.   furosemide 20 MG tablet Commonly known as: LASIX Take 20 mg by mouth every morning.   glucosamine-chondroitin 500-400 MG tablet Take 1 tablet by mouth 2 (two) times daily.   lisinopril 40 MG tablet Commonly known as: ZESTRIL Take 40 mg by mouth at bedtime.   lisinopril 20 MG tablet Commonly known as: ZESTRIL TAKE 2 TABLETS BY MOUTH ONCE DAILY FOR 90 DAYS   montelukast 10 MG tablet Commonly known as: SINGULAIR Take 1 tablet (10 mg total) by mouth at bedtime.   multivitamin with minerals Tabs tablet Take 1 tablet by mouth daily.   OMEGA 3 PO Take 1 capsule by mouth daily.   omeprazole 20 MG capsule  Commonly known as: PRILOSEC Take 1 capsule (20 mg total) by mouth daily.   PRESCRIPTION MEDICATION once a week.   Spiriva Respimat 1.25 MCG/ACT Aers Generic drug: Tiotropium Bromide Monohydrate INHALE 2 PUFFS INTO THE LUNGS DAILY.   tiotropium 18 MCG inhalation capsule Commonly known as: Spiriva HandiHaler INHALE 1 PUFF BY MOUTH EVERY MORNING TO PREVENT COUGH OR WHEEZE   vitamin C 500 MG tablet Commonly known as: ASCORBIC ACID Take 500 mg by mouth daily.   Xolair 150 MG injection Generic drug: omalizumab INJECT 300 MG SUBCUTANEOUSLY EVERY 4 WEEKS -REFRIGERATE DO NOT FREEZE       Allergies  Allergen Reactions  . Advair Diskus [Fluticasone-Salmeterol]     Per patient, she thinks she developed walking PNA.   Marland Kitchen Avelox [Moxifloxacin Hcl In Nacl] Other (See Comments)    Muscle Aches  . Levofloxacin Other (See Comments)    Tendon pain  . Protonix [Pantoprazole Sodium] Diarrhea    Review of systems: Review of systems negative except as noted in HPI / PMHx or noted below: Constitutional: Negative.  HENT: Negative.   Eyes: Negative.  Respiratory: Negative.   Cardiovascular: Negative.  Gastrointestinal: Negative.  Genitourinary: Negative.  Musculoskeletal: Negative.  Neurological: Negative.  Endo/Heme/Allergies: Negative.  Cutaneous: Negative.  Past Medical History:  Diagnosis Date  . Arthritis   . Asthma   . GERD (gastroesophageal reflux disease)   . Heart murmur    as a child   . Hypertension     Family History  Problem Relation Age of Onset  . Heart disease Father   . Allergic rhinitis Neg Hx   . Angioedema Neg Hx   . Asthma Neg Hx   . Eczema Neg Hx   . Immunodeficiency Neg Hx   . Urticaria Neg Hx     Social History   Socioeconomic History  . Marital status: Married    Spouse name: Not on file  . Number of children: Not on file  . Years of education: Not on file  . Highest education level: Not on file  Occupational History  . Not on file   Social Needs  . Financial resource strain: Not on file  . Food insecurity    Worry: Not on file    Inability: Not on file  . Transportation needs    Medical: Not on file    Non-medical: Not on file  Tobacco Use  . Smoking status: Never Smoker  . Smokeless tobacco: Never Used  Substance and Sexual Activity  . Alcohol use: No  . Drug use: No  . Sexual activity: Not on file  Lifestyle  . Physical activity    Days per week: Not on file    Minutes per session: Not on file  . Stress: Not on file  Relationships  . Social Herbalist on phone: Not on file    Gets together: Not on file    Attends religious service: Not on file    Active member of club or organization: Not on file    Attends meetings of clubs or organizations: Not on file    Relationship status: Not on file  . Intimate partner violence    Fear of current or ex partner: Not on file    Emotionally abused: Not on file    Physically abused: Not on file    Forced sexual activity: Not on file  Other Topics Concern  . Not on file  Social History Narrative  . Not on file    I appreciate the opportunity to take part in [employer] care. Please do not hesitate to contact me with questions.  Sincerely,   R. Edgar Frisk, MD

## 2018-11-01 NOTE — Assessment & Plan Note (Signed)
Stable.  Continue omalizumab injections monthly, Symbicort 160/4.5 g 2 inhalations via spacer device twice a day, Spiriva Respimat 1.25 g, 2 inhalations daily, montelukast 10 mg daily, and albuterol every 4-6 hours as needed.  Subjective and objective measures of pulmonary function will be followed and the treatment plan will be adjusted accordingly.

## 2018-11-01 NOTE — Assessment & Plan Note (Signed)
   Continue reflux lifestyle modifications and omeprazole as prescribed. 

## 2018-11-01 NOTE — Assessment & Plan Note (Addendum)
   Continue appropriate allergen avoidance measures, immunotherapy injections as prescribed, azelastine nasal spray as needed, fluticasone nasal spray as needed, and guaifenesin if needed.  Nasal saline spray (i.e., Simply Saline) or nasal saline lavage (i.e., NeilMed) is recommended as needed and prior to medicated nasal sprays.

## 2018-11-01 NOTE — Patient Instructions (Addendum)
Severe persistent asthma Stable.  Continue omalizumab injections monthly, Symbicort 160/4.5 g 2 inhalations via spacer device twice a day, Spiriva Respimat 1.25 g, 2 inhalations daily, montelukast 10 mg daily, and albuterol every 4-6 hours as needed.  Subjective and objective measures of pulmonary function will be followed and the treatment plan will be adjusted accordingly.  Allergic rhinitis  Continue appropriate allergen avoidance measures, immunotherapy injections as prescribed, azelastine nasal spray as needed, fluticasone nasal spray as needed, and guaifenesin if needed.  Nasal saline spray (i.e., Simply Saline) or nasal saline lavage (i.e., NeilMed) is recommended as needed and prior to medicated nasal sprays.  GERD (gastroesophageal reflux disease)  Continue reflux lifestyle modifications and omeprazole as prescribed.   Return in about 4 months (around 03/04/2019), or if symptoms worsen or fail to improve.

## 2018-11-03 DIAGNOSIS — J3089 Other allergic rhinitis: Secondary | ICD-10-CM | POA: Diagnosis not present

## 2018-11-03 NOTE — Progress Notes (Signed)
VIAL EXP 11-03-2019

## 2018-11-08 ENCOUNTER — Ambulatory Visit (INDEPENDENT_AMBULATORY_CARE_PROVIDER_SITE_OTHER): Payer: BC Managed Care – PPO

## 2018-11-08 DIAGNOSIS — J309 Allergic rhinitis, unspecified: Secondary | ICD-10-CM | POA: Diagnosis not present

## 2018-11-09 DIAGNOSIS — J4540 Moderate persistent asthma, uncomplicated: Secondary | ICD-10-CM | POA: Diagnosis not present

## 2018-11-10 ENCOUNTER — Other Ambulatory Visit: Payer: Self-pay

## 2018-11-10 ENCOUNTER — Ambulatory Visit (INDEPENDENT_AMBULATORY_CARE_PROVIDER_SITE_OTHER): Payer: BC Managed Care – PPO

## 2018-11-10 DIAGNOSIS — J4540 Moderate persistent asthma, uncomplicated: Secondary | ICD-10-CM | POA: Diagnosis not present

## 2018-11-15 ENCOUNTER — Ambulatory Visit (INDEPENDENT_AMBULATORY_CARE_PROVIDER_SITE_OTHER): Payer: BC Managed Care – PPO | Admitting: *Deleted

## 2018-11-15 DIAGNOSIS — J309 Allergic rhinitis, unspecified: Secondary | ICD-10-CM

## 2018-11-22 ENCOUNTER — Ambulatory Visit (INDEPENDENT_AMBULATORY_CARE_PROVIDER_SITE_OTHER): Payer: BC Managed Care – PPO | Admitting: *Deleted

## 2018-11-22 DIAGNOSIS — J309 Allergic rhinitis, unspecified: Secondary | ICD-10-CM

## 2018-11-24 DIAGNOSIS — Z96641 Presence of right artificial hip joint: Secondary | ICD-10-CM | POA: Diagnosis not present

## 2018-11-24 DIAGNOSIS — M25552 Pain in left hip: Secondary | ICD-10-CM | POA: Diagnosis not present

## 2018-11-24 DIAGNOSIS — M1612 Unilateral primary osteoarthritis, left hip: Secondary | ICD-10-CM | POA: Diagnosis not present

## 2018-11-24 DIAGNOSIS — Z471 Aftercare following joint replacement surgery: Secondary | ICD-10-CM | POA: Diagnosis not present

## 2018-11-29 ENCOUNTER — Ambulatory Visit (INDEPENDENT_AMBULATORY_CARE_PROVIDER_SITE_OTHER): Payer: BC Managed Care – PPO | Admitting: *Deleted

## 2018-11-29 DIAGNOSIS — J309 Allergic rhinitis, unspecified: Secondary | ICD-10-CM | POA: Diagnosis not present

## 2018-12-06 ENCOUNTER — Ambulatory Visit (INDEPENDENT_AMBULATORY_CARE_PROVIDER_SITE_OTHER): Payer: BC Managed Care – PPO | Admitting: *Deleted

## 2018-12-06 DIAGNOSIS — J309 Allergic rhinitis, unspecified: Secondary | ICD-10-CM | POA: Diagnosis not present

## 2018-12-07 DIAGNOSIS — J4550 Severe persistent asthma, uncomplicated: Secondary | ICD-10-CM

## 2018-12-08 ENCOUNTER — Other Ambulatory Visit: Payer: Self-pay

## 2018-12-08 ENCOUNTER — Ambulatory Visit (INDEPENDENT_AMBULATORY_CARE_PROVIDER_SITE_OTHER): Payer: BC Managed Care – PPO | Admitting: *Deleted

## 2018-12-08 DIAGNOSIS — J4550 Severe persistent asthma, uncomplicated: Secondary | ICD-10-CM

## 2018-12-10 DIAGNOSIS — H40053 Ocular hypertension, bilateral: Secondary | ICD-10-CM | POA: Diagnosis not present

## 2018-12-13 ENCOUNTER — Other Ambulatory Visit: Payer: Self-pay

## 2018-12-13 ENCOUNTER — Ambulatory Visit: Payer: BC Managed Care – PPO | Admitting: Internal Medicine

## 2018-12-13 ENCOUNTER — Encounter: Payer: Self-pay | Admitting: Internal Medicine

## 2018-12-13 VITALS — BP 130/78 | HR 81 | Temp 97.2°F | Ht 63.0 in | Wt 219.6 lb

## 2018-12-13 DIAGNOSIS — Q859 Phakomatosis, unspecified: Secondary | ICD-10-CM

## 2018-12-13 DIAGNOSIS — R911 Solitary pulmonary nodule: Secondary | ICD-10-CM

## 2018-12-13 DIAGNOSIS — Z23 Encounter for immunization: Secondary | ICD-10-CM

## 2018-12-13 DIAGNOSIS — Z8709 Personal history of other diseases of the respiratory system: Secondary | ICD-10-CM | POA: Diagnosis not present

## 2018-12-13 NOTE — Patient Instructions (Addendum)
ICD-10-CM   1. Nodule of lower lobe of left lung  R91.1   2. Flu vaccine need  Z23   3. Pulmonary hamartoma ([place])  Q85.9   4. History of asthma  Z87.09      Nodule of lower lobe of left lung 34mm in CT may 10,2019 - > no change Aug 2019 -> July 2020 - low prob for malignancy  - do repeat CT chest without contrast in mid Sept 2021   Pulmonary hamartoma [facility])  - this is seen in left lung lower part and stable in CT scan May 2019 and Aug 2019 and July 2020   no active folllowup  History of asthma - per allergist - talk to PCP [name], MD and give trial off ace inhibitor for  1 month to see if it helps allergies and asthma  In need for flu shot  - flu shot 12/13/2018    Followup Sept 2021  after CT chest

## 2018-12-13 NOTE — Progress Notes (Signed)
PCP [name], MD  HPI  IOV 09/08/2017  Chief Complaint  Patient presents with  . Pulm Consult    Referred by Dr. Veverly Fells for left pulmonary nodules. Per patient, has 2 nodules on the left. Patient has a history of asthma and SOB.      Melanie Armstrong is a 63 year old non-smoker who works on a desk job doing Microbiologist.  She is here referred for left lung nodules.  The background history is that she has been on lisinopril for 15 years.  She carries a diagnosis of asthma allergic that is managed by our local allergist.  She tells me that she is been on Symbicort for at least 10 years and Spiriva for at least 5 years and the last couple of years Xolair.  She describes herself as severe persistent asthma.  Her recent spirometry May 2019 personal visualization shows no normal FEV1 and FVC.  Earlier in spring 2019 she had respiratory infection following exposure to sick contacts at work.  Since then she has had significant cough and bronchitis requiring at least 2 rounds of prednisone and antibiotics.  Things are improving and currently the cough is rated as a 3 out of 10.  She tells me as part of this persistent bronchitic cough symptoms she did have a chest x-ray that showed left lung nodule that then resulted in the CT scan of the chest Aug 20, 2017 that shows a 1.7 cm left lung nodule that is dense and I personally visualized and agree with the radiologist interpretation that this is a hamartoma.  There is another 9 mm subpleural left lower lobe posterior segment nodule and that the main reason for the referral today.  The CT scan also shows coronary artery calcification but she denies any chest pain.  She is a non-smoker.  Although when she was a kid her parents smoked.   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  Indeterminate 9 mm pulmonary nodule in posterior left lower lobe.  Consider one of the following in 3 months for both low-risk and high-risk individuals: (a) repeat chest CT, (b) follow-up PET-CT, or (c) tissue sampling. This recommendation follows the consensus statement: Guidelines for Management of Incidental Pulmonary Nodules Detected on CT Images: From the Fleischner Society 2017; Radiology 2017; 284:228-243.  Incidental findings: Aortic Atherosclerosis (ICD10-I70.0). Coronary artery calcification. Tiny hiatal hernia. Small benign right adrenal adenoma.   Electronically Signed   By: Earle Gell M.D.   On: 08/21/2017 10:50  OV 11/30/2017  Chief Complaint  Patient presents with  . Follow-up    CT 8/16.  Pt also had cards referral 8/9. Pt states she has been doing well since last visit and denies any complaints.     Follow-up lung nodules -  9 mm left lower lobe in the setting of stable 1.7 cm benign left lower lobe hamartoma  -  history of asthma followed by allergist.  Is a routine follow-up. She had a 3 month CT scan of the chest that is documented below. The 9 mm left lower lobe nodule is stable but in May 2019 and August 2019. The left lower lobe, was also stable. In terms of asthma she follows with an allergist. She is on ACE inhibitor but she says she has no cough and she does not feel a need for medication to be discontinued.   Ct Chest Wo Contrast  Result  Date: 11/27/2017 CLINICAL DATA:  Pulmonary nodule follow-up. EXAM: CT CHEST WITHOUT CONTRAST TECHNIQUE: Multidetector CT imaging of the chest was performed following the standard protocol without IV contrast. COMPARISON:  08/20/2017 FINDINGS: Cardiovascular: The heart size is normal. No substantial pericardial effusion. Coronary artery calcification is evident. Atherosclerotic calcification is noted in the wall of the thoracic aorta. Mediastinum/Nodes: No mediastinal lymphadenopathy. No evidence for gross hilar lymphadenopathy although assessment is limited by the lack of intravenous  contrast on today's study. The esophagus has normal imaging features. There is no axillary lymphadenopathy. Lungs/Pleura: The central tracheobronchial airways are patent. Scattered peripheral tiny pulmonary nodules are similar to prior. 1.7 cm nodule left lower lobe contains central fat density consistent with benign hamartoma. 9 mm peripheral left lower lobe nodule seen on the prior study persists at 9 mm today (3:109). No pleural effusion. Upper Abdomen: Stable 15 mm right adrenal adenoma. Musculoskeletal: No worrisome lytic or sclerotic osseous abnormality. IMPRESSION: 1. 9 mm left lower lobe pulmonary nodule is stable in the interval. Three-month stable imaging follow-up is reassuring. Repeat CT chest without contrast in 6-12 months recommended to ensure continued stability. 2. Stable 1.7 cm benign pulmonary hamartoma. 3. No change scattered tiny peripheral nodules in both lungs. 4.  Aortic Atherosclerois (ICD10-170.0) Electronically Signed   By: Misty Stanley M.D.   On: 11/27/2017 16:11   OV 12/13/2018  Subjective:  Patient ID: Melanie Armstrong, female , DOB: Aug 18, 1955 , age 63 y.o. , MRN: [id number] , ADDRESS: [place] [address]   12/13/2018 -   Chief Complaint  Patient presents with  . Nodule of lower lobe of left lung    Follow-up lung nodules -  9 mm left lower lobe in the setting of separate stable 1.7 cm benign left lower lobe hamartoma  - Hx of  history of asthma followed by allergist. - nasal inhalers, symbicort, spiriva, xolair - Also on ace inhibitors without cough   HPI Melanie Armstrong 63 y.o. -is now over 1 year since  last saw Ms. Linarez.  Overall she is stable and doing well.  No major issues in the interim.  She is due for a flu shot and is willing to have it today.  She had a CT scan of the chest October 19, 2018 as a follow-up to August 29 CT scan of the chest.  The left lower lobe 0.8/0.9 cm pulmonary nodule is stable.  Her hematoma is also stable.  She does have  coronary artery atherosclerosis.  She says she has seen Dr. Irish Lack and has been reassured.  She has allergic asthma and sees an outside allergist.  She is on nasal inhalers, Symbicort, Spiriva and Xolair.  She is also on ACE inhibitor lisinopril.  She states this does not cause cough or make the asthma worse    IMPRESSION: CT chest 10/19/2018 1. Irregular posterior left lower lobe 0.8 cm pulmonary nodule is stable since 08/20/2017 chest CT, more likely benign. Suggest continued follow-up chest CT in 12 months given the irregular morphology of this nodule. 2. Separate stable benign left lower lobe pulmonary hamartoma. 3. Three-vessel coronary atherosclerosis. 4. Small hiatal hernia.  Aortic Atherosclerosis (ICD10-I70.0).   Electronically Signed   By: Ilona Sorrel M.D.   On: 10/19/2018 14:09 ROS - per HPI     has a past medical history of Arthritis, Asthma, GERD (gastroesophageal reflux disease), Heart murmur, and Hypertension.   reports that she has never smoked. She has never used smokeless tobacco.  Past Surgical History:  Procedure Laterality Date  . CHOLECYSTECTOMY    . TONSILLECTOMY    . TOTAL HIP ARTHROPLASTY Right 08/22/2014   Procedure: RIGHT TOTAL HIP ARTHROPLASTY ANTERIOR APPROACH;  Surgeon: Paralee Cancel, MD;  Location: [facility];  Service: Orthopedics;  Laterality: Right;    Allergies  Allergen Reactions  . Advair Diskus [Fluticasone-Salmeterol]     Per patient, she thinks she developed walking PNA.   Marland Kitchen Avelox [Moxifloxacin Hcl In Nacl] Other (See Comments)    Muscle Aches  . Levofloxacin Other (See Comments)    Tendon pain  . Protonix [Pantoprazole Sodium] Diarrhea    Immunization History  Administered Date(s) Administered  . Influenza,inj,Quad PF,6+ Mos 01/26/2018  . Influenza-Unspecified 01/13/2012, 01/12/2014  . Tdap 11/12/2009    Family History  Problem Relation Age of Onset  . Heart disease Father   . Allergic rhinitis Neg Hx   . Angioedema Neg  Hx   . Asthma Neg Hx   . Eczema Neg Hx   . Immunodeficiency Neg Hx   . Urticaria Neg Hx      Current Outpatient Medications:  .  albuterol (PROAIR HFA) 108 (90 Base) MCG/ACT inhaler, Inhale 2 puffs into the lungs every 4 (four) hours as needed for wheezing or shortness of breath., Disp: 1 Inhaler, Rfl: 1 .  albuterol (PROVENTIL) (2.5 MG/3ML) 0.083% nebulizer solution, USE 1 VIAL IN NEBULIZER EVERY 4 HOURS AS NEEDED FOR WHEEZING OR  SHORTNESS  OF  BREATH, Disp: 600 mL, Rfl: 0 .  amoxicillin (AMOXIL) 500 MG capsule, TAKE 4 CAPSULES ONE HOUR PRIOR TO DENTAL PROCEDURE., Disp: , Rfl:  .  Azelastine HCl 0.15 % SOLN, Place 2 sprays into both nostrils 2 (two) times daily., Disp: 30 mL, Rfl: 5 .  budesonide-formoterol (SYMBICORT) 160-4.5 MCG/ACT inhaler, Inhale 2 puffs into the lungs 2 (two) times daily., Disp: 1 Inhaler, Rfl: 5 .  calcium-vitamin D (OSCAL WITH D) 500-200 MG-UNIT per tablet, Take 1 tablet by mouth daily with breakfast., Disp: , Rfl:  .  diazepam (VALIUM) 5 MG tablet, Take 5 mg by mouth every 6 (six) hours as needed for anxiety., Disp: , Rfl:  .  EPINEPHrine (EPIPEN 2-PAK) 0.3 mg/0.3 mL IJ SOAJ injection, USE AS DIRECTED FOR SEVERE ALLERGIC REACTION, Disp: 2 Device, Rfl: 1 .  ferrous sulfate 325 (65 FE) MG tablet, Take 1 tablet (325 mg total) by mouth 3 (three) times daily after meals. (Patient taking differently: Take 325 mg by mouth daily with breakfast. ), Disp: , Rfl: 3 .  fexofenadine (ALLEGRA) 180 MG tablet, Take 180 mg by mouth daily., Disp: , Rfl:  .  fluticasone (FLONASE) 50 MCG/ACT nasal spray, Place 2 sprays into both nostrils daily., Disp: 16 g, Rfl: 5 .  fluticasone (FLONASE) 50 MCG/ACT nasal spray, Place 1-2 sprays into both nostrils daily as needed for allergies or rhinitis., Disp: 16 g, Rfl: 5 .  furosemide (LASIX) 20 MG tablet, Take 20 mg by mouth every morning., Disp: , Rfl:  .  glucosamine-chondroitin 500-400 MG tablet, Take 1 tablet by mouth 2 (two) times daily.,  Disp: , Rfl:  .  lisinopril (ZESTRIL) 20 MG tablet, TAKE 2 TABLETS BY MOUTH ONCE DAILY FOR 90 DAYS, Disp: , Rfl:  .  LUMIGAN 0.01 % SOLN, INSTILL 1 DROP INTO BOTH EYES EVERY DAY IN THE EVENING, Disp: , Rfl:  .  montelukast (SINGULAIR) 10 MG tablet, Take 1 tablet (10 mg total) by mouth at bedtime., Disp: 30 tablet, Rfl: 5 .  Multiple Vitamin (  MULTIVITAMIN WITH MINERALS) TABS tablet, Take 1 tablet by mouth daily., Disp: , Rfl:  .  Omega-3 Fatty Acids (OMEGA 3 PO), Take 1 capsule by mouth daily., Disp: , Rfl:  .  omeprazole (PRILOSEC) 20 MG capsule, Take 1 capsule (20 mg total) by mouth daily., Disp: 30 capsule, Rfl: 5 .  PRESCRIPTION MEDICATION, once a week., Disp: , Rfl:  .  Tiotropium Bromide Monohydrate (SPIRIVA RESPIMAT) 1.25 MCG/ACT AERS, Inhale 2 puffs into the lungs daily., Disp: , Rfl:  .  vitamin C (ASCORBIC ACID) 500 MG tablet, Take 500 mg by mouth daily., Disp: , Rfl:  .  XOLAIR 150 MG injection, INJECT 300 MG SUBCUTANEOUSLY EVERY 4 WEEKS -REFRIGERATE DO NOT FREEZE, Disp: 6 each, Rfl: 3  Current Facility-Administered Medications:  .  omalizumab (XOLAIR) injection 300 mg, 300 mg, Subcutaneous, Q28 days, Kozlow, Donnamarie Poag, MD, 300 mg at 12/08/18 1138      Objective:   Vitals:   12/13/18 0909  BP: 130/78  Pulse: 81  Temp: (!) 97.2 F (36.2 C)  SpO2: 96%  Weight: 99.6 kg  Height: 5\' 3"  (1.6 m)    Estimated body mass index is 38.9 kg/m as calculated from the following:   Height as of this encounter: 5\' 3"  (1.6 m).   Weight as of this encounter: 99.6 kg.  @WEIGHTCHANGE @  Filed Weights   12/13/18 0909  Weight: 99.6 kg     Physical Exam  General Appearance:    Alert, cooperative, no distress, appears stated age - yes , Deconditioned looking - no , OBESE  - yes, Sitting on Wheelchair -  no  Head:    Normocephalic, without obvious abnormality, atraumatic  Eyes:    PERRL, conjunctiva/corneas clear,  Ears:    Normal TM's and external ear canals, both ears  Nose:   Nares  normal, septum midline, mucosa normal, no drainage    or sinus tenderness. OXYGEN ON  - no . Patient is @ ra   Throat:   Lips, mucosa, and tongue normal; teeth and gums normal. Cyanosis on lips - no  Neck:   Supple, symmetrical, trachea midline, no adenopathy;    thyroid:  no enlargement/tenderness/nodules; no carotid   bruit or JVD  Back:     Symmetric, no curvature, ROM normal, no CVA tenderness  Lungs:     Distress - no , Wheeze no, Barrell Chest - no, Purse lip breathing - no, Crackles - no   Chest Wall:    No tenderness or deformity.    Heart:    Regular rate and rhythm, S1 and S2 normal, no rub   or gallop, Murmur - no  Breast Exam:    NOT DONE  Abdomen:     Soft, non-tender, bowel sounds active all four quadrants,    no masses, no organomegaly. Visceral obesity - yes  Genitalia:   NOT DONE  Rectal:   NOT DONE  Extremities:   Extremities - normal, Has Cane - no, Clubbing - no, Edema - no  Pulses:   2+ and symmetric all extremities  Skin:   Stigmata of Connective Tissue Disease - no  Lymph nodes:   Cervical, supraclavicular, and axillary nodes normal  Psychiatric:  Neurologic:   Pleasant - yes, Anxious - no, Flat affect - no  CAm-ICU - neg, Alert and Oriented x 3 - yes, Moves all 4s - yes, Speech - normal, Cognition - intact           Assessment:  ICD-10-CM   1. Nodule of lower lobe of left lung  R91.1   2. Flu vaccine need  Z23   3. Pulmonary hamartoma ([place])  Q85.9   4. History of asthma  Z87.09   5. Solitary pulmonary nodule  R91.1 CT Chest Wo Contrast       Plan:     Patient Instructions     ICD-10-CM   1. Nodule of lower lobe of left lung  R91.1   2. Flu vaccine need  Z23   3. Pulmonary hamartoma ([place])  Q85.9   4. History of asthma  Z87.09      Nodule of lower lobe of left lung 17mm in CT may 10,2019 - > no change Aug 2019 -> July 2020 - low prob for malignancy  - do repeat CT chest without contrast in mid Sept 2021   Pulmonary hamartoma [facility])   - this is seen in left lung lower part and stable in CT scan May 2019 and Aug 2019 and July 2020   no active folllowup  History of asthma - per allergist - talk to PCP [name], MD and give trial off ace inhibitor for  1 month to see if it helps allergies and asthma  In need for flu shot  - flu shot 12/13/2018    Followup Sept 2021  after CT chest     SIGNATURE    Dr. Brand Males, M.D., F.C.C.P,  Pulmonary and Critical Care Medicine Staff Physician, [place] Director - Interstitial Lung Disease  Program  Pulmonary [place] [facility] [place], [place], [address]  Pager: [phone], If no answer or between  15:00h - 7:00h: call [phone] Telephone: [phone]  9:40 AM 12/13/2018

## 2018-12-14 ENCOUNTER — Telehealth: Payer: Self-pay

## 2018-12-14 NOTE — Telephone Encounter (Signed)
We received preoperative risk evaluation forms, patient is having surgery and is needing to be cleared from her asthma point. Dr. Verlin Fester stated she would need to come in 1 week prior to her surgery which is 01/17/2019. Patient scheduled an OV for 01/10/2019 forms will be filled out during this time. Forms have been placed on his desk to have filled out at time of visit.

## 2018-12-15 ENCOUNTER — Ambulatory Visit (INDEPENDENT_AMBULATORY_CARE_PROVIDER_SITE_OTHER): Payer: BC Managed Care – PPO | Admitting: *Deleted

## 2018-12-15 DIAGNOSIS — J309 Allergic rhinitis, unspecified: Secondary | ICD-10-CM

## 2018-12-21 ENCOUNTER — Ambulatory Visit (INDEPENDENT_AMBULATORY_CARE_PROVIDER_SITE_OTHER): Payer: BC Managed Care – PPO

## 2018-12-21 DIAGNOSIS — J309 Allergic rhinitis, unspecified: Secondary | ICD-10-CM

## 2018-12-27 ENCOUNTER — Ambulatory Visit (INDEPENDENT_AMBULATORY_CARE_PROVIDER_SITE_OTHER): Payer: BC Managed Care – PPO

## 2018-12-27 DIAGNOSIS — J309 Allergic rhinitis, unspecified: Secondary | ICD-10-CM

## 2018-12-30 DIAGNOSIS — J209 Acute bronchitis, unspecified: Secondary | ICD-10-CM | POA: Diagnosis not present

## 2018-12-30 DIAGNOSIS — M1612 Unilateral primary osteoarthritis, left hip: Secondary | ICD-10-CM | POA: Diagnosis not present

## 2018-12-30 DIAGNOSIS — I10 Essential (primary) hypertension: Secondary | ICD-10-CM | POA: Diagnosis not present

## 2018-12-30 DIAGNOSIS — Z01818 Encounter for other preprocedural examination: Secondary | ICD-10-CM | POA: Diagnosis not present

## 2019-01-03 ENCOUNTER — Ambulatory Visit (INDEPENDENT_AMBULATORY_CARE_PROVIDER_SITE_OTHER): Payer: BC Managed Care – PPO

## 2019-01-03 DIAGNOSIS — J309 Allergic rhinitis, unspecified: Secondary | ICD-10-CM

## 2019-01-04 DIAGNOSIS — J4550 Severe persistent asthma, uncomplicated: Secondary | ICD-10-CM | POA: Diagnosis not present

## 2019-01-04 NOTE — Telephone Encounter (Signed)
FYI for next Monday.

## 2019-01-04 NOTE — Telephone Encounter (Signed)
Noted. Thanks.

## 2019-01-05 ENCOUNTER — Ambulatory Visit (INDEPENDENT_AMBULATORY_CARE_PROVIDER_SITE_OTHER): Payer: BC Managed Care – PPO | Admitting: *Deleted

## 2019-01-05 ENCOUNTER — Other Ambulatory Visit: Payer: Self-pay

## 2019-01-05 DIAGNOSIS — J4550 Severe persistent asthma, uncomplicated: Secondary | ICD-10-CM | POA: Diagnosis not present

## 2019-01-10 ENCOUNTER — Encounter: Payer: Self-pay | Admitting: Allergy and Immunology

## 2019-01-10 ENCOUNTER — Ambulatory Visit (INDEPENDENT_AMBULATORY_CARE_PROVIDER_SITE_OTHER): Payer: BC Managed Care – PPO | Admitting: Allergy and Immunology

## 2019-01-10 ENCOUNTER — Other Ambulatory Visit: Payer: Self-pay

## 2019-01-10 VITALS — BP 144/78 | HR 98 | Temp 97.7°F | Resp 16 | Ht 62.6 in | Wt 218.2 lb

## 2019-01-10 DIAGNOSIS — J3089 Other allergic rhinitis: Secondary | ICD-10-CM | POA: Diagnosis not present

## 2019-01-10 DIAGNOSIS — J309 Allergic rhinitis, unspecified: Secondary | ICD-10-CM | POA: Diagnosis not present

## 2019-01-10 DIAGNOSIS — J4550 Severe persistent asthma, uncomplicated: Secondary | ICD-10-CM

## 2019-01-10 DIAGNOSIS — K219 Gastro-esophageal reflux disease without esophagitis: Secondary | ICD-10-CM | POA: Diagnosis not present

## 2019-01-10 MED ORDER — SPIRIVA RESPIMAT 1.25 MCG/ACT IN AERS: 2.0000 | INHALATION_SPRAY | Freq: Every day | RESPIRATORY_TRACT | 5 refills | Status: DC

## 2019-01-10 MED ORDER — ALBUTEROL SULFATE HFA 108 (90 BASE) MCG/ACT IN AERS
2.0000 | INHALATION_SPRAY | RESPIRATORY_TRACT | 1 refills | Status: AC | PRN
Start: 2019-01-10 — End: ?

## 2019-01-10 MED ORDER — MONTELUKAST SODIUM 10 MG PO TABS: 10.0000 mg | ORAL_TABLET | Freq: Every day | ORAL | 5 refills | Status: DC

## 2019-01-10 MED ORDER — BUDESONIDE-FORMOTEROL FUMARATE 160-4.5 MCG/ACT IN AERO: 2.0000 | INHALATION_SPRAY | Freq: Two times a day (BID) | RESPIRATORY_TRACT | 5 refills | Status: DC

## 2019-01-10 NOTE — Progress Notes (Signed)
Follow-up Note  RE: Melanie Armstrong MRN: [id number] DOB: [birth year] Date of Office Visit: 01/10/2019  Primary care provider: [name], MD Referring provider: [name], MD  History of present illness: Melanie Armstrong is a 63 y.o. female with persistent asthma and allergic rhinoconjunctivitis presenting today for follow-up.  She was last seen in this clinic on November 01, 2018.  She reports that her asthma is well controlled.  She typically requires albuterol rescue 1, or maybe 2 times, per week on average and does not experience limitations in normal daily activities or nocturnal awakenings due to lower respiratory symptoms.  For asthma control, she is taking Symbicort 160-4.5 g, 2 inhalations twice daily via spacer device, Spiriva 125 g, 2 inhalations daily, and montelukast 10 mg daily at bedtime.  She requests a refill for brand name Singulair because in the past generic montelukast "did not work." She saw her pulmonologist in August who assessed her pulmonary nodule to be stable and she will follow-up again with her pulmonologist in 9 months.  She is having left total hip arthroplasty in early October.  She had right hip arthroplasty in [id number] without complications. Her nasal allergy symptoms are well controlled with immunotherapy injections per protocol and montelukast.  She also notes that her acid reflux is well controlled with omeprazole daily.  Assessment and plan: Severe persistent asthma Well-controlled and stable.  Continue omalizumab injections monthly, Symbicort 160/4.5 g 2 inhalations via spacer device twice a day, Spiriva Respimat 1.25 g, 2 inhalations daily, Singulair 10 mg daily, and albuterol every 4-6 hours as needed.  A prescription has been provided for brand name Singulair to replace the generic medication.  Subjective and objective measures of pulmonary function will be followed and the treatment plan will be adjusted accordingly.  Allergic rhinitis Stable.   Continue appropriate allergen avoidance measures and immunotherapy injections as prescribed.  Treatment plan as outlined above for acute sinusitis.  GERD (gastroesophageal reflux disease)  Continue reflux lifestyle modifications and omeprazole as prescribed.   Meds ordered this encounter  Medications  . budesonide-formoterol (SYMBICORT) 160-4.5 MCG/ACT inhaler    Sig: Inhale 2 puffs into the lungs 2 (two) times daily.    Dispense:  1 Inhaler    Refill:  5  . albuterol (PROAIR HFA) 108 (90 Base) MCG/ACT inhaler    Sig: Inhale 2 puffs into the lungs every 4 (four) hours as needed.    Dispense:  18 g    Refill:  1  . DISCONTD: montelukast (SINGULAIR) 10 MG tablet    Sig: Take 1 tablet (10 mg total) by mouth at bedtime.    Dispense:  30 tablet    Refill:  5  . Tiotropium Bromide Monohydrate (SPIRIVA RESPIMAT) 1.25 MCG/ACT AERS    Sig: Inhale 2 puffs into the lungs daily.    Dispense:  4 g    Refill:  5  . montelukast (SINGULAIR) 10 MG tablet    Sig: Take 1 tablet (10 mg total) by mouth at bedtime.    Dispense:  30 tablet    Refill:  5    Please dispense name brand Singulair.    Diagnostics: Spirometry reveals an FVC of 2.76 L and an FEV1 of 2.40 L (107% predicted) without postbronchodilator improvement.  Please see scanned spirometry results for details.    Physical examination: Blood pressure (!) 144/78, pulse 98, temperature 97.7 F (36.5 C), temperature source Temporal, resp. rate 16, height 5' 2.6" (1.59 m), weight 218 lb 3.2 oz (99 kg),  SpO2 95 %.  General: Alert, interactive, in no acute distress. HEENT: TMs pearly gray, turbinates mildly edematous without discharge, post-pharynx unremarkable. Neck: Supple without lymphadenopathy. Lungs: Clear to auscultation without wheezing, rhonchi or rales. CV: Normal S1, S2 without murmurs. Skin: Warm and dry, without lesions or rashes.  The following portions of the patient's history were reviewed and updated as  appropriate: allergies, current medications, past family history, past medical history, past social history, past surgical history and problem list.  Allergies as of 01/10/2019      Reactions   Advair Diskus [fluticasone-salmeterol]    Per patient, she thinks she developed walking PNA.    Avelox [moxifloxacin Hcl In Nacl] Other (See Comments)   Muscle Aches   Levofloxacin Other (See Comments)   Tendon pain   Protonix [pantoprazole Sodium] Diarrhea      Medication List       Accurate as of January 10, 2019  2:11 PM. If you have any questions, ask your nurse or doctor.        albuterol (2.5 MG/3ML) 0.083% nebulizer solution Commonly known as: PROVENTIL USE 1 VIAL IN NEBULIZER EVERY 4 HOURS AS NEEDED FOR WHEEZING OR  SHORTNESS  OF  BREATH What changed: Another medication with the same name was changed. Make sure you understand how and when to take each. Changed by: Edmonia Lynch, MD   albuterol 108 (90 Base) MCG/ACT inhaler Commonly known as: ProAir HFA Inhale 2 puffs into the lungs every 4 (four) hours as needed. What changed: reasons to take this Changed by: R Edgar Frisk, MD   amoxicillin 500 MG capsule Commonly known as: AMOXIL TAKE 4 CAPSULES ONE HOUR PRIOR TO DENTAL PROCEDURE.   Azelastine HCl 0.15 % Soln Place 2 sprays into both nostrils 2 (two) times daily.   budesonide-formoterol 160-4.5 MCG/ACT inhaler Commonly known as: SYMBICORT Inhale 2 puffs into the lungs 2 (two) times daily.   calcium-vitamin D 500-200 MG-UNIT tablet Commonly known as: OSCAL WITH D Take 1 tablet by mouth daily with breakfast.   diazepam 5 MG tablet Commonly known as: VALIUM Take 5 mg by mouth every 6 (six) hours as needed for anxiety.   EPINEPHrine 0.3 mg/0.3 mL Soaj injection Commonly known as: EpiPen 2-Pak USE AS DIRECTED FOR SEVERE ALLERGIC REACTION   ferrous sulfate 325 (65 FE) MG tablet Take 1 tablet (325 mg total) by mouth 3 (three) times daily after meals. What  changed: when to take this   fexofenadine 180 MG tablet Commonly known as: ALLEGRA Take 180 mg by mouth daily.   fluticasone 50 MCG/ACT nasal spray Commonly known as: FLONASE Place 1-2 sprays into both nostrils daily as needed for allergies or rhinitis. What changed: Another medication with the same name was removed. Continue taking this medication, and follow the directions you see here. Changed by: Edmonia Lynch, MD   furosemide 20 MG tablet Commonly known as: LASIX Take 20 mg by mouth every morning.   glucosamine-chondroitin 500-400 MG tablet Take 1 tablet by mouth 2 (two) times daily.   lisinopril 20 MG tablet Commonly known as: ZESTRIL TAKE 2 TABLETS BY MOUTH ONCE DAILY FOR 90 DAYS   Lumigan 0.01 % Soln Generic drug: bimatoprost INSTILL 1 DROP INTO BOTH EYES EVERY DAY IN THE EVENING   montelukast 10 MG tablet Commonly known as: SINGULAIR Take 1 tablet (10 mg total) by mouth at bedtime.   multivitamin with minerals Tabs tablet Take 1 tablet by mouth daily.   OMEGA 3 PO Take 1  capsule by mouth daily.   omeprazole 20 MG capsule Commonly known as: PRILOSEC Take 1 capsule (20 mg total) by mouth daily.   PRESCRIPTION MEDICATION once a week.   Spiriva Respimat 1.25 MCG/ACT Aers Generic drug: Tiotropium Bromide Monohydrate Inhale 2 puffs into the lungs daily.   vitamin C 500 MG tablet Commonly known as: ASCORBIC ACID Take 500 mg by mouth daily.   Xolair 150 MG injection Generic drug: omalizumab INJECT 300 MG SUBCUTANEOUSLY EVERY 4 WEEKS -REFRIGERATE DO NOT FREEZE       Allergies  Allergen Reactions  . Advair Diskus [Fluticasone-Salmeterol]     Per patient, she thinks she developed walking PNA.   Marland Kitchen Avelox [Moxifloxacin Hcl In Nacl] Other (See Comments)    Muscle Aches  . Levofloxacin Other (See Comments)    Tendon pain  . Protonix [Pantoprazole Sodium] Diarrhea   Review of systems: Review of systems negative except as noted in HPI / PMHx or noted  below: Constitutional: Negative.  HENT: Negative.   Eyes: Negative.  Respiratory: Negative.   Cardiovascular: Negative.  Gastrointestinal: Negative.  Genitourinary: Negative.  Musculoskeletal: Negative.  Neurological: Negative.  Endo/Heme/Allergies: Negative.  Cutaneous: Negative.  Past Medical History:  Diagnosis Date  . Arthritis   . Asthma   . GERD (gastroesophageal reflux disease)   . Heart murmur    as a child   . Hypertension     Family History  Problem Relation Age of Onset  . Heart disease Father   . Allergic rhinitis Neg Hx   . Angioedema Neg Hx   . Asthma Neg Hx   . Eczema Neg Hx   . Immunodeficiency Neg Hx   . Urticaria Neg Hx     Social History   Socioeconomic History  . Marital status: Married    Spouse name: Not on file  . Number of children: Not on file  . Years of education: Not on file  . Highest education level: Not on file  Occupational History  . Not on file  Social Needs  . Financial resource strain: Not on file  . Food insecurity    Worry: Not on file    Inability: Not on file  . Transportation needs    Medical: Not on file    Non-medical: Not on file  Tobacco Use  . Smoking status: Never Smoker  . Smokeless tobacco: Never Used  Substance and Sexual Activity  . Alcohol use: No  . Drug use: No  . Sexual activity: Not on file  Lifestyle  . Physical activity    Days per week: Not on file    Minutes per session: Not on file  . Stress: Not on file  Relationships  . Social Herbalist on phone: Not on file    Gets together: Not on file    Attends religious service: Not on file    Active member of club or organization: Not on file    Attends meetings of clubs or organizations: Not on file    Relationship status: Not on file  . Intimate partner violence    Fear of current or ex partner: Not on file    Emotionally abused: Not on file    Physically abused: Not on file    Forced sexual activity: Not on file  Other Topics  Concern  . Not on file  Social History Narrative  . Not on file    I appreciate the opportunity to take part in [place] care. Please do  not hesitate to contact me with questions.  Sincerely,   R. Edgar Frisk, MD

## 2019-01-10 NOTE — Patient Instructions (Addendum)
Severe persistent asthma Well-controlled and stable.  Continue omalizumab injections monthly, Symbicort 160/4.5 g 2 inhalations via spacer device twice a day, Spiriva Respimat 1.25 g, 2 inhalations daily, Singulair 10 mg daily, and albuterol every 4-6 hours as needed.  A prescription has been provided for brand name Singulair to replace the generic medication.  Subjective and objective measures of pulmonary function will be followed and the treatment plan will be adjusted accordingly.  Allergic rhinitis Stable.  Continue appropriate allergen avoidance measures and immunotherapy injections as prescribed.  Treatment plan as outlined above for acute sinusitis.  GERD (gastroesophageal reflux disease)  Continue reflux lifestyle modifications and omeprazole as prescribed.   Return in about 4 months (around 05/12/2019), or if symptoms worsen or fail to improve.

## 2019-01-10 NOTE — Assessment & Plan Note (Signed)
Stable.  Continue appropriate allergen avoidance measures and immunotherapy injections as prescribed.  Treatment plan as outlined above for acute sinusitis.

## 2019-01-10 NOTE — Assessment & Plan Note (Signed)
   Continue reflux lifestyle modifications and omeprazole as prescribed. 

## 2019-01-10 NOTE — Assessment & Plan Note (Addendum)
Well-controlled and stable.  Continue omalizumab injections monthly, Symbicort 160/4.5 g 2 inhalations via spacer device twice a day, Spiriva Respimat 1.25 g, 2 inhalations daily, Singulair 10 mg daily, and albuterol every 4-6 hours as needed.  A prescription has been provided for brand name Singulair to replace the generic medication.  Subjective and objective measures of pulmonary function will be followed and the treatment plan will be adjusted accordingly.

## 2019-01-11 DIAGNOSIS — J3089 Other allergic rhinitis: Secondary | ICD-10-CM | POA: Diagnosis not present

## 2019-01-11 NOTE — Progress Notes (Signed)
Vial exp 01-11-20

## 2019-01-13 HISTORY — PX: TOTAL HIP ARTHROPLASTY: SHX124

## 2019-01-17 DIAGNOSIS — M1612 Unilateral primary osteoarthritis, left hip: Secondary | ICD-10-CM | POA: Diagnosis not present

## 2019-01-24 ENCOUNTER — Ambulatory Visit (INDEPENDENT_AMBULATORY_CARE_PROVIDER_SITE_OTHER): Payer: BC Managed Care – PPO

## 2019-01-24 DIAGNOSIS — J309 Allergic rhinitis, unspecified: Secondary | ICD-10-CM

## 2019-01-28 ENCOUNTER — Other Ambulatory Visit: Payer: Self-pay | Admitting: Allergy and Immunology

## 2019-01-31 ENCOUNTER — Ambulatory Visit (INDEPENDENT_AMBULATORY_CARE_PROVIDER_SITE_OTHER): Payer: BC Managed Care – PPO

## 2019-01-31 DIAGNOSIS — J309 Allergic rhinitis, unspecified: Secondary | ICD-10-CM | POA: Diagnosis not present

## 2019-02-01 DIAGNOSIS — J4550 Severe persistent asthma, uncomplicated: Secondary | ICD-10-CM | POA: Diagnosis not present

## 2019-02-02 ENCOUNTER — Ambulatory Visit (INDEPENDENT_AMBULATORY_CARE_PROVIDER_SITE_OTHER): Payer: BC Managed Care – PPO | Admitting: *Deleted

## 2019-02-02 ENCOUNTER — Other Ambulatory Visit: Payer: Self-pay

## 2019-02-02 DIAGNOSIS — J4550 Severe persistent asthma, uncomplicated: Secondary | ICD-10-CM

## 2019-02-07 ENCOUNTER — Ambulatory Visit (INDEPENDENT_AMBULATORY_CARE_PROVIDER_SITE_OTHER): Payer: BC Managed Care – PPO | Admitting: *Deleted

## 2019-02-07 DIAGNOSIS — J309 Allergic rhinitis, unspecified: Secondary | ICD-10-CM

## 2019-02-14 ENCOUNTER — Ambulatory Visit (INDEPENDENT_AMBULATORY_CARE_PROVIDER_SITE_OTHER): Payer: BC Managed Care – PPO

## 2019-02-14 DIAGNOSIS — J309 Allergic rhinitis, unspecified: Secondary | ICD-10-CM

## 2019-02-21 ENCOUNTER — Ambulatory Visit (INDEPENDENT_AMBULATORY_CARE_PROVIDER_SITE_OTHER): Payer: BC Managed Care – PPO

## 2019-02-21 DIAGNOSIS — J309 Allergic rhinitis, unspecified: Secondary | ICD-10-CM

## 2019-02-28 ENCOUNTER — Ambulatory Visit (INDEPENDENT_AMBULATORY_CARE_PROVIDER_SITE_OTHER): Payer: BC Managed Care – PPO

## 2019-02-28 DIAGNOSIS — J309 Allergic rhinitis, unspecified: Secondary | ICD-10-CM

## 2019-03-01 DIAGNOSIS — J4550 Severe persistent asthma, uncomplicated: Secondary | ICD-10-CM | POA: Diagnosis not present

## 2019-03-02 ENCOUNTER — Ambulatory Visit (INDEPENDENT_AMBULATORY_CARE_PROVIDER_SITE_OTHER): Payer: BC Managed Care – PPO

## 2019-03-02 ENCOUNTER — Other Ambulatory Visit: Payer: Self-pay

## 2019-03-02 DIAGNOSIS — J4550 Severe persistent asthma, uncomplicated: Secondary | ICD-10-CM | POA: Diagnosis not present

## 2019-03-03 DIAGNOSIS — Z471 Aftercare following joint replacement surgery: Secondary | ICD-10-CM | POA: Diagnosis not present

## 2019-03-03 DIAGNOSIS — Z96642 Presence of left artificial hip joint: Secondary | ICD-10-CM | POA: Diagnosis not present

## 2019-03-07 ENCOUNTER — Ambulatory Visit (INDEPENDENT_AMBULATORY_CARE_PROVIDER_SITE_OTHER): Payer: BC Managed Care – PPO

## 2019-03-07 ENCOUNTER — Ambulatory Visit: Payer: BC Managed Care – PPO | Admitting: Allergy and Immunology

## 2019-03-07 DIAGNOSIS — J309 Allergic rhinitis, unspecified: Secondary | ICD-10-CM

## 2019-03-07 DIAGNOSIS — J0100 Acute maxillary sinusitis, unspecified: Secondary | ICD-10-CM | POA: Diagnosis not present

## 2019-03-14 ENCOUNTER — Ambulatory Visit (INDEPENDENT_AMBULATORY_CARE_PROVIDER_SITE_OTHER): Payer: BC Managed Care – PPO | Admitting: *Deleted

## 2019-03-14 DIAGNOSIS — J309 Allergic rhinitis, unspecified: Secondary | ICD-10-CM | POA: Diagnosis not present

## 2019-03-21 ENCOUNTER — Ambulatory Visit (INDEPENDENT_AMBULATORY_CARE_PROVIDER_SITE_OTHER): Payer: BC Managed Care – PPO | Admitting: *Deleted

## 2019-03-21 DIAGNOSIS — J309 Allergic rhinitis, unspecified: Secondary | ICD-10-CM

## 2019-03-28 ENCOUNTER — Ambulatory Visit (INDEPENDENT_AMBULATORY_CARE_PROVIDER_SITE_OTHER): Payer: BC Managed Care – PPO

## 2019-03-28 DIAGNOSIS — J4550 Severe persistent asthma, uncomplicated: Secondary | ICD-10-CM

## 2019-03-28 DIAGNOSIS — J309 Allergic rhinitis, unspecified: Secondary | ICD-10-CM | POA: Diagnosis not present

## 2019-03-29 ENCOUNTER — Other Ambulatory Visit: Payer: Self-pay

## 2019-03-29 ENCOUNTER — Ambulatory Visit (INDEPENDENT_AMBULATORY_CARE_PROVIDER_SITE_OTHER): Payer: BC Managed Care – PPO

## 2019-03-29 DIAGNOSIS — J4550 Severe persistent asthma, uncomplicated: Secondary | ICD-10-CM | POA: Diagnosis not present

## 2019-03-30 ENCOUNTER — Ambulatory Visit: Payer: Self-pay

## 2019-04-04 ENCOUNTER — Ambulatory Visit (INDEPENDENT_AMBULATORY_CARE_PROVIDER_SITE_OTHER): Payer: BC Managed Care – PPO

## 2019-04-04 DIAGNOSIS — J309 Allergic rhinitis, unspecified: Secondary | ICD-10-CM | POA: Diagnosis not present

## 2019-04-12 ENCOUNTER — Ambulatory Visit (INDEPENDENT_AMBULATORY_CARE_PROVIDER_SITE_OTHER): Payer: BC Managed Care – PPO

## 2019-04-12 DIAGNOSIS — J309 Allergic rhinitis, unspecified: Secondary | ICD-10-CM

## 2019-04-14 DIAGNOSIS — Z96642 Presence of left artificial hip joint: Secondary | ICD-10-CM | POA: Diagnosis not present

## 2019-04-14 DIAGNOSIS — Z471 Aftercare following joint replacement surgery: Secondary | ICD-10-CM | POA: Diagnosis not present

## 2019-04-18 ENCOUNTER — Ambulatory Visit (INDEPENDENT_AMBULATORY_CARE_PROVIDER_SITE_OTHER): Payer: BC Managed Care – PPO

## 2019-04-18 DIAGNOSIS — J309 Allergic rhinitis, unspecified: Secondary | ICD-10-CM | POA: Diagnosis not present

## 2019-04-19 DIAGNOSIS — J3089 Other allergic rhinitis: Secondary | ICD-10-CM | POA: Diagnosis not present

## 2019-04-20 NOTE — Progress Notes (Signed)
VIAL EXP 04-19-20

## 2019-04-25 ENCOUNTER — Ambulatory Visit (INDEPENDENT_AMBULATORY_CARE_PROVIDER_SITE_OTHER): Payer: BC Managed Care – PPO

## 2019-04-25 DIAGNOSIS — J309 Allergic rhinitis, unspecified: Secondary | ICD-10-CM | POA: Diagnosis not present

## 2019-04-26 DIAGNOSIS — J4550 Severe persistent asthma, uncomplicated: Secondary | ICD-10-CM | POA: Diagnosis not present

## 2019-04-27 ENCOUNTER — Ambulatory Visit (INDEPENDENT_AMBULATORY_CARE_PROVIDER_SITE_OTHER): Payer: BC Managed Care – PPO | Admitting: *Deleted

## 2019-04-27 ENCOUNTER — Other Ambulatory Visit: Payer: Self-pay

## 2019-04-27 DIAGNOSIS — J4550 Severe persistent asthma, uncomplicated: Secondary | ICD-10-CM | POA: Diagnosis not present

## 2019-05-02 ENCOUNTER — Ambulatory Visit (INDEPENDENT_AMBULATORY_CARE_PROVIDER_SITE_OTHER): Payer: BC Managed Care – PPO

## 2019-05-02 DIAGNOSIS — J4550 Severe persistent asthma, uncomplicated: Secondary | ICD-10-CM

## 2019-05-02 DIAGNOSIS — J309 Allergic rhinitis, unspecified: Secondary | ICD-10-CM

## 2019-05-09 ENCOUNTER — Ambulatory Visit (INDEPENDENT_AMBULATORY_CARE_PROVIDER_SITE_OTHER): Payer: BC Managed Care – PPO

## 2019-05-09 DIAGNOSIS — J309 Allergic rhinitis, unspecified: Secondary | ICD-10-CM | POA: Diagnosis not present

## 2019-05-16 ENCOUNTER — Encounter: Payer: Self-pay | Admitting: Allergy and Immunology

## 2019-05-16 ENCOUNTER — Ambulatory Visit: Payer: BC Managed Care – PPO | Admitting: Allergy and Immunology

## 2019-05-16 ENCOUNTER — Other Ambulatory Visit: Payer: Self-pay

## 2019-05-16 VITALS — BP 142/82 | HR 79 | Temp 97.6°F | Resp 16

## 2019-05-16 DIAGNOSIS — J4550 Severe persistent asthma, uncomplicated: Secondary | ICD-10-CM

## 2019-05-16 DIAGNOSIS — K219 Gastro-esophageal reflux disease without esophagitis: Secondary | ICD-10-CM | POA: Diagnosis not present

## 2019-05-16 DIAGNOSIS — J309 Allergic rhinitis, unspecified: Secondary | ICD-10-CM | POA: Diagnosis not present

## 2019-05-16 DIAGNOSIS — J3089 Other allergic rhinitis: Secondary | ICD-10-CM | POA: Diagnosis not present

## 2019-05-16 MED ORDER — SPIRIVA HANDIHALER 18 MCG IN CAPS: 18.0000 ug | ORAL_CAPSULE | RESPIRATORY_TRACT | 5 refills | Status: DC

## 2019-05-16 MED ORDER — MONTELUKAST SODIUM 10 MG PO TABS: 10.0000 mg | ORAL_TABLET | Freq: Every day | ORAL | 5 refills | Status: DC

## 2019-05-16 NOTE — Progress Notes (Signed)
Follow-up Note  RE: Melanie Armstrong MRN: [id number] DOB: [birth year] Date of Office Visit: 05/16/2019  Primary care provider: [name], MD Referring provider: [name], MD  History of present illness: Melanie Armstrong is a 64 y.o. female with persistent asthma and allergic rhinoconjunctivitis presenting today for follow-up.  She was last seen in this clinic in September 2020.  She had a hip replacement without complications and October 2020.  She is currently doing rehab at home without problems.  She reports that her asthma is stable.  She typically requires albuterol rescue 2-3 times per week on average and experiences nocturnal awakenings 3-4 times per month on average.  She denies limitations in normal daily activities.  She has been compliant with omalizumab injections, Symbicort 160-4.5 g, 2 inhalations via spacer device twice daily, Spiriva HandiHaler 18 g daily, and Singulair 10 mg daily.  She reports that her insurance will no longer pay for the brand name Singulair and so she will need to switch to montelukast despite the fact that this medication has not seemed to be as effective for her in the past.  She also needs a refill for the Spiriva.  She has been receiving immunotherapy injections and uses fluticasone nasal spray and azelastine nasal spray daily.  She reports that nasal congestion "comes and goes but not very often."  She has no new problems or complaints today.  Assessment and plan: Severe persistent asthma  Continue omalizumab injections monthly, Symbicort 160/4.5 g 2 inhalations via spacer device twice a day, Spiriva Handihaler 18 g daily, montelukast 10 mg daily, and albuterol every 4-6 hours as needed.  Refill prescriptions have been provided for Spiriva and montelukast.  Subjective and objective measures of pulmonary function will be followed and the treatment plan will be adjusted accordingly.  Allergic rhinitis Stable.  Continue appropriate allergen  avoidance measures and immunotherapy injections as prescribed.  Continue montelukast 10 mg daily, fluticasone nasal spray daily as needed, and azelastine nasal spray as needed.  Nasal saline spray (i.e., Simply Saline) or nasal saline lavage (i.e., NeilMed) is recommended as needed and prior to medicated nasal sprays.  GERD (gastroesophageal reflux disease)  Continue reflux lifestyle modifications and omeprazole as prescribed.   Meds ordered this encounter  Medications  . montelukast (SINGULAIR) 10 MG tablet    Sig: Take 1 tablet (10 mg total) by mouth at bedtime.    Dispense:  30 tablet    Refill:  5    Please dispense generic Singulair.  Marland Kitchen tiotropium (SPIRIVA HANDIHALER) 18 MCG inhalation capsule    Sig: Place 1 capsule (18 mcg total) into inhaler and inhale 1 day or 1 dose.    Dispense:  30 capsule    Refill:  5    Diagnostics: Spirometry:  Normal with an FEV1 of 115% predicted. This study was performed while the patient was asymptomatic.  Please see scanned spirometry results for details.    Physical examination: Blood pressure (!) 142/82, pulse 79, temperature 97.6 F (36.4 C), temperature source Temporal, resp. rate 16, SpO2 96 %.  General: Alert, interactive, in no acute distress. HEENT: TMs pearly gray, turbinates mildly edematous without discharge, post-pharynx unremarkable. Neck: Supple without lymphadenopathy. Lungs: Clear to auscultation without wheezing, rhonchi or rales. CV: Normal S1, S2 without murmurs. Skin: Warm and dry, without lesions or rashes.  The following portions of the patient's history were reviewed and updated as appropriate: allergies, current medications, past family history, past medical history, past social history, past surgical history and  problem list.   Current Outpatient Medications  Medication Sig Dispense Refill  . albuterol (PROAIR HFA) 108 (90 Base) MCG/ACT inhaler Inhale 2 puffs into the lungs every 4 (four) hours as needed. 18 g  1  . albuterol (PROVENTIL) (2.5 MG/3ML) 0.083% nebulizer solution USE 1 VIAL IN NEBULIZER EVERY 4 HOURS AS NEEDED FOR WHEEZING OR  SHORTNESS  OF  BREATH 600 mL 0  . Azelastine HCl 0.15 % SOLN Place 2 sprays into both nostrils 2 (two) times daily. 30 mL 5  . budesonide-formoterol (SYMBICORT) 160-4.5 MCG/ACT inhaler Inhale 2 puffs into the lungs 2 (two) times daily. 1 Inhaler 5  . calcium-vitamin D (OSCAL WITH D) 500-200 MG-UNIT per tablet Take 1 tablet by mouth daily with breakfast.    . diazepam (VALIUM) 5 MG tablet Take 5 mg by mouth every 6 (six) hours as needed for anxiety.    Marland Kitchen EPINEPHrine (EPIPEN 2-PAK) 0.3 mg/0.3 mL IJ SOAJ injection USE AS DIRECTED FOR SEVERE ALLERGIC REACTION 2 Device 1  . ferrous sulfate 325 (65 FE) MG tablet Take 1 tablet (325 mg total) by mouth 3 (three) times daily after meals. (Patient taking differently: Take 325 mg by mouth daily with breakfast. )  3  . fexofenadine (ALLEGRA) 180 MG tablet Take 180 mg by mouth daily.    . fluticasone (FLONASE) 50 MCG/ACT nasal spray Place 1-2 sprays into both nostrils daily as needed for allergies or rhinitis. 16 g 5  . furosemide (LASIX) 20 MG tablet Take 20 mg by mouth every morning.    Marland Kitchen glucosamine-chondroitin 500-400 MG tablet Take 1 tablet by mouth 2 (two) times daily.    Marland Kitchen lisinopril (ZESTRIL) 20 MG tablet TAKE 2 TABLETS BY MOUTH ONCE DAILY FOR 90 DAYS    . LUMIGAN 0.01 % SOLN INSTILL 1 DROP INTO BOTH EYES EVERY DAY IN THE EVENING    . montelukast (SINGULAIR) 10 MG tablet Take 1 tablet (10 mg total) by mouth at bedtime. 30 tablet 5  . Multiple Vitamin (MULTIVITAMIN WITH MINERALS) TABS tablet Take 1 tablet by mouth daily.    . Omega-3 Fatty Acids (OMEGA 3 PO) Take 1 capsule by mouth daily.    Marland Kitchen omeprazole (PRILOSEC) 20 MG capsule Take 1 capsule (20 mg total) by mouth daily. 30 capsule 5  . PRESCRIPTION MEDICATION once a week.    . Tiotropium Bromide Monohydrate (SPIRIVA RESPIMAT) 1.25 MCG/ACT AERS Inhale 2 puffs into the lungs  daily. 4 g 5  . vitamin C (ASCORBIC ACID) 500 MG tablet Take 500 mg by mouth daily.    Arvid Right 150 MG injection INJECT 300 MG SUBCUTANEOUSLY EVERY 4 WEEKS -REFRIGERATE DO NOT FREEZE 6 each 3  . tiotropium (SPIRIVA HANDIHALER) 18 MCG inhalation capsule Place 1 capsule (18 mcg total) into inhaler and inhale 1 day or 1 dose. 30 capsule 5   Current Facility-Administered Medications  Medication Dose Route Frequency Provider Last Rate Last Admin  . omalizumab Arvid Right) injection 300 mg  300 mg Subcutaneous Q28 days Jiles Prows, MD   300 mg at 04/27/19 1143    Allergies  Allergen Reactions  . Advair Diskus [Fluticasone-Salmeterol]     Per patient, she thinks she developed walking PNA.   Marland Kitchen Avelox [Moxifloxacin Hcl In Nacl] Other (See Comments)    Muscle Aches  . Levofloxacin Other (See Comments)    Tendon pain  . Protonix [Pantoprazole Sodium] Diarrhea   Review of systems: Review of systems negative except as noted in HPI / PMHx.  Past  Medical History:  Diagnosis Date  . Arthritis   . Asthma   . GERD (gastroesophageal reflux disease)   . Heart murmur    as a child   . Hypertension     Family History  Problem Relation Age of Onset  . Heart disease Father   . Allergic rhinitis Neg Hx   . Angioedema Neg Hx   . Asthma Neg Hx   . Eczema Neg Hx   . Immunodeficiency Neg Hx   . Urticaria Neg Hx     Social History   Socioeconomic History  . Marital status: Married    Spouse name: Not on file  . Number of children: Not on file  . Years of education: Not on file  . Highest education level: Not on file  Occupational History  . Not on file  Tobacco Use  . Smoking status: Never Smoker  . Smokeless tobacco: Never Used  Substance and Sexual Activity  . Alcohol use: No  . Drug use: No  . Sexual activity: Not on file  Other Topics Concern  . Not on file  Social History Narrative  . Not on file   Social Determinants of Health   Financial Resource Strain:   . Difficulty of  Paying Living Expenses: Not on file  Food Insecurity:   . Worried About Charity fundraiser in the Last Year: Not on file  . Ran Out of Food in the Last Year: Not on file  Transportation Needs:   . Lack of Transportation (Medical): Not on file  . Lack of Transportation (Non-Medical): Not on file  Physical Activity:   . Days of Exercise per Week: Not on file  . Minutes of Exercise per Session: Not on file  Stress:   . Feeling of Stress : Not on file  Social Connections:   . Frequency of Communication with Friends and Family: Not on file  . Frequency of Social Gatherings with Friends and Family: Not on file  . Attends Religious Services: Not on file  . Active Member of Clubs or Organizations: Not on file  . Attends Archivist Meetings: Not on file  . Marital Status: Not on file  Intimate Partner Violence:   . Fear of Current or Ex-Partner: Not on file  . Emotionally Abused: Not on file  . Physically Abused: Not on file  . Sexually Abused: Not on file    I appreciate the opportunity to take part in [place] care. Please do not hesitate to contact me with questions.  Sincerely,   R. Edgar Frisk, MD

## 2019-05-16 NOTE — Assessment & Plan Note (Signed)
   Continue omalizumab injections monthly, Symbicort 160/4.5 g 2 inhalations via spacer device twice a day, Spiriva Handihaler 18 g daily, montelukast 10 mg daily, and albuterol every 4-6 hours as needed.  Refill prescriptions have been provided for Spiriva and montelukast.  Subjective and objective measures of pulmonary function will be followed and the treatment plan will be adjusted accordingly.

## 2019-05-16 NOTE — Patient Instructions (Addendum)
Severe persistent asthma  Continue omalizumab injections monthly, Symbicort 160/4.5 g 2 inhalations via spacer device twice a day, Spiriva Handihaler 18 g daily, montelukast 10 mg daily, and albuterol every 4-6 hours as needed.  Refill prescriptions have been provided for Spiriva and montelukast.  Subjective and objective measures of pulmonary function will be followed and the treatment plan will be adjusted accordingly.  Allergic rhinitis Stable.  Continue appropriate allergen avoidance measures and immunotherapy injections as prescribed.  Continue montelukast 10 mg daily, fluticasone nasal spray daily as needed, and azelastine nasal spray as needed.  Nasal saline spray (i.e., Simply Saline) or nasal saline lavage (i.e., NeilMed) is recommended as needed and prior to medicated nasal sprays.  GERD (gastroesophageal reflux disease)  Continue reflux lifestyle modifications and omeprazole as prescribed.   Return in about 5 months (around 10/13/2019), or if symptoms worsen or fail to improve.

## 2019-05-16 NOTE — Assessment & Plan Note (Signed)
   Continue reflux lifestyle modifications and omeprazole as prescribed. 

## 2019-05-16 NOTE — Assessment & Plan Note (Signed)
Stable.  Continue appropriate allergen avoidance measures and immunotherapy injections as prescribed.  Continue montelukast 10 mg daily, fluticasone nasal spray daily as needed, and azelastine nasal spray as needed.  Nasal saline spray (i.e., Simply Saline) or nasal saline lavage (i.e., NeilMed) is recommended as needed and prior to medicated nasal sprays.

## 2019-05-23 ENCOUNTER — Ambulatory Visit (INDEPENDENT_AMBULATORY_CARE_PROVIDER_SITE_OTHER): Payer: BC Managed Care – PPO

## 2019-05-23 DIAGNOSIS — J309 Allergic rhinitis, unspecified: Secondary | ICD-10-CM

## 2019-05-24 DIAGNOSIS — J4550 Severe persistent asthma, uncomplicated: Secondary | ICD-10-CM | POA: Diagnosis not present

## 2019-05-25 ENCOUNTER — Other Ambulatory Visit: Payer: Self-pay

## 2019-05-25 ENCOUNTER — Ambulatory Visit (INDEPENDENT_AMBULATORY_CARE_PROVIDER_SITE_OTHER): Payer: BC Managed Care – PPO

## 2019-05-25 DIAGNOSIS — J4550 Severe persistent asthma, uncomplicated: Secondary | ICD-10-CM

## 2019-05-30 ENCOUNTER — Ambulatory Visit (INDEPENDENT_AMBULATORY_CARE_PROVIDER_SITE_OTHER): Payer: BC Managed Care – PPO

## 2019-05-30 DIAGNOSIS — J3089 Other allergic rhinitis: Secondary | ICD-10-CM

## 2019-06-06 ENCOUNTER — Ambulatory Visit (INDEPENDENT_AMBULATORY_CARE_PROVIDER_SITE_OTHER): Payer: BC Managed Care – PPO

## 2019-06-06 DIAGNOSIS — J3089 Other allergic rhinitis: Secondary | ICD-10-CM | POA: Diagnosis not present

## 2019-06-13 ENCOUNTER — Ambulatory Visit (INDEPENDENT_AMBULATORY_CARE_PROVIDER_SITE_OTHER): Payer: BC Managed Care – PPO

## 2019-06-13 DIAGNOSIS — J309 Allergic rhinitis, unspecified: Secondary | ICD-10-CM

## 2019-06-17 DIAGNOSIS — F419 Anxiety disorder, unspecified: Secondary | ICD-10-CM | POA: Diagnosis not present

## 2019-06-17 DIAGNOSIS — J0101 Acute recurrent maxillary sinusitis: Secondary | ICD-10-CM | POA: Diagnosis not present

## 2019-06-17 DIAGNOSIS — K219 Gastro-esophageal reflux disease without esophagitis: Secondary | ICD-10-CM | POA: Diagnosis not present

## 2019-06-17 DIAGNOSIS — R7303 Prediabetes: Secondary | ICD-10-CM | POA: Diagnosis not present

## 2019-06-17 DIAGNOSIS — I10 Essential (primary) hypertension: Secondary | ICD-10-CM | POA: Diagnosis not present

## 2019-06-20 ENCOUNTER — Ambulatory Visit (INDEPENDENT_AMBULATORY_CARE_PROVIDER_SITE_OTHER): Payer: BC Managed Care – PPO

## 2019-06-20 DIAGNOSIS — J309 Allergic rhinitis, unspecified: Secondary | ICD-10-CM | POA: Diagnosis not present

## 2019-06-21 DIAGNOSIS — J4550 Severe persistent asthma, uncomplicated: Secondary | ICD-10-CM | POA: Diagnosis not present

## 2019-06-21 DIAGNOSIS — Z01419 Encounter for gynecological examination (general) (routine) without abnormal findings: Secondary | ICD-10-CM | POA: Diagnosis not present

## 2019-06-21 DIAGNOSIS — Z6837 Body mass index (BMI) 37.0-37.9, adult: Secondary | ICD-10-CM | POA: Diagnosis not present

## 2019-06-21 DIAGNOSIS — Z1231 Encounter for screening mammogram for malignant neoplasm of breast: Secondary | ICD-10-CM | POA: Diagnosis not present

## 2019-06-22 ENCOUNTER — Ambulatory Visit (INDEPENDENT_AMBULATORY_CARE_PROVIDER_SITE_OTHER): Payer: BC Managed Care – PPO

## 2019-06-22 ENCOUNTER — Other Ambulatory Visit: Payer: Self-pay

## 2019-06-22 DIAGNOSIS — J4550 Severe persistent asthma, uncomplicated: Secondary | ICD-10-CM | POA: Diagnosis not present

## 2019-06-24 DIAGNOSIS — H40053 Ocular hypertension, bilateral: Secondary | ICD-10-CM | POA: Diagnosis not present

## 2019-06-28 ENCOUNTER — Ambulatory Visit (INDEPENDENT_AMBULATORY_CARE_PROVIDER_SITE_OTHER): Payer: BC Managed Care – PPO | Admitting: *Deleted

## 2019-06-28 DIAGNOSIS — J309 Allergic rhinitis, unspecified: Secondary | ICD-10-CM

## 2019-06-28 NOTE — Progress Notes (Signed)
VIAL EXP 06-27-20

## 2019-06-29 DIAGNOSIS — J3089 Other allergic rhinitis: Secondary | ICD-10-CM | POA: Diagnosis not present

## 2019-07-04 ENCOUNTER — Ambulatory Visit (INDEPENDENT_AMBULATORY_CARE_PROVIDER_SITE_OTHER): Payer: BC Managed Care – PPO

## 2019-07-04 DIAGNOSIS — J309 Allergic rhinitis, unspecified: Secondary | ICD-10-CM | POA: Diagnosis not present

## 2019-07-11 ENCOUNTER — Ambulatory Visit (INDEPENDENT_AMBULATORY_CARE_PROVIDER_SITE_OTHER): Payer: BC Managed Care – PPO

## 2019-07-11 DIAGNOSIS — J309 Allergic rhinitis, unspecified: Secondary | ICD-10-CM

## 2019-07-16 ENCOUNTER — Other Ambulatory Visit: Payer: Self-pay | Admitting: Allergy and Immunology

## 2019-07-16 DIAGNOSIS — J3089 Other allergic rhinitis: Secondary | ICD-10-CM

## 2019-07-18 ENCOUNTER — Ambulatory Visit (INDEPENDENT_AMBULATORY_CARE_PROVIDER_SITE_OTHER): Payer: BC Managed Care – PPO

## 2019-07-18 DIAGNOSIS — J309 Allergic rhinitis, unspecified: Secondary | ICD-10-CM | POA: Diagnosis not present

## 2019-07-18 DIAGNOSIS — J4550 Severe persistent asthma, uncomplicated: Secondary | ICD-10-CM | POA: Diagnosis not present

## 2019-07-19 DIAGNOSIS — J4550 Severe persistent asthma, uncomplicated: Secondary | ICD-10-CM | POA: Diagnosis not present

## 2019-07-20 ENCOUNTER — Ambulatory Visit (INDEPENDENT_AMBULATORY_CARE_PROVIDER_SITE_OTHER): Payer: BC Managed Care – PPO | Admitting: *Deleted

## 2019-07-20 ENCOUNTER — Other Ambulatory Visit: Payer: Self-pay

## 2019-07-20 DIAGNOSIS — J4550 Severe persistent asthma, uncomplicated: Secondary | ICD-10-CM

## 2019-07-26 ENCOUNTER — Ambulatory Visit (INDEPENDENT_AMBULATORY_CARE_PROVIDER_SITE_OTHER): Payer: BC Managed Care – PPO

## 2019-07-26 DIAGNOSIS — J309 Allergic rhinitis, unspecified: Secondary | ICD-10-CM

## 2019-07-29 DIAGNOSIS — H40053 Ocular hypertension, bilateral: Secondary | ICD-10-CM | POA: Diagnosis not present

## 2019-08-04 DIAGNOSIS — L570 Actinic keratosis: Secondary | ICD-10-CM | POA: Diagnosis not present

## 2019-08-04 DIAGNOSIS — L814 Other melanin hyperpigmentation: Secondary | ICD-10-CM | POA: Diagnosis not present

## 2019-08-04 DIAGNOSIS — L819 Disorder of pigmentation, unspecified: Secondary | ICD-10-CM | POA: Diagnosis not present

## 2019-08-08 ENCOUNTER — Ambulatory Visit (INDEPENDENT_AMBULATORY_CARE_PROVIDER_SITE_OTHER): Payer: BC Managed Care – PPO

## 2019-08-08 DIAGNOSIS — J309 Allergic rhinitis, unspecified: Secondary | ICD-10-CM | POA: Diagnosis not present

## 2019-08-15 ENCOUNTER — Ambulatory Visit (INDEPENDENT_AMBULATORY_CARE_PROVIDER_SITE_OTHER): Payer: BC Managed Care – PPO

## 2019-08-15 DIAGNOSIS — J309 Allergic rhinitis, unspecified: Secondary | ICD-10-CM

## 2019-08-16 DIAGNOSIS — J4550 Severe persistent asthma, uncomplicated: Secondary | ICD-10-CM

## 2019-08-17 ENCOUNTER — Ambulatory Visit (INDEPENDENT_AMBULATORY_CARE_PROVIDER_SITE_OTHER): Payer: BC Managed Care – PPO

## 2019-08-17 ENCOUNTER — Other Ambulatory Visit: Payer: Self-pay

## 2019-08-17 DIAGNOSIS — J4550 Severe persistent asthma, uncomplicated: Secondary | ICD-10-CM | POA: Diagnosis not present

## 2019-08-22 ENCOUNTER — Ambulatory Visit (INDEPENDENT_AMBULATORY_CARE_PROVIDER_SITE_OTHER): Payer: BC Managed Care – PPO

## 2019-08-22 DIAGNOSIS — J309 Allergic rhinitis, unspecified: Secondary | ICD-10-CM

## 2019-08-29 ENCOUNTER — Ambulatory Visit (INDEPENDENT_AMBULATORY_CARE_PROVIDER_SITE_OTHER): Payer: BC Managed Care – PPO

## 2019-08-29 DIAGNOSIS — J309 Allergic rhinitis, unspecified: Secondary | ICD-10-CM

## 2019-09-05 ENCOUNTER — Ambulatory Visit (INDEPENDENT_AMBULATORY_CARE_PROVIDER_SITE_OTHER): Payer: BC Managed Care – PPO

## 2019-09-05 DIAGNOSIS — J309 Allergic rhinitis, unspecified: Secondary | ICD-10-CM | POA: Diagnosis not present

## 2019-09-13 DIAGNOSIS — J4540 Moderate persistent asthma, uncomplicated: Secondary | ICD-10-CM | POA: Diagnosis not present

## 2019-09-14 ENCOUNTER — Ambulatory Visit (INDEPENDENT_AMBULATORY_CARE_PROVIDER_SITE_OTHER): Payer: BC Managed Care – PPO

## 2019-09-14 DIAGNOSIS — J4540 Moderate persistent asthma, uncomplicated: Secondary | ICD-10-CM | POA: Diagnosis not present

## 2019-09-19 ENCOUNTER — Ambulatory Visit (INDEPENDENT_AMBULATORY_CARE_PROVIDER_SITE_OTHER): Payer: BC Managed Care – PPO

## 2019-09-19 DIAGNOSIS — J309 Allergic rhinitis, unspecified: Secondary | ICD-10-CM | POA: Diagnosis not present

## 2019-09-20 ENCOUNTER — Other Ambulatory Visit: Payer: Self-pay | Admitting: Allergy and Immunology

## 2019-09-20 DIAGNOSIS — J3089 Other allergic rhinitis: Secondary | ICD-10-CM

## 2019-09-26 ENCOUNTER — Ambulatory Visit (INDEPENDENT_AMBULATORY_CARE_PROVIDER_SITE_OTHER): Payer: BC Managed Care – PPO

## 2019-09-26 DIAGNOSIS — J309 Allergic rhinitis, unspecified: Secondary | ICD-10-CM

## 2019-10-03 ENCOUNTER — Ambulatory Visit (INDEPENDENT_AMBULATORY_CARE_PROVIDER_SITE_OTHER): Payer: BC Managed Care – PPO

## 2019-10-03 DIAGNOSIS — J309 Allergic rhinitis, unspecified: Secondary | ICD-10-CM | POA: Diagnosis not present

## 2019-10-10 ENCOUNTER — Ambulatory Visit (INDEPENDENT_AMBULATORY_CARE_PROVIDER_SITE_OTHER): Payer: BC Managed Care – PPO

## 2019-10-10 DIAGNOSIS — J309 Allergic rhinitis, unspecified: Secondary | ICD-10-CM

## 2019-10-11 DIAGNOSIS — J4550 Severe persistent asthma, uncomplicated: Secondary | ICD-10-CM | POA: Diagnosis not present

## 2019-10-12 ENCOUNTER — Other Ambulatory Visit: Payer: Self-pay

## 2019-10-12 ENCOUNTER — Ambulatory Visit (INDEPENDENT_AMBULATORY_CARE_PROVIDER_SITE_OTHER): Payer: BC Managed Care – PPO

## 2019-10-12 DIAGNOSIS — J4550 Severe persistent asthma, uncomplicated: Secondary | ICD-10-CM

## 2019-10-18 ENCOUNTER — Ambulatory Visit (INDEPENDENT_AMBULATORY_CARE_PROVIDER_SITE_OTHER): Payer: BC Managed Care – PPO

## 2019-10-18 DIAGNOSIS — J309 Allergic rhinitis, unspecified: Secondary | ICD-10-CM

## 2019-10-19 ENCOUNTER — Other Ambulatory Visit: Payer: Self-pay

## 2019-10-19 ENCOUNTER — Ambulatory Visit: Payer: BC Managed Care – PPO | Admitting: Podiatry

## 2019-10-19 ENCOUNTER — Encounter: Payer: Self-pay | Admitting: Podiatry

## 2019-10-19 DIAGNOSIS — L603 Nail dystrophy: Secondary | ICD-10-CM | POA: Diagnosis not present

## 2019-10-19 DIAGNOSIS — L608 Other nail disorders: Secondary | ICD-10-CM | POA: Diagnosis not present

## 2019-10-19 DIAGNOSIS — L309 Dermatitis, unspecified: Secondary | ICD-10-CM | POA: Insufficient documentation

## 2019-10-19 HISTORY — DX: Dermatitis, unspecified: L30.9

## 2019-10-19 NOTE — Progress Notes (Signed)
Subjective:  Patient ID: Melanie Armstrong, female    DOB: 02-15-1956,  MRN: [id number] HPI Chief Complaint  Patient presents with  . Nail Problem    Hallux right - toenail thick, discolored, cracked at lateral border x couple months, no injury, no treatment  . New Patient (Initial Visit)    64 y.o. female presents with the above complaint.   ROS: Denies fever chills nausea vomiting muscle aches pains calf pain back pain chest pain shortness of breath.  Past Medical History:  Diagnosis Date  . Arthritis   . Asthma   . GERD (gastroesophageal reflux disease)   . Heart murmur    as a child   . Hypertension    Past Surgical History:  Procedure Laterality Date  . CHOLECYSTECTOMY    . TONSILLECTOMY    . TOTAL HIP ARTHROPLASTY Right 08/22/2014   Procedure: RIGHT TOTAL HIP ARTHROPLASTY ANTERIOR APPROACH;  Surgeon: Paralee Cancel, MD;  Location: [facility];  Service: Orthopedics;  Laterality: Right;    Current Outpatient Medications:  .  albuterol (PROAIR HFA) 108 (90 Base) MCG/ACT inhaler, Inhale 2 puffs into the lungs every 4 (four) hours as needed., Disp: 18 g, Rfl: 1 .  albuterol (PROVENTIL) (2.5 MG/3ML) 0.083% nebulizer solution, USE 1 VIAL IN NEBULIZER EVERY 4 HOURS AS NEEDED FOR WHEEZING OR  SHORTNESS  OF  BREATH, Disp: 600 mL, Rfl: 0 .  Azelastine HCl 0.15 % SOLN, Place 2 sprays into both nostrils 2 (two) times daily., Disp: 30 mL, Rfl: 5 .  budesonide-formoterol (SYMBICORT) 160-4.5 MCG/ACT inhaler, Inhale 2 puffs into the lungs 2 (two) times daily., Disp: 1 Inhaler, Rfl: 5 .  diazepam (VALIUM) 5 MG tablet, Take 5 mg by mouth every 6 (six) hours as needed for anxiety., Disp: , Rfl:  .  fexofenadine (ALLEGRA) 180 MG tablet, Take 180 mg by mouth daily., Disp: , Rfl:  .  fluticasone (FLONASE) 50 MCG/ACT nasal spray, PLACE 1-2 SPRAYS INTO BOTH NOSTRILS DAILY AS NEEDED FOR ALLERGIES OR RHINITIS., Disp: 16 mL, Rfl: 2 .  furosemide (LASIX) 20 MG tablet, Take 20 mg by mouth every morning., Disp:  , Rfl:  .  irbesartan (AVAPRO) 150 MG tablet, Take 150 mg by mouth daily., Disp: , Rfl:  .  montelukast (SINGULAIR) 10 MG tablet, Take 1 tablet (10 mg total) by mouth at bedtime., Disp: 30 tablet, Rfl: 5 .  omeprazole (PRILOSEC) 20 MG capsule, Take 1 capsule (20 mg total) by mouth daily., Disp: 30 capsule, Rfl: 5 .  tiotropium (SPIRIVA HANDIHALER) 18 MCG inhalation capsule, Place 1 capsule (18 mcg total) into inhaler and inhale 1 day or 1 dose., Disp: 30 capsule, Rfl: 5 .  Tiotropium Bromide Monohydrate (SPIRIVA RESPIMAT) 1.25 MCG/ACT AERS, Inhale 2 puffs into the lungs daily., Disp: 4 g, Rfl: 5 .  calcium-vitamin D (OSCAL WITH D) 500-200 MG-UNIT per tablet, Take 1 tablet by mouth daily with breakfast., Disp: , Rfl:  .  dorzolamide (TRUSOPT) 2 % ophthalmic solution, 1 drop 2 (two) times daily., Disp: , Rfl:  .  EPINEPHrine (EPIPEN 2-PAK) 0.3 mg/0.3 mL IJ SOAJ injection, USE AS DIRECTED FOR SEVERE ALLERGIC REACTION, Disp: 2 Device, Rfl: 1 .  glucosamine-chondroitin 500-400 MG tablet, Take 1 tablet by mouth 2 (two) times daily., Disp: , Rfl:  .  Multiple Vitamin (MULTIVITAMIN WITH MINERALS) TABS tablet, Take 1 tablet by mouth daily., Disp: , Rfl:  .  Omega-3 Fatty Acids (OMEGA 3 PO), Take 1 capsule by mouth daily., Disp: , Rfl:  .  PRESCRIPTION MEDICATION, once a week., Disp: , Rfl:  .  vitamin C (ASCORBIC ACID) 500 MG tablet, Take 500 mg by mouth daily., Disp: , Rfl:  .  XOLAIR 150 MG injection, INJECT 300 MG SUBCUTANEOUSLY EVERY 4 WEEKS -REFRIGERATE DO NOT FREEZE, Disp: 6 each, Rfl: 3  Current Facility-Administered Medications:  .  omalizumab Arvid Right) injection 300 mg, 300 mg, Subcutaneous, Q28 days, Kozlow, Donnamarie Poag, MD, 300 mg at 10/12/19 1131  Allergies  Allergen Reactions  . Advair Diskus [Fluticasone-Salmeterol]     Per patient, she thinks she developed walking PNA.   Marland Kitchen Avelox [Moxifloxacin Hcl In Nacl] Other (See Comments)    Muscle Aches  . Levofloxacin Other (See Comments)    Tendon  pain  . Protonix [Pantoprazole Sodium] Diarrhea   Review of Systems Objective:  There were no vitals filed for this visit.  General: Well developed, nourished, in no acute distress, alert and oriented x3   Dermatological: Skin is warm, dry and supple bilateral. Nails x 10 are well maintained; remaining integument appears unremarkable at this time. There are no open sores, no preulcerative lesions, no rash or signs of infection present.  Hallux right does demonstrate mild discoloration with thickening and subungual debris along the lateral border.  Vascular: Dorsalis Pedis artery and Posterior Tibial artery pedal pulses are 2/4 bilateral with immedate capillary fill time. Pedal hair growth present. No varicosities and no lower extremity edema present bilateral.   Neruologic: Grossly intact via light touch bilateral. Vibratory intact via tuning fork bilateral. Protective threshold with Semmes Wienstein monofilament intact to all pedal sites bilateral. Patellar and Achilles deep tendon reflexes 2+ bilateral. No Babinski or clonus noted bilateral.   Musculoskeletal: No gross boney pedal deformities bilateral. No pain, crepitus, or limitation noted with foot and ankle range of motion bilateral. Muscular strength 5/5 in all groups tested bilateral.  Gait: Unassisted, Nonantalgic.    Radiographs:  None taken  Assessment & Plan:   Assessment: Nail dystrophy cannot rule out onychomycosis hallux right  Plan: Sample of the skin and nail were taken today for pathologic evaluation.     Mariangela Heldt T. [place], [place]

## 2019-10-20 ENCOUNTER — Telehealth: Payer: Self-pay | Admitting: Internal Medicine

## 2019-10-20 NOTE — Telephone Encounter (Signed)
I had pt's CT order pulled.  I scheduled CT and called pt with information.  Nothing further needed.

## 2019-10-24 ENCOUNTER — Encounter: Payer: Self-pay | Admitting: Allergy

## 2019-10-24 ENCOUNTER — Ambulatory Visit: Payer: BC Managed Care – PPO | Admitting: Allergy and Immunology

## 2019-10-24 ENCOUNTER — Other Ambulatory Visit: Payer: Self-pay

## 2019-10-24 ENCOUNTER — Ambulatory Visit (INDEPENDENT_AMBULATORY_CARE_PROVIDER_SITE_OTHER): Payer: BC Managed Care – PPO | Admitting: Allergy

## 2019-10-24 VITALS — BP 110/68 | HR 82 | Temp 98.2°F | Resp 16 | Ht 62.6 in | Wt 230.2 lb

## 2019-10-24 DIAGNOSIS — K219 Gastro-esophageal reflux disease without esophagitis: Secondary | ICD-10-CM

## 2019-10-24 DIAGNOSIS — J309 Allergic rhinitis, unspecified: Secondary | ICD-10-CM

## 2019-10-24 DIAGNOSIS — J4550 Severe persistent asthma, uncomplicated: Secondary | ICD-10-CM | POA: Diagnosis not present

## 2019-10-24 DIAGNOSIS — J3089 Other allergic rhinitis: Secondary | ICD-10-CM

## 2019-10-24 MED ORDER — SPIRIVA HANDIHALER 18 MCG IN CAPS: 18.0000 ug | ORAL_CAPSULE | RESPIRATORY_TRACT | 5 refills | Status: DC

## 2019-10-24 NOTE — Progress Notes (Signed)
Follow Up Note  RE: Melanie BOUTELLE MRN: [id number] DOB: [birth year] Date of Office Visit: 10/24/2019  Referring provider: [name], MD Primary care provider: [name], MD  Chief Complaint: Asthma and Allergic Rhinitis   History of Present Illness: I had the pleasure of seeing Melanie Armstrong for a follow up visit at the [facility] on 10/24/2019. She is a 64 y.o. female, who is being followed for asthma, allergic rhinitis on allergy immunotherapy, GERD. Her previous allergy office visit was on 05/16/2019 with Dr. Verlin Fester. Today is a regular follow up visit. Up to date with COVID-19 vaccine: yes  Severe persistent asthma Patient is working at home and noticing some shortness of breath and chest tightness with weather changes. Uses albuterol at times - at most twice a week with good benefit.  Other triggers include strong scents which sometimes happens when she is working in the office. She has been working from home due to COVID-19 but she will be going back to work in person full time after September.  Currently on Xolair 300mg  SQ every 4 weeks with good benefit. Takes Symbicort 151mcg 2 puffs twice a day with space spacer, Spiriva daily.  Allergic rhinitis Currently on weekly allergy injections with good benefit. Patient notices some nasal congestion when due/late for injection.  Taking allegra daily. Takes azelastine 1 spray at QHS and Flonase 2 sprays per nostril QAM with good benefit.   GERD (gastroesophageal reflux disease) Takes omeprazole 20mg  in the AM with good benefit.   Assessment and Plan: Melanie Armstrong is a 64 y.o. female with: Severe persistent asthma, uncomplicated Stable with below regimen. Using albuterol at most twice a week. Triggers are weather changes and strong scents.   ACT score 24.  Today's spirometry was normal. . Daily controller medication(s): continue with Symbicort 165mcg 2 puffs twice a day with spacer and rinse mouth  afterwards. o Continue Spiriva daily. Check price for Spiriva Respimat.  o Continue montelukast 10mg  daily. o Continue Xolair 300mg  every 4 weeks.  . May use albuterol rescue inhaler 2 puffs every 4 to 6 hours as needed for shortness of breath, chest tightness, coughing, and wheezing. May use albuterol rescue inhaler 2 puffs 5 to 15 minutes prior to strenuous physical activities. Monitor frequency of use.   Allergic rhinitis Stable with below regimen. Prefers weekly injections as notices some nasal congestion if late/due for injection still.   Continue allergy injections.   Will discuss at next visit moving to every 2 weeks.   Continue montelukast 10mg  daily and allegra 180mg  daily.   May use azelastine 1 spray per nostril at night.  Continue with Flonase 2 spray per nostril in the morning.   Gastroesophageal reflux disease Stable.  Continue with omeprazole 20mg  in the morning.  Return in about 4 months (around 02/24/2020).  Meds ordered this encounter  Medications  . tiotropium (SPIRIVA HANDIHALER) 18 MCG inhalation capsule    Sig: Place 1 capsule (18 mcg total) into inhaler and inhale 1 day or 1 dose.    Dispense:  30 capsule    Refill:  5   Diagnostics: Spirometry:  Tracings reviewed. Her effort: It was hard to get consistent efforts and there is a question as to whether this reflects a maximal maneuver. FVC: 2.81L FEV1: 2.38L, 106% predicted FEV1/FVC ratio: 85% Interpretation: Spirometry consistent with normal pattern.  Please see scanned spirometry results for details.  Medication List:  Current Outpatient Medications  Medication Sig Dispense Refill  . albuterol (  PROAIR HFA) 108 (90 Base) MCG/ACT inhaler Inhale 2 puffs into the lungs every 4 (four) hours as needed. 18 g 1  . albuterol (PROVENTIL) (2.5 MG/3ML) 0.083% nebulizer solution USE 1 VIAL IN NEBULIZER EVERY 4 HOURS AS NEEDED FOR WHEEZING OR  SHORTNESS  OF  BREATH 600 mL 0  . Azelastine HCl 0.15 % SOLN Place  2 sprays into both nostrils 2 (two) times daily. 30 mL 5  . budesonide-formoterol (SYMBICORT) 160-4.5 MCG/ACT inhaler Inhale 2 puffs into the lungs 2 (two) times daily. 1 Inhaler 5  . calcium-vitamin D (OSCAL WITH D) 500-200 MG-UNIT per tablet Take 1 tablet by mouth daily with breakfast.    . diazepam (VALIUM) 5 MG tablet Take 5 mg by mouth every 6 (six) hours as needed for anxiety.    . dorzolamide (TRUSOPT) 2 % ophthalmic solution 1 drop 2 (two) times daily.    Marland Kitchen EPINEPHrine (EPIPEN 2-PAK) 0.3 mg/0.3 mL IJ SOAJ injection USE AS DIRECTED FOR SEVERE ALLERGIC REACTION 2 Device 1  . fexofenadine (ALLEGRA) 180 MG tablet Take 180 mg by mouth daily.    . fluticasone (FLONASE) 50 MCG/ACT nasal spray PLACE 1-2 SPRAYS INTO BOTH NOSTRILS DAILY AS NEEDED FOR ALLERGIES OR RHINITIS. 16 mL 2  . furosemide (LASIX) 20 MG tablet Take 20 mg by mouth every morning.    Marland Kitchen glucosamine-chondroitin 500-400 MG tablet Take 1 tablet by mouth 2 (two) times daily.    . irbesartan (AVAPRO) 150 MG tablet Take 150 mg by mouth daily.    . montelukast (SINGULAIR) 10 MG tablet Take 1 tablet (10 mg total) by mouth at bedtime. 30 tablet 5  . Multiple Vitamin (MULTIVITAMIN WITH MINERALS) TABS tablet Take 1 tablet by mouth daily.    . Omega-3 Fatty Acids (OMEGA 3 PO) Take 1 capsule by mouth daily.    Marland Kitchen omeprazole (PRILOSEC) 20 MG capsule Take 1 capsule (20 mg total) by mouth daily. 30 capsule 5  . PRESCRIPTION MEDICATION once a week.    . tiotropium (SPIRIVA HANDIHALER) 18 MCG inhalation capsule Place 1 capsule (18 mcg total) into inhaler and inhale 1 day or 1 dose. 30 capsule 5  . Tiotropium Bromide Monohydrate (SPIRIVA RESPIMAT) 1.25 MCG/ACT AERS Inhale 2 puffs into the lungs daily. 4 g 5  . vitamin C (ASCORBIC ACID) 500 MG tablet Take 500 mg by mouth daily.    Arvid Right 150 MG injection INJECT 300 MG SUBCUTANEOUSLY EVERY 4 WEEKS -REFRIGERATE DO NOT FREEZE 6 each 3   Current Facility-Administered Medications  Medication Dose Route  Frequency Provider Last Rate Last Admin  . omalizumab Arvid Right) injection 300 mg  300 mg Subcutaneous Q28 days Jiles Prows, MD   300 mg at 10/12/19 1131   Allergies: Allergies  Allergen Reactions  . Advair Diskus [Fluticasone-Salmeterol]     Per patient, she thinks she developed walking PNA.   Marland Kitchen Avelox [Moxifloxacin Hcl In Nacl] Other (See Comments)    Muscle Aches  . Levofloxacin Other (See Comments)    Tendon pain  . Protonix [Pantoprazole Sodium] Diarrhea   I reviewed her past medical history, social history, family history, and environmental history and no significant changes have been reported from her previous visit.  Review of Systems  Constitutional: Negative for appetite change, chills, fever and unexpected weight change.  HENT: Negative for congestion and rhinorrhea.   Eyes: Negative for itching.  Respiratory: Negative for cough, chest tightness, shortness of breath and wheezing.   Gastrointestinal: Negative for abdominal pain.  Skin: Negative  for rash.  Allergic/Immunologic: Positive for environmental allergies.  Neurological: Negative for headaches.   Objective: BP 110/68   Pulse 82   Temp 98.2 F (36.8 C) (Temporal)   Resp 16   Ht 5' 2.6" (1.59 m)   Wt 230 lb 3.2 oz (104.4 kg)   SpO2 97%   BMI 41.30 kg/m  Body mass index is 41.3 kg/m. Physical Exam Vitals and nursing note reviewed.  Constitutional:      Appearance: Normal appearance. She is well-developed.  HENT:     Head: Normocephalic and atraumatic.     Right Ear: Tympanic membrane and external ear normal.     Left Ear: Tympanic membrane and external ear normal.     Nose: Nose normal. No congestion or rhinorrhea.     Mouth/Throat:     Mouth: Mucous membranes are moist.     Pharynx: Oropharynx is clear.  Eyes:     Conjunctiva/sclera: Conjunctivae normal.  Cardiovascular:     Rate and Rhythm: Normal rate and regular rhythm.     Heart sounds: Normal heart sounds. No murmur heard.   Pulmonary:      Effort: Pulmonary effort is normal.     Breath sounds: Normal breath sounds. No wheezing, rhonchi or rales.  Musculoskeletal:     Cervical back: Neck supple.  Skin:    General: Skin is warm.     Findings: No rash.  Neurological:     Mental Status: She is alert and oriented to person, place, and time.  Psychiatric:        Behavior: Behavior normal.    Previous notes and tests were reviewed. The plan was reviewed with the patient/family, and all questions/concerned were addressed.  It was my pleasure to see Melanie Armstrong today and participate in her care. Please feel free to contact me with any questions or concerns.  Sincerely,  Rexene Alberts, DO Allergy & Immunology  [facility] office: [phone] [facility] office: [place] office: [phone]

## 2019-10-24 NOTE — Assessment & Plan Note (Signed)
Stable with below regimen. Using albuterol at most twice a week. Triggers are weather changes and strong scents.   ACT score 24.  Today's spirometry was normal. . Daily controller medication(s): continue with Symbicort 114mcg 2 puffs twice a day with spacer and rinse mouth afterwards. o Continue Spiriva daily. Check price for Spiriva Respimat.  o Continue montelukast 10mg  daily. o Continue Xolair 300mg  every 4 weeks.  . May use albuterol rescue inhaler 2 puffs every 4 to 6 hours as needed for shortness of breath, chest tightness, coughing, and wheezing. May use albuterol rescue inhaler 2 puffs 5 to 15 minutes prior to strenuous physical activities. Monitor frequency of use.

## 2019-10-24 NOTE — Patient Instructions (Addendum)
Asthma: . Daily controller medication(s): continue with Symbicort 168mcg 2 puffs twice a day with spacer and rinse mouth afterwards. o Continue Spiriva daily - check pricing which is cheaper. Other option is Spiriva Respimat.  o Continue montelukast 10mg  daily. o Continue Xolair 300mg  every 4 weeks.  . May use albuterol rescue inhaler 2 puffs every 4 to 6 hours as needed for shortness of breath, chest tightness, coughing, and wheezing. May use albuterol rescue inhaler 2 puffs 5 to 15 minutes prior to strenuous physical activities. Monitor frequency of use.  . Asthma control goals:  o Full participation in all desired activities (may need albuterol before activity) o Albuterol use two times or less a week on average (not counting use with activity) o Cough interfering with sleep two times or less a month o Oral steroids no more than once a year o No hospitalizations  Allergic rhinitis  Continue allergy injections.   Continue montelukast 10mg  daily and allegra 180mg  daily.   May use azelastine 1 spray per nostril at night.  Continue with Flonase 2 spray per nostril in the morning  Reflux  Continue with omeprazole 20mg  in the morning.  Follow up in 4 months or sooner if needed.

## 2019-10-24 NOTE — Assessment & Plan Note (Signed)
Stable.  Continue with omeprazole 20mg  in the morning.

## 2019-10-24 NOTE — Progress Notes (Signed)
Vial exp 10-23-20

## 2019-10-24 NOTE — Assessment & Plan Note (Signed)
Stable with below regimen. Prefers weekly injections as notices some nasal congestion if late/due for injection still.   Continue allergy injections.   Will discuss at next visit moving to every 2 weeks.   Continue montelukast 10mg  daily and allegra 180mg  daily.   May use azelastine 1 spray per nostril at night.  Continue with Flonase 2 spray per nostril in the morning.

## 2019-10-25 DIAGNOSIS — J3089 Other allergic rhinitis: Secondary | ICD-10-CM

## 2019-10-31 ENCOUNTER — Ambulatory Visit (INDEPENDENT_AMBULATORY_CARE_PROVIDER_SITE_OTHER): Payer: BC Managed Care – PPO

## 2019-10-31 DIAGNOSIS — J309 Allergic rhinitis, unspecified: Secondary | ICD-10-CM

## 2019-11-03 DIAGNOSIS — L578 Other skin changes due to chronic exposure to nonionizing radiation: Secondary | ICD-10-CM | POA: Diagnosis not present

## 2019-11-07 ENCOUNTER — Ambulatory Visit (INDEPENDENT_AMBULATORY_CARE_PROVIDER_SITE_OTHER): Payer: BC Managed Care – PPO

## 2019-11-07 DIAGNOSIS — J309 Allergic rhinitis, unspecified: Secondary | ICD-10-CM

## 2019-11-08 DIAGNOSIS — J4550 Severe persistent asthma, uncomplicated: Secondary | ICD-10-CM | POA: Diagnosis not present

## 2019-11-09 ENCOUNTER — Other Ambulatory Visit: Payer: Self-pay

## 2019-11-09 ENCOUNTER — Ambulatory Visit (INDEPENDENT_AMBULATORY_CARE_PROVIDER_SITE_OTHER): Payer: BC Managed Care – PPO

## 2019-11-09 DIAGNOSIS — J4550 Severe persistent asthma, uncomplicated: Secondary | ICD-10-CM

## 2019-11-11 DIAGNOSIS — H40053 Ocular hypertension, bilateral: Secondary | ICD-10-CM | POA: Diagnosis not present

## 2019-11-13 DIAGNOSIS — L03115 Cellulitis of right lower limb: Secondary | ICD-10-CM | POA: Diagnosis not present

## 2019-11-22 ENCOUNTER — Telehealth: Payer: Self-pay

## 2019-11-22 NOTE — Telephone Encounter (Signed)
Patient has been notified of her nail biopsy results.  She prefers to cancel her appt for tomorrow and will call back if any problems arise again.

## 2019-11-22 NOTE — Telephone Encounter (Signed)
-----   Message from Garrel Ridgel, [place] sent at 10/26/2019 11:41 AM EDT ----- Negative for fungus.  I don't need a follow up for her unless she wants to remove nails.  Otherwise keep them short and thin.

## 2019-11-23 ENCOUNTER — Ambulatory Visit: Payer: BC Managed Care – PPO | Admitting: Podiatry

## 2019-11-28 ENCOUNTER — Other Ambulatory Visit: Payer: Self-pay

## 2019-11-28 ENCOUNTER — Ambulatory Visit (INDEPENDENT_AMBULATORY_CARE_PROVIDER_SITE_OTHER)
Admission: RE | Admit: 2019-11-28 | Discharge: 2019-11-28 | Disposition: A | Discharge status: Home / Self Care | Payer: BC Managed Care – PPO | Source: Ambulatory Visit | Attending: Internal Medicine | Admitting: Internal Medicine

## 2019-11-28 DIAGNOSIS — I700 Atherosclerosis of aorta: Secondary | ICD-10-CM | POA: Diagnosis not present

## 2019-11-28 DIAGNOSIS — R911 Solitary pulmonary nodule: Secondary | ICD-10-CM

## 2019-11-28 DIAGNOSIS — I2510 Atherosclerotic heart disease of native coronary artery without angina pectoris: Secondary | ICD-10-CM | POA: Diagnosis not present

## 2019-11-28 DIAGNOSIS — K449 Diaphragmatic hernia without obstruction or gangrene: Secondary | ICD-10-CM | POA: Diagnosis not present

## 2019-11-28 DIAGNOSIS — R918 Other nonspecific abnormal finding of lung field: Secondary | ICD-10-CM | POA: Diagnosis not present

## 2019-11-29 NOTE — Progress Notes (Signed)
Nodules are stable x 2 years but still needs followuo. There are other findings such as co art calcification and hiatal hernia. Will discuss in sept followup

## 2019-12-01 DIAGNOSIS — L03115 Cellulitis of right lower limb: Secondary | ICD-10-CM | POA: Diagnosis not present

## 2019-12-01 DIAGNOSIS — I10 Essential (primary) hypertension: Secondary | ICD-10-CM | POA: Diagnosis not present

## 2019-12-07 ENCOUNTER — Ambulatory Visit: Payer: Self-pay

## 2019-12-12 ENCOUNTER — Ambulatory Visit (INDEPENDENT_AMBULATORY_CARE_PROVIDER_SITE_OTHER): Payer: BC Managed Care – PPO

## 2019-12-12 DIAGNOSIS — J309 Allergic rhinitis, unspecified: Secondary | ICD-10-CM | POA: Diagnosis not present

## 2019-12-13 DIAGNOSIS — J4550 Severe persistent asthma, uncomplicated: Secondary | ICD-10-CM | POA: Diagnosis not present

## 2019-12-14 ENCOUNTER — Other Ambulatory Visit: Payer: Self-pay

## 2019-12-14 ENCOUNTER — Ambulatory Visit (INDEPENDENT_AMBULATORY_CARE_PROVIDER_SITE_OTHER): Payer: BC Managed Care – PPO

## 2019-12-14 DIAGNOSIS — J4550 Severe persistent asthma, uncomplicated: Secondary | ICD-10-CM | POA: Diagnosis not present

## 2019-12-16 ENCOUNTER — Other Ambulatory Visit: Payer: Self-pay

## 2019-12-16 ENCOUNTER — Other Ambulatory Visit: Payer: Self-pay | Admitting: Allergy and Immunology

## 2019-12-16 DIAGNOSIS — J3089 Other allergic rhinitis: Secondary | ICD-10-CM

## 2019-12-20 ENCOUNTER — Ambulatory Visit (INDEPENDENT_AMBULATORY_CARE_PROVIDER_SITE_OTHER): Payer: BC Managed Care – PPO

## 2019-12-20 DIAGNOSIS — J309 Allergic rhinitis, unspecified: Secondary | ICD-10-CM | POA: Diagnosis not present

## 2019-12-23 DIAGNOSIS — F419 Anxiety disorder, unspecified: Secondary | ICD-10-CM | POA: Diagnosis not present

## 2019-12-23 DIAGNOSIS — Z23 Encounter for immunization: Secondary | ICD-10-CM | POA: Diagnosis not present

## 2019-12-23 DIAGNOSIS — I10 Essential (primary) hypertension: Secondary | ICD-10-CM | POA: Diagnosis not present

## 2019-12-23 DIAGNOSIS — K219 Gastro-esophageal reflux disease without esophagitis: Secondary | ICD-10-CM | POA: Diagnosis not present

## 2019-12-23 DIAGNOSIS — R7303 Prediabetes: Secondary | ICD-10-CM | POA: Diagnosis not present

## 2019-12-26 ENCOUNTER — Encounter: Payer: Self-pay | Admitting: Internal Medicine

## 2019-12-26 ENCOUNTER — Other Ambulatory Visit: Payer: Self-pay

## 2019-12-26 ENCOUNTER — Ambulatory Visit (INDEPENDENT_AMBULATORY_CARE_PROVIDER_SITE_OTHER): Payer: BC Managed Care – PPO

## 2019-12-26 ENCOUNTER — Ambulatory Visit: Payer: BC Managed Care – PPO | Admitting: Internal Medicine

## 2019-12-26 VITALS — BP 124/86 | HR 94 | Temp 97.5°F | Ht 63.0 in | Wt 234.0 lb

## 2019-12-26 DIAGNOSIS — Z8709 Personal history of other diseases of the respiratory system: Secondary | ICD-10-CM

## 2019-12-26 DIAGNOSIS — Q859 Phakomatosis, unspecified: Secondary | ICD-10-CM

## 2019-12-26 DIAGNOSIS — R911 Solitary pulmonary nodule: Secondary | ICD-10-CM

## 2019-12-26 DIAGNOSIS — J309 Allergic rhinitis, unspecified: Secondary | ICD-10-CM | POA: Diagnosis not present

## 2019-12-26 NOTE — Patient Instructions (Addendum)
ICD-10-CM   1. Nodule of lower lobe of left lung  R91.1   2. History of asthma  Z87.09   3. Pulmonary hamartoma ([place])  Q85.9      Nodule of posterior lower lobe of left lung 60mm-9mm in CT may 10,2019 - > no change Aug 2019 -> July 2020 -> Aug 2021 - low prob for malignancy but radiology concerned for spiculation  Plan  - do repeat CT chest without contrast in mid Sept 2022 - Oct 2022    Pulmonary hamartoma [facility]) -1.9cm  - this is seen in left lung lower part and stable in CT scan May 2019 and Aug 2019 and July 2020 and  Aug 2021  plan   no active folllowup  History of asthma  Well controlled. Respect decision not to taper spiriva or symbicoprt and xolair  Plan - per allergist     Followup Sept 2022  after CT chest

## 2019-12-26 NOTE — Progress Notes (Signed)
PCP [name], MD  HPI  IOV 09/08/2017  Chief Complaint  Patient presents with   Pulm Consult    Referred by Dr. Veverly Fells for left pulmonary nodules. Per patient, has 2 nodules on the left. Patient has a history of asthma and SOB.      Melanie Armstrong is a 64 year old non-smoker who works on a desk job doing Microbiologist.  She is here referred for left lung nodules.  The background history is that she has been on lisinopril for 15 years.  She carries a diagnosis of asthma allergic that is managed by our local allergist.  She tells me that she is been on Symbicort for at least 10 years and Spiriva for at least 5 years and the last couple of years Xolair.  She describes herself as severe persistent asthma.  Her recent spirometry May 2019 personal visualization shows no normal FEV1 and FVC.  Earlier in spring 2019 she had respiratory infection following exposure to sick contacts at work.  Since then she has had significant cough and bronchitis requiring at least 2 rounds of prednisone and antibiotics.  Things are improving and currently the cough is rated as a 3 out of 10.  She tells me as part of this persistent bronchitic cough symptoms she did have a chest x-ray that showed left lung nodule that then resulted in the CT scan of the chest Aug 20, 2017 that shows a 1.7 cm left lung nodule that is dense and I personally visualized and agree with the radiologist interpretation that this is a hamartoma.  There is another 9 mm subpleural left lower lobe posterior segment nodule and that the main reason for the referral today.  The CT scan also shows coronary artery calcification but she denies any chest pain.  She is a non-smoker.  Although when she was a kid her parents smoked.   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  Indeterminate 9 mm pulmonary nodule in posterior left lower  lobe. Consider one of the following in 3 months for both low-risk and high-risk individuals: (a) repeat chest CT, (b) follow-up PET-CT, or (c) tissue sampling. This recommendation follows the consensus statement: Guidelines for Management of Incidental Pulmonary Nodules Detected on CT Images: From the Fleischner Society 2017; Radiology 2017; 284:228-243.  Incidental findings: Aortic Atherosclerosis (ICD10-I70.0). Coronary artery calcification. Tiny hiatal hernia. Small benign right adrenal adenoma.   Electronically Signed   By: Earle Gell M.D.   On: 08/21/2017 10:50  OV 11/30/2017  Chief Complaint  Patient presents with   Follow-up    CT 8/16.  Pt also had cards referral 8/9. Pt states she has been doing well since last visit and denies any complaints.     Follow-up lung nodules -  9 mm left lower lobe in the setting of stable 1.7 cm benign left lower lobe hamartoma  -  history of asthma followed by allergist.  Is a routine follow-up. She had a 3 month CT scan of the chest that is documented below. The 9 mm left lower lobe nodule is stable but in May 2019 and August 2019. The left lower lobe, was also stable. In terms of asthma she follows with an allergist. She is on ACE inhibitor but she says she has no cough and she does not feel a need for medication to be discontinued.   Ct Chest Wo Contrast  Result  Date: 11/27/2017 CLINICAL DATA:  Pulmonary nodule follow-up. EXAM: CT CHEST WITHOUT CONTRAST TECHNIQUE: Multidetector CT imaging of the chest was performed following the standard protocol without IV contrast. COMPARISON:  08/20/2017 FINDINGS: Cardiovascular: The heart size is normal. No substantial pericardial effusion. Coronary artery calcification is evident. Atherosclerotic calcification is noted in the wall of the thoracic aorta. Mediastinum/Nodes: No mediastinal lymphadenopathy. No evidence for gross hilar lymphadenopathy although assessment is limited by the lack of  intravenous contrast on today's study. The esophagus has normal imaging features. There is no axillary lymphadenopathy. Lungs/Pleura: The central tracheobronchial airways are patent. Scattered peripheral tiny pulmonary nodules are similar to prior. 1.7 cm nodule left lower lobe contains central fat density consistent with benign hamartoma. 9 mm peripheral left lower lobe nodule seen on the prior study persists at 9 mm today (3:109). No pleural effusion. Upper Abdomen: Stable 15 mm right adrenal adenoma. Musculoskeletal: No worrisome lytic or sclerotic osseous abnormality. IMPRESSION: 1. 9 mm left lower lobe pulmonary nodule is stable in the interval. Three-month stable imaging follow-up is reassuring. Repeat CT chest without contrast in 6-12 months recommended to ensure continued stability. 2. Stable 1.7 cm benign pulmonary hamartoma. 3. No change scattered tiny peripheral nodules in both lungs. 4.  Aortic Atherosclerois (ICD10-170.0) Electronically Signed   By: Misty Stanley M.D.   On: 11/27/2017 16:11   OV 12/13/2018  Subjective:  Patient ID: Melanie Armstrong, female , DOB: 1955-06-04 , age 21 y.o. , MRN: [id number] , ADDRESS: [place] [address]   12/13/2018 -   Chief Complaint  Patient presents with   Nodule of lower lobe of left lung    Follow-up lung nodules -  9 mm left lower lobe in the setting of separate stable 1.7 cm benign left lower lobe hamartoma  - Hx of  history of asthma followed by allergist. - nasal inhalers, symbicort, spiriva, xolair - Also on ace inhibitors without cough   HPI Melanie Armstrong 64 y.o. -is now over 1 year since  last saw [name].  Overall she is stable and doing well.  No major issues in the interim.  She is due for a flu shot and is willing to have it today.  She had a CT scan of the chest October 19, 2018 as a follow-up to August 29 CT scan of the chest.  The left lower lobe 0.8/0.9 cm pulmonary nodule is stable.  Her hematoma is also stable.   She does have coronary artery atherosclerosis.  She says she has seen Dr. Irish Lack and has been reassured.  She has allergic asthma and sees an outside allergist.  She is on nasal inhalers, Symbicort, Spiriva and Xolair.  She is also on ACE inhibitor lisinopril.  She states this does not cause cough or make the asthma worse    IMPRESSION: CT chest 10/19/2018 1. Irregular posterior left lower lobe 0.8 cm pulmonary nodule is stable since 08/20/2017 chest CT, more likely benign. Suggest continued follow-up chest CT in 12 months given the irregular morphology of this nodule. 2. Separate stable benign left lower lobe pulmonary hamartoma. 3. Three-vessel coronary atherosclerosis. 4. Small hiatal hernia.  Aortic Atherosclerosis (ICD10-I70.0).   Electronically Signed   By: Ilona Sorrel M.D.   On: 10/19/2018 14:09 ROS - per HPI    OV 12/26/2019  Subjective:  Patient ID: Melanie Armstrong, female , DOB: 1956/02/06 , age 62 y.o. , MRN: [id number] , ADDRESS: [place] [address]  12/26/2019 -   Chief Complaint  Patient presents with   Follow-up    go over ct results   Follow-up lung nodules -  9 mm left lower lobe in the setting of separate stable 1.7 cm benign left lower lobe hamartoma  - Hx of  history of asthma followed by allergist. - nasal inhalers, symbicort, spiriva, xolair - Also on ace inhibitors without cough   HPI Melanie Armstrong 64 y.o. -presents for follow-up of the above issues.  Her asthma is under well control.  She is on Spiriva Symbicort and Xolair.  She does not want to reduce this.  She uses albuterol for rescue once a week.  Her allergist manages this.  She had a CT scan of the chest in August 2021 for her left lower lobe hamartoma in the chest and also left lower lobe nodule.  These are stable.  There are some spiculation reported around her nodule in the left lower lobe therefore the radiologist recommended doing another CT scan in 1 year.  This is  despite the fact the nodule has been stable for 2 years.  I discussed with her the risks of getting another CT scan such as radiation but she is willing to have another CT scan done in 1 year.  There are no other active complaints.  She has had a flu shot and also the Covid vaccine.   IMPRESSION: 1. 7 x 7 mm LEFT lower lobe pulmonary nodule with irregular margins measures 7 x 7 mm on the study of 08/20/2017. Stability over 2 years time argues for benign etiology. Given irregular margins could consider continued surveillance at a 12 month interval. 2. Scattered small pulmonary nodules throughout the upper lobes also without interval change, potentially related to prior infection or inflammation. 3. Presumed pulmonary hamartoma is unchanged in the LEFT chest measuring approximately 1.9 x 1.9 cm. 4. Three-vessel coronary artery disease. 5. Small hiatal hernia. 6. Aortic atherosclerosis.  Aortic Atherosclerosis (ICD10-I70.0).   Electronically Signed   By: Zetta Bills M.D.   On: 11/28/2019 13:47   ROS - per HPI     has a past medical history of Arthritis, Asthma, GERD (gastroesophageal reflux disease), Heart murmur, and Hypertension.   reports that she has never smoked. She has never used smokeless tobacco.  Past Surgical History:  Procedure Laterality Date   CHOLECYSTECTOMY     TONSILLECTOMY     TOTAL HIP ARTHROPLASTY Right 08/22/2014   Procedure: RIGHT TOTAL HIP ARTHROPLASTY ANTERIOR APPROACH;  Surgeon: Paralee Cancel, MD;  Location: [facility];  Service: Orthopedics;  Laterality: Right;    Allergies  Allergen Reactions   Advair Diskus [Fluticasone-Salmeterol]     Per patient, she thinks she developed walking PNA.    Avelox [Moxifloxacin Hcl In Nacl] Other (See Comments)    Muscle Aches   Levofloxacin Other (See Comments)    Tendon pain   Protonix [Pantoprazole Sodium] Diarrhea    Immunization History  Administered Date(s) Administered   Influenza,inj,Quad  PF,6+ Mos 01/26/2018, 12/13/2018   Influenza-Unspecified 01/13/2012, 12/16/2019   Pneumococcal Conjugate-13 04/14/2016   Tdap 11/12/2009    Family History  Problem Relation Age of Onset   Heart disease Father    Allergic rhinitis Neg Hx    Angioedema Neg Hx    Asthma Neg Hx    Eczema Neg Hx    Immunodeficiency Neg Hx    Urticaria Neg Hx      Current Outpatient Medications:    albuterol (PROAIR HFA) 108 (90  Base) MCG/ACT inhaler, Inhale 2 puffs into the lungs every 4 (four) hours as needed., Disp: 18 g, Rfl: 1   albuterol (PROVENTIL) (2.5 MG/3ML) 0.083% nebulizer solution, USE 1 VIAL IN NEBULIZER EVERY 4 HOURS AS NEEDED FOR WHEEZING OR  SHORTNESS  OF  BREATH, Disp: 600 mL, Rfl: 0   Azelastine HCl 0.15 % SOLN, Place 2 sprays into both nostrils 2 (two) times daily., Disp: 30 mL, Rfl: 5   budesonide-formoterol (SYMBICORT) 160-4.5 MCG/ACT inhaler, Inhale 2 puffs into the lungs 2 (two) times daily., Disp: 1 Inhaler, Rfl: 5   calcium-vitamin D (OSCAL WITH D) 500-200 MG-UNIT per tablet, Take 1 tablet by mouth daily with breakfast., Disp: , Rfl:    diazepam (VALIUM) 5 MG tablet, Take 5 mg by mouth every 6 (six) hours as needed for anxiety., Disp: , Rfl:    dorzolamide (TRUSOPT) 2 % ophthalmic solution, 1 drop 2 (two) times daily., Disp: , Rfl:    EPINEPHrine (EPIPEN 2-PAK) 0.3 mg/0.3 mL IJ SOAJ injection, USE AS DIRECTED FOR SEVERE ALLERGIC REACTION, Disp: 2 Device, Rfl: 1   fexofenadine (ALLEGRA) 180 MG tablet, Take 180 mg by mouth daily., Disp: , Rfl:    fluticasone (FLONASE) 50 MCG/ACT nasal spray, PLACE 1-2 SPRAYS INTO BOTH NOSTRILS DAILY AS NEEDED FOR ALLERGIES OR RHINITIS., Disp: 16 mL, Rfl: 2   furosemide (LASIX) 20 MG tablet, Take 20 mg by mouth every morning., Disp: , Rfl:    glucosamine-chondroitin 500-400 MG tablet, Take 1 tablet by mouth 2 (two) times daily., Disp: , Rfl:    irbesartan (AVAPRO) 150 MG tablet, Take 150 mg by mouth daily. 300mg , Disp: , Rfl:     montelukast (SINGULAIR) 10 MG tablet, Take 1 tablet (10 mg total) by mouth at bedtime., Disp: 30 tablet, Rfl: 5   Multiple Vitamin (MULTIVITAMIN WITH MINERALS) TABS tablet, Take 1 tablet by mouth daily., Disp: , Rfl:    Omega-3 Fatty Acids (OMEGA 3 PO), Take 1 capsule by mouth daily., Disp: , Rfl:    omeprazole (PRILOSEC) 20 MG capsule, Take 1 capsule (20 mg total) by mouth daily., Disp: 30 capsule, Rfl: 5   PRESCRIPTION MEDICATION, once a week., Disp: , Rfl:    tiotropium (SPIRIVA HANDIHALER) 18 MCG inhalation capsule, Place 1 capsule (18 mcg total) into inhaler and inhale 1 day or 1 dose., Disp: 30 capsule, Rfl: 5   Tiotropium Bromide Monohydrate (SPIRIVA RESPIMAT) 1.25 MCG/ACT AERS, Inhale 2 puffs into the lungs daily., Disp: 4 g, Rfl: 5   vitamin C (ASCORBIC ACID) 500 MG tablet, Take 500 mg by mouth daily., Disp: , Rfl:    XOLAIR 150 MG injection, INJECT 300 MG SUBCUTANEOUSLY EVERY 4 WEEKS -REFRIGERATE DO NOT FREEZE, Disp: 6 each, Rfl: 3  Current Facility-Administered Medications:    omalizumab Arvid Right) injection 300 mg, 300 mg, Subcutaneous, Q28 days, Kozlow, Donnamarie Poag, MD, 300 mg at 12/14/19 1119      Objective:   Vitals:   12/26/19 1008  BP: 124/86  Pulse: 94  Temp: (!) 97.5 F (36.4 C)  TempSrc: Oral  SpO2: 94%  Weight: 234 lb (106.1 kg)  Height: 5\' 3"  (1.6 m)    Estimated body mass index is 41.45 kg/m as calculated from the following:   Height as of this encounter: 5\' 3"  (1.6 m).   Weight as of this encounter: 234 lb (106.1 kg).  @WEIGHTCHANGE @  [employer]   12/26/19 1008  Weight: 234 lb (106.1 kg)     Physical Exam  General Appearance:  Alert, cooperative, no distress, appears stated age - yes , Deconditioned looking - no , OBESE  - yes, Sitting on Wheelchair -  no  Head:    Normocephalic, without obvious abnormality, atraumatic  Eyes:    PERRL, conjunctiva/corneas clear,  Ears:    Normal TM's and external ear canals, both ears  Nose:   Nares  normal, septum midline, mucosa normal, no drainage    or sinus tenderness. OXYGEN ON  - nora . Patient is @ ra   Throat:   Lips, mucosa, and tongue normal; teeth and gums normal. Cyanosis on lips - no  Neck:   Supple, symmetrical, trachea midline, no adenopathy;    thyroid:  no enlargement/tenderness/nodules; no carotid   bruit or JVD  Back:     Symmetric, no curvature, ROM normal, no CVA tenderness  Lungs:     Distress - no , Wheeze no, Barrell Chest - no, Purse lip breathing - no, Crackles - no   Chest Wall:    No tenderness or deformity.    Heart:    Regular rate and rhythm, S1 and S2 normal, no rub   or gallop, Murmur - no  Breast Exam:    NOT DONE  Abdomen:     Soft, non-tender, bowel sounds active all four quadrants,    no masses, no organomegaly. Visceral obesity - yes  Genitalia:   NOT DONE  Rectal:   NOT DONE  Extremities:   Extremities - normal, Has Cane - no, Clubbing - no, Edema - no  Pulses:   2+ and symmetric all extremities  Skin:   Stigmata of Connective Tissue Disease - no  Lymph nodes:   Cervical, supraclavicular, and axillary nodes normal  Psychiatric:  Neurologic:   Pleasant - yes, Anxious - no, Flat affect - no  CAm-ICU - neg, Alert and Oriented x 3 - yes, Moves all 4s - yes, Speech - normal, Cognition - intact           Assessment:       ICD-10-CM   1. Nodule of lower lobe of left lung  R91.1   2. History of asthma  Z87.09   3. Pulmonary hamartoma ([place])  Q85.9        Plan:     Patient Instructions     ICD-10-CM   1. Nodule of lower lobe of left lung  R91.1   2. History of asthma  Z87.09   3. Pulmonary hamartoma ([place])  Q85.9      Nodule of posterior lower lobe of left lung 65mm-9mm in CT may 10,2019 - > no change Aug 2019 -> July 2020 -> Aug 2021 - low prob for malignancy but radiology concerned for spiculation  Plan  - do repeat CT chest without contrast in mid Sept 2022 - Oct 2022    Pulmonary hamartoma [facility]) -1.9cm  - this is seen in  left lung lower part and stable in CT scan May 2019 and Aug 2019 and July 2020 and  Aug 2021  plan   no active folllowup  History of asthma  Well controlled. Respect decision not to taper spiriva or symbicoprt and xolair  Plan - per allergist     Followup Sept 2022  after CT chest     SIGNATURE    Dr. Brand Males, M.D., F.C.C.P,  Pulmonary and Critical Care Medicine Staff Physician, [place] Director - Interstitial Lung Disease  Program  Pulmonary [place] at [facility]  [place], [place], [address]  Pager: [phone], If no answer or between  15:00h - 7:00h: call [phone] Telephone: [phone]  10:40 AM 12/26/2019

## 2020-01-02 ENCOUNTER — Ambulatory Visit (INDEPENDENT_AMBULATORY_CARE_PROVIDER_SITE_OTHER): Payer: BC Managed Care – PPO

## 2020-01-02 DIAGNOSIS — J309 Allergic rhinitis, unspecified: Secondary | ICD-10-CM | POA: Diagnosis not present

## 2020-01-09 ENCOUNTER — Ambulatory Visit (INDEPENDENT_AMBULATORY_CARE_PROVIDER_SITE_OTHER): Payer: BC Managed Care – PPO | Admitting: *Deleted

## 2020-01-09 DIAGNOSIS — J309 Allergic rhinitis, unspecified: Secondary | ICD-10-CM | POA: Diagnosis not present

## 2020-01-10 DIAGNOSIS — J4550 Severe persistent asthma, uncomplicated: Secondary | ICD-10-CM | POA: Diagnosis not present

## 2020-01-11 ENCOUNTER — Ambulatory Visit (INDEPENDENT_AMBULATORY_CARE_PROVIDER_SITE_OTHER): Payer: BC Managed Care – PPO

## 2020-01-11 DIAGNOSIS — J4550 Severe persistent asthma, uncomplicated: Secondary | ICD-10-CM | POA: Diagnosis not present

## 2020-01-16 ENCOUNTER — Other Ambulatory Visit: Payer: Self-pay

## 2020-01-16 ENCOUNTER — Ambulatory Visit (INDEPENDENT_AMBULATORY_CARE_PROVIDER_SITE_OTHER): Payer: BC Managed Care – PPO

## 2020-01-16 DIAGNOSIS — J309 Allergic rhinitis, unspecified: Secondary | ICD-10-CM | POA: Diagnosis not present

## 2020-01-23 ENCOUNTER — Ambulatory Visit (INDEPENDENT_AMBULATORY_CARE_PROVIDER_SITE_OTHER): Payer: BC Managed Care – PPO

## 2020-01-23 DIAGNOSIS — J309 Allergic rhinitis, unspecified: Secondary | ICD-10-CM

## 2020-01-30 ENCOUNTER — Ambulatory Visit (INDEPENDENT_AMBULATORY_CARE_PROVIDER_SITE_OTHER): Payer: BC Managed Care – PPO

## 2020-01-30 DIAGNOSIS — J309 Allergic rhinitis, unspecified: Secondary | ICD-10-CM | POA: Diagnosis not present

## 2020-02-02 DIAGNOSIS — J3089 Other allergic rhinitis: Secondary | ICD-10-CM

## 2020-02-02 NOTE — Progress Notes (Signed)
Vial exp 02-01-21

## 2020-02-06 ENCOUNTER — Ambulatory Visit (INDEPENDENT_AMBULATORY_CARE_PROVIDER_SITE_OTHER): Payer: BC Managed Care – PPO | Admitting: *Deleted

## 2020-02-06 DIAGNOSIS — J309 Allergic rhinitis, unspecified: Secondary | ICD-10-CM

## 2020-02-07 DIAGNOSIS — J4550 Severe persistent asthma, uncomplicated: Secondary | ICD-10-CM | POA: Diagnosis not present

## 2020-02-08 ENCOUNTER — Other Ambulatory Visit: Payer: Self-pay

## 2020-02-08 ENCOUNTER — Ambulatory Visit (INDEPENDENT_AMBULATORY_CARE_PROVIDER_SITE_OTHER): Payer: BC Managed Care – PPO | Admitting: *Deleted

## 2020-02-08 DIAGNOSIS — J4550 Severe persistent asthma, uncomplicated: Secondary | ICD-10-CM

## 2020-02-10 DIAGNOSIS — Z96641 Presence of right artificial hip joint: Secondary | ICD-10-CM | POA: Diagnosis not present

## 2020-02-10 DIAGNOSIS — Z96642 Presence of left artificial hip joint: Secondary | ICD-10-CM | POA: Diagnosis not present

## 2020-02-13 ENCOUNTER — Ambulatory Visit (INDEPENDENT_AMBULATORY_CARE_PROVIDER_SITE_OTHER): Payer: BC Managed Care – PPO

## 2020-02-13 DIAGNOSIS — J309 Allergic rhinitis, unspecified: Secondary | ICD-10-CM | POA: Diagnosis not present

## 2020-02-21 ENCOUNTER — Ambulatory Visit (INDEPENDENT_AMBULATORY_CARE_PROVIDER_SITE_OTHER): Payer: BC Managed Care – PPO | Admitting: *Deleted

## 2020-02-21 DIAGNOSIS — J309 Allergic rhinitis, unspecified: Secondary | ICD-10-CM | POA: Diagnosis not present

## 2020-02-21 DIAGNOSIS — H40053 Ocular hypertension, bilateral: Secondary | ICD-10-CM | POA: Diagnosis not present

## 2020-02-27 ENCOUNTER — Ambulatory Visit: Payer: BC Managed Care – PPO | Admitting: Allergy

## 2020-02-28 ENCOUNTER — Ambulatory Visit (INDEPENDENT_AMBULATORY_CARE_PROVIDER_SITE_OTHER): Payer: BC Managed Care – PPO

## 2020-02-28 DIAGNOSIS — J309 Allergic rhinitis, unspecified: Secondary | ICD-10-CM | POA: Diagnosis not present

## 2020-02-29 ENCOUNTER — Ambulatory Visit: Payer: BC Managed Care – PPO | Admitting: Allergy

## 2020-03-05 ENCOUNTER — Ambulatory Visit (INDEPENDENT_AMBULATORY_CARE_PROVIDER_SITE_OTHER): Payer: BC Managed Care – PPO

## 2020-03-05 DIAGNOSIS — J309 Allergic rhinitis, unspecified: Secondary | ICD-10-CM

## 2020-03-06 DIAGNOSIS — K219 Gastro-esophageal reflux disease without esophagitis: Secondary | ICD-10-CM | POA: Diagnosis not present

## 2020-03-06 DIAGNOSIS — J4550 Severe persistent asthma, uncomplicated: Secondary | ICD-10-CM | POA: Diagnosis not present

## 2020-03-06 DIAGNOSIS — J3089 Other allergic rhinitis: Secondary | ICD-10-CM | POA: Diagnosis not present

## 2020-03-07 ENCOUNTER — Ambulatory Visit: Payer: BC Managed Care – PPO | Admitting: Allergy

## 2020-03-07 ENCOUNTER — Other Ambulatory Visit: Payer: Self-pay

## 2020-03-07 ENCOUNTER — Ambulatory Visit: Payer: Self-pay

## 2020-03-07 ENCOUNTER — Encounter: Payer: Self-pay | Admitting: Allergy

## 2020-03-07 VITALS — BP 170/88 | HR 76 | Temp 99.5°F | Resp 18 | Ht 63.0 in | Wt 233.0 lb

## 2020-03-07 DIAGNOSIS — J4550 Severe persistent asthma, uncomplicated: Secondary | ICD-10-CM

## 2020-03-07 DIAGNOSIS — J3089 Other allergic rhinitis: Secondary | ICD-10-CM | POA: Diagnosis not present

## 2020-03-07 DIAGNOSIS — K219 Gastro-esophageal reflux disease without esophagitis: Secondary | ICD-10-CM | POA: Diagnosis not present

## 2020-03-07 MED ORDER — EPINEPHRINE 0.3 MG/0.3ML IJ SOAJ: 0.3000 mg | INTRAMUSCULAR | 1 refills | Status: DC | PRN

## 2020-03-07 NOTE — Assessment & Plan Note (Signed)
Stable with below regimen. Using albuterol at night sometimes due to coughing fits. Triggers are weather changes, getting over heated and strong scents.   ACT score 21.  Today's spirometry was normal. . Daily controller medication(s): continue with Symbicort 118mcg 2 puffs twice a day with spacer and rinse mouth afterwards. o Continue Spiriva daily. o Continue montelukast 10mg  daily. o Continue Xolair 300mg  every 4 weeks - given today.  . May use albuterol rescue inhaler 2 puffs every 4 to 6 hours as needed for shortness of breath, chest tightness, coughing, and wheezing. May use albuterol rescue inhaler 2 puffs 5 to 15 minutes prior to strenuous physical activities. Monitor frequency of use.

## 2020-03-07 NOTE — Assessment & Plan Note (Signed)
Stable.  Continue with omeprazole 20mg in the morning. 

## 2020-03-07 NOTE — Patient Instructions (Addendum)
Asthma: . Daily controller medication(s): continue with Symbicort 121mcg 2 puffs twice a day with spacer and rinse mouth afterwards. o Continue Spiriva daily. o Continue montelukast 10mg  daily. o Continue Xolair 300mg  every 4 weeks.  . May use albuterol rescue inhaler 2 puffs every 4 to 6 hours as needed for shortness of breath, chest tightness, coughing, and wheezing. May use albuterol rescue inhaler 2 puffs 5 to 15 minutes prior to strenuous physical activities. Monitor frequency of use.  . Asthma control goals:  o Full participation in all desired activities (may need albuterol before activity) o Albuterol use two times or less a week on average (not counting use with activity) o Cough interfering with sleep two times or less a month o Oral steroids no more than once a year o No hospitalizations  Allergic rhinitis  Continue environmental control measures (weed, molds)  Continue allergy injections - will try to go to every 2 weeks once you hit maintenance dose with the new vial.  Continue montelukast 10mg  daily and allegra 180mg  daily.   May use azelastine 1-2 sprays per nostril at night for drainage.  DECREASE Flonase to 1 spray per nostril in the morning - use every 1-2 days for nasal congestion.   Check with your eye doctor about using Flonase with your glaucoma.  Reflux  Continue with omeprazole 20mg  in the morning.  Follow up in 4 months or sooner if needed.

## 2020-03-07 NOTE — Progress Notes (Signed)
Follow Up Note  RE: Melanie Armstrong MRN: [id number] DOB: 1955-11-18 Date of Office Visit: 03/07/2020  Referring provider: [name], MD Primary care provider: [name], MD  Chief Complaint: Asthma  History of Present Illness: I had the pleasure of seeing Melanie Armstrong for a follow up visit at the [facility] on 03/07/2020. She is a 64 y.o. female, who is being followed for asthma on Xolair injections, allergic rhinitis on AIT, GERD. Her previous allergy office visit was on 10/24/2019 with Dr. [name]. Today is a regular follow up visit.  Severe persistent asthma Denies any SOB, wheezing, chest tightness, ER/urgent care visits or prednisone use since the last visit. Sometimes wakes up at night due to getting hot and gets coughing. Sometimes uses albuterol with good benefit and other times falls right back to sleep without any inhaler use. This happens about once a week.  Patient is back at work now.  Currently on Symbicort 19mcg 2 puffs BID, Spiriva daily, Singulair, and Xolair 300mg  every 4 weeks with good benefit.  Needs refill on epinephrine device.    Allergic rhinitis Currently on Allegra daily, azelastine 1-2 sprays per nostril QHS, Flonase 2 sprays per nostril daily. Patient has glaucoma.  Allergy injections are going well and currently receiving it once a week.  Interesting in spacing out to every 2 weeks now.  Gastroesophageal reflux disease Stable.  Assessment and Plan: Melanie Armstrong is a 64 y.o. female with: Severe persistent asthma, uncomplicated Stable with below regimen. Using albuterol at night sometimes due to coughing fits. Triggers are weather changes, getting over heated and strong scents.   ACT score 21.  Today's spirometry was normal. . Daily controller medication(s): continue with Symbicort 156mcg 2 puffs twice a day with spacer and rinse mouth afterwards. o Continue Spiriva daily. o Continue montelukast 10mg  daily. o Continue  Xolair 300mg  every 4 weeks - given today.  . May use albuterol rescue inhaler 2 puffs every 4 to 6 hours as needed for shortness of breath, chest tightness, coughing, and wheezing. May use albuterol rescue inhaler 2 puffs 5 to 15 minutes prior to strenuous physical activities. Monitor frequency of use.   Allergic rhinitis Stable with below regimen.    Continue environmental control measures (weed, molds)  Continue allergy injections - will try to go to every 2 weeks once you hit maintenance dose with the new vial.  Continue montelukast 10mg  daily and allegra 180mg  daily.   May use azelastine 1-2 sprays per nostril at night for drainage.  DECREASE Flonase to 1 spray per nostril in the morning - use every 1-2 days for nasal congestion.   Check with your eye doctor about using Flonase with your glaucoma.  Gastroesophageal reflux disease Stable.  Continue with omeprazole 20mg  in the morning.  Return in about 4 months (around 07/05/2020).  Meds ordered this encounter  Medications  . EPINEPHrine (EPIPEN 2-PAK) 0.3 mg/0.3 mL IJ SOAJ injection    Sig: Inject 0.3 mg into the muscle as needed for anaphylaxis. USE AS DIRECTED FOR SEVERE ALLERGIC REACTION    Dispense:  1 each    Refill:  1    Please dispense Mylan brand generic only. Thank you.   Diagnostics: Spirometry:  Tracings reviewed. Her effort: Good reproducible efforts. FVC: 2.75 L FEV1: 2.46 L, 106% predicted FEV1/FVC ratio: 89% Interpretation: Spirometry consistent with normal pattern.  Please see scanned spirometry results for details.  Medication List:  Current Outpatient Medications  Medication Sig Dispense Refill  .  albuterol (PROAIR HFA) 108 (90 Base) MCG/ACT inhaler Inhale 2 puffs into the lungs every 4 (four) hours as needed. 18 g 1  . albuterol (PROVENTIL) (2.5 MG/3ML) 0.083% nebulizer solution USE 1 VIAL IN NEBULIZER EVERY 4 HOURS AS NEEDED FOR WHEEZING OR  SHORTNESS  OF  BREATH 600 mL 0  . Azelastine HCl 0.15 %  SOLN Place 2 sprays into both nostrils 2 (two) times daily. 30 mL 5  . budesonide-formoterol (SYMBICORT) 160-4.5 MCG/ACT inhaler Inhale 2 puffs into the lungs 2 (two) times daily. 1 Inhaler 5  . calcium-vitamin D (OSCAL WITH D) 500-200 MG-UNIT per tablet Take 1 tablet by mouth daily with breakfast.    . diazepam (VALIUM) 5 MG tablet Take 5 mg by mouth every 6 (six) hours as needed for anxiety.    . dorzolamide (TRUSOPT) 2 % ophthalmic solution 1 drop 2 (two) times daily.    Marland Kitchen EPINEPHrine (EPIPEN 2-PAK) 0.3 mg/0.3 mL IJ SOAJ injection Inject 0.3 mg into the muscle as needed for anaphylaxis. USE AS DIRECTED FOR SEVERE ALLERGIC REACTION 1 each 1  . fexofenadine (ALLEGRA) 180 MG tablet Take 180 mg by mouth daily.    . fluticasone (FLONASE) 50 MCG/ACT nasal spray PLACE 1-2 SPRAYS INTO BOTH NOSTRILS DAILY AS NEEDED FOR ALLERGIES OR RHINITIS. 16 mL 2  . furosemide (LASIX) 20 MG tablet Take 20 mg by mouth every morning.    Marland Kitchen glucosamine-chondroitin 500-400 MG tablet Take 1 tablet by mouth 2 (two) times daily.    Marland Kitchen lisinopril (ZESTRIL) 20 MG tablet Take 40 mg by mouth daily.    . montelukast (SINGULAIR) 10 MG tablet Take 1 tablet (10 mg total) by mouth at bedtime. 30 tablet 5  . Multiple Vitamin (MULTIVITAMIN WITH MINERALS) TABS tablet Take 1 tablet by mouth daily.    . Omega-3 Fatty Acids (OMEGA 3 PO) Take 1 capsule by mouth daily.    Marland Kitchen omeprazole (PRILOSEC) 20 MG capsule Take 1 capsule (20 mg total) by mouth daily. 30 capsule 5  . PRESCRIPTION MEDICATION once a week.    . tiotropium (SPIRIVA HANDIHALER) 18 MCG inhalation capsule Place 1 capsule (18 mcg total) into inhaler and inhale 1 day or 1 dose. 30 capsule 5  . Tiotropium Bromide Monohydrate (SPIRIVA RESPIMAT) 1.25 MCG/ACT AERS Inhale 2 puffs into the lungs daily. 4 g 5  . vitamin C (ASCORBIC ACID) 500 MG tablet Take 500 mg by mouth daily.    Arvid Right 150 MG injection INJECT 300 MG SUBCUTANEOUSLY EVERY 4 WEEKS -REFRIGERATE DO NOT FREEZE 6 each 3    Current Facility-Administered Medications  Medication Dose Route Frequency Provider Last Rate Last Admin  . omalizumab Arvid Right) injection 300 mg  300 mg Subcutaneous Q28 days Jiles Prows, MD   300 mg at 03/07/20 1053   Allergies: Allergies  Allergen Reactions  . Advair Diskus [Fluticasone-Salmeterol]     Per patient, she thinks she developed walking PNA.   Marland Kitchen Avelox [Moxifloxacin Hcl In Nacl] Other (See Comments)    Muscle Aches  . Levofloxacin Other (See Comments)    Tendon pain  . Protonix [Pantoprazole Sodium] Diarrhea   I reviewed her past medical history, social history, family history, and environmental history and no significant changes have been reported from her previous visit.  Review of Systems  Constitutional: Negative for appetite change, chills, fever and unexpected weight change.  HENT: Negative for congestion and rhinorrhea.   Eyes: Negative for itching.  Respiratory: Negative for cough, chest tightness, shortness of breath and  wheezing.   Gastrointestinal: Negative for abdominal pain.  Skin: Negative for rash.  Allergic/Immunologic: Positive for environmental allergies.  Neurological: Negative for headaches.   Objective: BP (!) 170/88   Pulse 76   Temp 99.5 F (37.5 C)   Resp 18   Ht 5\' 3"  (1.6 m)   Wt 233 lb (105.7 kg)   SpO2 96%   BMI 41.27 kg/m  Body mass index is 41.27 kg/m. Physical Exam Vitals and nursing note reviewed.  Constitutional:      Appearance: Normal appearance. She is well-developed.  HENT:     Head: Normocephalic and atraumatic.     Right Ear: Tympanic membrane and external ear normal.     Left Ear: Tympanic membrane and external ear normal.     Nose: Nose normal. No congestion or rhinorrhea.     Mouth/Throat:     Mouth: Mucous membranes are moist.     Pharynx: Oropharynx is clear.  Eyes:     Conjunctiva/sclera: Conjunctivae normal.  Cardiovascular:     Rate and Rhythm: Normal rate and regular rhythm.     Heart sounds:  Normal heart sounds. No murmur heard.   Pulmonary:     Effort: Pulmonary effort is normal.     Breath sounds: Normal breath sounds. No wheezing, rhonchi or rales.  Musculoskeletal:     Cervical back: Neck supple.  Skin:    General: Skin is warm.     Findings: No rash.  Neurological:     Mental Status: She is alert and oriented to person, place, and time.  Psychiatric:        Behavior: Behavior normal.    Previous notes and tests were reviewed. The plan was reviewed with the patient/family, and all questions/concerned were addressed.  It was my pleasure to see Melanie Armstrong today and participate in her care. Please feel free to contact me with any questions or concerns.  Sincerely,  Rexene Alberts, DO Allergy & Immunology  [facility] office: [place] office: [phone]

## 2020-03-07 NOTE — Assessment & Plan Note (Signed)
Stable with below regimen.    Continue environmental control measures (weed, molds)  Continue allergy injections - will try to go to every 2 weeks once you hit maintenance dose with the new vial.  Continue montelukast 10mg  daily and allegra 180mg  daily.   May use azelastine 1-2 sprays per nostril at night for drainage.  DECREASE Flonase to 1 spray per nostril in the morning - use every 1-2 days for nasal congestion.   Check with your eye doctor about using Flonase with your glaucoma.

## 2020-03-13 ENCOUNTER — Ambulatory Visit (INDEPENDENT_AMBULATORY_CARE_PROVIDER_SITE_OTHER): Payer: BC Managed Care – PPO

## 2020-03-13 DIAGNOSIS — J309 Allergic rhinitis, unspecified: Secondary | ICD-10-CM

## 2020-03-19 DIAGNOSIS — J0100 Acute maxillary sinusitis, unspecified: Secondary | ICD-10-CM | POA: Diagnosis not present

## 2020-03-20 ENCOUNTER — Ambulatory Visit (INDEPENDENT_AMBULATORY_CARE_PROVIDER_SITE_OTHER): Payer: BC Managed Care – PPO | Admitting: *Deleted

## 2020-03-20 DIAGNOSIS — J309 Allergic rhinitis, unspecified: Secondary | ICD-10-CM | POA: Diagnosis not present

## 2020-03-27 ENCOUNTER — Ambulatory Visit (INDEPENDENT_AMBULATORY_CARE_PROVIDER_SITE_OTHER): Payer: BC Managed Care – PPO

## 2020-03-27 DIAGNOSIS — J309 Allergic rhinitis, unspecified: Secondary | ICD-10-CM

## 2020-04-03 DIAGNOSIS — J4550 Severe persistent asthma, uncomplicated: Secondary | ICD-10-CM | POA: Diagnosis not present

## 2020-04-04 ENCOUNTER — Ambulatory Visit (INDEPENDENT_AMBULATORY_CARE_PROVIDER_SITE_OTHER): Payer: BC Managed Care – PPO

## 2020-04-04 DIAGNOSIS — J4550 Severe persistent asthma, uncomplicated: Secondary | ICD-10-CM

## 2020-04-10 ENCOUNTER — Ambulatory Visit (INDEPENDENT_AMBULATORY_CARE_PROVIDER_SITE_OTHER): Payer: BC Managed Care – PPO | Admitting: *Deleted

## 2020-04-10 DIAGNOSIS — J309 Allergic rhinitis, unspecified: Secondary | ICD-10-CM | POA: Diagnosis not present

## 2020-04-25 ENCOUNTER — Ambulatory Visit (INDEPENDENT_AMBULATORY_CARE_PROVIDER_SITE_OTHER): Payer: Medicare Other | Admitting: *Deleted

## 2020-04-25 DIAGNOSIS — J309 Allergic rhinitis, unspecified: Secondary | ICD-10-CM | POA: Diagnosis not present

## 2020-05-01 DIAGNOSIS — J4550 Severe persistent asthma, uncomplicated: Secondary | ICD-10-CM | POA: Diagnosis not present

## 2020-05-01 NOTE — Progress Notes (Signed)
VIAL EXP 05-01-21

## 2020-05-02 ENCOUNTER — Ambulatory Visit (INDEPENDENT_AMBULATORY_CARE_PROVIDER_SITE_OTHER): Payer: Medicare Other

## 2020-05-02 DIAGNOSIS — J4550 Severe persistent asthma, uncomplicated: Secondary | ICD-10-CM | POA: Diagnosis not present

## 2020-05-02 DIAGNOSIS — J3089 Other allergic rhinitis: Secondary | ICD-10-CM | POA: Diagnosis not present

## 2020-05-09 ENCOUNTER — Ambulatory Visit (INDEPENDENT_AMBULATORY_CARE_PROVIDER_SITE_OTHER): Payer: Medicare Other

## 2020-05-09 DIAGNOSIS — J309 Allergic rhinitis, unspecified: Secondary | ICD-10-CM | POA: Diagnosis not present

## 2020-05-22 ENCOUNTER — Ambulatory Visit (INDEPENDENT_AMBULATORY_CARE_PROVIDER_SITE_OTHER): Payer: Medicare Other | Admitting: *Deleted

## 2020-05-22 DIAGNOSIS — J309 Allergic rhinitis, unspecified: Secondary | ICD-10-CM

## 2020-05-29 DIAGNOSIS — J4550 Severe persistent asthma, uncomplicated: Secondary | ICD-10-CM | POA: Diagnosis not present

## 2020-05-30 ENCOUNTER — Ambulatory Visit (INDEPENDENT_AMBULATORY_CARE_PROVIDER_SITE_OTHER): Payer: Medicare Other | Admitting: *Deleted

## 2020-05-30 ENCOUNTER — Other Ambulatory Visit: Payer: Self-pay

## 2020-05-30 DIAGNOSIS — J4550 Severe persistent asthma, uncomplicated: Secondary | ICD-10-CM | POA: Diagnosis not present

## 2020-06-05 ENCOUNTER — Ambulatory Visit (INDEPENDENT_AMBULATORY_CARE_PROVIDER_SITE_OTHER): Payer: Medicare Other | Admitting: *Deleted

## 2020-06-05 DIAGNOSIS — J309 Allergic rhinitis, unspecified: Secondary | ICD-10-CM

## 2020-06-19 ENCOUNTER — Ambulatory Visit (INDEPENDENT_AMBULATORY_CARE_PROVIDER_SITE_OTHER): Payer: Medicare Other

## 2020-06-19 DIAGNOSIS — J309 Allergic rhinitis, unspecified: Secondary | ICD-10-CM

## 2020-06-20 ENCOUNTER — Other Ambulatory Visit: Payer: Self-pay

## 2020-06-20 ENCOUNTER — Ambulatory Visit (INDEPENDENT_AMBULATORY_CARE_PROVIDER_SITE_OTHER): Payer: Medicare Other

## 2020-06-20 ENCOUNTER — Encounter: Payer: Self-pay | Admitting: Podiatry

## 2020-06-20 ENCOUNTER — Ambulatory Visit (INDEPENDENT_AMBULATORY_CARE_PROVIDER_SITE_OTHER): Payer: Medicare Other | Admitting: Podiatry

## 2020-06-20 DIAGNOSIS — M7751 Other enthesopathy of right foot: Secondary | ICD-10-CM

## 2020-06-20 DIAGNOSIS — D2371 Other benign neoplasm of skin of right lower limb, including hip: Secondary | ICD-10-CM

## 2020-06-20 MED ORDER — DEXAMETHASONE SODIUM PHOSPHATE 120 MG/30ML IJ SOLN
2.0000 mg | Freq: Once | INTRAMUSCULAR | Status: AC
  Administered 2020-06-20: 2 mg via INTRA_ARTICULAR

## 2020-06-20 NOTE — Progress Notes (Signed)
She presents today having seen her for about 8 months or so chief complaint of a painful fifth metatarsal of the right foot.  States it is red and swollen callused area on it.  She is tried lotions and filing.  She denies any trauma.  Objective: Vital signs are stable she alert oriented x3 there is erythema and edema fluctuance overlying the fifth metatarsophalangeal joint and a overlying reactive hyperkeratotic lesion.  This is exquisitely tender painful on palpation.  Radiographs taken today demonstrate a lateral deviation of the fourth and fifth metatarsal heads at the metatarsal neck.  This is resulting in more of a pressure point on the fifth met head.  Assessment: Capsulitis bursitis tailor's bunion deformity fifth met right.  Plan: Discussed etiology pathology conservative versus surgical therapies at this point time I injected the bursa overlying the fifth met head right foot.  I removed the reactive hyper keratoma and she felt better immediately.  I injected the bursa with 2 mg of dexamethasone.

## 2020-06-24 ENCOUNTER — Encounter: Payer: Self-pay | Admitting: Allergy

## 2020-06-25 ENCOUNTER — Ambulatory Visit (INDEPENDENT_AMBULATORY_CARE_PROVIDER_SITE_OTHER): Payer: Medicare Other

## 2020-06-25 DIAGNOSIS — J309 Allergic rhinitis, unspecified: Secondary | ICD-10-CM | POA: Diagnosis not present

## 2020-06-26 DIAGNOSIS — J4550 Severe persistent asthma, uncomplicated: Secondary | ICD-10-CM

## 2020-06-27 ENCOUNTER — Ambulatory Visit (INDEPENDENT_AMBULATORY_CARE_PROVIDER_SITE_OTHER): Payer: Medicare Other

## 2020-06-27 DIAGNOSIS — J4550 Severe persistent asthma, uncomplicated: Secondary | ICD-10-CM | POA: Diagnosis not present

## 2020-07-03 ENCOUNTER — Ambulatory Visit (INDEPENDENT_AMBULATORY_CARE_PROVIDER_SITE_OTHER): Payer: Medicare Other | Admitting: *Deleted

## 2020-07-03 DIAGNOSIS — J309 Allergic rhinitis, unspecified: Secondary | ICD-10-CM

## 2020-07-10 ENCOUNTER — Ambulatory Visit (INDEPENDENT_AMBULATORY_CARE_PROVIDER_SITE_OTHER): Payer: Medicare Other | Admitting: *Deleted

## 2020-07-10 DIAGNOSIS — J309 Allergic rhinitis, unspecified: Secondary | ICD-10-CM | POA: Diagnosis not present

## 2020-07-10 NOTE — Progress Notes (Signed)
Follow Up Note  RE: VARONICA SIHARATH MRN: [id number] DOB: [birth year] Date of Office Visit: 07/11/2020  Referring provider: [name], MD Primary care provider: [name], MD  Chief Complaint: Immunotherapy (Allergy shots Every other week didn't work out, wanted to go back to every week, allergies are getting better since she's on every week again.) and Asthma (Doing okay)  History of Present Illness: I had the pleasure of seeing Jerriyah Louis for a follow up visit at the [facility] [place] on 07/11/2020. She is a 65 y.o. female, who is being followed for asthma on Xolair 300mg  every 4 weeks and allergic rhinitis on AIT. Her previous allergy office visit was on 03/07/2020 with Dr. [name]. Today is a regular follow up visit.  Severe persistent asthma ACT score 19. Currently on Symbicort 1108mcg 2 puffs BID, Spiriva daily, Xolair 300mg  every 4 weeks. Using albuterol at night for shortness of breath about 2 times per week with good benefit.   Denies any ER/urgent care visits or prednisone use since the last visit.  Allergic rhinitis Currently on weekly injections, allegra daily, Flonase 2 sprays per nostril in the AM, montelukast daily, azelastine 1 spray per nostril QHS.  No nosebleeds.  Allergy injections are helping but needs it weekly.   Gastroesophageal reflux disease Currently on omeprazole 20mg  in the morning with good benefit.   Assessment and Plan: Tinsley is a 65 y.o. female with: Severe persistent asthma, uncomplicated Stable with below regimen. Using albuterol at night 2 times per week due to shortness of breath.  ACT score 19  Today's spirometry was unremarkable given effort. . Daily controller medication(s): continue with Symbicort 160mcg 2 puffs twice a day with spacer and rinse mouth afterwards. o Continue Spiriva 2 puffs daily. o Continue montelukast 10mg  daily. o Continue Xolair 300mg  every 4 weeks.  . May use albuterol rescue inhaler 2  puffs every 4 to 6 hours as needed for shortness of breath, chest tightness, coughing, and wheezing. May use albuterol rescue inhaler 2 puffs 5 to 15 minutes prior to strenuous physical activities. Monitor frequency of use.   Allergic rhinitis Doing better with weekly injections rather than biweekly.  Continue environmental control measures (weed, molds)  Continue allergy injections weekly.   Continue montelukast 10mg  daily and allegra 180mg  daily.   May use azelastine 1-2 sprays per nostril at night for drainage.  May use Flonase 1-2 sprays per nostril in the morning for nasal congestion.  Check with your eye doctor about using Flonase with your glaucoma.  Gastroesophageal reflux disease Stable.  Continue with omeprazole 20mg  in the morning.  Return in about 4 months (around 11/10/2020).  No orders of the defined types were placed in this encounter.  Lab Orders  No laboratory test(s) ordered today    Diagnostics: Spirometry:  Tracings reviewed. Her effort: It was hard to get consistent efforts and there is a question as to whether this reflects a maximal maneuver. FVC: 2.89L FEV1: 2.34L, 101% predicted FEV1/FVC ratio: 81% Interpretation: No overt abnormalities noted given today's efforts.  Please see scanned spirometry results for details.  Medication List:  Current Outpatient Medications  Medication Sig Dispense Refill  . albuterol (PROAIR HFA) 108 (90 Base) MCG/ACT inhaler Inhale 2 puffs into the lungs every 4 (four) hours as needed. 18 g 1  . albuterol (PROVENTIL) (2.5 MG/3ML) 0.083% nebulizer solution USE 1 VIAL IN NEBULIZER EVERY 4 HOURS AS NEEDED FOR WHEEZING OR  SHORTNESS  OF  BREATH 600 mL  0  . azelastine (ASTELIN) 0.1 % nasal spray Place into both nostrils.    . budesonide-formoterol (SYMBICORT) 160-4.5 MCG/ACT inhaler Inhale 2 puffs into the lungs 2 (two) times daily. 1 Inhaler 5  . Calcium-Phosphorus-Vitamin D (CITRACAL +D3) 250-107-500 MG-MG-UNIT CHEW     .  calcium-vitamin D (OSCAL WITH D) 500-200 MG-UNIT per tablet Take 1 tablet by mouth daily with breakfast.    . diazepam (VALIUM) 5 MG tablet Take 5 mg by mouth every 6 (six) hours as needed for anxiety.    . dorzolamide (TRUSOPT) 2 % ophthalmic solution 1 drop 2 (two) times daily.    Marland Kitchen EPINEPHrine (EPIPEN 2-PAK) 0.3 mg/0.3 mL IJ SOAJ injection Inject 0.3 mg into the muscle as needed for anaphylaxis. USE AS DIRECTED FOR SEVERE ALLERGIC REACTION 1 each 1  . Ferrous Sulfate (IRON) 325 (65 Fe) MG TABS     . fexofenadine (ALLEGRA) 180 MG tablet Take 180 mg by mouth daily.    . fluticasone (FLONASE) 50 MCG/ACT nasal spray PLACE 1-2 SPRAYS INTO BOTH NOSTRILS DAILY AS NEEDED FOR ALLERGIES OR RHINITIS. 16 mL 2  . furosemide (LASIX) 20 MG tablet Take 20 mg by mouth every morning.    Marland Kitchen glucosamine-chondroitin 500-400 MG tablet Take 1 tablet by mouth 2 (two) times daily.    Marland Kitchen lisinopril (ZESTRIL) 20 MG tablet Take 40 mg by mouth daily.    Marland Kitchen LUMIGAN 0.01 % SOLN SMARTSIG:In Eye(s)    . montelukast (SINGULAIR) 10 MG tablet Take 1 tablet (10 mg total) by mouth at bedtime. 30 tablet 5  . Multiple Vitamin (MULTIVITAMIN WITH MINERALS) TABS tablet Take 1 tablet by mouth daily.    . Omega-3 Fatty Acids (OMEGA 3 PO) Take 1 capsule by mouth daily.    Marland Kitchen omeprazole (PRILOSEC) 20 MG capsule Take 1 capsule (20 mg total) by mouth daily. 30 capsule 5  . PRESCRIPTION MEDICATION once a week.    . tiotropium (SPIRIVA HANDIHALER) 18 MCG inhalation capsule Place 1 capsule (18 mcg total) into inhaler and inhale 1 day or 1 dose. 30 capsule 5  . vitamin C (ASCORBIC ACID) 500 MG tablet Take 500 mg by mouth daily.     Current Facility-Administered Medications  Medication Dose Route Frequency Provider Last Rate Last Admin  . omalizumab Arvid Right) injection 300 mg  300 mg Subcutaneous Q28 days Jiles Prows, MD   300 mg at 06/27/20 1112   Allergies: Allergies  Allergen Reactions  . Advair Diskus [Fluticasone-Salmeterol]     Per  patient, she thinks she developed walking PNA.   Marland Kitchen Avelox [Moxifloxacin Hcl In Nacl] Other (See Comments)    Muscle Aches  . Irbesartan Other (See Comments)  . Levofloxacin Other (See Comments)    Tendon pain  . Olmesartan Other (See Comments)  . Other Other (See Comments)  . Protonix [Pantoprazole Sodium] Diarrhea   I reviewed her past medical history, social history, family history, and environmental history and no significant changes have been reported from her previous visit.  Review of Systems  Constitutional: Negative for appetite change, chills, fever and unexpected weight change.  HENT: Negative for congestion and rhinorrhea.   Eyes: Negative for itching.  Respiratory: Positive for shortness of breath. Negative for cough, chest tightness and wheezing.   Gastrointestinal: Negative for abdominal pain.  Skin: Negative for rash.  Allergic/Immunologic: Positive for environmental allergies.  Neurological: Negative for headaches.   Objective: BP (!) 160/82 (BP Location: Left Arm, Patient Position: Sitting, Cuff Size: Large)   Pulse 75  Temp 97.8 F (36.6 C) (Temporal)   Resp 16   SpO2 98%  There is no height or weight on file to calculate BMI. Physical Exam Vitals and nursing note reviewed.  Constitutional:      Appearance: Normal appearance. She is well-developed.  HENT:     Head: Normocephalic and atraumatic.     Right Ear: Tympanic membrane and external ear normal.     Left Ear: Tympanic membrane and external ear normal.     Nose: Nose normal. No congestion or rhinorrhea.     Mouth/Throat:     Mouth: Mucous membranes are moist.     Pharynx: Oropharynx is clear.  Eyes:     Conjunctiva/sclera: Conjunctivae normal.  Cardiovascular:     Rate and Rhythm: Normal rate and regular rhythm.     Heart sounds: Normal heart sounds. No murmur heard.   Pulmonary:     Effort: Pulmonary effort is normal.     Breath sounds: Normal breath sounds. No wheezing, rhonchi or rales.   Musculoskeletal:     Cervical back: Neck supple.  Skin:    General: Skin is warm.     Findings: No rash.  Neurological:     Mental Status: She is alert and oriented to person, place, and time.  Psychiatric:        Behavior: Behavior normal.    Previous notes and tests were reviewed. The plan was reviewed with the patient/family, and all questions/concerned were addressed.  It was my pleasure to see Melanie Armstrong today and participate in her care. Please feel free to contact me with any questions or concerns.  Sincerely,  Rexene Alberts, DO Allergy & Immunology  [facility] office: [place] office: [phone]

## 2020-07-11 ENCOUNTER — Encounter: Payer: Self-pay | Admitting: Allergy

## 2020-07-11 ENCOUNTER — Other Ambulatory Visit: Payer: Self-pay

## 2020-07-11 ENCOUNTER — Ambulatory Visit (INDEPENDENT_AMBULATORY_CARE_PROVIDER_SITE_OTHER): Payer: Medicare Other | Admitting: Allergy

## 2020-07-11 VITALS — BP 160/82 | HR 75 | Temp 97.8°F | Resp 16

## 2020-07-11 DIAGNOSIS — K219 Gastro-esophageal reflux disease without esophagitis: Secondary | ICD-10-CM | POA: Diagnosis not present

## 2020-07-11 DIAGNOSIS — J4550 Severe persistent asthma, uncomplicated: Secondary | ICD-10-CM | POA: Diagnosis not present

## 2020-07-11 DIAGNOSIS — J3089 Other allergic rhinitis: Secondary | ICD-10-CM | POA: Diagnosis not present

## 2020-07-11 NOTE — Assessment & Plan Note (Signed)
Stable.  Continue with omeprazole 20mg  in the morning.

## 2020-07-11 NOTE — Assessment & Plan Note (Signed)
Doing better with weekly injections rather than biweekly.  Continue environmental control measures (weed, molds)  Continue allergy injections weekly.   Continue montelukast 10mg  daily and allegra 180mg  daily.   May use azelastine 1-2 sprays per nostril at night for drainage.  May use Flonase 1-2 sprays per nostril in the morning for nasal congestion.  Check with your eye doctor about using Flonase with your glaucoma.

## 2020-07-11 NOTE — Patient Instructions (Addendum)
Asthma: . Daily controller medication(s): continue with Symbicort 133mcg 2 puffs twice a day with spacer and rinse mouth afterwards. o Continue Spiriva daily. o Continue montelukast 10mg  daily. o Continue Xolair 300mg  every 4 weeks.  . May use albuterol rescue inhaler 2 puffs every 4 to 6 hours as needed for shortness of breath, chest tightness, coughing, and wheezing. May use albuterol rescue inhaler 2 puffs 5 to 15 minutes prior to strenuous physical activities. Monitor frequency of use.  . Asthma control goals:  o Full participation in all desired activities (may need albuterol before activity) o Albuterol use two times or less a week on average (not counting use with activity) o Cough interfering with sleep two times or less a month o Oral steroids no more than once a year o No hospitalizations  Allergic rhinitis  Continue environmental control measures (weed, molds)  Continue allergy injections weekly.   Continue montelukast 10mg  daily and allegra 180mg  daily.   May use azelastine 1-2 sprays per nostril at night for drainage.  May use Flonase 1-2 sprays per nostril in the morning for nasal congestion.  Check with your eye doctor about using Flonase with your glaucoma.  Reflux  Continue with omeprazole 20mg  in the morning.  Follow up in 4 months or sooner if needed.

## 2020-07-11 NOTE — Assessment & Plan Note (Signed)
Stable with below regimen. Using albuterol at night 2 times per week due to shortness of breath.  ACT score 19  Today's spirometry was unremarkable given effort. . Daily controller medication(s): continue with Symbicort 152mcg 2 puffs twice a day with spacer and rinse mouth afterwards. o Continue Spiriva 2 puffs daily. o Continue montelukast 10mg  daily. o Continue Xolair 300mg  every 4 weeks.  . May use albuterol rescue inhaler 2 puffs every 4 to 6 hours as needed for shortness of breath, chest tightness, coughing, and wheezing. May use albuterol rescue inhaler 2 puffs 5 to 15 minutes prior to strenuous physical activities. Monitor frequency of use.

## 2020-07-17 ENCOUNTER — Ambulatory Visit (INDEPENDENT_AMBULATORY_CARE_PROVIDER_SITE_OTHER): Payer: Medicare Other | Admitting: *Deleted

## 2020-07-17 DIAGNOSIS — J309 Allergic rhinitis, unspecified: Secondary | ICD-10-CM

## 2020-07-23 ENCOUNTER — Ambulatory Visit: Payer: Medicare Other | Admitting: Podiatry

## 2020-07-24 ENCOUNTER — Ambulatory Visit (INDEPENDENT_AMBULATORY_CARE_PROVIDER_SITE_OTHER): Payer: Medicare Other | Admitting: *Deleted

## 2020-07-24 DIAGNOSIS — J309 Allergic rhinitis, unspecified: Secondary | ICD-10-CM

## 2020-07-25 ENCOUNTER — Ambulatory Visit: Payer: Self-pay

## 2020-07-25 DIAGNOSIS — J4550 Severe persistent asthma, uncomplicated: Secondary | ICD-10-CM | POA: Diagnosis not present

## 2020-07-26 ENCOUNTER — Other Ambulatory Visit: Payer: Self-pay

## 2020-07-26 ENCOUNTER — Ambulatory Visit (INDEPENDENT_AMBULATORY_CARE_PROVIDER_SITE_OTHER): Payer: Medicare Other | Admitting: *Deleted

## 2020-07-26 DIAGNOSIS — J4550 Severe persistent asthma, uncomplicated: Secondary | ICD-10-CM

## 2020-07-31 ENCOUNTER — Ambulatory Visit (INDEPENDENT_AMBULATORY_CARE_PROVIDER_SITE_OTHER): Payer: Medicare Other | Admitting: *Deleted

## 2020-07-31 DIAGNOSIS — J309 Allergic rhinitis, unspecified: Secondary | ICD-10-CM | POA: Diagnosis not present

## 2020-08-07 ENCOUNTER — Ambulatory Visit (INDEPENDENT_AMBULATORY_CARE_PROVIDER_SITE_OTHER): Payer: Medicare Other | Admitting: *Deleted

## 2020-08-07 DIAGNOSIS — J309 Allergic rhinitis, unspecified: Secondary | ICD-10-CM

## 2020-08-14 ENCOUNTER — Ambulatory Visit (INDEPENDENT_AMBULATORY_CARE_PROVIDER_SITE_OTHER): Payer: Medicare Other | Admitting: *Deleted

## 2020-08-14 DIAGNOSIS — J309 Allergic rhinitis, unspecified: Secondary | ICD-10-CM

## 2020-08-20 ENCOUNTER — Ambulatory Visit (INDEPENDENT_AMBULATORY_CARE_PROVIDER_SITE_OTHER): Payer: Medicare Other | Admitting: *Deleted

## 2020-08-20 DIAGNOSIS — J309 Allergic rhinitis, unspecified: Secondary | ICD-10-CM | POA: Diagnosis not present

## 2020-08-21 DIAGNOSIS — J4550 Severe persistent asthma, uncomplicated: Secondary | ICD-10-CM | POA: Diagnosis not present

## 2020-08-22 ENCOUNTER — Other Ambulatory Visit: Payer: Self-pay

## 2020-08-22 ENCOUNTER — Ambulatory Visit (INDEPENDENT_AMBULATORY_CARE_PROVIDER_SITE_OTHER): Payer: Medicare Other

## 2020-08-22 DIAGNOSIS — J4550 Severe persistent asthma, uncomplicated: Secondary | ICD-10-CM

## 2020-08-28 ENCOUNTER — Ambulatory Visit (INDEPENDENT_AMBULATORY_CARE_PROVIDER_SITE_OTHER): Payer: Medicare Other | Admitting: *Deleted

## 2020-08-28 DIAGNOSIS — J309 Allergic rhinitis, unspecified: Secondary | ICD-10-CM | POA: Diagnosis not present

## 2020-08-28 NOTE — Progress Notes (Signed)
VIAL EXP 08-28-21

## 2020-08-29 DIAGNOSIS — J3089 Other allergic rhinitis: Secondary | ICD-10-CM | POA: Diagnosis not present

## 2020-09-03 ENCOUNTER — Ambulatory Visit (INDEPENDENT_AMBULATORY_CARE_PROVIDER_SITE_OTHER): Payer: Medicare Other

## 2020-09-03 DIAGNOSIS — J309 Allergic rhinitis, unspecified: Secondary | ICD-10-CM

## 2020-09-11 ENCOUNTER — Ambulatory Visit (INDEPENDENT_AMBULATORY_CARE_PROVIDER_SITE_OTHER): Payer: Medicare Other | Admitting: *Deleted

## 2020-09-11 DIAGNOSIS — J309 Allergic rhinitis, unspecified: Secondary | ICD-10-CM

## 2020-09-17 ENCOUNTER — Ambulatory Visit (INDEPENDENT_AMBULATORY_CARE_PROVIDER_SITE_OTHER): Payer: Medicare Other

## 2020-09-17 DIAGNOSIS — J309 Allergic rhinitis, unspecified: Secondary | ICD-10-CM

## 2020-09-18 DIAGNOSIS — J4550 Severe persistent asthma, uncomplicated: Secondary | ICD-10-CM | POA: Diagnosis not present

## 2020-09-19 ENCOUNTER — Other Ambulatory Visit: Payer: Self-pay

## 2020-09-19 ENCOUNTER — Ambulatory Visit (INDEPENDENT_AMBULATORY_CARE_PROVIDER_SITE_OTHER): Payer: Medicare Other | Admitting: Allergy

## 2020-09-19 DIAGNOSIS — J4550 Severe persistent asthma, uncomplicated: Secondary | ICD-10-CM

## 2020-09-25 ENCOUNTER — Ambulatory Visit (INDEPENDENT_AMBULATORY_CARE_PROVIDER_SITE_OTHER): Payer: Medicare Other | Admitting: *Deleted

## 2020-09-25 DIAGNOSIS — J309 Allergic rhinitis, unspecified: Secondary | ICD-10-CM

## 2020-10-02 ENCOUNTER — Ambulatory Visit (INDEPENDENT_AMBULATORY_CARE_PROVIDER_SITE_OTHER): Payer: Medicare Other | Admitting: *Deleted

## 2020-10-02 DIAGNOSIS — J309 Allergic rhinitis, unspecified: Secondary | ICD-10-CM | POA: Diagnosis not present

## 2020-10-09 ENCOUNTER — Ambulatory Visit (INDEPENDENT_AMBULATORY_CARE_PROVIDER_SITE_OTHER): Payer: Medicare Other | Admitting: *Deleted

## 2020-10-09 DIAGNOSIS — J309 Allergic rhinitis, unspecified: Secondary | ICD-10-CM | POA: Diagnosis not present

## 2020-10-16 DIAGNOSIS — J4550 Severe persistent asthma, uncomplicated: Secondary | ICD-10-CM

## 2020-10-17 ENCOUNTER — Other Ambulatory Visit: Payer: Self-pay

## 2020-10-17 ENCOUNTER — Ambulatory Visit (INDEPENDENT_AMBULATORY_CARE_PROVIDER_SITE_OTHER): Payer: Medicare Other

## 2020-10-17 DIAGNOSIS — J4550 Severe persistent asthma, uncomplicated: Secondary | ICD-10-CM | POA: Diagnosis not present

## 2020-10-19 ENCOUNTER — Ambulatory Visit (INDEPENDENT_AMBULATORY_CARE_PROVIDER_SITE_OTHER): Payer: Medicare Other

## 2020-10-19 DIAGNOSIS — J309 Allergic rhinitis, unspecified: Secondary | ICD-10-CM

## 2020-10-29 ENCOUNTER — Ambulatory Visit (INDEPENDENT_AMBULATORY_CARE_PROVIDER_SITE_OTHER): Payer: Medicare Other | Admitting: *Deleted

## 2020-10-29 DIAGNOSIS — J309 Allergic rhinitis, unspecified: Secondary | ICD-10-CM | POA: Diagnosis not present

## 2020-11-06 ENCOUNTER — Ambulatory Visit (INDEPENDENT_AMBULATORY_CARE_PROVIDER_SITE_OTHER): Payer: Medicare Other | Admitting: *Deleted

## 2020-11-06 DIAGNOSIS — J309 Allergic rhinitis, unspecified: Secondary | ICD-10-CM | POA: Diagnosis not present

## 2020-11-08 DIAGNOSIS — J3089 Other allergic rhinitis: Secondary | ICD-10-CM | POA: Diagnosis not present

## 2020-11-12 ENCOUNTER — Ambulatory Visit (INDEPENDENT_AMBULATORY_CARE_PROVIDER_SITE_OTHER): Payer: Medicare Other

## 2020-11-12 ENCOUNTER — Ambulatory Visit: Payer: Medicare Other | Admitting: Allergy

## 2020-11-12 DIAGNOSIS — J309 Allergic rhinitis, unspecified: Secondary | ICD-10-CM

## 2020-11-12 NOTE — Progress Notes (Signed)
VIAL MADE. EXP 11-12-21

## 2020-11-13 DIAGNOSIS — J4550 Severe persistent asthma, uncomplicated: Secondary | ICD-10-CM | POA: Diagnosis not present

## 2020-11-14 ENCOUNTER — Encounter: Payer: Self-pay | Admitting: Allergy

## 2020-11-14 ENCOUNTER — Other Ambulatory Visit: Payer: Self-pay

## 2020-11-14 ENCOUNTER — Ambulatory Visit (INDEPENDENT_AMBULATORY_CARE_PROVIDER_SITE_OTHER): Payer: Medicare Other | Admitting: Allergy

## 2020-11-14 ENCOUNTER — Ambulatory Visit: Payer: Medicare Other | Admitting: *Deleted

## 2020-11-14 VITALS — BP 144/78 | HR 84 | Resp 16 | Ht 63.0 in | Wt 231.8 lb

## 2020-11-14 DIAGNOSIS — J4540 Moderate persistent asthma, uncomplicated: Secondary | ICD-10-CM

## 2020-11-14 DIAGNOSIS — J4550 Severe persistent asthma, uncomplicated: Secondary | ICD-10-CM | POA: Diagnosis not present

## 2020-11-14 DIAGNOSIS — K219 Gastro-esophageal reflux disease without esophagitis: Secondary | ICD-10-CM | POA: Diagnosis not present

## 2020-11-14 DIAGNOSIS — J3089 Other allergic rhinitis: Secondary | ICD-10-CM | POA: Diagnosis not present

## 2020-11-14 MED ORDER — SPIRIVA HANDIHALER 18 MCG IN CAPS: 18.0000 ug | ORAL_CAPSULE | RESPIRATORY_TRACT | 5 refills | Status: DC

## 2020-11-14 MED ORDER — EPINEPHRINE 0.3 MG/0.3ML IJ SOAJ: 0.3000 mg | INTRAMUSCULAR | 1 refills | Status: DC | PRN

## 2020-11-14 NOTE — Patient Instructions (Addendum)
Asthma: Check with insurance the cost of Trelegy 2107mg 1 puff once a day. This would replace your Symbicort AND Spiriva inhaler.  Daily controller medication(s): continue with Symbicort 1652m 2 puffs twice a day with spacer and rinse mouth afterwards. Continue Spiriva daily. Continue montelukast '10mg'$  daily. Continue Xolair '300mg'$  every 4 weeks - given today. During upper respiratory infections/asthma flares: start Armonair Digihaler 11365m1 puff twice a day and rinse mouth after each use for 1-2 weeks until your breathing symptoms return to baseline. Sample given.  May use albuterol rescue inhaler 2 puffs every 4 to 6 hours as needed for shortness of breath, chest tightness, coughing, and wheezing. May use albuterol rescue inhaler 2 puffs 5 to 15 minutes prior to strenuous physical activities. Monitor frequency of use.  Asthma control goals:  Full participation in all desired activities (may need albuterol before activity) Albuterol use two times or less a week on average (not counting use with activity) Cough interfering with sleep two times or less a month Oral steroids no more than once a year No hospitalizations  Allergic rhinitis Continue environmental control measures (weed, molds) Continue allergy injections weekly.  Continue montelukast '10mg'$  daily and allegra '180mg'$  daily.  May use azelastine 1-2 sprays per nostril at night for drainage. May use Flonase 1-2 sprays per nostril in the morning for nasal congestion. Check with your eye doctor about using Flonase with your glaucoma.  Reflux Continue with omeprazole '20mg'$  in the morning.  Follow up in 4 months or sooner if needed.   Reducing Pollen Exposure Pollen seasons: trees (spring), grass (summer) and ragweed/weeds (fall). Keep windows closed in your home and car to lower pollen exposure.  Install air conditioning in the bedroom and throughout the house if possible.  Avoid going out in dry windy days - especially early  morning. Pollen counts are highest between 5 - 10 AM and on dry, hot and windy days.  Save outside activities for late afternoon or after a heavy rain, when pollen levels are lower.  Avoid mowing of grass if you have grass pollen allergy. Be aware that pollen can also be transported indoors on people and pets.  Dry your clothes in an automatic dryer rather than hanging them outside where they might collect pollen.  Rinse hair and eyes before bedtime. Mold Control Mold and fungi can grow on a variety of surfaces provided certain temperature and moisture conditions exist.  Outdoor molds grow on plants, decaying vegetation and soil. The major outdoor mold, Alternaria and Cladosporium, are found in very high numbers during hot and dry conditions. Generally, a late summer - fall peak is seen for common outdoor fungal spores. Rain will temporarily lower outdoor mold spore count, but counts rise rapidly when the rainy period ends. The most important indoor molds are Aspergillus and Penicillium. Dark, humid and poorly ventilated basements are ideal sites for mold growth. The next most common sites of mold growth are the bathroom and the kitchen. Outdoor (Seasonal) Mold Control Use air conditioning and keep windows closed. Avoid exposure to decaying vegetation. Avoid leaf raking. Avoid grain handling. Consider wearing a face mask if working in moldy areas.  Indoor (Perennial) Mold Control  Maintain humidity below 50%. Get rid of mold growth on hard surfaces with water, detergent and, if necessary, 5% bleach (do not mix with other cleaners). Then dry the area completely. If mold covers an area more than 10 square feet, consider hiring an indoor environmental professional. For clothing, washing with soap and water  is best. If moldy items cannot be cleaned and dried, throw them away. Remove sources e.g. contaminated carpets. Repair and seal leaking roofs or pipes. Using dehumidifiers in damp basements may  be helpful, but empty the water and clean units regularly to prevent mildew from forming. All rooms, especially basements, bathrooms and kitchens, require ventilation and cleaning to deter mold and mildew growth. Avoid carpeting on concrete or damp floors, and storing items in damp areas.

## 2020-11-14 NOTE — Assessment & Plan Note (Signed)
Past history - started AIT on W-M before 2016. Interim history - prefers weekly injections over biweekly.  Continue environmental control measures (weed, molds)  Continue allergy injections weekly.   Continue montelukast '10mg'$  daily and allegra '180mg'$  daily.   May use azelastine 1-2 sprays per nostril at night for drainage.  May use Flonase 1-2 sprays per nostril in the morning for nasal congestion.  Check with your eye doctor about using Flonase with your glaucoma.

## 2020-11-14 NOTE — Progress Notes (Signed)
Follow Up Note  RE: Melanie Armstrong MRN: [id number] DOB: 10/08/1955 Date of Office Visit: 11/14/2020  Referring provider: [name], MD Primary care provider: [name], MD  Chief Complaint: Asthma (Xolair today. Says the weather has caused issues. States no changes)  History of Present Illness: I had the pleasure of seeing Melanie Armstrong for a follow up visit at the [facility] on 11/14/2020. She is a 65 y.o. female, who is being followed for asthma, allergic rhinitis on AIT. Her previous allergy office visit was on 07/11/2020 with Dr. [name]. Today is a regular follow up visit.  Severe persistent asthma Currently on Xolair '300mg'$  every 4 weeks with no issues. Currently on Symbicort 147mg 2 puffs twice a day and Spiriva 1 capsule daily. Inhalers total cost over $200 per month.  Still taking montelukast daily.  Patient had bronchitis in the spring requiring albuterol nebulizer, prednisone and antibiotics. Usually has bronchitis twice per year.   Allergic rhinitis Patient continues with weekly injections with good benefit.  Using Flonase 2 spray per nostril in the morning and astepro 1 spray per nostril at night. No nosebleeds. Takes allegra in the mornings.    Gastroesophageal reflux disease Stable.  Assessment and Plan: Melanie Armstrong a 65y.o. female with: Severe persistent asthma, uncomplicated Stable with below regimen. Patient pays about $200+ per month for her inhalers. Had 1 course of prednisone in the spring due to bronchitis requiring antibiotics as well. Today's spirometry was normal.  Check with insurance the cost of Trelegy 2038m 1 puff once a day. This would replace your Symbicort AND Spiriva inhaler. Daily controller medication(s): continue with Symbicort 16067m2 puffs twice a day with spacer and rinse mouth afterwards. Continue Spiriva 1 capsule daily. Continue montelukast '10mg'$  daily. Continue Xolair '300mg'$  every 4 weeks - given today. During  upper respiratory infections/asthma flares: start Armonair Digihaler 113m59m puff twice a day and rinse mouth after each use for 1-2 weeks until your breathing symptoms return to baseline. Sample given. Demonstrated proper use.  May use albuterol rescue inhaler 2 puffs every 4 to 6 hours as needed for shortness of breath, chest tightness, coughing, and wheezing. May use albuterol rescue inhaler 2 puffs 5 to 15 minutes prior to strenuous physical activities. Monitor frequency of use.  Get spirometry at next visit.  Allergic rhinitis Past history - started AIT on W-M before 2016. Interim history - prefers weekly injections over biweekly. Continue environmental control measures (weed, molds) Continue allergy injections weekly.  Continue montelukast '10mg'$  daily and allegra '180mg'$  daily.  May use azelastine 1-2 sprays per nostril at night for drainage. May use Flonase 1-2 sprays per nostril in the morning for nasal congestion. Check with your eye doctor about using Flonase with your glaucoma.  Gastroesophageal reflux disease Stable. Continue with omeprazole '20mg'$  in the morning.  Return in about 4 months (around 03/16/2021).  Meds ordered this encounter  Medications   EPINEPHrine (EPIPEN 2-PAK) 0.3 mg/0.3 mL IJ SOAJ injection    Sig: Inject 0.3 mg into the muscle as needed for anaphylaxis. USE AS DIRECTED FOR SEVERE ALLERGIC REACTION    Dispense:  1 each    Refill:  1    Please dispense Mylan brand generic only. Thank you.   tiotropium (SPIRIVA HANDIHALER) 18 MCG inhalation capsule    Sig: Place 1 capsule (18 mcg total) into inhaler and inhale 1 day or 1 dose.    Dispense:  30 capsule    Refill:  5  Lab Orders  No laboratory test(s) ordered today    Diagnostics: Spirometry:  Tracings reviewed. Her effort: Good reproducible efforts. FVC: 2.52L FEV1: 2.18L, 97% predicted FEV1/FVC ratio: 87% Interpretation: Spirometry consistent with normal pattern.  Please see scanned spirometry  results for details.  Medication List:  Current Outpatient Medications  Medication Sig Dispense Refill   albuterol (PROAIR HFA) 108 (90 Base) MCG/ACT inhaler Inhale 2 puffs into the lungs every 4 (four) hours as needed. 18 g 1   albuterol (PROVENTIL) (2.5 MG/3ML) 0.083% nebulizer solution USE 1 VIAL IN NEBULIZER EVERY 4 HOURS AS NEEDED FOR WHEEZING OR  SHORTNESS  OF  BREATH 600 mL 0   azelastine (ASTELIN) 0.1 % nasal spray Place into both nostrils.     benzonatate (TESSALON) 200 MG capsule benzonatate 200 mg capsule  TAKE ONE CAPSULE THREE TIMES DAILY     budesonide-formoterol (SYMBICORT) 160-4.5 MCG/ACT inhaler Inhale 2 puffs into the lungs 2 (two) times daily. 1 Inhaler 5   calcium-vitamin D (OSCAL WITH D) 500-200 MG-UNIT per tablet Take 1 tablet by mouth daily with breakfast.     diazepam (VALIUM) 5 MG tablet Take 5 mg by mouth every 6 (six) hours as needed for anxiety.     Ferrous Sulfate (IRON) 325 (65 Fe) MG TABS      fexofenadine (ALLEGRA) 180 MG tablet Take 180 mg by mouth daily.     fluticasone (FLONASE) 50 MCG/ACT nasal spray PLACE 1-2 SPRAYS INTO BOTH NOSTRILS DAILY AS NEEDED FOR ALLERGIES OR RHINITIS. 16 mL 2   furosemide (LASIX) 20 MG tablet Take 20 mg by mouth every morning.     glucosamine-chondroitin 500-400 MG tablet Take 1 tablet by mouth 2 (two) times daily.     lisinopril (ZESTRIL) 20 MG tablet Take 40 mg by mouth daily.     LUMIGAN 0.01 % SOLN SMARTSIG:In Eye(s)     montelukast (SINGULAIR) 10 MG tablet Take 1 tablet (10 mg total) by mouth at bedtime. 30 tablet 5   Multiple Vitamin (MULTIVITAMIN WITH MINERALS) TABS tablet Take 1 tablet by mouth daily.     Omega-3 Fatty Acids (OMEGA 3 PO) Take 1 capsule by mouth daily.     omeprazole (PRILOSEC) 20 MG capsule Take 1 capsule (20 mg total) by mouth daily. 30 capsule 5   PRESCRIPTION MEDICATION once a week.     vitamin C (ASCORBIC ACID) 500 MG tablet Take 500 mg by mouth daily.     EPINEPHrine (EPIPEN 2-PAK) 0.3 mg/0.3 mL IJ  SOAJ injection Inject 0.3 mg into the muscle as needed for anaphylaxis. USE AS DIRECTED FOR SEVERE ALLERGIC REACTION 1 each 1   tiotropium (SPIRIVA HANDIHALER) 18 MCG inhalation capsule Place 1 capsule (18 mcg total) into inhaler and inhale 1 day or 1 dose. 30 capsule 5   Current Facility-Administered Medications  Medication Dose Route Frequency Provider Last Rate Last Admin   omalizumab Arvid Right) injection 300 mg  300 mg Subcutaneous Q28 days Jiles Prows, MD   300 mg at 11/14/20 1120   Allergies: Allergies  Allergen Reactions   Advair Diskus [Fluticasone-Salmeterol]     Per patient, she thinks she developed walking PNA.    Avelox [Moxifloxacin Hcl In Nacl] Other (See Comments)    Muscle Aches   Irbesartan Other (See Comments)   Levofloxacin Other (See Comments)    Tendon pain   Olmesartan Other (See Comments)   Other Other (See Comments)   Protonix [Pantoprazole Sodium] Diarrhea   I reviewed her past medical history, social  history, family history, and environmental history and no significant changes have been reported from her previous visit.  Review of Systems  Constitutional:  Negative for appetite change, chills, fever and unexpected weight change.  HENT:  Negative for congestion and rhinorrhea.   Eyes:  Negative for itching.  Respiratory:  Negative for cough, chest tightness, shortness of breath and wheezing.   Gastrointestinal:  Negative for abdominal pain.  Skin:  Negative for rash.  Allergic/Immunologic: Positive for environmental allergies.  Neurological:  Negative for headaches.   Objective: BP (!) 144/78   Pulse 84   Resp 16   Ht '5\' 3"'$  (1.6 m)   Wt 231 lb 12.8 oz (105.1 kg)   SpO2 96%   BMI 41.06 kg/m  Body mass index is 41.06 kg/m. Physical Exam Vitals and nursing note reviewed.  Constitutional:      Appearance: Normal appearance. She is well-developed.  HENT:     Head: Normocephalic and atraumatic.     Right Ear: Tympanic membrane and external ear  normal.     Left Ear: Tympanic membrane and external ear normal.     Nose: Nose normal. No congestion or rhinorrhea.     Mouth/Throat:     Mouth: Mucous membranes are moist.     Pharynx: Oropharynx is clear.  Eyes:     Conjunctiva/sclera: Conjunctivae normal.  Cardiovascular:     Rate and Rhythm: Normal rate and regular rhythm.     Heart sounds: Normal heart sounds. No murmur heard. Pulmonary:     Effort: Pulmonary effort is normal.     Breath sounds: Normal breath sounds. No wheezing, rhonchi or rales.  Musculoskeletal:     Cervical back: Neck supple.  Skin:    General: Skin is warm.     Findings: No rash.  Neurological:     Mental Status: She is alert and oriented to person, place, and time.  Psychiatric:        Behavior: Behavior normal.   Previous notes and tests were reviewed. The plan was reviewed with the patient/family, and all questions/concerned were addressed.  It was my pleasure to see Melanie Armstrong today and participate in her care. Please feel free to contact me with any questions or concerns.  Sincerely,  Rexene Alberts, DO Allergy & Immunology  [facility] office: [place] office: [phone]

## 2020-11-14 NOTE — Assessment & Plan Note (Signed)
Stable.  Continue with omeprazole 20mg  in the morning.

## 2020-11-14 NOTE — Assessment & Plan Note (Addendum)
Stable with below regimen. Patient pays about $200+ per month for her inhalers. Had 1 course of prednisone in the spring due to bronchitis requiring antibiotics as well. . Today's spirometry was normal.  . Check with insurance the cost of Trelegy 248mg 1 puff once a day. o This would replace your Symbicort AND Spiriva inhaler. . Daily controller medication(s): continue with Symbicort 1618m 2 puffs twice a day with spacer and rinse mouth afterwards. o Continue Spiriva 1 capsule daily. o Continue montelukast '10mg'$  daily. o Continue Xolair '300mg'$  every 4 weeks - given today. . During upper respiratory infections/asthma flares: start Armonair Digihaler 11337m1 puff twice a day and rinse mouth after each use for 1-2 weeks until your breathing symptoms return to baseline. Sample given. Demonstrated proper use.  . May use albuterol rescue inhaler 2 puffs every 4 to 6 hours as needed for shortness of breath, chest tightness, coughing, and wheezing. May use albuterol rescue inhaler 2 puffs 5 to 15 minutes prior to strenuous physical activities. Monitor frequency of use.  . Get spirometry at next visit.

## 2020-11-20 ENCOUNTER — Ambulatory Visit (INDEPENDENT_AMBULATORY_CARE_PROVIDER_SITE_OTHER): Payer: Medicare Other | Admitting: *Deleted

## 2020-11-20 DIAGNOSIS — J309 Allergic rhinitis, unspecified: Secondary | ICD-10-CM

## 2020-11-27 ENCOUNTER — Ambulatory Visit (INDEPENDENT_AMBULATORY_CARE_PROVIDER_SITE_OTHER): Payer: Medicare Other | Admitting: *Deleted

## 2020-11-27 DIAGNOSIS — J309 Allergic rhinitis, unspecified: Secondary | ICD-10-CM | POA: Diagnosis not present

## 2020-12-04 ENCOUNTER — Ambulatory Visit (INDEPENDENT_AMBULATORY_CARE_PROVIDER_SITE_OTHER): Payer: Medicare Other | Admitting: *Deleted

## 2020-12-04 DIAGNOSIS — J309 Allergic rhinitis, unspecified: Secondary | ICD-10-CM

## 2020-12-06 ENCOUNTER — Other Ambulatory Visit: Payer: Self-pay | Admitting: Obstetrics & Gynecology

## 2020-12-06 DIAGNOSIS — N950 Postmenopausal bleeding: Secondary | ICD-10-CM

## 2020-12-10 ENCOUNTER — Ambulatory Visit (INDEPENDENT_AMBULATORY_CARE_PROVIDER_SITE_OTHER): Payer: Medicare Other

## 2020-12-10 DIAGNOSIS — J309 Allergic rhinitis, unspecified: Secondary | ICD-10-CM

## 2020-12-11 DIAGNOSIS — J4540 Moderate persistent asthma, uncomplicated: Secondary | ICD-10-CM | POA: Diagnosis not present

## 2020-12-12 ENCOUNTER — Ambulatory Visit (INDEPENDENT_AMBULATORY_CARE_PROVIDER_SITE_OTHER): Payer: Medicare Other

## 2020-12-12 ENCOUNTER — Other Ambulatory Visit: Payer: Self-pay

## 2020-12-12 DIAGNOSIS — J4540 Moderate persistent asthma, uncomplicated: Secondary | ICD-10-CM | POA: Diagnosis not present

## 2020-12-18 ENCOUNTER — Ambulatory Visit (INDEPENDENT_AMBULATORY_CARE_PROVIDER_SITE_OTHER): Payer: Medicare Other

## 2020-12-18 DIAGNOSIS — J309 Allergic rhinitis, unspecified: Secondary | ICD-10-CM

## 2020-12-20 ENCOUNTER — Ambulatory Visit
Admission: RE | Admit: 2020-12-20 | Discharge: 2020-12-20 | Disposition: A | Discharge status: Home / Self Care | Payer: Medicare Other | Source: Ambulatory Visit | Attending: Internal Medicine | Admitting: Internal Medicine

## 2020-12-20 DIAGNOSIS — R911 Solitary pulmonary nodule: Secondary | ICD-10-CM

## 2020-12-20 DIAGNOSIS — Q859 Phakomatosis, unspecified: Secondary | ICD-10-CM

## 2020-12-20 DIAGNOSIS — Z8709 Personal history of other diseases of the respiratory system: Secondary | ICD-10-CM

## 2020-12-25 ENCOUNTER — Ambulatory Visit (INDEPENDENT_AMBULATORY_CARE_PROVIDER_SITE_OTHER): Payer: Medicare Other | Admitting: *Deleted

## 2020-12-25 DIAGNOSIS — J309 Allergic rhinitis, unspecified: Secondary | ICD-10-CM

## 2021-01-01 ENCOUNTER — Ambulatory Visit (INDEPENDENT_AMBULATORY_CARE_PROVIDER_SITE_OTHER): Payer: Medicare Other | Admitting: *Deleted

## 2021-01-01 DIAGNOSIS — J309 Allergic rhinitis, unspecified: Secondary | ICD-10-CM

## 2021-01-04 ENCOUNTER — Ambulatory Visit (INDEPENDENT_AMBULATORY_CARE_PROVIDER_SITE_OTHER): Payer: Medicare Other | Admitting: Internal Medicine

## 2021-01-04 ENCOUNTER — Encounter: Payer: Self-pay | Admitting: Internal Medicine

## 2021-01-04 ENCOUNTER — Other Ambulatory Visit: Payer: Self-pay

## 2021-01-04 VITALS — BP 130/70 | HR 92 | Temp 97.7°F | Ht 63.0 in | Wt 234.8 lb

## 2021-01-04 DIAGNOSIS — Z8709 Personal history of other diseases of the respiratory system: Secondary | ICD-10-CM

## 2021-01-04 DIAGNOSIS — Q859 Phakomatosis, unspecified: Secondary | ICD-10-CM | POA: Diagnosis not present

## 2021-01-04 DIAGNOSIS — R911 Solitary pulmonary nodule: Secondary | ICD-10-CM

## 2021-01-04 NOTE — Progress Notes (Signed)
HPI  IOV 09/08/2017  Chief Complaint  Patient presents with   Pulm Consult    Referred by Dr. Veverly Fells for left pulmonary nodules. Per patient, has 2 nodules on the left. Patient has a history of asthma and SOB.      Melanie Armstrong is a 65 year old non-smoker who works on a desk job doing Microbiologist.  She is here referred for left lung nodules.  The background history is that she has been on lisinopril for 15 years.  She carries a diagnosis of asthma allergic that is managed by our local allergist.  She tells me that she is been on Symbicort for at least 10 years and Spiriva for at least 5 years and the last couple of years Xolair.  She describes herself as severe persistent asthma.  Her recent spirometry May 2019 personal visualization shows no normal FEV1 and FVC.  Earlier in spring 2019 she had respiratory infection following exposure to sick contacts at work.  Since then she has had significant cough and bronchitis requiring at least 2 rounds of prednisone and antibiotics.  Things are improving and currently the cough is rated as a 3 out of 10.  She tells me as part of this persistent bronchitic cough symptoms she did have a chest x-ray that showed left lung nodule that then resulted in the CT scan of the chest Aug 20, 2017 that shows a 1.7 cm left lung nodule that is dense and I personally visualized and agree with the radiologist interpretation that this is a hamartoma.  There is another 9 mm subpleural left lower lobe posterior segment nodule and that the main reason for the referral today.  The CT scan also shows coronary artery calcification but she denies any chest pain.  She is a non-smoker.  Although when she was a kid her parents smoked.   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   Indeterminate 9 mm pulmonary nodule in posterior left lower lobe. Consider one of the following  in 3 months for both low-risk and high-risk individuals: (a) repeat chest CT, (b) follow-up PET-CT, or (c) tissue sampling. This recommendation follows the consensus statement: Guidelines for Management of Incidental Pulmonary Nodules Detected on CT Images: From the Fleischner Society 2017; Radiology 2017; 284:228-243.   Incidental findings: Aortic Atherosclerosis (ICD10-I70.0). Coronary artery calcification. Tiny hiatal hernia. Small benign right adrenal adenoma.     Electronically Signed   By: Earle Gell M.D.   On: 08/21/2017 10:50   OV 11/30/2017  Chief Complaint  Patient presents with   Follow-up    CT 8/16.  Pt also had cards referral 8/9. Pt states she has been doing Armstrong since last visit and denies any complaints.     Follow-up lung nodules -  9 mm left lower lobe in the setting of stable 1.7 cm benign left lower lobe hamartoma  -  history of asthma followed by allergist.  Is a routine follow-up. She had a 3 month CT scan of the chest that is documented below. The 9 mm left lower lobe nodule is stable but in May 2019 and August 2019. The left lower lobe, was also stable. In terms of asthma she follows with an allergist. She is on ACE inhibitor but she says she has no cough and she does not feel a need for medication to be discontinued.   Ct Chest Wo Contrast  Result Date:  11/27/2017 CLINICAL DATA:  Pulmonary nodule follow-up. EXAM: CT CHEST WITHOUT CONTRAST TECHNIQUE: Multidetector CT imaging of the chest was performed following the standard protocol without IV contrast. COMPARISON:  08/20/2017 FINDINGS: Cardiovascular: The heart size is normal. No substantial pericardial effusion. Coronary artery calcification is evident. Atherosclerotic calcification is noted in the wall of the thoracic aorta. Mediastinum/Nodes: No mediastinal lymphadenopathy. No evidence for gross hilar lymphadenopathy although assessment is limited by the lack of intravenous contrast on today's study. The  esophagus has normal imaging features. There is no axillary lymphadenopathy. Lungs/Pleura: The central tracheobronchial airways are patent. Scattered peripheral tiny pulmonary nodules are similar to prior. 1.7 cm nodule left lower lobe contains central fat density consistent with benign hamartoma. 9 mm peripheral left lower lobe nodule seen on the prior study persists at 9 mm today (3:109). No pleural effusion. Upper Abdomen: Stable 15 mm right adrenal adenoma. Musculoskeletal: No worrisome lytic or sclerotic osseous abnormality. IMPRESSION: 1. 9 mm left lower lobe pulmonary nodule is stable in the interval. Three-month stable imaging follow-up is reassuring. Repeat CT chest without contrast in 6-12 months recommended to ensure continued stability. 2. Stable 1.7 cm benign pulmonary hamartoma. 3. No change scattered tiny peripheral nodules in both lungs. 4.  Aortic Atherosclerois (ICD10-170.0) Electronically Signed   By: Misty Stanley M.D.   On: 11/27/2017 16:11   OV 12/13/2018  Subjective:  Patient ID: Melanie Armstrong, female , DOB: [birth year] , age 21 y.o. , MRN: [id number] , ADDRESS: [place] [id number]   12/13/2018 -   Chief Complaint  Patient presents with   Nodule of lower lobe of left lung    Follow-up lung nodules -  9 mm left lower lobe in the setting of separate stable 1.7 cm benign left lower lobe hamartoma  - Hx of  history of asthma followed by allergist. - nasal inhalers, symbicort, spiriva, xolair - Also on ace inhibitors without cough   HPI Melanie Armstrong 65 y.o. -is now over 1 year since  last saw [name]  Overall she is stable and doing Armstrong.  No major issues in the interim.  She is due for a flu shot and is willing to have it today.  She had a CT scan of the chest October 19, 2018 as a follow-up to August 29 CT scan of the chest.  The left lower lobe 0.8/0.9 cm pulmonary nodule is stable.  Her hematoma is also stable.  She does have coronary artery atherosclerosis.   She says she has seen Dr. Irish Lack and has been reassured.  She has allergic asthma and sees an outside allergist.  She is on nasal inhalers, Symbicort, Spiriva and Xolair.  She is also on ACE inhibitor lisinopril.  She states this does not cause cough or make the asthma worse    IMPRESSION: CT chest 10/19/2018 1. Irregular posterior left lower lobe 0.8 cm pulmonary nodule is stable since 08/20/2017 chest CT, more likely benign. Suggest continued follow-up chest CT in 12 months given the irregular morphology of this nodule. 2. Separate stable benign left lower lobe pulmonary hamartoma. 3. Three-vessel coronary atherosclerosis. 4. Small hiatal hernia.   Aortic Atherosclerosis (ICD10-I70.0).     Electronically Signed   By: Ilona Sorrel M.D.   On: 10/19/2018 14:09 ROS - per HPI    OV 12/26/2019  Subjective:  Patient ID: Melanie Armstrong, female , DOB: Aug 19, 1955 , age 89 y.o. , MRN: [id number] , ADDRESS: [place] [id number]  12/26/2019 -   Chief Complaint  Patient presents with   Follow-up    go over ct results   Follow-up lung nodules -  9 mm left lower lobe in the setting of separate stable 1.7 cm benign left lower lobe hamartoma  - Hx of  history of asthma followed by allergist. - nasal inhalers, symbicort, spiriva, xolair - Also on ace inhibitors without cough   HPI Melanie Armstrong 65 y.o. -presents for follow-up of the above issues.  Her asthma is under Armstrong control.  She is on Spiriva Symbicort and Xolair.  She does not want to reduce this.  She uses albuterol for rescue once a week.  Her allergist manages this.  She had a CT scan of the chest in August 2021 for her left lower lobe hamartoma in the chest and also left lower lobe nodule.  These are stable.  There are some spiculation reported around her nodule in the left lower lobe therefore the radiologist recommended doing another CT scan in 1 year.  This is despite the fact the nodule has been stable for 2  years.  I discussed with her the risks of getting another CT scan such as radiation but she is willing to have another CT scan done in 1 year.  There are no other active complaints.  She has had a flu shot and also the Covid vaccine.   IMPRESSION: 1. 7 x 7 mm LEFT lower lobe pulmonary nodule with irregular margins measures 7 x 7 mm on the study of 08/20/2017. Stability over 2 years time argues for benign etiology. Given irregular margins could consider continued surveillance at a 12 month interval. 2. Scattered small pulmonary nodules throughout the upper lobes also without interval change, potentially related to prior infection or inflammation. 3. Presumed pulmonary hamartoma is unchanged in the LEFT chest measuring approximately 1.9 x 1.9 cm. 4. Three-vessel coronary artery disease. 5. Small hiatal hernia. 6. Aortic atherosclerosis.   Aortic Atherosclerosis (ICD10-I70.0).     Electronically Signed   By: Zetta Bills M.D.   On: 11/28/2019 13:47      OV 01/04/2021  Subjective:  Patient ID: Melanie Armstrong, female , DOB: 1955/09/07 , age 88 y.o. , MRN: [id number] , ADDRESS: [place] [id number] PCP [name], MD Patient Care Team: [name], MD as PCP - General (Family Medicine)  This Provider for this visit: Treatment Team:  Attending Provider: Brand Males, MD    01/04/2021 -   Chief Complaint  Patient presents with   Follow-up    Pt is here today to discuss results of recent CT. States she has been doing okay since last visit and denies any complaints.   Follow-up lung nodules -  9 mm left lower lobe in the setting of separate stable 1.7 cm benign left lower lobe hamartoma  - Hx of  history of asthma followed by allergist. - nasal inhalers, symbicort, spiriva, xolair - Also on ace inhibitors without cough -New right upper lobe micronodules in the setting of infectious symptoms September 2022  HPI Melanie Armstrong 65 y.o. -returns  for follow-up.  Her asthma and allergies being maintained by Dr. Kenton Kingfisher and also her allergist Dr. [name].  She tells me that currently her asthma is active and she is going to finish day 14 of clarithromycin shortly.  She is also going to do 5 days of prednisone starting today from her primary care physician.  In the context of this she had  a CT scan of the chest without contrast.  She has new micronodules in the right upper lobe anterior region right behind the right breast on the image.  These look very infectious.  Her old left lower lobe nodule and her left lower lobe hematoma are stable in my personal visualization.  I showed her these images.  She had a question about switching her Symbicort and Spiriva to Trelegy.  Apparently her allergist has recommended this.  I have supported this plan.   Of note her daughter is Julyssa Kyer who is a Marine scientist in [address] [employer]:  Polyp lung nodule   EXAM: CT CHEST WITHOUT CONTRAST   TECHNIQUE: Multidetector CT imaging of the chest was performed following the standard protocol without IV contrast.   COMPARISON:  CT chest dated July 29, 2019   FINDINGS: Cardiovascular: Normal heart size. No pericardial effusion. Three-vessel coronary artery calcifications. Atherosclerotic disease of the thoracic aorta.   Mediastinum/Nodes: Small hiatal hernia. Thyroid is unremarkable. No pathologically enlarged lymph nodes in the chest.   Lungs/Pleura: Central airways are patent. No consolidation, pleural effusion or pneumothorax. New clustered small nodules of the right upper lobe. Fat containing solid pulmonary nodule of the left lower lobe located on series 5, image 65 is unchanged in size compared to prior exam measuring 1.9 x 1.9 cm additional smaller juxtapleural nodule in the left lower lobe is unchanged in size compared to prior measuring 0.7 x 0.7 cm. On image 88.   Upper Abdomen: Cholecystectomy clips stable  right adrenal nodule measuring 1.2 cm, likely benign adenoma. Probable small mL the left kidney measuring 0.6 cm. No acute findings.   Musculoskeletal: No chest wall mass or suspicious bone lesions identified.   IMPRESSION: Stable solid left lower lobe pulmonary nodule measuring 0.7 cm.   New cluster of small nodules seen in the right upper lobe, likely infectious or inflammatory.   Stable left lower lobe pulmonary hamartoma.   Aortic Atherosclerosis (ICD10-I70.0).     Electronically Signed   By: Yetta Glassman M.D.   On: 12/21/2020 08:41        has a past medical history of Arthritis, Asthma, GERD (gastroesophageal reflux disease), Heart murmur, and Hypertension.   reports that she has never smoked. She has never used smokeless tobacco.  Past Surgical History:  Procedure Laterality Date   CHOLECYSTECTOMY     TONSILLECTOMY     TOTAL HIP ARTHROPLASTY Right 08/22/2014   Procedure: RIGHT TOTAL HIP ARTHROPLASTY ANTERIOR APPROACH;  Surgeon: Paralee Cancel, MD;  Location: [facility];  Service: Orthopedics;  Laterality: Right;   TOTAL HIP ARTHROPLASTY Left 01/2019    Allergies  Allergen Reactions   Advair Diskus [Fluticasone-Salmeterol]     Per patient, she thinks she developed walking PNA.    Avelox [Moxifloxacin Hcl In Nacl] Other (See Comments)    Muscle Aches   Irbesartan Other (See Comments)   Levofloxacin Other (See Comments)    Tendon pain   Olmesartan Other (See Comments)   Other Other (See Comments)   Protonix [Pantoprazole Sodium] Diarrhea    Immunization History  Administered Date(s) Administered   Influenza,inj,Quad PF,6+ Mos 01/26/2018, 12/13/2018   Influenza-Unspecified 01/13/2012, 12/16/2019   Pneumococcal Conjugate-13 04/14/2016   Tdap 11/12/2009    Family History  Problem Relation Age of Onset   Heart disease Father    Allergic rhinitis Neg Hx    Angioedema Neg Hx    Asthma Neg Hx    Eczema  Neg Hx    Immunodeficiency Neg Hx    Urticaria Neg Hx       Current Outpatient Medications:    albuterol (PROAIR HFA) 108 (90 Base) MCG/ACT inhaler, Inhale 2 puffs into the lungs every 4 (four) hours as needed., Disp: 18 g, Rfl: 1   albuterol (PROVENTIL) (2.5 MG/3ML) 0.083% nebulizer solution, USE 1 VIAL IN NEBULIZER EVERY 4 HOURS AS NEEDED FOR WHEEZING OR  SHORTNESS  OF  BREATH, Disp: 600 mL, Rfl: 0   azelastine (ASTELIN) 0.1 % nasal spray, Place into both nostrils., Disp: , Rfl:    benzonatate (TESSALON) 200 MG capsule, benzonatate 200 mg capsule  TAKE ONE CAPSULE THREE TIMES DAILY, Disp: , Rfl:    budesonide-formoterol (SYMBICORT) 160-4.5 MCG/ACT inhaler, Inhale 2 puffs into the lungs 2 (two) times daily., Disp: 1 Inhaler, Rfl: 5   calcium-vitamin D (OSCAL WITH D) 500-200 MG-UNIT per tablet, Take 1 tablet by mouth daily with breakfast., Disp: , Rfl:    clarithromycin (BIAXIN) 500 MG tablet, Take 500 mg by mouth 2 (two) times daily., Disp: , Rfl:    diazepam (VALIUM) 5 MG tablet, Take 5 mg by mouth every 6 (six) hours as needed for anxiety., Disp: , Rfl:    EPINEPHrine (EPIPEN 2-PAK) 0.3 mg/0.3 mL IJ SOAJ injection, Inject 0.3 mg into the muscle as needed for anaphylaxis. USE AS DIRECTED FOR SEVERE ALLERGIC REACTION, Disp: 1 each, Rfl: 1   Ferrous Sulfate (IRON) 325 (65 Fe) MG TABS, , Disp: , Rfl:    fexofenadine (ALLEGRA) 180 MG tablet, Take 180 mg by mouth daily., Disp: , Rfl:    fluticasone (FLONASE) 50 MCG/ACT nasal spray, PLACE 1-2 SPRAYS INTO BOTH NOSTRILS DAILY AS NEEDED FOR ALLERGIES OR RHINITIS., Disp: 16 mL, Rfl: 2   furosemide (LASIX) 20 MG tablet, Take 20 mg by mouth every morning., Disp: , Rfl:    glucosamine-chondroitin 500-400 MG tablet, Take 1 tablet by mouth 2 (two) times daily., Disp: , Rfl:    lisinopril (ZESTRIL) 20 MG tablet, Take 40 mg by mouth daily., Disp: , Rfl:    LUMIGAN 0.01 % SOLN, SMARTSIG:In Eye(s), Disp: , Rfl:    montelukast (SINGULAIR) 10 MG tablet, Take 1 tablet (10 mg total) by mouth at bedtime., Disp: 30  tablet, Rfl: 5   Multiple Vitamin (MULTIVITAMIN WITH MINERALS) TABS tablet, Take 1 tablet by mouth daily., Disp: , Rfl:    Omega-3 Fatty Acids (OMEGA 3 PO), Take 1 capsule by mouth daily., Disp: , Rfl:    omeprazole (PRILOSEC) 20 MG capsule, Take 1 capsule (20 mg total) by mouth daily., Disp: 30 capsule, Rfl: 5   PRESCRIPTION MEDICATION, once a week., Disp: , Rfl:    tiotropium (SPIRIVA HANDIHALER) 18 MCG inhalation capsule, Place 1 capsule (18 mcg total) into inhaler and inhale 1 day or 1 dose., Disp: 30 capsule, Rfl: 5   vitamin C (ASCORBIC ACID) 500 MG tablet, Take 500 mg by mouth daily., Disp: , Rfl:   Current Facility-Administered Medications:    omalizumab Arvid Right) injection 300 mg, 300 mg, Subcutaneous, Q28 days, Kozlow, Donnamarie Poag, MD, 300 mg at 12/12/20 1131      Objective:   Vitals:   01/04/21 1037  BP: 130/70  Pulse: 92  Temp: 97.7 F (36.5 C)  TempSrc: Oral  SpO2: 96%  Weight: 234 lb 12.8 oz (106.5 kg)  Height: 5\' 3"  (1.6 m)    Estimated body mass index is 41.59 kg/m as calculated from the following:   Height as of this  encounter: 5\' 3"  (1.6 m).   Weight as of this encounter: 234 lb 12.8 oz (106.5 kg).  @WEIGHTCHANGE @  [employer]   01/04/21 1037  Weight: 234 lb 12.8 oz (106.5 kg)     Physical Exam General: No distress.ok swell. Obese Neuro: Alert and Oriented x 3. GCS 15. Speech normal Psych: Pleasant Resp:  Barrel Chest - no.  Wheeze - no, Crackles - no, No overt respiratory distress CVS: Normal heart sounds. Murmurs - no Ext: Stigmata of Connective Tissue Disease - no HEENT: Normal upper airway. PEERL +. No post nasal drip        Assessment:       ICD-10-CM   1. Right upper lobe pulmonary nodule  R91.1     2. Nodule of lower lobe of left lung  R91.1     3. Pulmonary hamartoma ([place])  Q85.9     4. History of asthma  Z87.09          Plan:     Patient Instructions     ICD-10-CM   1. Nodule of lower lobe of left lung  R91.1   2. History  of asthma  Z87.09   3. Pulmonary hamartoma [facility])  Q85.9    New RUL micronodules Sept 2022  - likely to current ae-asthma and infectius symptoms  Plan  - antibiotics and pred per [name], MD - repeat CT chest without contrast in Sept 2023  Nodule of posterior lower lobe sub-pleural of left lung 69mm-9mm in CT may 10,2019 - > no change Aug 2019 -> July 2020 -> Aug 2021 -> sept 2022 - low prob for malignancy but radiology concerned for spiculation  Plan  -no further followo   Pulmonary hamartoma ([place]) -1.9cm  - this is seen in left lung lower part and stable in CT scan May 2019 and Aug 2019 and July 2020 and  Aug 2021  plan   no active folllowup  History of asthma  Armstrong controlled. On r spiriva and symbicoprt and xolair with plans to convert to trelegy + xolair  Plan - per allergist - I support change to trelegy    Followup Sept 2023  after CT chest    SIGNATURE    Dr. Brand Males, M.D., F.C.C.P,  Pulmonary and Critical Care Medicine Staff Physician, [place] Director - Interstitial Lung Disease  Program  Pulmonary [place] [facility] [place], [place], [address]  Pager: [phone], If no answer or between  15:00h - 7:00h: call [phone] Telephone: [phone]  11:28 AM 01/04/2021   PCP [name], MD

## 2021-01-04 NOTE — Patient Instructions (Addendum)
ICD-10-CM   1. Right upper lobe pulmonary nodule  R91.1     2. Nodule of lower lobe of left lung  R91.1     3. Pulmonary hamartoma ([place])  Q85.9     4. History of asthma  Z87.09       New RUL micronodules Sept 2022  - likely to current ae-asthma and infectius symptoms  Plan  - antibiotics and pred per [name], MD - repeat CT chest without contrast in Sept 2023  Nodule of posterior lower lobe sub-pleural of left lung 69mm-9mm in CT may 10,2019 - > no change Aug 2019 -> July 2020 -> Aug 2021 -> sept 2022 - low prob for malignancy but radiology concerned for spiculation  Plan  -no further followo   Pulmonary hamartoma ([place]) -1.9cm  - this is seen in left lung lower part and stable in CT scan May 2019 and Aug 2019 and July 2020 and  Aug 2021  plan   no active folllowup  History of asthma  Well controlled. On r spiriva and symbicoprt and xolair with plans to convert to trelegy + xolair  Plan - per allergist - I support change to trelegy    Followup Sept 2023  after CT chest

## 2021-01-07 ENCOUNTER — Ambulatory Visit (INDEPENDENT_AMBULATORY_CARE_PROVIDER_SITE_OTHER): Payer: Medicare Other

## 2021-01-07 DIAGNOSIS — J309 Allergic rhinitis, unspecified: Secondary | ICD-10-CM

## 2021-01-08 DIAGNOSIS — J4550 Severe persistent asthma, uncomplicated: Secondary | ICD-10-CM | POA: Diagnosis not present

## 2021-01-09 ENCOUNTER — Ambulatory Visit (INDEPENDENT_AMBULATORY_CARE_PROVIDER_SITE_OTHER): Payer: Medicare Other | Admitting: *Deleted

## 2021-01-09 ENCOUNTER — Ambulatory Visit: Payer: BC Managed Care – PPO | Admitting: Internal Medicine

## 2021-01-09 ENCOUNTER — Other Ambulatory Visit: Payer: Self-pay

## 2021-01-09 DIAGNOSIS — J4550 Severe persistent asthma, uncomplicated: Secondary | ICD-10-CM | POA: Diagnosis not present

## 2021-01-15 ENCOUNTER — Ambulatory Visit (INDEPENDENT_AMBULATORY_CARE_PROVIDER_SITE_OTHER): Payer: Medicare Other | Admitting: *Deleted

## 2021-01-15 DIAGNOSIS — J309 Allergic rhinitis, unspecified: Secondary | ICD-10-CM | POA: Diagnosis not present

## 2021-01-22 ENCOUNTER — Ambulatory Visit (INDEPENDENT_AMBULATORY_CARE_PROVIDER_SITE_OTHER): Payer: Medicare Other

## 2021-01-22 DIAGNOSIS — J309 Allergic rhinitis, unspecified: Secondary | ICD-10-CM | POA: Diagnosis not present

## 2021-01-23 ENCOUNTER — Ambulatory Visit
Admission: RE | Admit: 2021-01-23 | Discharge: 2021-01-23 | Disposition: A | Discharge status: Home / Self Care | Payer: Medicare Other | Source: Ambulatory Visit | Attending: Obstetrics & Gynecology | Admitting: Obstetrics & Gynecology

## 2021-01-23 DIAGNOSIS — N950 Postmenopausal bleeding: Secondary | ICD-10-CM

## 2021-01-28 DIAGNOSIS — J3089 Other allergic rhinitis: Secondary | ICD-10-CM | POA: Diagnosis not present

## 2021-01-28 NOTE — Progress Notes (Signed)
VIAL MADE. EXP 01-28-22

## 2021-01-29 ENCOUNTER — Ambulatory Visit (INDEPENDENT_AMBULATORY_CARE_PROVIDER_SITE_OTHER): Payer: Medicare Other

## 2021-01-29 DIAGNOSIS — J309 Allergic rhinitis, unspecified: Secondary | ICD-10-CM | POA: Diagnosis not present

## 2021-02-05 ENCOUNTER — Ambulatory Visit (INDEPENDENT_AMBULATORY_CARE_PROVIDER_SITE_OTHER): Payer: Medicare Other | Admitting: *Deleted

## 2021-02-05 DIAGNOSIS — J309 Allergic rhinitis, unspecified: Secondary | ICD-10-CM

## 2021-02-06 ENCOUNTER — Ambulatory Visit: Payer: Medicare Other

## 2021-02-06 DIAGNOSIS — J4550 Severe persistent asthma, uncomplicated: Secondary | ICD-10-CM | POA: Diagnosis not present

## 2021-02-07 ENCOUNTER — Other Ambulatory Visit: Payer: Self-pay

## 2021-02-07 ENCOUNTER — Ambulatory Visit (INDEPENDENT_AMBULATORY_CARE_PROVIDER_SITE_OTHER): Payer: Medicare Other | Admitting: *Deleted

## 2021-02-07 DIAGNOSIS — J4550 Severe persistent asthma, uncomplicated: Secondary | ICD-10-CM | POA: Diagnosis not present

## 2021-02-12 ENCOUNTER — Ambulatory Visit (INDEPENDENT_AMBULATORY_CARE_PROVIDER_SITE_OTHER): Payer: Medicare Other | Admitting: *Deleted

## 2021-02-12 DIAGNOSIS — J309 Allergic rhinitis, unspecified: Secondary | ICD-10-CM | POA: Diagnosis not present

## 2021-02-19 ENCOUNTER — Ambulatory Visit (INDEPENDENT_AMBULATORY_CARE_PROVIDER_SITE_OTHER): Payer: Medicare Other | Admitting: *Deleted

## 2021-02-19 DIAGNOSIS — J309 Allergic rhinitis, unspecified: Secondary | ICD-10-CM

## 2021-02-26 ENCOUNTER — Ambulatory Visit (INDEPENDENT_AMBULATORY_CARE_PROVIDER_SITE_OTHER): Payer: Medicare Other | Admitting: *Deleted

## 2021-02-26 DIAGNOSIS — J309 Allergic rhinitis, unspecified: Secondary | ICD-10-CM | POA: Diagnosis not present

## 2021-03-04 ENCOUNTER — Ambulatory Visit (INDEPENDENT_AMBULATORY_CARE_PROVIDER_SITE_OTHER): Payer: Medicare Other | Admitting: *Deleted

## 2021-03-04 DIAGNOSIS — J309 Allergic rhinitis, unspecified: Secondary | ICD-10-CM

## 2021-03-05 DIAGNOSIS — J4550 Severe persistent asthma, uncomplicated: Secondary | ICD-10-CM | POA: Diagnosis not present

## 2021-03-06 ENCOUNTER — Other Ambulatory Visit: Payer: Self-pay

## 2021-03-06 ENCOUNTER — Ambulatory Visit (INDEPENDENT_AMBULATORY_CARE_PROVIDER_SITE_OTHER): Payer: Medicare Other

## 2021-03-06 DIAGNOSIS — J4550 Severe persistent asthma, uncomplicated: Secondary | ICD-10-CM

## 2021-03-11 ENCOUNTER — Ambulatory Visit (INDEPENDENT_AMBULATORY_CARE_PROVIDER_SITE_OTHER): Payer: Medicare Other

## 2021-03-11 DIAGNOSIS — J309 Allergic rhinitis, unspecified: Secondary | ICD-10-CM

## 2021-03-18 ENCOUNTER — Ambulatory Visit (INDEPENDENT_AMBULATORY_CARE_PROVIDER_SITE_OTHER): Payer: Medicare Other | Admitting: Allergy

## 2021-03-18 ENCOUNTER — Encounter: Payer: Self-pay | Admitting: Allergy

## 2021-03-18 ENCOUNTER — Other Ambulatory Visit: Payer: Self-pay

## 2021-03-18 ENCOUNTER — Ambulatory Visit: Payer: Self-pay

## 2021-03-18 VITALS — BP 148/80 | HR 103 | Temp 97.3°F | Resp 18 | Ht 63.0 in | Wt 233.8 lb

## 2021-03-18 DIAGNOSIS — J309 Allergic rhinitis, unspecified: Secondary | ICD-10-CM | POA: Diagnosis not present

## 2021-03-18 DIAGNOSIS — J4550 Severe persistent asthma, uncomplicated: Secondary | ICD-10-CM | POA: Diagnosis not present

## 2021-03-18 DIAGNOSIS — J3089 Other allergic rhinitis: Secondary | ICD-10-CM

## 2021-03-18 DIAGNOSIS — K219 Gastro-esophageal reflux disease without esophagitis: Secondary | ICD-10-CM

## 2021-03-18 NOTE — Assessment & Plan Note (Signed)
Stable.  Continue with omeprazole 20mg  in the morning.

## 2021-03-18 NOTE — Progress Notes (Signed)
Follow Up Note  RE: Melanie Armstrong MRN: [id number] DOB: 10/17/1955 Date of Office Visit: 03/18/2021  Referring provider: [name], MD Primary care provider: [name], MD  Chief Complaint: Follow-up (Patient is in today for a follow up and she is doing well. States since re-starting the allergy shots she is better.)  History of Present Illness: I had the pleasure of seeing Melanie Armstrong for a follow up visit at the [facility] [place] [facility] on 03/18/2021. She is a 65 y.o. female, who is being followed for asthma on Xolair 300 mg every 4 weeks and allergic rhinitis on AIT and GERD. Her previous allergy office visit was on 11/14/2020 with Dr. [name]. Today is a regular follow up visit.  Severe persistent asthma Denies any ER/urgent care visits or prednisone use since the last visit.  Currently on Symbicort 146mcg 2 puffs twice a day and Spiriva daily.  Using albuterol about once per week with good benefit. Still takes Singulair daily and Xolair 300mg  every 4 weeks. No issues with the injections.   Not sure if she wants to switch to Trelegy as the above medication has been working so well. The Trelegy would cost her $50 less per month than her current inhaler combo.   Allergic rhinitis Restarted allergy injections and coming every week which seems to be helping. She tried every 2 weeks but had allergy flares. Using azelastine 1 spray per nostril QHS and Flonase 1 spray per nostril Qam. No nosebleeds. Patient has eye appointment tomorrow.   Gastroesophageal reflux disease Stable with omeprazole 20mg  in the morning.   Assessment and Plan: Melanie Armstrong is a 65 y.o. female with: Severe persistent asthma, uncomplicated Stable with below regimen. Hesitant about switching to Trelegy.  Today's spirometry was normal.  Daily controller medication(s): continue with Symbicort 140mcg 2 puffs twice a day with spacer and rinse mouth afterwards. Continue Spiriva 1 capsule daily. Continue  montelukast 10mg  daily. Continue Xolair 300mg  every 4 weeks. If you want to try Trelegy then ask the nurses when you come in for your injections for a sample. No sample in the office today.  Trelegy 248mcg 1 puff once a day and rinse mouth after each use.  During upper respiratory infections/asthma flares: start Armonair Digihaler 123mcg 1 puff twice a day and rinse mouth after each use for 1-2 weeks until your breathing symptoms return to baseline.  May use albuterol rescue inhaler 2 puffs every 4 to 6 hours as needed for shortness of breath, chest tightness, coughing, and wheezing. May use albuterol rescue inhaler 2 puffs 5 to 15 minutes prior to strenuous physical activities. Monitor frequency of use.  Get spirometry at next visit.  Allergic rhinitis Past history - started AIT on W-M before 2016. Interim history - doing well with weekly injections. Continue environmental control measures (weed, molds) Continue allergy injections weekly - given today. Continue montelukast 10mg  daily and allegra 180mg  daily.  May use azelastine 1-2 sprays per nostril at night for drainage. May use Flonase 1-2 sprays per nostril in the morning for nasal congestion. Check with your eye doctor about using Flonase with your glaucoma.  Gastroesophageal reflux disease Stable. Continue with omeprazole 20mg  in the morning.  Return in about 4 months (around 07/17/2021).  No orders of the defined types were placed in this encounter.  Lab Orders  No laboratory test(s) ordered today    Diagnostics: Spirometry:  Tracings reviewed. Her effort: It was hard to get consistent efforts and there is a question  as to whether this reflects a maximal maneuver. FVC: 2.46L FEV1: 2.12L, 95% predicted FEV1/FVC ratio: 85% Interpretation: Spirometry consistent with normal pattern.  Please see scanned spirometry results for details.  Medication List:  Current Outpatient Medications  Medication Sig Dispense Refill    albuterol (PROAIR HFA) 108 (90 Base) MCG/ACT inhaler Inhale 2 puffs into the lungs every 4 (four) hours as needed. 18 g 1   albuterol (PROVENTIL) (2.5 MG/3ML) 0.083% nebulizer solution USE 1 VIAL IN NEBULIZER EVERY 4 HOURS AS NEEDED FOR WHEEZING OR  SHORTNESS  OF  BREATH 600 mL 0   azelastine (ASTELIN) 0.1 % nasal spray Place into both nostrils.     benzonatate (TESSALON) 200 MG capsule benzonatate 200 mg capsule  TAKE ONE CAPSULE THREE TIMES DAILY     budesonide-formoterol (SYMBICORT) 160-4.5 MCG/ACT inhaler Inhale 2 puffs into the lungs 2 (two) times daily. 1 Inhaler 5   calcium-vitamin D (OSCAL WITH D) 500-200 MG-UNIT per tablet Take 1 tablet by mouth daily with breakfast.     diazepam (VALIUM) 5 MG tablet Take 5 mg by mouth every 6 (six) hours as needed for anxiety.     EPINEPHrine (EPIPEN 2-PAK) 0.3 mg/0.3 mL IJ SOAJ injection Inject 0.3 mg into the muscle as needed for anaphylaxis. USE AS DIRECTED FOR SEVERE ALLERGIC REACTION 1 each 1   Ferrous Sulfate (IRON) 325 (65 Fe) MG TABS      fexofenadine (ALLEGRA) 180 MG tablet Take 180 mg by mouth daily.     fluticasone (FLONASE) 50 MCG/ACT nasal spray PLACE 1-2 SPRAYS INTO BOTH NOSTRILS DAILY AS NEEDED FOR ALLERGIES OR RHINITIS. 16 mL 2   furosemide (LASIX) 20 MG tablet Take 20 mg by mouth every morning.     glucosamine-chondroitin 500-400 MG tablet Take 1 tablet by mouth 2 (two) times daily.     lisinopril (ZESTRIL) 20 MG tablet Take 40 mg by mouth daily.     LUMIGAN 0.01 % SOLN SMARTSIG:In Eye(s)     montelukast (SINGULAIR) 10 MG tablet Take 1 tablet (10 mg total) by mouth at bedtime. 30 tablet 5   Multiple Vitamin (MULTIVITAMIN WITH MINERALS) TABS tablet Take 1 tablet by mouth daily.     Omega-3 Fatty Acids (OMEGA 3 PO) Take 1 capsule by mouth daily.     omeprazole (PRILOSEC) 20 MG capsule Take 1 capsule (20 mg total) by mouth daily. 30 capsule 5   PRESCRIPTION MEDICATION once a week.     tiotropium (SPIRIVA HANDIHALER) 18 MCG inhalation  capsule Place 1 capsule (18 mcg total) into inhaler and inhale 1 day or 1 dose. 30 capsule 5   vitamin C (ASCORBIC ACID) 500 MG tablet Take 500 mg by mouth daily.     Current Facility-Administered Medications  Medication Dose Route Frequency Provider Last Rate Last Admin   omalizumab Arvid Right) injection 300 mg  300 mg Subcutaneous Q28 days Jiles Prows, MD   300 mg at 03/06/21 1341   Allergies: Allergies  Allergen Reactions   Advair Diskus [Fluticasone-Salmeterol]     Per patient, she thinks she developed walking PNA.    Avelox [Moxifloxacin Hcl In Nacl] Other (See Comments)    Muscle Aches   Irbesartan Other (See Comments)   Levofloxacin Other (See Comments)    Tendon pain   Olmesartan Other (See Comments)   Other Other (See Comments)   Protonix [Pantoprazole Sodium] Diarrhea   I reviewed her past medical history, social history, family history, and environmental history and no significant changes have been reported  from her previous visit.  Review of Systems  Constitutional:  Negative for appetite change, chills, fever and unexpected weight change.  HENT:  Negative for congestion and rhinorrhea.   Eyes:  Negative for itching.  Respiratory:  Negative for cough, chest tightness, shortness of breath and wheezing.   Gastrointestinal:  Negative for abdominal pain.  Skin:  Negative for rash.  Allergic/Immunologic: Positive for environmental allergies.  Neurological:  Negative for headaches.   Objective: BP (!) 148/80   Pulse (!) 103   Temp (!) 97.3 F (36.3 C) (Temporal)   Resp 18   Ht 5\' 3"  (1.6 m)   Wt 233 lb 12.8 oz (106.1 kg)   SpO2 96%   BMI 41.42 kg/m  Body mass index is 41.42 kg/m. Physical Exam Vitals and nursing note reviewed.  Constitutional:      Appearance: Normal appearance. She is well-developed.  HENT:     Head: Normocephalic and atraumatic.     Right Ear: Tympanic membrane and external ear normal.     Left Ear: Tympanic membrane and external ear  normal.     Nose: Nose normal. No congestion or rhinorrhea.     Mouth/Throat:     Mouth: Mucous membranes are moist.     Pharynx: Oropharynx is clear.  Eyes:     Conjunctiva/sclera: Conjunctivae normal.  Cardiovascular:     Rate and Rhythm: Normal rate and regular rhythm.     Heart sounds: Normal heart sounds. No murmur heard. Pulmonary:     Effort: Pulmonary effort is normal.     Breath sounds: Normal breath sounds. No wheezing, rhonchi or rales.  Musculoskeletal:     Cervical back: Neck supple.  Skin:    General: Skin is warm.     Findings: No rash.  Neurological:     Mental Status: She is alert and oriented to person, place, and time.  Psychiatric:        Behavior: Behavior normal.   Previous notes and tests were reviewed. The plan was reviewed with the patient/family, and all questions/concerned were addressed.  It was my pleasure to see Ayelen today and participate in her care. Please feel free to contact me with any questions or concerns.  Sincerely,  Rexene Alberts, DO Allergy & Immunology  [facility] office: [place] office: [phone]

## 2021-03-18 NOTE — Assessment & Plan Note (Signed)
Stable with below regimen. Hesitant about switching to Trelegy.  . Today's spirometry was normal.  . Daily controller medication(s): continue with Symbicort 123mcg 2 puffs twice a day with spacer and rinse mouth afterwards. o Continue Spiriva 1 capsule daily. o Continue montelukast 10mg  daily. o Continue Xolair 300mg  every 4 weeks. o If you want to try Trelegy then ask the nurses when you come in for your injections for a sample. No sample in the office today.  - Trelegy 262mcg 1 puff once a day and rinse mouth after each use.  . During upper respiratory infections/asthma flares: start Armonair Digihaler 166mcg 1 puff twice a day and rinse mouth after each use for 1-2 weeks until your breathing symptoms return to baseline.  . May use albuterol rescue inhaler 2 puffs every 4 to 6 hours as needed for shortness of breath, chest tightness, coughing, and wheezing. May use albuterol rescue inhaler 2 puffs 5 to 15 minutes prior to strenuous physical activities. Monitor frequency of use.  . Get spirometry at next visit.

## 2021-03-18 NOTE — Assessment & Plan Note (Signed)
Past history - started AIT on W-M before 2016. Interim history - doing well with weekly injections.  Continue environmental control measures (weed, molds)  Continue allergy injections weekly - given today.  Continue montelukast 10mg  daily and allegra 180mg  daily.   May use azelastine 1-2 sprays per nostril at night for drainage.  May use Flonase 1-2 sprays per nostril in the morning for nasal congestion.  Check with your eye doctor about using Flonase with your glaucoma.

## 2021-03-18 NOTE — Patient Instructions (Addendum)
Asthma: Daily controller medication(s): continue with Symbicort 172mcg 2 puffs twice a day with spacer and rinse mouth afterwards. Continue Spiriva daily. Continue montelukast 10mg  daily. Continue Xolair 300mg  every 4 weeks. If you want to try Trelegy then ask the nurses when you come in for your injections for a sample. Trelegy 228mcg 1 puff once a day and rinse mouth after each use.  During upper respiratory infections/asthma flares: start Armonair Digihaler 154mcg 1 puff twice a day and rinse mouth after each use for 1-2 weeks until your breathing symptoms return to baseline.  May use albuterol rescue inhaler 2 puffs every 4 to 6 hours as needed for shortness of breath, chest tightness, coughing, and wheezing. May use albuterol rescue inhaler 2 puffs 5 to 15 minutes prior to strenuous physical activities. Monitor frequency of use.  Asthma control goals:  Full participation in all desired activities (may need albuterol before activity) Albuterol use two times or less a week on average (not counting use with activity) Cough interfering with sleep two times or less a month Oral steroids no more than once a year No hospitalizations  Allergic rhinitis Continue environmental control measures (weed, molds) Continue allergy injections weekly - given today. Continue montelukast 10mg  daily and allegra 180mg  daily.  May use azelastine 1-2 sprays per nostril at night for drainage. May use Flonase 1-2 sprays per nostril in the morning for nasal congestion. Check with your eye doctor about using Flonase with your glaucoma.  Reflux Continue with omeprazole 20mg  in the morning.  Follow up in 4 months or sooner if needed.   Reducing Pollen Exposure Pollen seasons: trees (spring), grass (summer) and ragweed/weeds (fall). Keep windows closed in your home and car to lower pollen exposure.  Install air conditioning in the bedroom and throughout the house if possible.  Avoid going out in dry windy days  - especially early morning. Pollen counts are highest between 5 - 10 AM and on dry, hot and windy days.  Save outside activities for late afternoon or after a heavy rain, when pollen levels are lower.  Avoid mowing of grass if you have grass pollen allergy. Be aware that pollen can also be transported indoors on people and pets.  Dry your clothes in an automatic dryer rather than hanging them outside where they might collect pollen.  Rinse hair and eyes before bedtime. Mold Control Mold and fungi can grow on a variety of surfaces provided certain temperature and moisture conditions exist.  Outdoor molds grow on plants, decaying vegetation and soil. The major outdoor mold, Alternaria and Cladosporium, are found in very high numbers during hot and dry conditions. Generally, a late summer - fall peak is seen for common outdoor fungal spores. Rain will temporarily lower outdoor mold spore count, but counts rise rapidly when the rainy period ends. The most important indoor molds are Aspergillus and Penicillium. Dark, humid and poorly ventilated basements are ideal sites for mold growth. The next most common sites of mold growth are the bathroom and the kitchen. Outdoor (Seasonal) Mold Control Use air conditioning and keep windows closed. Avoid exposure to decaying vegetation. Avoid leaf raking. Avoid grain handling. Consider wearing a face mask if working in moldy areas.  Indoor (Perennial) Mold Control  Maintain humidity below 50%. Get rid of mold growth on hard surfaces with water, detergent and, if necessary, 5% bleach (do not mix with other cleaners). Then dry the area completely. If mold covers an area more than 10 square feet, consider hiring an indoor  environmental professional. For clothing, washing with soap and water is best. If moldy items cannot be cleaned and dried, throw them away. Remove sources e.g. contaminated carpets. Repair and seal leaking roofs or pipes. Using dehumidifiers in  damp basements may be helpful, but empty the water and clean units regularly to prevent mildew from forming. All rooms, especially basements, bathrooms and kitchens, require ventilation and cleaning to deter mold and mildew growth. Avoid carpeting on concrete or damp floors, and storing items in damp areas.

## 2021-03-26 ENCOUNTER — Ambulatory Visit (INDEPENDENT_AMBULATORY_CARE_PROVIDER_SITE_OTHER): Payer: Medicare Other | Admitting: *Deleted

## 2021-03-26 DIAGNOSIS — J309 Allergic rhinitis, unspecified: Secondary | ICD-10-CM

## 2021-04-01 ENCOUNTER — Ambulatory Visit (INDEPENDENT_AMBULATORY_CARE_PROVIDER_SITE_OTHER): Payer: Medicare Other

## 2021-04-01 DIAGNOSIS — J309 Allergic rhinitis, unspecified: Secondary | ICD-10-CM | POA: Diagnosis not present

## 2021-04-02 DIAGNOSIS — J4550 Severe persistent asthma, uncomplicated: Secondary | ICD-10-CM | POA: Diagnosis not present

## 2021-04-03 ENCOUNTER — Other Ambulatory Visit: Payer: Self-pay

## 2021-04-03 ENCOUNTER — Ambulatory Visit (INDEPENDENT_AMBULATORY_CARE_PROVIDER_SITE_OTHER): Payer: Medicare Other

## 2021-04-03 DIAGNOSIS — J4550 Severe persistent asthma, uncomplicated: Secondary | ICD-10-CM

## 2021-04-04 ENCOUNTER — Encounter: Payer: Self-pay | Admitting: Allergy

## 2021-04-08 ENCOUNTER — Other Ambulatory Visit: Payer: Self-pay | Admitting: Allergy

## 2021-04-08 MED ORDER — ARMONAIR DIGIHALER 113 MCG/ACT IN AEPB: 1.0000 | INHALATION_SPRAY | Freq: Two times a day (BID) | RESPIRATORY_TRACT | 3 refills | Status: DC

## 2021-04-09 ENCOUNTER — Telehealth: Payer: Self-pay

## 2021-04-09 ENCOUNTER — Ambulatory Visit (INDEPENDENT_AMBULATORY_CARE_PROVIDER_SITE_OTHER): Payer: Medicare Other | Admitting: *Deleted

## 2021-04-09 DIAGNOSIS — J309 Allergic rhinitis, unspecified: Secondary | ICD-10-CM

## 2021-04-09 NOTE — Telephone Encounter (Signed)
Armonair is not covered please advise to change

## 2021-04-09 NOTE — Telephone Encounter (Signed)
Flovent 110 mcg 2 puffs bid to use during asthma flares was sent in earlier today. Patient aware.

## 2021-04-09 NOTE — Telephone Encounter (Signed)
Unable to leave message. No answer. Patient pharmacy sent over a refill request for flovent and patient is taking symbicort 160 mcg. When patient saw Dr. [name] she said the patient could have a trelegy 200 mcg sample. It would  be   50.00 dollars cheaper, but patient is hesitant bc the symbicort is working well for her.

## 2021-04-09 NOTE — Telephone Encounter (Signed)
Spoke to patient the 1st time regarding the armonair 113 mcg was given to patient as a sample. Patient stated it worked well. Let patient know that her insurance does not cover the armonair 113 mcg and flovent 110 mcg was sent in. Patient is aware.

## 2021-04-09 NOTE — Telephone Encounter (Signed)
Sent in flovent 140mcg 2 puffs BID to use during asthma flares.

## 2021-04-16 ENCOUNTER — Ambulatory Visit (INDEPENDENT_AMBULATORY_CARE_PROVIDER_SITE_OTHER): Payer: Medicare Other | Admitting: *Deleted

## 2021-04-16 DIAGNOSIS — J309 Allergic rhinitis, unspecified: Secondary | ICD-10-CM | POA: Diagnosis not present

## 2021-04-22 DIAGNOSIS — J3089 Other allergic rhinitis: Secondary | ICD-10-CM | POA: Diagnosis not present

## 2021-04-22 NOTE — Progress Notes (Addendum)
VIAL MADE. EXP 04-22-22. HAD TO REPRINT LABEL.

## 2021-04-23 ENCOUNTER — Ambulatory Visit (INDEPENDENT_AMBULATORY_CARE_PROVIDER_SITE_OTHER): Payer: Medicare Other | Admitting: *Deleted

## 2021-04-23 DIAGNOSIS — J309 Allergic rhinitis, unspecified: Secondary | ICD-10-CM

## 2021-04-29 ENCOUNTER — Ambulatory Visit (INDEPENDENT_AMBULATORY_CARE_PROVIDER_SITE_OTHER): Payer: Medicare Other

## 2021-04-29 DIAGNOSIS — J309 Allergic rhinitis, unspecified: Secondary | ICD-10-CM

## 2021-04-30 DIAGNOSIS — J4550 Severe persistent asthma, uncomplicated: Secondary | ICD-10-CM | POA: Diagnosis not present

## 2021-05-01 ENCOUNTER — Ambulatory Visit: Payer: Medicare Other

## 2021-05-01 ENCOUNTER — Other Ambulatory Visit: Payer: Self-pay

## 2021-05-01 ENCOUNTER — Ambulatory Visit (INDEPENDENT_AMBULATORY_CARE_PROVIDER_SITE_OTHER): Payer: Medicare Other

## 2021-05-01 DIAGNOSIS — J4550 Severe persistent asthma, uncomplicated: Secondary | ICD-10-CM

## 2021-05-06 ENCOUNTER — Ambulatory Visit (INDEPENDENT_AMBULATORY_CARE_PROVIDER_SITE_OTHER): Payer: Medicare Other

## 2021-05-06 DIAGNOSIS — J309 Allergic rhinitis, unspecified: Secondary | ICD-10-CM | POA: Diagnosis not present

## 2021-05-13 ENCOUNTER — Ambulatory Visit (INDEPENDENT_AMBULATORY_CARE_PROVIDER_SITE_OTHER): Payer: Medicare Other

## 2021-05-13 DIAGNOSIS — J309 Allergic rhinitis, unspecified: Secondary | ICD-10-CM | POA: Diagnosis not present

## 2021-05-21 ENCOUNTER — Ambulatory Visit (INDEPENDENT_AMBULATORY_CARE_PROVIDER_SITE_OTHER): Payer: Medicare Other | Admitting: *Deleted

## 2021-05-21 DIAGNOSIS — J309 Allergic rhinitis, unspecified: Secondary | ICD-10-CM | POA: Diagnosis not present

## 2021-05-27 ENCOUNTER — Ambulatory Visit (INDEPENDENT_AMBULATORY_CARE_PROVIDER_SITE_OTHER): Payer: Medicare Other

## 2021-05-27 DIAGNOSIS — J309 Allergic rhinitis, unspecified: Secondary | ICD-10-CM | POA: Diagnosis not present

## 2021-05-28 DIAGNOSIS — J4550 Severe persistent asthma, uncomplicated: Secondary | ICD-10-CM | POA: Diagnosis not present

## 2021-05-29 ENCOUNTER — Other Ambulatory Visit: Payer: Self-pay

## 2021-05-29 ENCOUNTER — Ambulatory Visit (INDEPENDENT_AMBULATORY_CARE_PROVIDER_SITE_OTHER): Payer: Medicare Other

## 2021-05-29 ENCOUNTER — Ambulatory Visit: Payer: Self-pay

## 2021-05-29 DIAGNOSIS — J4550 Severe persistent asthma, uncomplicated: Secondary | ICD-10-CM

## 2021-06-04 ENCOUNTER — Ambulatory Visit (INDEPENDENT_AMBULATORY_CARE_PROVIDER_SITE_OTHER): Payer: Medicare Other

## 2021-06-04 DIAGNOSIS — J309 Allergic rhinitis, unspecified: Secondary | ICD-10-CM

## 2021-06-11 ENCOUNTER — Ambulatory Visit (INDEPENDENT_AMBULATORY_CARE_PROVIDER_SITE_OTHER): Payer: Medicare Other

## 2021-06-11 DIAGNOSIS — J309 Allergic rhinitis, unspecified: Secondary | ICD-10-CM | POA: Diagnosis not present

## 2021-06-19 ENCOUNTER — Ambulatory Visit (INDEPENDENT_AMBULATORY_CARE_PROVIDER_SITE_OTHER): Payer: Medicare Other

## 2021-06-19 DIAGNOSIS — J309 Allergic rhinitis, unspecified: Secondary | ICD-10-CM | POA: Diagnosis not present

## 2021-06-24 ENCOUNTER — Ambulatory Visit (INDEPENDENT_AMBULATORY_CARE_PROVIDER_SITE_OTHER): Payer: Medicare Other

## 2021-06-24 DIAGNOSIS — J309 Allergic rhinitis, unspecified: Secondary | ICD-10-CM

## 2021-06-25 DIAGNOSIS — J4550 Severe persistent asthma, uncomplicated: Secondary | ICD-10-CM | POA: Diagnosis not present

## 2021-06-26 ENCOUNTER — Other Ambulatory Visit: Payer: Self-pay

## 2021-06-26 ENCOUNTER — Ambulatory Visit (INDEPENDENT_AMBULATORY_CARE_PROVIDER_SITE_OTHER): Payer: Medicare Other

## 2021-06-26 DIAGNOSIS — J4550 Severe persistent asthma, uncomplicated: Secondary | ICD-10-CM

## 2021-07-02 ENCOUNTER — Ambulatory Visit (INDEPENDENT_AMBULATORY_CARE_PROVIDER_SITE_OTHER): Payer: Medicare Other

## 2021-07-02 DIAGNOSIS — J309 Allergic rhinitis, unspecified: Secondary | ICD-10-CM | POA: Diagnosis not present

## 2021-07-08 DIAGNOSIS — J302 Other seasonal allergic rhinitis: Secondary | ICD-10-CM | POA: Diagnosis not present

## 2021-07-09 ENCOUNTER — Ambulatory Visit (INDEPENDENT_AMBULATORY_CARE_PROVIDER_SITE_OTHER): Payer: Medicare Other

## 2021-07-09 DIAGNOSIS — J309 Allergic rhinitis, unspecified: Secondary | ICD-10-CM | POA: Diagnosis not present

## 2021-07-10 NOTE — Progress Notes (Signed)
VIAL EXP 07-11-22 ?

## 2021-07-16 ENCOUNTER — Ambulatory Visit (INDEPENDENT_AMBULATORY_CARE_PROVIDER_SITE_OTHER): Payer: Medicare Other

## 2021-07-16 DIAGNOSIS — J309 Allergic rhinitis, unspecified: Secondary | ICD-10-CM | POA: Diagnosis not present

## 2021-07-17 ENCOUNTER — Ambulatory Visit: Payer: Medicare Other | Admitting: Allergy

## 2021-07-22 ENCOUNTER — Ambulatory Visit (INDEPENDENT_AMBULATORY_CARE_PROVIDER_SITE_OTHER): Payer: Medicare Other

## 2021-07-22 DIAGNOSIS — J309 Allergic rhinitis, unspecified: Secondary | ICD-10-CM | POA: Diagnosis not present

## 2021-07-23 DIAGNOSIS — J4540 Moderate persistent asthma, uncomplicated: Secondary | ICD-10-CM

## 2021-07-24 ENCOUNTER — Ambulatory Visit (INDEPENDENT_AMBULATORY_CARE_PROVIDER_SITE_OTHER): Payer: Medicare Other | Admitting: *Deleted

## 2021-07-24 DIAGNOSIS — J4540 Moderate persistent asthma, uncomplicated: Secondary | ICD-10-CM

## 2021-07-30 ENCOUNTER — Ambulatory Visit (INDEPENDENT_AMBULATORY_CARE_PROVIDER_SITE_OTHER): Payer: Medicare Other

## 2021-07-30 DIAGNOSIS — J309 Allergic rhinitis, unspecified: Secondary | ICD-10-CM | POA: Diagnosis not present

## 2021-08-06 ENCOUNTER — Ambulatory Visit (INDEPENDENT_AMBULATORY_CARE_PROVIDER_SITE_OTHER): Payer: Medicare Other

## 2021-08-06 DIAGNOSIS — J309 Allergic rhinitis, unspecified: Secondary | ICD-10-CM | POA: Diagnosis not present

## 2021-08-07 ENCOUNTER — Ambulatory Visit: Payer: Medicare Other | Admitting: Allergy

## 2021-08-13 ENCOUNTER — Ambulatory Visit (INDEPENDENT_AMBULATORY_CARE_PROVIDER_SITE_OTHER): Payer: Medicare Other

## 2021-08-13 DIAGNOSIS — J309 Allergic rhinitis, unspecified: Secondary | ICD-10-CM | POA: Diagnosis not present

## 2021-08-14 ENCOUNTER — Ambulatory Visit: Payer: Medicare Other | Admitting: Allergy

## 2021-08-19 ENCOUNTER — Ambulatory Visit (INDEPENDENT_AMBULATORY_CARE_PROVIDER_SITE_OTHER): Payer: Medicare Other

## 2021-08-19 ENCOUNTER — Telehealth: Payer: Self-pay | Admitting: Allergy

## 2021-08-19 DIAGNOSIS — J309 Allergic rhinitis, unspecified: Secondary | ICD-10-CM

## 2021-08-19 NOTE — Telephone Encounter (Signed)
Please call patient. ? ?I received a form to fill out regarding her asthma and pre-op risk. ?I have not seen her since December 2022. She needs to make an OV to see how her asthma is doing before I can fill out this form. ? ?Thank you.  ?

## 2021-08-19 NOTE — Telephone Encounter (Signed)
Ignore last message. ? ?I see that she has OV with me on May 10th. I will fill out her paperwork after that visit. ? ?Thank you.  ?

## 2021-08-20 DIAGNOSIS — J4550 Severe persistent asthma, uncomplicated: Secondary | ICD-10-CM | POA: Diagnosis not present

## 2021-08-20 NOTE — Progress Notes (Signed)
? ?Follow Up Note ? ?RE: Melanie Armstrong MRN: [id number] DOB: 1955/08/08 ?Date of Office Visit: 08/21/2021 ? ?Referring provider: [name], MD ?Primary care provider: [name], MD ? ?Chief Complaint: Follow-up ? ?History of Present Illness: ?I had the pleasure of seeing Melanie Armstrong for a follow up visit at the [facility] on 08/21/2021. She is a 66 y.o. female, who is being followed for asthma on Xolair 300 mg every 4 weeks, allergic rhinitis on AIT and GERD. Her previous allergy office visit was on 03/18/2021 with Dr. [name]. Today is a regular follow up visit. ? ?Patient is getting left knee replaced on July 3rd.  ?She did get both hips replaced in the past with no issues.  ?No issues with anesthesia in the past.  ? ?Patient had glaucoma and cataract surgery in 2023.  ? ?Severe persistent asthma ?ACT score is 21 ? ?Currently on Symbicort 128mg 2 puffs twice a day, Spiriva daily, Singulair '10mg'$  daily and Xolair '300mg'$  every 4 weeks. ? ?Using albuterol about once every few weeks with good benefit.  ? ?Denies any ER/urgent care visits or prednisone use since the last visit. ?  ?Allergic rhinitis ?Receiving allergy injections weekly with good benefit - symptoms returned when not on weekly injections. No localized reactions. ?Using Flonase 1 spray per nostril once a day.  ?Uses astepro as needed. ?Taking Singulair and allegra daily. ? ?Gastroesophageal reflux disease ?Stable with omeprazole '20mg'$  in the morning. ?  ?Assessment and Plan: ?SBrendanis a 66y.o. female with: ?Severe persistent asthma, uncomplicated ?Well-controlled with below regimen. Left knee replacement scheduled for July 3rd - needs form to be filled out to risk stratify asthma. No issues with prior joints replacements/anesthesia.  ?Today's spirometry was normal.  ?ACT score 21.  ?I will fax the form to your surgeon - low risk from asthma perspective as asthma is well controlled. ?Pretreat with albuterol prior to surgical  procedure.  ?Daily controller medication(s): continue with Symbicort 1659m 2 puffs twice a day with spacer and rinse mouth afterwards. ?Continue Spiriva daily. ?Continue montelukast '10mg'$  daily. ?Continue Xolair '300mg'$  every 4 weeks - given today.  ?Will try to get your Xolair injection before your surgery on June 30th.  ?Will consider switching to Trelegy after surgery.  ?During upper respiratory infections/asthma flares: start Armonair Digihaler 11358m1 puff twice a day and rinse mouth after each use for 1-2 weeks until your breathing symptoms return to baseline.  ?May use albuterol rescue inhaler 2 puffs every 4 to 6 hours as needed for shortness of breath, chest tightness, coughing, and wheezing. May use albuterol rescue inhaler 2 puffs 5 to 15 minutes prior to strenuous physical activities. Monitor frequency of use.  ?Get spirometry at next visit. ? ?Allergic rhinitis ?Past history - started AIT on W-M before 2016. ?Interim history - doing well with weekly injections, only using Flonase 1 spray per nostril due to glaucoma history.  ?Continue environmental control measures (weed, molds) ?Continue allergy injections weekly. ?Continue montelukast '10mg'$  daily and allegra '180mg'$  daily.  ?May use azelastine 1-2 sprays per nostril at night for drainage. ?May use Flonase 1-2 sprays per nostril in the morning for nasal congestion. ? ?Gastroesophageal reflux disease ?Controlled. ?Continue with omeprazole '20mg'$  in the morning. ? ?Return in about 4 months (around 12/22/2021). ? ?No orders of the defined types were placed in this encounter. ? ?Lab Orders  ?No laboratory test(s) ordered today  ? ? ?Diagnostics: ?Spirometry:  ?Tracings reviewed. Her effort: Good reproducible  efforts. ?FVC: 2.68L ?FEV1: 2.30L, 100% predicted ?FEV1/FVC ratio: 86% ?Interpretation: Spirometry consistent with normal pattern.  ?Please see scanned spirometry results for details. ? ? ?Medication List:  ?Current Outpatient Medications  ?Medication Sig  Dispense Refill  ? albuterol (PROAIR HFA) 108 (90 Base) MCG/ACT inhaler Inhale 2 puffs into the lungs every 4 (four) hours as needed. 18 g 1  ? albuterol (PROVENTIL) (2.5 MG/3ML) 0.083% nebulizer solution USE 1 VIAL IN NEBULIZER EVERY 4 HOURS AS NEEDED FOR WHEEZING OR  SHORTNESS  OF  BREATH 600 mL 0  ? azelastine (ASTELIN) 0.1 % nasal spray Place into both nostrils.    ? benzonatate (TESSALON) 200 MG capsule benzonatate 200 mg capsule ? TAKE ONE CAPSULE THREE TIMES DAILY    ? budesonide-formoterol (SYMBICORT) 160-4.5 MCG/ACT inhaler Inhale 2 puffs into the lungs 2 (two) times daily. 1 Inhaler 5  ? calcium-vitamin D (OSCAL WITH D) 500-200 MG-UNIT per tablet Take 1 tablet by mouth daily with breakfast.    ? diazepam (VALIUM) 5 MG tablet Take 5 mg by mouth every 6 (six) hours as needed for anxiety.    ? EPINEPHrine (EPIPEN 2-PAK) 0.3 mg/0.3 mL IJ SOAJ injection Inject 0.3 mg into the muscle as needed for anaphylaxis. USE AS DIRECTED FOR SEVERE ALLERGIC REACTION 1 each 1  ? Ferrous Sulfate (IRON) 325 (65 Fe) MG TABS     ? fexofenadine (ALLEGRA) 180 MG tablet Take 180 mg by mouth daily.    ? fluticasone (FLONASE) 50 MCG/ACT nasal spray PLACE 1-2 SPRAYS INTO BOTH NOSTRILS DAILY AS NEEDED FOR ALLERGIES OR RHINITIS. 16 mL 2  ? fluticasone (FLOVENT HFA) 110 MCG/ACT inhaler Inhale 2 puffs into the lungs in the morning and at bedtime. with spacer and rinse mouth afterwards. Use during asthma flares for 1-2 weeks in addition to your other inhalers. 1 each 3  ? furosemide (LASIX) 20 MG tablet Take 20 mg by mouth every morning.    ? glucosamine-chondroitin 500-400 MG tablet Take 1 tablet by mouth 2 (two) times daily.    ? lisinopril (ZESTRIL) 20 MG tablet Take 40 mg by mouth daily.    ? LUMIGAN 0.01 % SOLN SMARTSIG:In Eye(s)    ? montelukast (SINGULAIR) 10 MG tablet Take 1 tablet (10 mg total) by mouth at bedtime. 30 tablet 5  ? Multiple Vitamin (MULTIVITAMIN WITH MINERALS) TABS tablet Take 1 tablet by mouth daily.    ? Omega-3  Fatty Acids (OMEGA 3 PO) Take 1 capsule by mouth daily.    ? omeprazole (PRILOSEC) 20 MG capsule Take 1 capsule (20 mg total) by mouth daily. 30 capsule 5  ? PRESCRIPTION MEDICATION once a week.    ? tiotropium (SPIRIVA HANDIHALER) 18 MCG inhalation capsule Place 1 capsule (18 mcg total) into inhaler and inhale 1 day or 1 dose. 30 capsule 5  ? vitamin C (ASCORBIC ACID) 500 MG tablet Take 500 mg by mouth daily.    ? ?Current Facility-Administered Medications  ?Medication Dose Route Frequency Provider Last Rate Last Admin  ? omalizumab Arvid Right) injection 300 mg  300 mg Subcutaneous Q28 days Jiles Prows, MD   300 mg at 08/21/21 1126  ? ?Allergies: ?Allergies  ?Allergen Reactions  ? Advair Diskus [Fluticasone-Salmeterol]   ?  Per patient, she thinks she developed walking PNA.   ? Avelox [Moxifloxacin Hcl In Nacl] Other (See Comments)  ?  Muscle Aches  ? Irbesartan Other (See Comments)  ? Levofloxacin Other (See Comments)  ?  Tendon pain  ? Olmesartan Other (  See Comments)  ? Other Other (See Comments)  ? Protonix [Pantoprazole Sodium] Diarrhea  ? ?I reviewed her past medical history, social history, family history, and environmental history and no significant changes have been reported from her previous visit. ? ?Review of Systems  ?Constitutional:  Negative for appetite change, chills, fever and unexpected weight change.  ?HENT:  Positive for congestion. Negative for rhinorrhea.   ?Eyes:  Negative for itching.  ?Respiratory:  Negative for cough, chest tightness, shortness of breath and wheezing.   ?Gastrointestinal:  Negative for abdominal pain.  ?Skin:  Negative for rash.  ?Allergic/Immunologic: Positive for environmental allergies.  ?Neurological:  Negative for headaches.  ? ?Objective: ?BP 140/72   Pulse 74   Temp 97.8 ?F (36.6 ?C)   Resp 16   Ht '5\' 3"'$  (1.6 m)   Wt 214 lb (97.1 kg)   SpO2 98%   BMI 37.91 kg/m?  ?Body mass index is 37.91 kg/m?Marland Kitchen ?Physical Exam ?Vitals and nursing note reviewed.   ?Constitutional:   ?   Appearance: Normal appearance. She is well-developed.  ?HENT:  ?   Head: Normocephalic and atraumatic.  ?   Right Ear: Tympanic membrane and external ear normal.  ?   Left Ear: Tympanic membrane and extern

## 2021-08-21 ENCOUNTER — Ambulatory Visit (INDEPENDENT_AMBULATORY_CARE_PROVIDER_SITE_OTHER): Payer: Medicare Other

## 2021-08-21 ENCOUNTER — Ambulatory Visit: Payer: Medicare Other | Admitting: Allergy

## 2021-08-21 ENCOUNTER — Encounter: Payer: Self-pay | Admitting: Allergy

## 2021-08-21 VITALS — BP 140/72 | HR 74 | Temp 97.8°F | Resp 16 | Ht 63.0 in | Wt 214.0 lb

## 2021-08-21 DIAGNOSIS — K219 Gastro-esophageal reflux disease without esophagitis: Secondary | ICD-10-CM

## 2021-08-21 DIAGNOSIS — J3089 Other allergic rhinitis: Secondary | ICD-10-CM

## 2021-08-21 DIAGNOSIS — J4550 Severe persistent asthma, uncomplicated: Secondary | ICD-10-CM

## 2021-08-21 DIAGNOSIS — J4540 Moderate persistent asthma, uncomplicated: Secondary | ICD-10-CM

## 2021-08-21 NOTE — Patient Instructions (Addendum)
Asthma: ?I will fax the form to your surgeon - low risk as asthma is well controlled. ?Pretreat with albuterol prior to surgical procedure.  ?Daily controller medication(s): continue with Symbicort 179mg 2 puffs twice a day with spacer and rinse mouth afterwards. ?Continue Spiriva daily. ?Continue montelukast '10mg'$  daily. ?Continue Xolair '300mg'$  every 4 weeks - given today.  ?Will try to get your Xolair injection before your surgery on June 30th.  ?Will consider switching to Trelegy after your surgery.  ?During upper respiratory infections/asthma flares: start Armonair Digihaler 1123m 1 puff twice a day and rinse mouth after each use for 1-2 weeks until your breathing symptoms return to baseline.  ?May use albuterol rescue inhaler 2 puffs every 4 to 6 hours as needed for shortness of breath, chest tightness, coughing, and wheezing. May use albuterol rescue inhaler 2 puffs 5 to 15 minutes prior to strenuous physical activities. Monitor frequency of use.  ?Asthma control goals:  ?Full participation in all desired activities (may need albuterol before activity) ?Albuterol use two times or less a week on average (not counting use with activity) ?Cough interfering with sleep two times or less a month ?Oral steroids no more than once a year ?No hospitalizations ? ?Allergic rhinitis ?Continue environmental control measures (weed, molds) ?Continue allergy injections weekly. ?Continue montelukast '10mg'$  daily and allegra '180mg'$  daily.  ?May use azelastine 1-2 sprays per nostril at night for drainage. ?May use Flonase 1-2 sprays per nostril in the morning for nasal congestion. ? ?Reflux ?Continue with omeprazole '20mg'$  in the morning. ? ?Follow up in 4 months or sooner if needed.  ? ?Reducing Pollen Exposure ?Pollen seasons: trees (spring), grass (summer) and ragweed/weeds (fall). ?Keep windows closed in your home and car to lower pollen exposure.  ?Install air conditioning in the bedroom and throughout the house if possible.   ?Avoid going out in dry windy days - especially early morning. ?Pollen counts are highest between 5 - 10 AM and on dry, hot and windy days.  ?Save outside activities for late afternoon or after a heavy rain, when pollen levels are lower.  ?Avoid mowing of grass if you have grass pollen allergy. ?Be aware that pollen can also be transported indoors on people and pets.  ?Dry your clothes in an automatic dryer rather than hanging them outside where they might collect pollen.  ?Rinse hair and eyes before bedtime. ?Mold Control ?Mold and fungi can grow on a variety of surfaces provided certain temperature and moisture conditions exist.  ?Outdoor molds grow on plants, decaying vegetation and soil. The major outdoor mold, Alternaria and Cladosporium, are found in very high numbers during hot and dry conditions. Generally, a late summer - fall peak is seen for common outdoor fungal spores. Rain will temporarily lower outdoor mold spore count, but counts rise rapidly when the rainy period ends. ?The most important indoor molds are Aspergillus and Penicillium. Dark, humid and poorly ventilated basements are ideal sites for mold growth. The next most common sites of mold growth are the bathroom and the kitchen. ?Outdoor (Seasonal) Mold Control ?Use air conditioning and keep windows closed. ?Avoid exposure to decaying vegetation. ?Avoid leaf raking. ?Avoid grain handling. ?Consider wearing a face mask if working in moldy areas.  ?Indoor (Perennial) Mold Control  ?Maintain humidity below 50%. ?Get rid of mold growth on hard surfaces with water, detergent and, if necessary, 5% bleach (do not mix with other cleaners). Then dry the area completely. If mold covers an area more than 10 square feet, consider  hiring an Patent examiner. ?For clothing, washing with soap and water is best. If moldy items cannot be cleaned and dried, throw them away. ?Remove sources e.g. contaminated carpets. ?Repair and seal leaking  roofs or pipes. Using dehumidifiers in damp basements may be helpful, but empty the water and clean units regularly to prevent mildew from forming. All rooms, especially basements, bathrooms and kitchens, require ventilation and cleaning to deter mold and mildew growth. Avoid carpeting on concrete or damp floors, and storing items in damp areas. ?

## 2021-08-21 NOTE — Assessment & Plan Note (Signed)
Past history - started AIT on W-M before 2016. ?Interim history - doing well with weekly injections, only using Flonase 1 spray per nostril due to glaucoma history.  ?? Continue environmental control measures (weed, molds) ?? Continue allergy injections weekly. ?? Continue montelukast '10mg'$  daily and allegra '180mg'$  daily.  ?? May use azelastine 1-2 sprays per nostril at night for drainage. ?? May use Flonase 1-2 sprays per nostril in the morning for nasal congestion. ?

## 2021-08-21 NOTE — Assessment & Plan Note (Signed)
Controlled.  Continue with omeprazole 20mg in the morning. 

## 2021-08-21 NOTE — Assessment & Plan Note (Signed)
Well-controlled with below regimen. Left knee replacement scheduled for July 3rd - needs form to be filled out to risk stratify asthma. No issues with prior joints replacements/anesthesia.  ?? Today's spirometry was normal.  ?? ACT score 21.  ?? I will fax the form to your surgeon - low risk from asthma perspective as asthma is well controlled. ?o Pretreat with albuterol prior to surgical procedure.  ?? Daily controller medication(s): continue with Symbicort 149mg 2 puffs twice a day with spacer and rinse mouth afterwards. ?o Continue Spiriva daily. ?o Continue montelukast '10mg'$  daily. ?o Continue Xolair '300mg'$  every 4 weeks - given today.  ?- Will try to get your Xolair injection before your surgery on June 30th.  ?o Will consider switching to Trelegy after surgery.  ?? During upper respiratory infections/asthma flares: start Armonair Digihaler 1167m 1 puff twice a day and rinse mouth after each use for 1-2 weeks until your breathing symptoms return to baseline.  ?? May use albuterol rescue inhaler 2 puffs every 4 to 6 hours as needed for shortness of breath, chest tightness, coughing, and wheezing. May use albuterol rescue inhaler 2 puffs 5 to 15 minutes prior to strenuous physical activities. Monitor frequency of use.  ?? Get spirometry at next visit. ?

## 2021-08-29 ENCOUNTER — Ambulatory Visit (INDEPENDENT_AMBULATORY_CARE_PROVIDER_SITE_OTHER): Payer: Medicare Other

## 2021-08-29 DIAGNOSIS — J309 Allergic rhinitis, unspecified: Secondary | ICD-10-CM | POA: Diagnosis not present

## 2021-09-04 ENCOUNTER — Ambulatory Visit (INDEPENDENT_AMBULATORY_CARE_PROVIDER_SITE_OTHER): Payer: Medicare Other

## 2021-09-04 DIAGNOSIS — J309 Allergic rhinitis, unspecified: Secondary | ICD-10-CM | POA: Diagnosis not present

## 2021-09-11 ENCOUNTER — Ambulatory Visit (INDEPENDENT_AMBULATORY_CARE_PROVIDER_SITE_OTHER): Payer: Medicare Other

## 2021-09-11 DIAGNOSIS — J309 Allergic rhinitis, unspecified: Secondary | ICD-10-CM

## 2021-09-16 ENCOUNTER — Ambulatory Visit (INDEPENDENT_AMBULATORY_CARE_PROVIDER_SITE_OTHER): Payer: Medicare Other

## 2021-09-16 DIAGNOSIS — J309 Allergic rhinitis, unspecified: Secondary | ICD-10-CM | POA: Diagnosis not present

## 2021-09-17 DIAGNOSIS — J4540 Moderate persistent asthma, uncomplicated: Secondary | ICD-10-CM | POA: Diagnosis not present

## 2021-09-17 NOTE — Progress Notes (Signed)
VIAL EXP 09-18-22

## 2021-09-18 ENCOUNTER — Ambulatory Visit (INDEPENDENT_AMBULATORY_CARE_PROVIDER_SITE_OTHER): Payer: Medicare Other

## 2021-09-18 DIAGNOSIS — J4540 Moderate persistent asthma, uncomplicated: Secondary | ICD-10-CM | POA: Diagnosis not present

## 2021-09-18 DIAGNOSIS — J302 Other seasonal allergic rhinitis: Secondary | ICD-10-CM | POA: Diagnosis not present

## 2021-09-24 ENCOUNTER — Ambulatory Visit (INDEPENDENT_AMBULATORY_CARE_PROVIDER_SITE_OTHER): Payer: Medicare Other

## 2021-09-24 DIAGNOSIS — J309 Allergic rhinitis, unspecified: Secondary | ICD-10-CM | POA: Diagnosis not present

## 2021-10-01 ENCOUNTER — Ambulatory Visit (INDEPENDENT_AMBULATORY_CARE_PROVIDER_SITE_OTHER): Payer: Medicare Other

## 2021-10-01 DIAGNOSIS — J309 Allergic rhinitis, unspecified: Secondary | ICD-10-CM | POA: Diagnosis not present

## 2021-10-08 ENCOUNTER — Ambulatory Visit (INDEPENDENT_AMBULATORY_CARE_PROVIDER_SITE_OTHER): Payer: Medicare Other

## 2021-10-08 DIAGNOSIS — J309 Allergic rhinitis, unspecified: Secondary | ICD-10-CM

## 2021-10-10 DIAGNOSIS — J4540 Moderate persistent asthma, uncomplicated: Secondary | ICD-10-CM | POA: Diagnosis not present

## 2021-10-11 ENCOUNTER — Ambulatory Visit (INDEPENDENT_AMBULATORY_CARE_PROVIDER_SITE_OTHER): Payer: Medicare Other

## 2021-10-11 ENCOUNTER — Ambulatory Visit: Payer: Medicare Other

## 2021-10-11 DIAGNOSIS — J4540 Moderate persistent asthma, uncomplicated: Secondary | ICD-10-CM | POA: Diagnosis not present

## 2021-10-21 ENCOUNTER — Ambulatory Visit (INDEPENDENT_AMBULATORY_CARE_PROVIDER_SITE_OTHER): Payer: Medicare Other | Admitting: *Deleted

## 2021-10-21 DIAGNOSIS — J309 Allergic rhinitis, unspecified: Secondary | ICD-10-CM | POA: Diagnosis not present

## 2021-10-28 ENCOUNTER — Ambulatory Visit (INDEPENDENT_AMBULATORY_CARE_PROVIDER_SITE_OTHER): Payer: Medicare Other

## 2021-10-28 DIAGNOSIS — J309 Allergic rhinitis, unspecified: Secondary | ICD-10-CM

## 2021-11-06 ENCOUNTER — Ambulatory Visit (INDEPENDENT_AMBULATORY_CARE_PROVIDER_SITE_OTHER): Payer: Medicare Other

## 2021-11-06 DIAGNOSIS — J4540 Moderate persistent asthma, uncomplicated: Secondary | ICD-10-CM | POA: Diagnosis not present

## 2021-11-12 ENCOUNTER — Ambulatory Visit (INDEPENDENT_AMBULATORY_CARE_PROVIDER_SITE_OTHER): Payer: Medicare Other

## 2021-11-12 DIAGNOSIS — J309 Allergic rhinitis, unspecified: Secondary | ICD-10-CM

## 2021-11-19 ENCOUNTER — Ambulatory Visit (INDEPENDENT_AMBULATORY_CARE_PROVIDER_SITE_OTHER): Payer: Medicare Other | Admitting: *Deleted

## 2021-11-19 DIAGNOSIS — J309 Allergic rhinitis, unspecified: Secondary | ICD-10-CM

## 2021-11-25 ENCOUNTER — Ambulatory Visit (INDEPENDENT_AMBULATORY_CARE_PROVIDER_SITE_OTHER): Payer: Medicare Other

## 2021-11-25 DIAGNOSIS — J309 Allergic rhinitis, unspecified: Secondary | ICD-10-CM | POA: Diagnosis not present

## 2021-12-02 ENCOUNTER — Ambulatory Visit (INDEPENDENT_AMBULATORY_CARE_PROVIDER_SITE_OTHER): Payer: Medicare Other | Admitting: *Deleted

## 2021-12-02 DIAGNOSIS — J309 Allergic rhinitis, unspecified: Secondary | ICD-10-CM | POA: Diagnosis not present

## 2021-12-04 ENCOUNTER — Ambulatory Visit: Payer: Medicare Other

## 2021-12-05 ENCOUNTER — Ambulatory Visit (INDEPENDENT_AMBULATORY_CARE_PROVIDER_SITE_OTHER): Payer: Medicare Other

## 2021-12-05 DIAGNOSIS — J4540 Moderate persistent asthma, uncomplicated: Secondary | ICD-10-CM | POA: Diagnosis not present

## 2021-12-09 ENCOUNTER — Ambulatory Visit (INDEPENDENT_AMBULATORY_CARE_PROVIDER_SITE_OTHER): Payer: Medicare Other

## 2021-12-09 DIAGNOSIS — J309 Allergic rhinitis, unspecified: Secondary | ICD-10-CM | POA: Diagnosis not present

## 2021-12-18 ENCOUNTER — Ambulatory Visit
Admission: RE | Admit: 2021-12-18 | Discharge: 2021-12-18 | Disposition: A | Discharge status: Home / Self Care | Payer: Medicare Other | Source: Ambulatory Visit | Attending: Internal Medicine | Admitting: Internal Medicine

## 2021-12-18 DIAGNOSIS — R911 Solitary pulmonary nodule: Secondary | ICD-10-CM

## 2021-12-23 ENCOUNTER — Ambulatory Visit (INDEPENDENT_AMBULATORY_CARE_PROVIDER_SITE_OTHER): Payer: Medicare Other

## 2021-12-23 DIAGNOSIS — J309 Allergic rhinitis, unspecified: Secondary | ICD-10-CM | POA: Diagnosis not present

## 2021-12-25 ENCOUNTER — Ambulatory Visit: Payer: Medicare Other | Admitting: Allergy

## 2021-12-27 ENCOUNTER — Encounter: Payer: Self-pay | Admitting: *Deleted

## 2021-12-30 ENCOUNTER — Ambulatory Visit (INDEPENDENT_AMBULATORY_CARE_PROVIDER_SITE_OTHER): Payer: Medicare Other | Admitting: *Deleted

## 2021-12-30 DIAGNOSIS — J309 Allergic rhinitis, unspecified: Secondary | ICD-10-CM | POA: Diagnosis not present

## 2021-12-31 DIAGNOSIS — J4550 Severe persistent asthma, uncomplicated: Secondary | ICD-10-CM

## 2021-12-31 DIAGNOSIS — J302 Other seasonal allergic rhinitis: Secondary | ICD-10-CM | POA: Diagnosis not present

## 2021-12-31 NOTE — Progress Notes (Signed)
VIAL EXP 01-01-23

## 2022-01-01 ENCOUNTER — Ambulatory Visit (INDEPENDENT_AMBULATORY_CARE_PROVIDER_SITE_OTHER): Payer: Medicare Other | Admitting: *Deleted

## 2022-01-01 DIAGNOSIS — J4550 Severe persistent asthma, uncomplicated: Secondary | ICD-10-CM

## 2022-01-07 ENCOUNTER — Ambulatory Visit (INDEPENDENT_AMBULATORY_CARE_PROVIDER_SITE_OTHER): Payer: Medicare Other | Admitting: *Deleted

## 2022-01-07 DIAGNOSIS — J309 Allergic rhinitis, unspecified: Secondary | ICD-10-CM

## 2022-01-14 ENCOUNTER — Ambulatory Visit (INDEPENDENT_AMBULATORY_CARE_PROVIDER_SITE_OTHER): Payer: Medicare Other

## 2022-01-14 DIAGNOSIS — J309 Allergic rhinitis, unspecified: Secondary | ICD-10-CM | POA: Diagnosis not present

## 2022-01-21 ENCOUNTER — Ambulatory Visit (INDEPENDENT_AMBULATORY_CARE_PROVIDER_SITE_OTHER): Payer: Medicare Other | Admitting: *Deleted

## 2022-01-21 DIAGNOSIS — J309 Allergic rhinitis, unspecified: Secondary | ICD-10-CM | POA: Diagnosis not present

## 2022-01-28 ENCOUNTER — Ambulatory Visit (INDEPENDENT_AMBULATORY_CARE_PROVIDER_SITE_OTHER): Payer: Medicare Other

## 2022-01-28 DIAGNOSIS — J309 Allergic rhinitis, unspecified: Secondary | ICD-10-CM

## 2022-01-29 ENCOUNTER — Ambulatory Visit: Payer: Medicare Other | Admitting: Allergy

## 2022-01-29 DIAGNOSIS — J4550 Severe persistent asthma, uncomplicated: Secondary | ICD-10-CM

## 2022-01-30 ENCOUNTER — Ambulatory Visit (INDEPENDENT_AMBULATORY_CARE_PROVIDER_SITE_OTHER): Payer: Medicare Other | Admitting: *Deleted

## 2022-01-30 DIAGNOSIS — J4550 Severe persistent asthma, uncomplicated: Secondary | ICD-10-CM | POA: Diagnosis not present

## 2022-02-02 NOTE — Progress Notes (Unsigned)
Follow Up Note  RE: Melanie Armstrong MRN: [id number] DOB: [birth year] Date of Office Visit: 02/03/2022  Referring provider: [name], MD Primary care provider: [name], MD  Chief Complaint: No chief complaint on file.  History of Present Illness: I had the pleasure of seeing Melanie Armstrong for a follow up visit at the [facility] on 02/02/2022. She is a 66 y.o. female, who is being followed for asthma on Xolair, allergic rhinitis on AIT and GERD. Her previous allergy office visit was on 08/21/2021 with Dr. [name]. Today is a regular follow up visit.  Severe persistent asthma Well-controlled with below regimen. Left knee replacement scheduled for July 3rd - needs form to be filled out to risk stratify asthma. No issues with prior joints replacements/anesthesia.  Today's spirometry was normal.  ACT score 21.  I will fax the form to your surgeon - low risk from asthma perspective as asthma is well controlled. Pretreat with albuterol prior to surgical procedure.  Daily controller medication(s): continue with Symbicort 169mg 2 puffs twice a day with spacer and rinse mouth afterwards. Continue Spiriva daily. Continue montelukast '10mg'$  daily. Continue Xolair '300mg'$  every 4 weeks - given today.  Will try to get your Xolair injection before your surgery on June 30th.  Will consider switching to Trelegy after surgery.  During upper respiratory infections/asthma flares: start Armonair Digihaler 11106m 1 puff twice a day and rinse mouth after each use for 1-2 weeks until your breathing symptoms return to baseline.  May use albuterol rescue inhaler 2 puffs every 4 to 6 hours as needed for shortness of breath, chest tightness, coughing, and wheezing. May use albuterol rescue inhaler 2 puffs 5 to 15 minutes prior to strenuous physical activities. Monitor frequency of use.  Get spirometry at next visit.   Allergic rhinitis Past history - started AIT on W-M before  2016. Interim history - doing well with weekly injections, only using Flonase 1 spray per nostril due to glaucoma history.  Continue environmental control measures (weed, molds) Continue allergy injections weekly. Continue montelukast '10mg'$  daily and allegra '180mg'$  daily.  May use azelastine 1-2 sprays per nostril at night for drainage. May use Flonase 1-2 sprays per nostril in the morning for nasal congestion.   Gastroesophageal reflux disease Controlled. Continue with omeprazole '20mg'$  in the morning.   Return in about 4 months (around 12/22/2021).  Assessment and Plan: [name] a [age].o. female with: No problem-specific Assessment & Plan notes found for this encounter.  No follow-ups on file.  No orders of the defined types were placed in this encounter.  Lab Orders  No laboratory test(s) ordered today    Diagnostics: Spirometry:  Tracings reviewed. Her effort: {Blank single:19197::"Good reproducible efforts.","It was hard to get consistent efforts and there is a question as to whether this reflects a maximal maneuver.","Poor effort, data can not be interpreted."} FVC: ***L FEV1: ***L, ***% predicted FEV1/FVC ratio: ***% Interpretation: {Blank single:19197::"Spirometry consistent with mild obstructive disease","Spirometry consistent with moderate obstructive disease","Spirometry consistent with severe obstructive disease","Spirometry consistent with possible restrictive disease","Spirometry consistent with mixed obstructive and restrictive disease","Spirometry uninterpretable due to technique","Spirometry consistent with normal pattern","No overt abnormalities noted given today's efforts"}.  Please see scanned spirometry results for details.  Skin Testing: {Blank single:19197::"Select foods","Environmental allergy panel","Environmental allergy panel and select foods","Food allergy panel","None","Deferred due to recent antihistamines use"}. *** Results discussed with  patient/family.   Medication List:  Current Outpatient Medications  Medication Sig Dispense Refill   albuterol (PROAIR HFA)  108 (90 Base) MCG/ACT inhaler Inhale 2 puffs into the lungs every 4 (four) hours as needed. 18 g 1   albuterol (PROVENTIL) (2.5 MG/3ML) 0.083% nebulizer solution USE 1 VIAL IN NEBULIZER EVERY 4 HOURS AS NEEDED FOR WHEEZING OR  SHORTNESS  OF  BREATH 600 mL 0   azelastine (ASTELIN) 0.1 % nasal spray Place into both nostrils.     benzonatate (TESSALON) 200 MG capsule benzonatate 200 mg capsule  TAKE ONE CAPSULE THREE TIMES DAILY     budesonide-formoterol (SYMBICORT) 160-4.5 MCG/ACT inhaler Inhale 2 puffs into the lungs 2 (two) times daily. 1 Inhaler 5   calcium-vitamin D (OSCAL WITH D) 500-200 MG-UNIT per tablet Take 1 tablet by mouth daily with breakfast.     diazepam (VALIUM) 5 MG tablet Take 5 mg by mouth every 6 (six) hours as needed for anxiety.     EPINEPHrine (EPIPEN 2-PAK) 0.3 mg/0.3 mL IJ SOAJ injection Inject 0.3 mg into the muscle as needed for anaphylaxis. USE AS DIRECTED FOR SEVERE ALLERGIC REACTION 1 each 1   Ferrous Sulfate (IRON) 325 (65 Fe) MG TABS      fexofenadine (ALLEGRA) 180 MG tablet Take 180 mg by mouth daily.     fluticasone (FLONASE) 50 MCG/ACT nasal spray PLACE 1-2 SPRAYS INTO BOTH NOSTRILS DAILY AS NEEDED FOR ALLERGIES OR RHINITIS. 16 mL 2   fluticasone (FLOVENT HFA) 110 MCG/ACT inhaler Inhale 2 puffs into the lungs in the morning and at bedtime. with spacer and rinse mouth afterwards. Use during asthma flares for 1-2 weeks in addition to your other inhalers. 1 each 3   furosemide (LASIX) 20 MG tablet Take 20 mg by mouth every morning.     glucosamine-chondroitin 500-400 MG tablet Take 1 tablet by mouth 2 (two) times daily.     lisinopril (ZESTRIL) 20 MG tablet Take 40 mg by mouth daily.     LUMIGAN 0.01 % SOLN SMARTSIG:In Eye(s)     montelukast (SINGULAIR) 10 MG tablet Take 1 tablet (10 mg total) by mouth at bedtime. 30 tablet 5   Multiple  Vitamin (MULTIVITAMIN WITH MINERALS) TABS tablet Take 1 tablet by mouth daily.     Omega-3 Fatty Acids (OMEGA 3 PO) Take 1 capsule by mouth daily.     omeprazole (PRILOSEC) 20 MG capsule Take 1 capsule (20 mg total) by mouth daily. 30 capsule 5   PRESCRIPTION MEDICATION once a week.     tiotropium (SPIRIVA HANDIHALER) 18 MCG inhalation capsule Place 1 capsule (18 mcg total) into inhaler and inhale 1 day or 1 dose. 30 capsule 5   vitamin C (ASCORBIC ACID) 500 MG tablet Take 500 mg by mouth daily.     Current Facility-Administered Medications  Medication Dose Route Frequency Provider Last Rate Last Admin   omalizumab Arvid Right) injection 300 mg  300 mg Subcutaneous Q28 days Jiles Prows, MD   300 mg at 01/30/22 1136   Allergies: Allergies  Allergen Reactions   Advair Diskus [Fluticasone-Salmeterol]     Per patient, she thinks she developed walking PNA.    Avelox [Moxifloxacin Hcl In Nacl] Other (See Comments)    Muscle Aches   Irbesartan Other (See Comments)   Levofloxacin Other (See Comments)    Tendon pain   Olmesartan Other (See Comments)   Other Other (See Comments)   Protonix [Pantoprazole Sodium] Diarrhea   I reviewed her past medical history, social history, family history, and environmental history and no significant changes have been reported from her previous visit.  Review of  Systems  Constitutional:  Negative for appetite change, chills, fever and unexpected weight change.  HENT:  Positive for congestion. Negative for rhinorrhea.   Eyes:  Negative for itching.  Respiratory:  Negative for cough, chest tightness, shortness of breath and wheezing.   Gastrointestinal:  Negative for abdominal pain.  Skin:  Negative for rash.  Allergic/Immunologic: Positive for environmental allergies.  Neurological:  Negative for headaches.    Objective: There were no vitals taken for this visit. There is no height or weight on file to calculate BMI. Physical Exam Vitals and nursing  note reviewed.  Constitutional:      Appearance: Normal appearance. She is well-developed.  HENT:     Head: Normocephalic and atraumatic.     Right Ear: Tympanic membrane and external ear normal.     Left Ear: Tympanic membrane and external ear normal.     Nose: Nose normal. No congestion or rhinorrhea.     Mouth/Throat:     Mouth: Mucous membranes are moist.     Pharynx: Oropharynx is clear.  Eyes:     Conjunctiva/sclera: Conjunctivae normal.  Cardiovascular:     Rate and Rhythm: Normal rate and regular rhythm.     Heart sounds: Normal heart sounds. No murmur heard. Pulmonary:     Effort: Pulmonary effort is normal.     Breath sounds: Normal breath sounds. No wheezing, rhonchi or rales.  Musculoskeletal:     Cervical back: Neck supple.  Skin:    General: Skin is warm.     Findings: No rash.  Neurological:     Mental Status: She is alert and oriented to person, place, and time.  Psychiatric:        Behavior: Behavior normal.    Previous notes and tests were reviewed. The plan was reviewed with the patient/family, and all questions/concerned were addressed.  It was my pleasure to see Merilyn today and participate in her care. Please feel free to contact me with any questions or concerns.  Sincerely,  Rexene Alberts, DO Allergy & Immunology  [facility] office: [place] office: [phone]

## 2022-02-03 ENCOUNTER — Other Ambulatory Visit: Payer: Self-pay

## 2022-02-03 ENCOUNTER — Ambulatory Visit (INDEPENDENT_AMBULATORY_CARE_PROVIDER_SITE_OTHER): Payer: Medicare Other | Admitting: Allergy

## 2022-02-03 ENCOUNTER — Encounter: Payer: Self-pay | Admitting: Allergy

## 2022-02-03 VITALS — BP 142/86 | HR 87 | Temp 97.7°F | Resp 16 | Ht 63.0 in | Wt 212.0 lb

## 2022-02-03 DIAGNOSIS — J4550 Severe persistent asthma, uncomplicated: Secondary | ICD-10-CM

## 2022-02-03 DIAGNOSIS — J309 Allergic rhinitis, unspecified: Secondary | ICD-10-CM

## 2022-02-03 DIAGNOSIS — K219 Gastro-esophageal reflux disease without esophagitis: Secondary | ICD-10-CM

## 2022-02-03 DIAGNOSIS — J3089 Other allergic rhinitis: Secondary | ICD-10-CM

## 2022-02-03 MED ORDER — EPINEPHRINE 0.3 MG/0.3ML IJ SOAJ: 0.3000 mg | INTRAMUSCULAR | 1 refills | Status: DC | PRN

## 2022-02-03 NOTE — Assessment & Plan Note (Signed)
Controlled.  Continue with omeprazole '20mg'$  in the morning.

## 2022-02-03 NOTE — Assessment & Plan Note (Signed)
Past history - started AIT on W-M before 2016. Interim history - doing well with weekly injections and does not want to change regimen for now.   Continue environmental control measures (weed, molds)  Continue allergy injections weekly - given today.   Discussed going every 2 weeks after retirement.   Continue montelukast '10mg'$  daily and allegra '180mg'$  daily.   May use azelastine 1-2 sprays per nostril at night for drainage.  Discussed limiting Flonase use to her glaucoma history.

## 2022-02-03 NOTE — Assessment & Plan Note (Addendum)
Symptoms flared since went back to work and around scents/perfumes - uses albuterol 1-2 times per day at work with good benefit. No issues with surgery.  . Today's spirometry was normal.  . Daily controller medication(s): continue with Symbicort 123mg 2 puffs twice a day with spacer and rinse mouth afterwards. o Continue Spiriva daily. o Continue montelukast '10mg'$  daily. o Continue Xolair '300mg'$  every 4 weeks.  . During upper respiratory infections/asthma flares: start Armonair Digihaler 1122m 1 puff twice a day and rinse mouth after each use for 1-2 weeks until your breathing symptoms return to baseline.  . May use albuterol rescue inhaler 2 puffs every 4 to 6 hours as needed for shortness of breath, chest tightness, coughing, and wheezing. May use albuterol rescue inhaler 2 puffs 5 to 15 minutes prior to strenuous physical activities. Monitor frequency of use.   Consider Covid-19 booster, RSV vaccine.  Get spirometry at next visit.  Discussed starting Trelegy but patient wants to wait until she retires to change any of her meds.

## 2022-02-03 NOTE — Patient Instructions (Addendum)
Asthma: Daily controller medication(s): continue with Symbicort 137mg 2 puffs twice a day with spacer and rinse mouth afterwards. Continue Spiriva daily. Continue montelukast '10mg'$  daily. Continue Xolair '300mg'$  every 4 weeks.  During upper respiratory infections/asthma flares: start Armonair Digihaler 1156m 1 puff twice a day and rinse mouth after each use for 1-2 weeks until your breathing symptoms return to baseline.  May use albuterol rescue inhaler 2 puffs every 4 to 6 hours as needed for shortness of breath, chest tightness, coughing, and wheezing. May use albuterol rescue inhaler 2 puffs 5 to 15 minutes prior to strenuous physical activities. Monitor frequency of use.  Asthma control goals:  Full participation in all desired activities (may need albuterol before activity) Albuterol use two times or less a week on average (not counting use with activity) Cough interfering with sleep two times or less a month Oral steroids no more than once a year No hospitalizations  Allergic rhinitis Continue environmental control measures (weed, molds) Continue allergy injections weekly. Continue montelukast '10mg'$  daily and allegra '180mg'$  daily.  May use azelastine 1-2 sprays per nostril at night for drainage. May use Flonase 1-2 sprays per nostril in the morning for nasal congestion. Try NOT to use Flonase due to your glaucoma.   Reflux Continue with omeprazole '20mg'$  in the morning.  Follow up in 4 months or sooner if needed.  Consider Covid-19 booster, RSV vaccine.  Reducing Pollen Exposure Pollen seasons: trees (spring), grass (summer) and ragweed/weeds (fall). Keep windows closed in your home and car to lower pollen exposure.  Install air conditioning in the bedroom and throughout the house if possible.  Avoid going out in dry windy days - especially early morning. Pollen counts are highest between 5 - 10 AM and on dry, hot and windy days.  Save outside activities for late afternoon or after  a heavy rain, when pollen levels are lower.  Avoid mowing of grass if you have grass pollen allergy. Be aware that pollen can also be transported indoors on people and pets.  Dry your clothes in an automatic dryer rather than hanging them outside where they might collect pollen.  Rinse hair and eyes before bedtime. Mold Control Mold and fungi can grow on a variety of surfaces provided certain temperature and moisture conditions exist.  Outdoor molds grow on plants, decaying vegetation and soil. The major outdoor mold, Alternaria and Cladosporium, are found in very high numbers during hot and dry conditions. Generally, a late summer - fall peak is seen for common outdoor fungal spores. Rain will temporarily lower outdoor mold spore count, but counts rise rapidly when the rainy period ends. The most important indoor molds are Aspergillus and Penicillium. Dark, humid and poorly ventilated basements are ideal sites for mold growth. The next most common sites of mold growth are the bathroom and the kitchen. Outdoor (Seasonal) Mold Control Use air conditioning and keep windows closed. Avoid exposure to decaying vegetation. Avoid leaf raking. Avoid grain handling. Consider wearing a face mask if working in moldy areas.  Indoor (Perennial) Mold Control  Maintain humidity below 50%. Get rid of mold growth on hard surfaces with water, detergent and, if necessary, 5% bleach (do not mix with other cleaners). Then dry the area completely. If mold covers an area more than 10 square feet, consider hiring an indoor environmental professional. For clothing, washing with soap and water is best. If moldy items cannot be cleaned and dried, throw them away. Remove sources e.g. contaminated carpets. Repair and seal leaking roofs  or pipes. Using dehumidifiers in damp basements may be helpful, but empty the water and clean units regularly to prevent mildew from forming. All rooms, especially basements, bathrooms and  kitchens, require ventilation and cleaning to deter mold and mildew growth. Avoid carpeting on concrete or damp floors, and storing items in damp areas.

## 2022-02-12 ENCOUNTER — Ambulatory Visit (INDEPENDENT_AMBULATORY_CARE_PROVIDER_SITE_OTHER): Payer: Medicare Other

## 2022-02-12 DIAGNOSIS — J309 Allergic rhinitis, unspecified: Secondary | ICD-10-CM | POA: Diagnosis not present

## 2022-02-18 ENCOUNTER — Ambulatory Visit (INDEPENDENT_AMBULATORY_CARE_PROVIDER_SITE_OTHER): Payer: Medicare Other

## 2022-02-18 DIAGNOSIS — J309 Allergic rhinitis, unspecified: Secondary | ICD-10-CM | POA: Diagnosis not present

## 2022-02-25 ENCOUNTER — Ambulatory Visit (INDEPENDENT_AMBULATORY_CARE_PROVIDER_SITE_OTHER): Payer: Medicare Other

## 2022-02-25 DIAGNOSIS — J309 Allergic rhinitis, unspecified: Secondary | ICD-10-CM | POA: Diagnosis not present

## 2022-02-26 DIAGNOSIS — J4550 Severe persistent asthma, uncomplicated: Secondary | ICD-10-CM

## 2022-02-27 ENCOUNTER — Ambulatory Visit (INDEPENDENT_AMBULATORY_CARE_PROVIDER_SITE_OTHER): Payer: Medicare Other | Admitting: *Deleted

## 2022-02-27 DIAGNOSIS — J4550 Severe persistent asthma, uncomplicated: Secondary | ICD-10-CM

## 2022-03-03 ENCOUNTER — Ambulatory Visit (INDEPENDENT_AMBULATORY_CARE_PROVIDER_SITE_OTHER): Payer: Medicare Other | Admitting: *Deleted

## 2022-03-03 DIAGNOSIS — J309 Allergic rhinitis, unspecified: Secondary | ICD-10-CM

## 2022-03-11 ENCOUNTER — Ambulatory Visit (INDEPENDENT_AMBULATORY_CARE_PROVIDER_SITE_OTHER): Payer: Medicare Other | Admitting: *Deleted

## 2022-03-11 DIAGNOSIS — J309 Allergic rhinitis, unspecified: Secondary | ICD-10-CM

## 2022-03-18 ENCOUNTER — Ambulatory Visit (INDEPENDENT_AMBULATORY_CARE_PROVIDER_SITE_OTHER): Payer: Medicare Other

## 2022-03-18 DIAGNOSIS — J309 Allergic rhinitis, unspecified: Secondary | ICD-10-CM | POA: Diagnosis not present

## 2022-03-19 DIAGNOSIS — J302 Other seasonal allergic rhinitis: Secondary | ICD-10-CM

## 2022-03-19 NOTE — Progress Notes (Signed)
VIAL EXP 03-20-23

## 2022-03-25 ENCOUNTER — Ambulatory Visit (INDEPENDENT_AMBULATORY_CARE_PROVIDER_SITE_OTHER): Payer: Medicare Other

## 2022-03-25 DIAGNOSIS — J309 Allergic rhinitis, unspecified: Secondary | ICD-10-CM

## 2022-03-27 ENCOUNTER — Ambulatory Visit (INDEPENDENT_AMBULATORY_CARE_PROVIDER_SITE_OTHER): Payer: Medicare Other

## 2022-03-27 DIAGNOSIS — J4550 Severe persistent asthma, uncomplicated: Secondary | ICD-10-CM | POA: Diagnosis not present

## 2022-03-28 ENCOUNTER — Encounter: Payer: Self-pay | Admitting: Internal Medicine

## 2022-03-28 ENCOUNTER — Ambulatory Visit (INDEPENDENT_AMBULATORY_CARE_PROVIDER_SITE_OTHER): Payer: Medicare Other | Admitting: Internal Medicine

## 2022-03-28 VITALS — BP 140/74 | HR 84 | Temp 98.1°F | Ht 63.0 in | Wt 208.8 lb

## 2022-03-28 DIAGNOSIS — Z8709 Personal history of other diseases of the respiratory system: Secondary | ICD-10-CM

## 2022-03-28 DIAGNOSIS — Q859 Phakomatosis, unspecified: Secondary | ICD-10-CM | POA: Diagnosis not present

## 2022-03-28 DIAGNOSIS — R911 Solitary pulmonary nodule: Secondary | ICD-10-CM | POA: Diagnosis not present

## 2022-03-28 DIAGNOSIS — Z7185 Encounter for immunization safety counseling: Secondary | ICD-10-CM | POA: Diagnosis not present

## 2022-03-28 NOTE — Addendum Note (Signed)
Addended by: Lorretta Harp on: 03/28/2022 09:20 AM   Modules accepted: Orders

## 2022-03-28 NOTE — Patient Instructions (Addendum)
ICD-10-CM   1. Right upper lobe pulmonary nodule  R91.1     2. Nodule of lower lobe of left lung  R91.1     3. Pulmonary hamartoma ([place])  Q85.9     4. History of asthma  Z87.09       LUL clustered micronodules - new Sept 2023  Plan  - repeat CT chest in 15 months  RUL micronodules Sept 2022 -> improved Sept 20023   Plan  - no futher followup  Nodule of posterior lower lobe sub-pleural of left lung 35m-9mm in CT may 10,2019 - > no change Aug 2019 -> July 2020 -> Aug 2021 -> sept 2022 -> smaller Sept 2023 ([id number]  - low prob for malignancy   Plan  -no further followo   Pulmonary hamartoma ([place]-1.9cm  - this is seen in left lung lower part and stable in CT scan May 2019 and Aug 2019 and July 2020 and  Aug 2021 -> sept 2023 (now measureing 1.8cm)  plan   no active folllowup  History of asthma  Well controlled. Managed by allergist Discussd ace inhibitor - and you are aware of it can cause cough  Plan - per allergist -   VAccine cousneling  Plan - strongly recommend RSV vaccine   Followup March 2025 after CT chest

## 2022-03-28 NOTE — Progress Notes (Signed)
IOV 09/08/2017  Chief Complaint  Patient presents with   Pulm Consult    Referred by Dr. Veverly Fells for left pulmonary nodules. Per patient, has 2 nodules on the left. Patient has a history of asthma and SOB.      Melanie Armstrong is a 66 year old non-smoker who works on a desk job doing Microbiologist.  She is here referred for left lung nodules.  The background history is that she has been on lisinopril for 15 years.  She carries a diagnosis of asthma allergic that is managed by our local allergist.  She tells me that she is been on Symbicort for at least 10 years and Spiriva for at least 5 years and the last couple of years Xolair.  She describes herself as severe persistent asthma.  Her recent spirometry May 2019 personal visualization shows no normal FEV1 and FVC.  Earlier in spring 2019 she had respiratory infection following exposure to sick contacts at work.  Since then she has had significant cough and bronchitis requiring at least 2 rounds of prednisone and antibiotics.  Things are improving and currently the cough is rated as a 3 out of 10.  She tells me as part of this persistent bronchitic cough symptoms she did have a chest x-ray that showed left lung nodule that then resulted in the CT scan of the chest Aug 20, 2017 that shows a 1.7 cm left lung nodule that is dense and I personally visualized and agree with the radiologist interpretation that this is a hamartoma.  There is another 9 mm subpleural left lower lobe posterior segment nodule and that the main reason for the referral today.  The CT scan also shows coronary artery calcification but she denies any chest pain.  She is a non-smoker.  Although when she was a kid her parents smoked.   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   Indeterminate 9 mm pulmonary nodule in posterior left lower lobe. Consider one of the following in 3  months for both low-risk and high-risk individuals: (a) repeat chest CT, (b) follow-up PET-CT, or (c) tissue sampling. This recommendation follows the consensus statement: Guidelines for Management of Incidental Pulmonary Nodules Detected on CT Images: From the Fleischner Society 2017; Radiology 2017; 284:228-243.   Incidental findings: Aortic Atherosclerosis (ICD10-I70.0). Coronary artery calcification. Tiny hiatal hernia. Small benign right adrenal adenoma.     Electronically Signed   By: Earle Gell M.D.   On: 08/21/2017 10:50   OV 11/30/2017  Chief Complaint  Patient presents with   Follow-up    CT 8/16.  Pt also had cards referral 8/9. Pt states she has been doing well since last visit and denies any complaints.     Follow-up lung nodules -  9 mm left lower lobe in the setting of stable 1.7 cm benign left lower lobe hamartoma  -  history of asthma followed by allergist.  Is a routine follow-up. She had a 3 month CT scan of the chest that is documented below. The 9 mm left lower lobe nodule is stable but in May 2019 and August 2019. The left lower lobe, was also stable. In terms of asthma she follows with an allergist. She is on ACE inhibitor but she says she has no cough and she does not feel a need for medication to be discontinued.   Ct Chest Wo Contrast  Result Date: 11/27/2017  CLINICAL DATA:  Pulmonary nodule follow-up. EXAM: CT CHEST WITHOUT CONTRAST TECHNIQUE: Multidetector CT imaging of the chest was performed following the standard protocol without IV contrast. COMPARISON:  08/20/2017 FINDINGS: Cardiovascular: The heart size is normal. No substantial pericardial effusion. Coronary artery calcification is evident. Atherosclerotic calcification is noted in the wall of the thoracic aorta. Mediastinum/Nodes: No mediastinal lymphadenopathy. No evidence for gross hilar lymphadenopathy although assessment is limited by the lack of intravenous contrast on today's study. The  esophagus has normal imaging features. There is no axillary lymphadenopathy. Lungs/Pleura: The central tracheobronchial airways are patent. Scattered peripheral tiny pulmonary nodules are similar to prior. 1.7 cm nodule left lower lobe contains central fat density consistent with benign hamartoma. 9 mm peripheral left lower lobe nodule seen on the prior study persists at 9 mm today (3:109). No pleural effusion. Upper Abdomen: Stable 15 mm right adrenal adenoma. Musculoskeletal: No worrisome lytic or sclerotic osseous abnormality. IMPRESSION: 1. 9 mm left lower lobe pulmonary nodule is stable in the interval. Three-month stable imaging follow-up is reassuring. Repeat CT chest without contrast in 6-12 months recommended to ensure continued stability. 2. Stable 1.7 cm benign pulmonary hamartoma. 3. No change scattered tiny peripheral nodules in both lungs. 4.  Aortic Atherosclerois (ICD10-170.0) Electronically Signed   By: Misty Stanley M.D.   On: 11/27/2017 16:11   OV 12/13/2018  Subjective:  Patient ID: Melanie Armstrong, female , DOB: 1955-12-16 , age 37 y.o. , MRN: [id number] , ADDRESS: [place] [address]   12/13/2018 -   Chief Complaint  Patient presents with   Nodule of lower lobe of left lung    Follow-up lung nodules -  9 mm left lower lobe in the setting of separate stable 1.7 cm benign left lower lobe hamartoma  - Hx of  history of asthma followed by allergist. - nasal inhalers, symbicort, spiriva, xolair - Also on ace inhibitors without cough   HPI Melanie Armstrong 66 y.o. -is now over 1 year since  last saw Ms. Ferryman.  Overall she is stable and doing well.  No major issues in the interim.  She is due for a flu shot and is willing to have it today.  She had a CT scan of the chest October 19, 2018 as a follow-up to August 29 CT scan of the chest.  The left lower lobe 0.8/0.9 cm pulmonary nodule is stable.  Her hematoma is also stable.  She does have coronary artery atherosclerosis.   She says she has seen Dr. Irish Lack and has been reassured.  She has allergic asthma and sees an outside allergist.  She is on nasal inhalers, Symbicort, Spiriva and Xolair.  She is also on ACE inhibitor lisinopril.  She states this does not cause cough or make the asthma worse    IMPRESSION: CT chest 10/19/2018 1. Irregular posterior left lower lobe 0.8 cm pulmonary nodule is stable since 08/20/2017 chest CT, more likely benign. Suggest continued follow-up chest CT in 12 months given the irregular morphology of this nodule. 2. Separate stable benign left lower lobe pulmonary hamartoma. 3. Three-vessel coronary atherosclerosis. 4. Small hiatal hernia.   Aortic Atherosclerosis (ICD10-I70.0).     Electronically Signed   By: Ilona Sorrel M.D.   On: 10/19/2018 14:09 ROS - per HPI    OV 12/26/2019  Subjective:  Patient ID: Melanie Armstrong, female , DOB: [birth year] , age 25 y.o. , MRN: [id number] , ADDRESS: [place] [address]  12/26/2019 -   Chief Complaint  Patient presents with   Follow-up    go over ct results   Follow-up lung nodules -  9 mm left lower lobe in the setting of separate stable 1.7 cm benign left lower lobe hamartoma  - Hx of  history of asthma followed by allergist. - nasal inhalers, symbicort, spiriva, xolair - Also on ace inhibitors without cough   HPI CLOE SOCKWELL 66 y.o. -presents for follow-up of the above issues.  Her asthma is under well control.  She is on Spiriva Symbicort and Xolair.  She does not want to reduce this.  She uses albuterol for rescue once a week.  Her allergist manages this.  She had a CT scan of the chest in August 2021 for her left lower lobe hamartoma in the chest and also left lower lobe nodule.  These are stable.  There are some spiculation reported around her nodule in the left lower lobe therefore the radiologist recommended doing another CT scan in 1 year.  This is despite the fact the nodule has been stable for 2  years.  I discussed with her the risks of getting another CT scan such as radiation but she is willing to have another CT scan done in 1 year.  There are no other active complaints.  She has had a flu shot and also the Covid vaccine.   IMPRESSION: 1. 7 x 7 mm LEFT lower lobe pulmonary nodule with irregular margins measures 7 x 7 mm on the study of 08/20/2017. Stability over 2 years time argues for benign etiology. Given irregular margins could consider continued surveillance at a 12 month interval. 2. Scattered small pulmonary nodules throughout the upper lobes also without interval change, potentially related to prior infection or inflammation. 3. Presumed pulmonary hamartoma is unchanged in the LEFT chest measuring approximately 1.9 x 1.9 cm. 4. Three-vessel coronary artery disease. 5. Small hiatal hernia. 6. Aortic atherosclerosis.   Aortic Atherosclerosis (ICD10-I70.0).     Electronically Signed   By: Zetta Bills M.D.   On: 11/28/2019 13:47      OV 01/04/2021  Subjective:  Patient ID: Melanie Armstrong, female , DOB: Feb 15, 1956 , age 57 y.o. , MRN: [id number] , ADDRESS: [place]  [id number] PCP [name], MD Patient Care Team: [name], MD as PCP - General (Family Medicine)  This Provider for this visit: Treatment Team:  Attending Provider: Brand Males, MD    01/04/2021 -   Chief Complaint  Patient presents with   Follow-up    Pt is here today to discuss results of recent CT. States she has been doing okay since last visit and denies any complaints.     HPI TEVA BRONKEMA 66 y.o. -returns for follow-up.  Her asthma and allergies being maintained by Dr. Kenton Kingfisher and also her allergist Dr. [name].  She tells me that currently her asthma is active and she is going to finish day 14 of clarithromycin shortly.  She is also going to do 5 days of prednisone starting today from her primary care physician.  In the context of this she had a CT  scan of the chest without contrast.  She has new micronodules in the right upper lobe anterior region right behind the right breast on the image.  These look very infectious.  Her old left lower lobe nodule and her left lower lobe hematoma are stable in my personal visualization.  I showed her these images.  She had a question about switching her Symbicort and Spiriva to Trelegy.  Apparently her allergist has recommended this.  I have supported this plan.   Of note her daughter is Xena Propst who is a Marine scientist in [employer] [address] at [place]:  Polyp lung nodule   EXAM: CT CHEST WITHOUT CONTRAST   TECHNIQUE: Multidetector CT imaging of the chest was performed following the standard protocol without IV contrast.   COMPARISON:  CT chest dated July 29, 2019   FINDINGS: Cardiovascular: Normal heart size. No pericardial effusion. Three-vessel coronary artery calcifications. Atherosclerotic disease of the thoracic aorta.   Mediastinum/Nodes: Small hiatal hernia. Thyroid is unremarkable. No pathologically enlarged lymph nodes in the chest.   Lungs/Pleura: Central airways are patent. No consolidation, pleural effusion or pneumothorax. New clustered small nodules of the right upper lobe. Fat containing solid pulmonary nodule of the left lower lobe located on series 5, image 65 is unchanged in size compared to prior exam measuring 1.9 x 1.9 cm additional smaller juxtapleural nodule in the left lower lobe is unchanged in size compared to prior measuring 0.7 x 0.7 cm. On image 88.   Upper Abdomen: Cholecystectomy clips stable right adrenal nodule measuring 1.2 cm, likely benign adenoma. Probable small mL the left kidney measuring 0.6 cm. No acute findings.   Musculoskeletal: No chest wall mass or suspicious bone lesions identified.   IMPRESSION: Stable solid left lower lobe pulmonary nodule measuring 0.7 cm.   New cluster of small nodules seen in the right  upper lobe, likely infectious or inflammatory.   Stable left lower lobe pulmonary hamartoma.   Aortic Atherosclerosis (ICD10-I70.0).     Electronically Signed   By: Yetta Glassman M.D.   On: 12/21/2020 08:41      OV 03/28/2022  Subjective:  Patient ID: Melanie Armstrong, female , DOB: 11-21-1955 , age 39 y.o. , MRN: [id number] , ADDRESS: [place] [id number] PCP [name], MD Patient Care Team: [name], MD as PCP - General (Family Medicine)  This Provider for this visit: Treatment Team:  Attending Provider: Brand Males, MD    03/28/2022 -   Chief Complaint  Patient presents with   Follow-up    Pt is here following up after recent CT.  Pt states she has been doing okay since last visit and denies any complaints.   Follow-up lung nodules -  9 mm left lower lobe in the setting of separate stable 1.7 cm benign left lower lobe hamartoma  - Hx of  history of asthma followed by allergist. - nasal inhalers, symbicort, spiriva, xolair - Also on ace inhibitors without cough -New right upper lobe micronodules in the setting of infectious symptoms September 2022 -> stble sept 20-23 -New clustered left upper lobe nodularity September 2023  HPI AURIE HARROUN 66 y.o. -returns for follow-up after 1 year.  She is doing well.  She has been on antibiotics 3-4 times this year because of sinus issues but no prednisone.  She has had cataract surgery knee surgery and glaucoma surgery this year but otherwise well.  Around 02/25/2022 she had a sinus infection and took antibiotics clarithromycin with primary care physician.  Asthma is well-controlled on Spiriva Singulair and also Spiriva.  She says overall she is doing well.  She has not had RSV vaccine.  I did counsel her about this and she will have it at [facility].  We discussed the ACE inhibitor intake.  She  says she cannot tolerate losartan and other ARB's.  Therefore she is opted to be on lisinopril.  It is not  causing any cough.  She feels it is not causing any asthma flares.    CT Chest data 12/18/21  Narrative & Impression  CLINICAL DATA:  Follow-up right upper lobe pulmonary nodule, low cancer risk. History of asthma.   EXAM: CT CHEST WITHOUT CONTRAST   TECHNIQUE: Multidetector CT imaging of the chest was performed following the standard protocol without IV contrast.   RADIATION DOSE REDUCTION: This exam was performed according to the departmental dose-optimization program which includes automated exposure control, adjustment of the mA and/or kV according to patient size and/or use of iterative reconstruction technique.   COMPARISON:  Multiple priors including most recent chest CT December 20, 2020.   FINDINGS: Cardiovascular: Aortic atherosclerosis. Three-vessel coronary artery calcifications. Normal size heart. No significant pericardial effusion/thickening.   Mediastinum/Nodes: No suspicious thyroid nodule. No pathologically enlarged mediastinal, hilar or axillary lymph nodes. Small hiatal hernia.   Lungs/Pleura: Stable size of the macroscopic fat containing solid pulmonary nodules in the left lower lobe measuring 18 x 18 mm on image 71/5 previously 19 x 19 mm. Additional smaller juxta pleural nodule in the left lower lobe is also stable in size measuring 7 x 6 mm on image 102/5 previously 7 x 7 mm. Tiny clustered nodules in the right upper lobe for instance on images 30-34/5 are similar prior and likely infectious or inflammatory. New clustered nodularity in the left upper lobe for instance on image 29/5 and 37/5. No pleural effusion. No pneumothorax.   Upper Abdomen: Gallbladder surgically absent.   Musculoskeletal: Multilevel degenerative changes spine.   IMPRESSION: 1. Stable solid 7 mm left lower lobe pulmonary nodule. 2. Stable clustered nodules in the right upper lobe with new clustered nodules in the left upper lobe, likely infectious or inflammatory. 3.  Stable left lower lobe pulmonary hamartoma. 4.  Aortic Atherosclerosis (ICD10-I70.0).     Electronically Signed   By: Dahlia Bailiff M.D.   On: 12/18/2021 15:07      No results found.    PFT      No data to display             has a past medical history of Arthritis, Asthma, GERD (gastroesophageal reflux disease), Heart murmur, and Hypertension.   reports that she has never smoked. She has never used smokeless tobacco.  Past Surgical History:  Procedure Laterality Date   CHOLECYSTECTOMY     TONSILLECTOMY     TOTAL HIP ARTHROPLASTY Right 08/22/2014   Procedure: RIGHT TOTAL HIP ARTHROPLASTY ANTERIOR APPROACH;  Surgeon: Paralee Cancel, MD;  Location: [facility];  Service: Orthopedics;  Laterality: Right;   TOTAL HIP ARTHROPLASTY Left 01/2019    Allergies  Allergen Reactions   Advair Diskus [Fluticasone-Salmeterol]     Per patient, she thinks she developed walking PNA.    Avelox [Moxifloxacin Hcl In Nacl] Other (See Comments)    Muscle Aches   Irbesartan Other (See Comments)   Levofloxacin Other (See Comments)    Tendon pain   Olmesartan Other (See Comments)   Other Other (See Comments)   Protonix [Pantoprazole Sodium] Diarrhea    Immunization History  Administered Date(s) Administered   Influenza,inj,Quad PF,6+ Mos 01/26/2018, 12/13/2018   Influenza-Unspecified 01/13/2012, 12/16/2019   Pneumococcal Conjugate-13 04/14/2016   Tdap 11/12/2009    Family History  Problem Relation Age of Onset   Heart disease Father    Allergic rhinitis Neg Hx  Angioedema Neg Hx    Asthma Neg Hx    Eczema Neg Hx    Immunodeficiency Neg Hx    Urticaria Neg Hx      Current Outpatient Medications:    albuterol (PROAIR HFA) 108 (90 Base) MCG/ACT inhaler, Inhale 2 puffs into the lungs every 4 (four) hours as needed., Disp: 18 g, Rfl: 1   albuterol (PROVENTIL) (2.5 MG/3ML) 0.083% nebulizer solution, USE 1 VIAL IN NEBULIZER EVERY 4 HOURS AS NEEDED FOR WHEEZING OR  SHORTNESS  OF   BREATH, Disp: 600 mL, Rfl: 0   azelastine (ASTELIN) 0.1 % nasal spray, Place into both nostrils., Disp: , Rfl:    budesonide-formoterol (SYMBICORT) 160-4.5 MCG/ACT inhaler, Inhale 2 puffs into the lungs 2 (two) times daily., Disp: 1 Inhaler, Rfl: 5   calcium-vitamin D (OSCAL WITH D) 500-200 MG-UNIT per tablet, Take 1 tablet by mouth daily with breakfast., Disp: , Rfl:    diazepam (VALIUM) 5 MG tablet, Take 5 mg by mouth every 6 (six) hours as needed for anxiety., Disp: , Rfl:    EPINEPHrine 0.3 mg/0.3 mL IJ SOAJ injection, Inject 0.3 mg into the muscle as needed for anaphylaxis., Disp: 2 each, Rfl: 1   Ferrous Sulfate (IRON) 325 (65 Fe) MG TABS, , Disp: , Rfl:    fexofenadine (ALLEGRA) 180 MG tablet, Take 180 mg by mouth daily., Disp: , Rfl:    fluticasone (FLONASE) 50 MCG/ACT nasal spray, PLACE 1-2 SPRAYS INTO BOTH NOSTRILS DAILY AS NEEDED FOR ALLERGIES OR RHINITIS., Disp: 16 mL, Rfl: 2   furosemide (LASIX) 20 MG tablet, Take 20 mg by mouth every morning., Disp: , Rfl:    glucosamine-chondroitin 500-400 MG tablet, Take 1 tablet by mouth 2 (two) times daily., Disp: , Rfl:    lisinopril (ZESTRIL) 20 MG tablet, Take 40 mg by mouth daily., Disp: , Rfl:    montelukast (SINGULAIR) 10 MG tablet, Take 1 tablet (10 mg total) by mouth at bedtime., Disp: 30 tablet, Rfl: 5   Multiple Vitamin (MULTIVITAMIN WITH MINERALS) TABS tablet, Take 1 tablet by mouth daily., Disp: , Rfl:    Omega-3 Fatty Acids (OMEGA 3 PO), Take 1 capsule by mouth daily., Disp: , Rfl:    omeprazole (PRILOSEC) 20 MG capsule, Take 1 capsule (20 mg total) by mouth daily., Disp: 30 capsule, Rfl: 5   PRESCRIPTION MEDICATION, once a week., Disp: , Rfl:    ROCKLATAN 0.02-0.005 % SOLN, Apply 1 drop to eye at bedtime., Disp: , Rfl:    tiotropium (SPIRIVA HANDIHALER) 18 MCG inhalation capsule, Place 1 capsule (18 mcg total) into inhaler and inhale 1 day or 1 dose., Disp: 30 capsule, Rfl: 5   vitamin C (ASCORBIC ACID) 500 MG tablet, Take 500 mg by  mouth daily., Disp: , Rfl:   Current Facility-Administered Medications:    omalizumab Arvid Right) injection 300 mg, 300 mg, Subcutaneous, Q28 days, Kozlow, Donnamarie Poag, MD, 300 mg at 03/27/22 1427      Objective:   Vitals:   03/28/22 0850  BP: (!) 140/74  Pulse: 84  Temp: 98.1 F (36.7 C)  TempSrc: Oral  SpO2: 100%  Weight: 208 lb 12.8 oz (94.7 kg)  Height: '5\' 3"'$  (1.6 m)    Estimated body mass index is 36.99 kg/m as calculated from the following:   Height as of this encounter: '5\' 3"'$  (1.6 m).   Weight as of this encounter: 208 lb 12.8 oz (94.7 kg).  '@WEIGHTCHANGE'$ @  Filed Weights   03/28/22 0850  Weight: 208 lb 12.8  oz (94.7 kg)     Physical Exam    General: No distress. Looks well Neuro: Alert and Oriented x 3. GCS 15. Speech normal Psych: Pleasant Resp:  Barrel Chest - no.  Wheeze - no, Crackles - no, No overt respiratory distress CVS: Normal heart sounds. Murmurs - no Ext: Stigmata of Connective Tissue Disease - no HEENT: Normal upper airway. PEERL +. No post nasal drip        Assessment:       ICD-10-CM   1. Left upper lobe pulmonary nodule  R91.1     2. Right upper lobe pulmonary nodule  R91.1     3. Nodule of lower lobe of left lung  R91.1     4. Pulmonary hamartoma ([place])  Q85.9     5. History of asthma  Z87.09     6. Vaccine counseling  Z71.85          Plan:     Patient Instructions     ICD-10-CM   1. Right upper lobe pulmonary nodule  R91.1     2. Nodule of lower lobe of left lung  R91.1     3. Pulmonary hamartoma ([place])  Q85.9     4. History of asthma  Z87.09       LUL clustered micronodules - new Sept 2023  Plan  - repeat CT chest in 15 months  RUL micronodules Sept 2022 -> improved Sept 20023   Plan  - no futher followup  Nodule of posterior lower lobe sub-pleural of left lung 10m-9mm in CT may 10,2019 - > no change Aug 2019 -> July 2020 -> Aug 2021 -> sept 2022 -> smaller Sept 2023 (7x680m  - low prob for malignancy    Plan  -no further followo   Pulmonary hamartoma ([place]1.9cm  - this is seen in left lung lower part and stable in CT scan May 2019 and Aug 2019 and July 2020 and  Aug 2021 -> sept 2023 (now measureing 1.8cm)  plan   no active folllowup  History of asthma  Well controlled. Managed by allergist Discussd ace inhibitor - and you are aware of it can cause cough  Plan - per allergist -   VAccine cousneling  Plan - strongly recommend RSV vaccine   Followup March 2025 after CT chest    SIGNATURE    Dr. MuBrand MalesM.D., F.C.C.P,  Pulmonary and Critical Care Medicine Staff Physician, [facility] - Interstitial Lung Disease  Program  Pulmonary [id number]: [phone] no answer or between  15:00h - 7:00h: call [phone] Telephone: [phone]  9:08 AM 03/28/2022

## 2022-04-01 ENCOUNTER — Ambulatory Visit (INDEPENDENT_AMBULATORY_CARE_PROVIDER_SITE_OTHER): Payer: Medicare Other

## 2022-04-01 DIAGNOSIS — J309 Allergic rhinitis, unspecified: Secondary | ICD-10-CM | POA: Diagnosis not present

## 2022-04-09 ENCOUNTER — Ambulatory Visit (INDEPENDENT_AMBULATORY_CARE_PROVIDER_SITE_OTHER): Payer: Medicare Other

## 2022-04-09 DIAGNOSIS — J309 Allergic rhinitis, unspecified: Secondary | ICD-10-CM | POA: Diagnosis not present

## 2022-04-11 IMAGING — CT CT CHEST W/O CM
2 of 4 series · 15 of 36 positions shown, 18 images · non-contrast
Comparison: None.

CLINICAL DATA: Lung nodule follow-up.

EXAM:
CT CHEST WITHOUT CONTRAST
TECHNIQUE: Multidetector CT imaging of the chest was performed following the
standard protocol without IV contrast.

[Series 2: thorax · axial · 0.76mm/px · z∈[-306,-48]mm · 12 of 153 slices shown, 15 images]
[im 12/153  mediastinal]
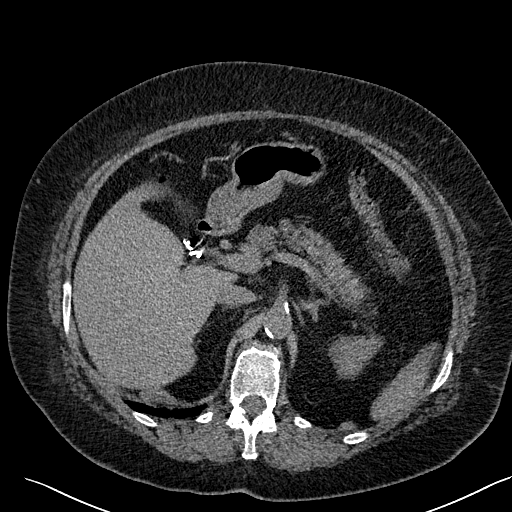
[im 12/153  lung]
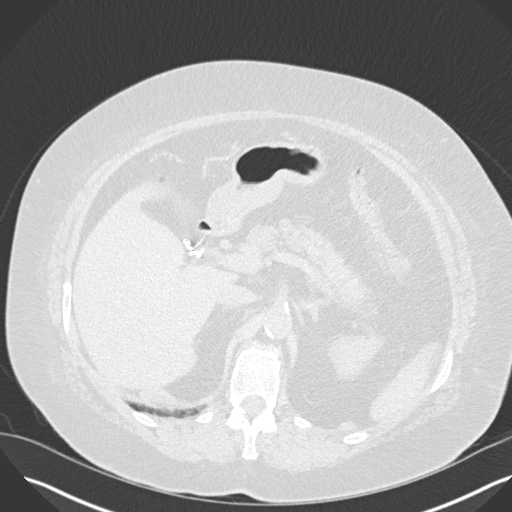
[im 24/153  lung]
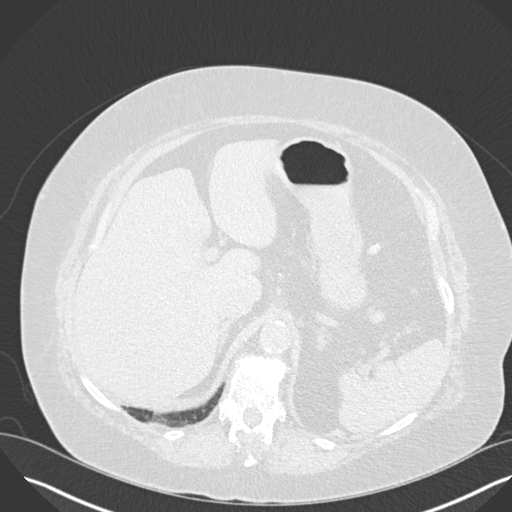
[im 36/153  lung]
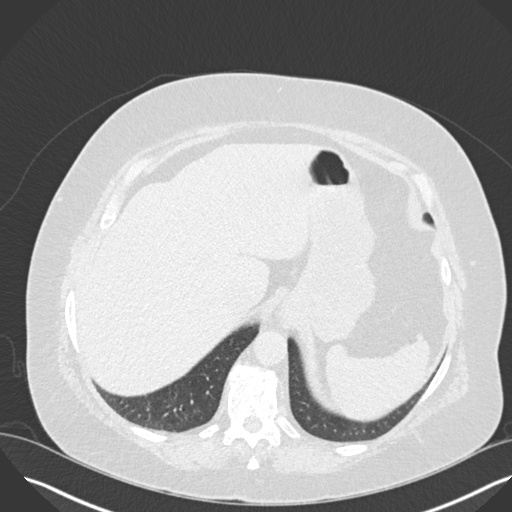
[im 47/153  lung]
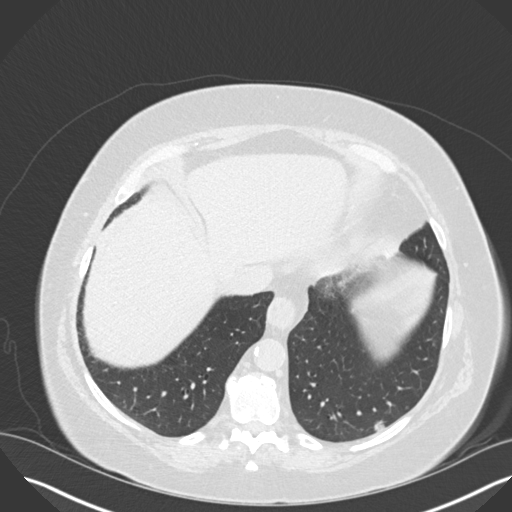
[im 59/153  mediastinal]
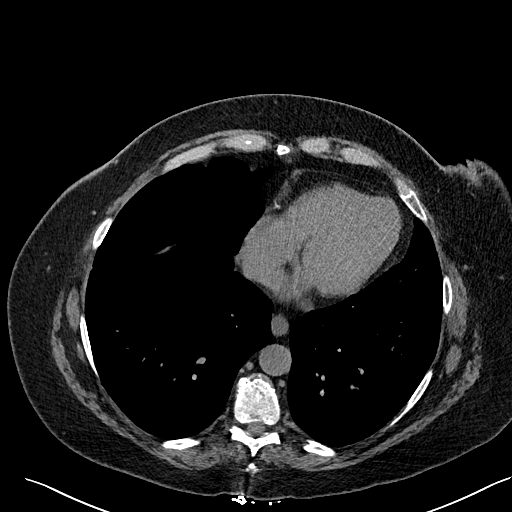
[im 59/153  lung]
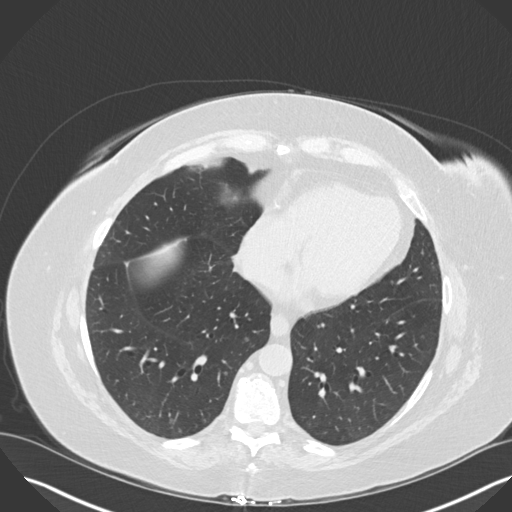
[im 71/153  lung]
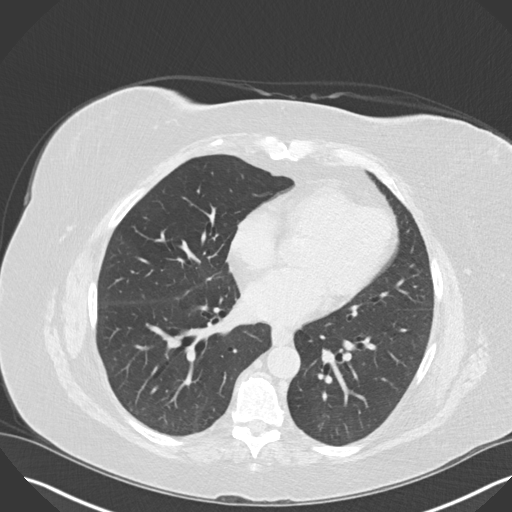
[im 82/153  lung]
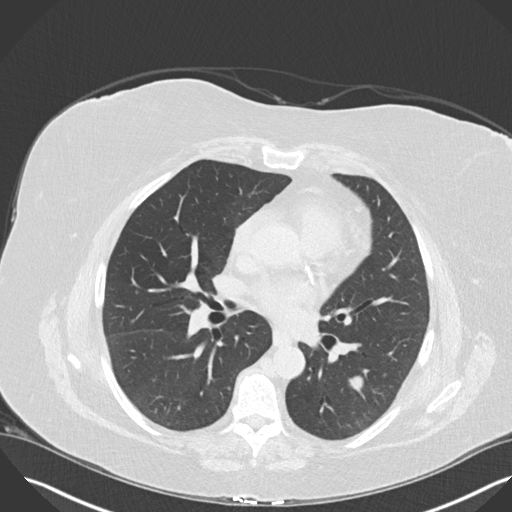
[im 94/153  lung]
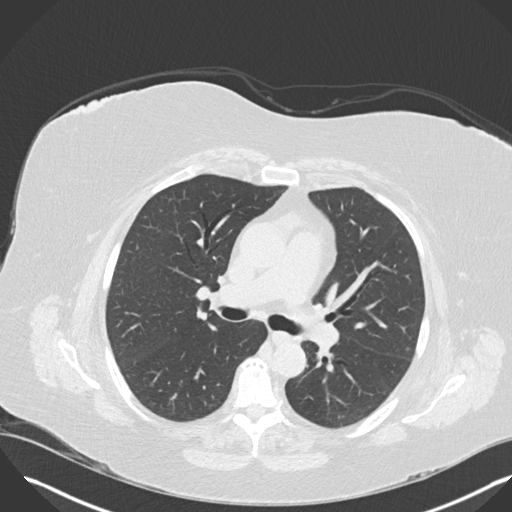
[im 106/153  mediastinal]
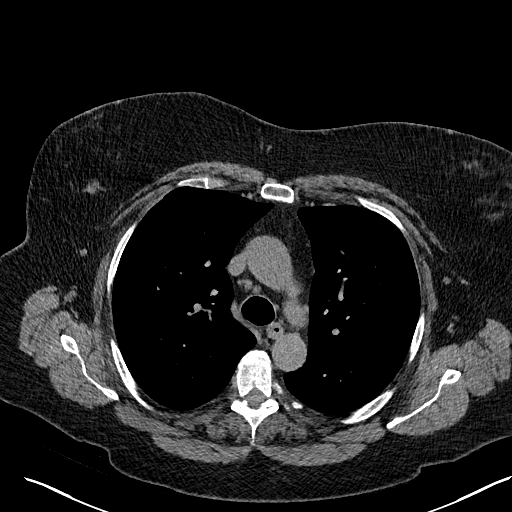
[im 106/153  lung]
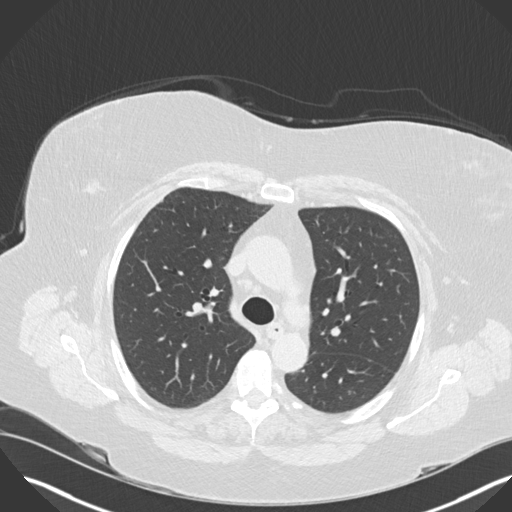
[im 117/153  lung]
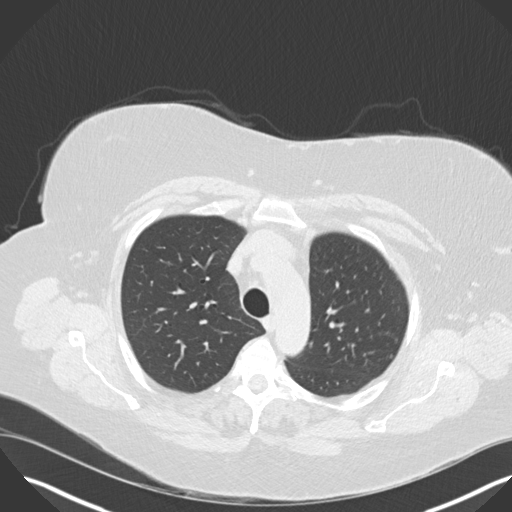
[im 129/153  lung]
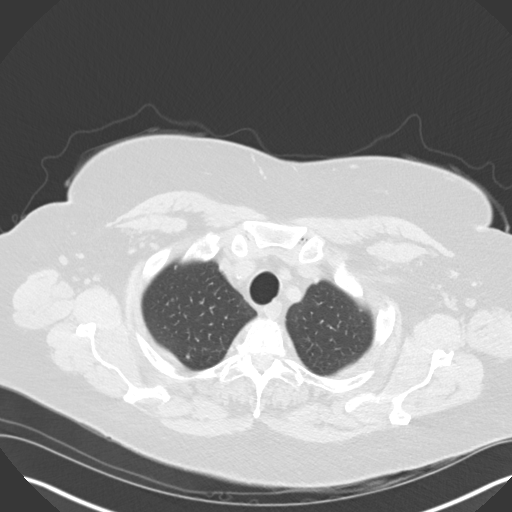
[im 141/153  lung]
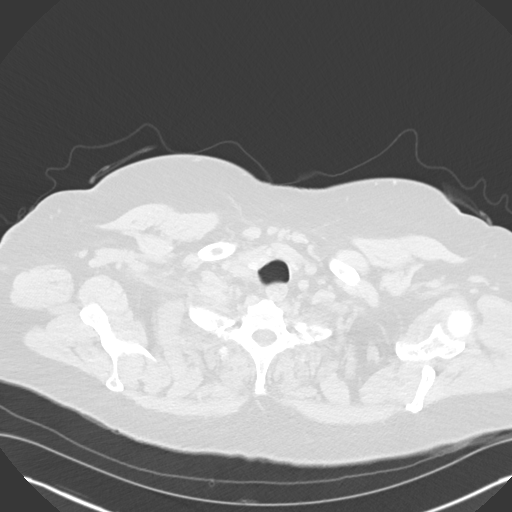

[Series 5: coronal · coronal · 0.60mm/px · 3 of 138 slices shown]
[im 28/138  lung]
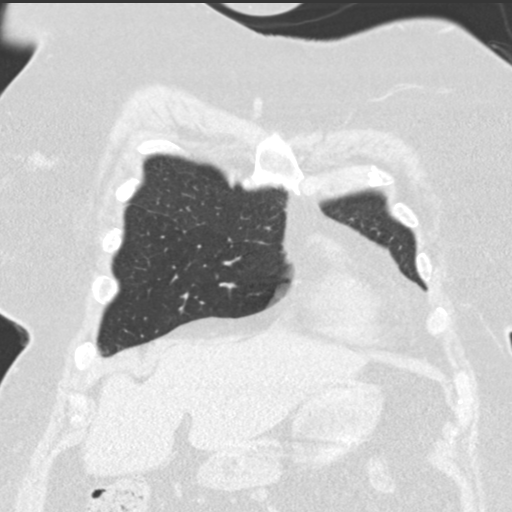
[im 55/138  lung]
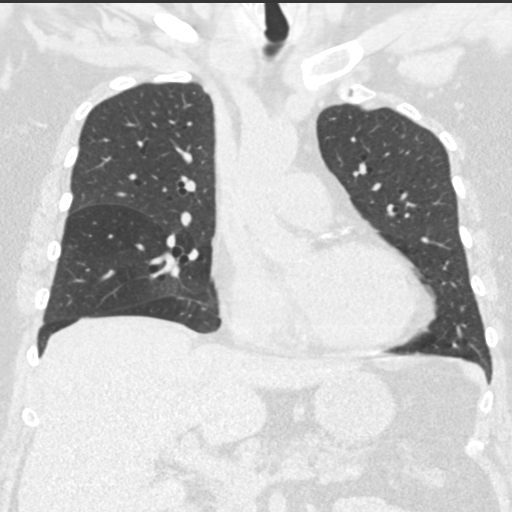
[im 83/138  lung]
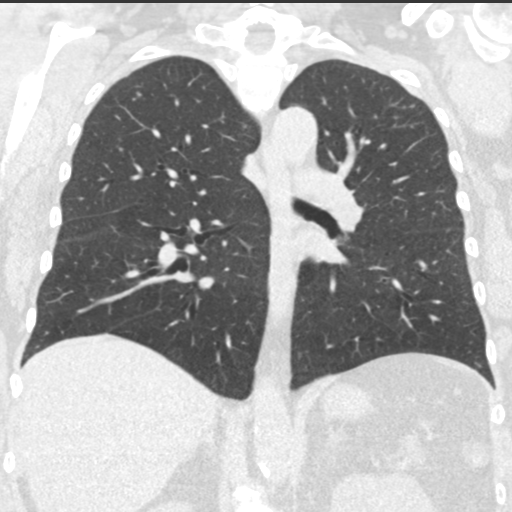

[15 of 36 positions shown; findings below may reference images not displayed]

FINDINGS: Cardiovascular: Calcified atheromatous plaque of the thoracic aorta.
No aneurysmal dilation. Limited assessment of cardiac and vascular
structures in the chest due to lack of contrast. Three-vessel
coronary artery disease.

No pericardial effusion. Central pulmonary vasculature is of normal
caliber.

Mediastinum/Nodes: Small hiatal hernia.  Esophagus grossly normal.

No thoracic inlet adenopathy.  No axillary lymphadenopathy.

Lungs/Pleura: Presumed pulmonary hamartoma is unchanged in the LEFT
chest measuring approximately 1.9 x 1.9 cm.

Scattered small pulmonary nodules throughout the upper lobes also
without interval change.

No consolidation.  No pleural effusion.

(Image 105, series [DATE] x 7 mm LEFT lower lobe pulmonary nodule with
irregular margins measures 7 x 7 mm on the study of 08/20/2017.

Upper Abdomen: Incidental imaging of upper abdominal contents
without acute process. RIGHT adrenal calcification unchanged.
Limited assessment due to respiratory motion and limited scan
coverage.

Musculoskeletal: No acute musculoskeletal process. Spinal
degenerative changes.
IMPRESSION: 1. 7 x 7 mm LEFT lower lobe pulmonary nodule with irregular margins
measures 7 x 7 mm on the study of 08/20/2017. Stability over 2 years
time argues for benign etiology. Given irregular margins could
consider continued surveillance at a 12 month interval.
2. Scattered small pulmonary nodules throughout the upper lobes also
without interval change, potentially related to prior infection or
inflammation.
3. Presumed pulmonary hamartoma is unchanged in the LEFT chest
measuring approximately 1.9 x 1.9 cm.
4. Three-vessel coronary artery disease.
5. Small hiatal hernia.
6. Aortic atherosclerosis.

Aortic Atherosclerosis ([id number]).

## 2022-04-17 ENCOUNTER — Ambulatory Visit (INDEPENDENT_AMBULATORY_CARE_PROVIDER_SITE_OTHER): Payer: Medicare Other

## 2022-04-17 DIAGNOSIS — J309 Allergic rhinitis, unspecified: Secondary | ICD-10-CM | POA: Diagnosis not present

## 2022-04-21 ENCOUNTER — Ambulatory Visit (INDEPENDENT_AMBULATORY_CARE_PROVIDER_SITE_OTHER): Payer: Medicare Other

## 2022-04-21 DIAGNOSIS — J309 Allergic rhinitis, unspecified: Secondary | ICD-10-CM | POA: Diagnosis not present

## 2022-04-24 ENCOUNTER — Ambulatory Visit (INDEPENDENT_AMBULATORY_CARE_PROVIDER_SITE_OTHER): Payer: Medicare Other

## 2022-04-24 DIAGNOSIS — J4550 Severe persistent asthma, uncomplicated: Secondary | ICD-10-CM

## 2022-04-29 ENCOUNTER — Ambulatory Visit (INDEPENDENT_AMBULATORY_CARE_PROVIDER_SITE_OTHER): Payer: Medicare Other

## 2022-04-29 DIAGNOSIS — J309 Allergic rhinitis, unspecified: Secondary | ICD-10-CM

## 2022-05-07 ENCOUNTER — Ambulatory Visit (INDEPENDENT_AMBULATORY_CARE_PROVIDER_SITE_OTHER): Payer: Medicare Other

## 2022-05-07 DIAGNOSIS — J309 Allergic rhinitis, unspecified: Secondary | ICD-10-CM | POA: Diagnosis not present

## 2022-05-12 ENCOUNTER — Ambulatory Visit (INDEPENDENT_AMBULATORY_CARE_PROVIDER_SITE_OTHER): Payer: Medicare Other

## 2022-05-12 DIAGNOSIS — J309 Allergic rhinitis, unspecified: Secondary | ICD-10-CM | POA: Diagnosis not present

## 2022-05-19 ENCOUNTER — Ambulatory Visit (INDEPENDENT_AMBULATORY_CARE_PROVIDER_SITE_OTHER): Payer: Medicare Other

## 2022-05-19 DIAGNOSIS — J309 Allergic rhinitis, unspecified: Secondary | ICD-10-CM | POA: Diagnosis not present

## 2022-05-21 ENCOUNTER — Ambulatory Visit (INDEPENDENT_AMBULATORY_CARE_PROVIDER_SITE_OTHER): Payer: Medicare Other

## 2022-05-21 DIAGNOSIS — J4550 Severe persistent asthma, uncomplicated: Secondary | ICD-10-CM

## 2022-05-28 ENCOUNTER — Ambulatory Visit (INDEPENDENT_AMBULATORY_CARE_PROVIDER_SITE_OTHER): Payer: Medicare Other

## 2022-05-28 DIAGNOSIS — J309 Allergic rhinitis, unspecified: Secondary | ICD-10-CM | POA: Diagnosis not present

## 2022-06-02 ENCOUNTER — Ambulatory Visit (INDEPENDENT_AMBULATORY_CARE_PROVIDER_SITE_OTHER): Payer: Medicare Other | Admitting: *Deleted

## 2022-06-02 DIAGNOSIS — J309 Allergic rhinitis, unspecified: Secondary | ICD-10-CM

## 2022-06-02 NOTE — Progress Notes (Signed)
VIAL EXP 06-03-23

## 2022-06-03 DIAGNOSIS — J302 Other seasonal allergic rhinitis: Secondary | ICD-10-CM | POA: Diagnosis not present

## 2022-06-09 ENCOUNTER — Ambulatory Visit (INDEPENDENT_AMBULATORY_CARE_PROVIDER_SITE_OTHER): Payer: Medicare Other | Admitting: *Deleted

## 2022-06-09 ENCOUNTER — Ambulatory Visit: Payer: Medicare Other | Admitting: Allergy

## 2022-06-09 DIAGNOSIS — J309 Allergic rhinitis, unspecified: Secondary | ICD-10-CM

## 2022-06-11 ENCOUNTER — Ambulatory Visit (INDEPENDENT_AMBULATORY_CARE_PROVIDER_SITE_OTHER): Payer: Medicare Other | Admitting: Allergy

## 2022-06-11 ENCOUNTER — Encounter: Payer: Self-pay | Admitting: Allergy

## 2022-06-11 VITALS — BP 130/76 | HR 72 | Temp 98.0°F | Resp 18 | Ht 63.0 in | Wt 210.0 lb

## 2022-06-11 DIAGNOSIS — J3089 Other allergic rhinitis: Secondary | ICD-10-CM | POA: Diagnosis not present

## 2022-06-11 DIAGNOSIS — K219 Gastro-esophageal reflux disease without esophagitis: Secondary | ICD-10-CM | POA: Diagnosis not present

## 2022-06-11 DIAGNOSIS — J4550 Severe persistent asthma, uncomplicated: Secondary | ICD-10-CM | POA: Diagnosis not present

## 2022-06-11 MED ORDER — TIOTROPIUM BROMIDE MONOHYDRATE 18 MCG IN CAPS: 18.0000 ug | ORAL_CAPSULE | Freq: Every day | RESPIRATORY_TRACT | 11 refills | Status: DC

## 2022-06-11 MED ORDER — BUDESONIDE-FORMOTEROL FUMARATE 160-4.5 MCG/ACT IN AERO: 2.0000 | INHALATION_SPRAY | Freq: Two times a day (BID) | RESPIRATORY_TRACT | 11 refills | Status: DC

## 2022-06-11 NOTE — Progress Notes (Signed)
Follow Up Note  RE: Melanie Armstrong MRN: [id number] DOB: 25-Aug-1955 Date of Office Visit: 06/11/2022  Referring provider: [name], MD Primary care provider: [name], MD  Chief Complaint: Asthma  History of Present Illness: I had the pleasure of seeing Melanie Armstrong for a follow up visit at the [facility] [place] on 06/11/2022. She is a 67 y.o. female, who is being followed for asthma, allergic rhinitis on AIT and GERD. Her previous allergy office visit was on 02/03/2022 with Dr. [name]. Today is a regular follow up visit.  Patient's glaucoma is stable as per her ophthalmologist.  She has a torn rotator cuff and planning to get surgery soon. She doesn't want to switch any of her medications as she has been doing well.   Severe persistent asthma Currently on Symbicort 174mg 2 puffs BID, Spiriva daily, Singulair daily and Xolair '300mg'$  every 4 weeks and doing well.  Denies any SOB, coughing, wheezing, chest tightness, nocturnal awakenings, ER/urgent care visits or prednisone use since the last visit.  Used albuterol a few times with good benefit.  Follows with pulmonology for benign nodules.  Allergic rhinitis Some sneezing. Currently on AIT and doing well. Would like to keep to weekly injections for now.  Using azelastine 1 spray per nostril at night.    Gastroesophageal reflux disease Controlled with omeprazole '20mg'$  in the morning.  Assessment and Plan: [name] a 67y.o. female with: Severe persistent asthma, uncomplicated Doing well with below regimen. Used albuterol a few times. No prednisone. Follows with pulmonology for pulmonary nodules. Doesn't want to switch regimen prior to her upcoming surgery. Today's spirometry was normal.  Make sure you take in your inhalers the day of your surgery. Daily controller medication(s): continue with Symbicort 1661m 2 puffs twice a day with spacer and rinse mouth afterwards. Continue Spiriva daily. Gave a  sample of Spiriva Respimat 1.2546m2 puffs once a day. See if this is cheaper or works better. Continue montelukast '10mg'$  daily. Continue Xolair '300mg'$  every 4 weeks.  During respiratory infections/asthma flares: start Armonair Digihaler 113m45m puff twice a day and rinse mouth after each use for 1-2 weeks until your breathing symptoms return to baseline.  May use albuterol rescue inhaler 2 puffs every 4 to 6 hours as needed for shortness of breath, chest tightness, coughing, and wheezing. May use albuterol rescue inhaler 2 puffs 5 to 15 minutes prior to strenuous physical activities. Monitor frequency of use.  Get spirometry at next visit. Discussed starting Trelegy/Breztri but patient wants to wait until next visit.   Allergic rhinitis Past history - started AIT on W-M before 2016. Interim history - doing well with weekly injections and does not want to change regimen.  Continue environmental control measures (weed, molds) Continue allergy injections weekly. Continue montelukast '10mg'$  daily and allegra '180mg'$  daily.  May use azelastine 1-2 sprays per nostril at night for drainage. Advised patient to use Flonase sparingly due to her open angle glaucoma which is stable.   Gastroesophageal reflux disease Continue with omeprazole '20mg'$  in the morning.  Return in about 4 months (around 10/10/2022).  Meds ordered this encounter  Medications   budesonide-formoterol (SYMBICORT) 160-4.5 MCG/ACT inhaler    Sig: Inhale 2 puffs into the lungs in the morning and at bedtime. with spacer and rinse mouth afterwards.    Dispense:  1 each    Refill:  11   tiotropium (SPIRIVA HANDIHALER) 18 MCG inhalation capsule    Sig: Place 1 capsule (  18 mcg total) into inhaler and inhale daily.    Dispense:  30 capsule    Refill:  11   Lab Orders  No laboratory test(s) ordered today    Diagnostics: Spirometry:  Tracings reviewed. Her effort: Good reproducible efforts. FVC: 2.74L FEV1: 2.37L, 107%  predicted FEV1/FVC ratio: 86% Interpretation: Spirometry consistent with normal pattern.  Please see scanned spirometry results for details.  Medication List:  Current Outpatient Medications  Medication Sig Dispense Refill   albuterol (PROAIR HFA) 108 (90 Base) MCG/ACT inhaler Inhale 2 puffs into the lungs every 4 (four) hours as needed. 18 g 1   albuterol (PROVENTIL) (2.5 MG/3ML) 0.083% nebulizer solution USE 1 VIAL IN NEBULIZER EVERY 4 HOURS AS NEEDED FOR WHEEZING OR  SHORTNESS  OF  BREATH 600 mL 0   azelastine (ASTELIN) 0.1 % nasal spray Place into both nostrils.     betaxolol (BETOPTIC-S) 0.5 % ophthalmic suspension 1 drop 2 (two) times daily.     budesonide-formoterol (SYMBICORT) 160-4.5 MCG/ACT inhaler Inhale 2 puffs into the lungs in the morning and at bedtime. with spacer and rinse mouth afterwards. 1 each 11   calcium-vitamin D (OSCAL WITH D) 500-200 MG-UNIT per tablet Take 1 tablet by mouth daily with breakfast.     diazepam (VALIUM) 5 MG tablet Take 5 mg by mouth every 6 (six) hours as needed for anxiety.     dorzolamide (TRUSOPT) 2 % ophthalmic solution      EPINEPHrine 0.3 mg/0.3 mL IJ SOAJ injection Inject 0.3 mg into the muscle as needed for anaphylaxis. 2 each 1   Ferrous Sulfate (IRON) 325 (65 Fe) MG TABS      fexofenadine (ALLEGRA) 180 MG tablet Take 180 mg by mouth daily.     fluticasone (FLONASE) 50 MCG/ACT nasal spray PLACE 1-2 SPRAYS INTO BOTH NOSTRILS DAILY AS NEEDED FOR ALLERGIES OR RHINITIS. 16 mL 2   furosemide (LASIX) 20 MG tablet Take 20 mg by mouth every morning.     glucosamine-chondroitin 500-400 MG tablet Take 1 tablet by mouth 2 (two) times daily.     latanoprost (XALATAN) 0.005 % ophthalmic solution 1 drop at bedtime.     lisinopril (ZESTRIL) 20 MG tablet Take 40 mg by mouth daily.     montelukast (SINGULAIR) 10 MG tablet Take 1 tablet (10 mg total) by mouth at bedtime. 30 tablet 5   Multiple Vitamin (MULTIVITAMIN WITH MINERALS) TABS tablet Take 1 tablet by  mouth daily.     Omega-3 Fatty Acids (OMEGA 3 PO) Take 1 capsule by mouth daily.     omeprazole (PRILOSEC) 20 MG capsule Take 1 capsule (20 mg total) by mouth daily. 30 capsule 5   PRESCRIPTION MEDICATION once a week.     ROCKLATAN 0.02-0.005 % SOLN Apply 1 drop to eye at bedtime.     tiotropium (SPIRIVA HANDIHALER) 18 MCG inhalation capsule Place 1 capsule (18 mcg total) into inhaler and inhale daily. 30 capsule 11   vitamin C (ASCORBIC ACID) 500 MG tablet Take 500 mg by mouth daily.     Current Facility-Administered Medications  Medication Dose Route Frequency Provider Last Rate Last Admin   omalizumab Arvid Right) injection 300 mg  300 mg Subcutaneous Q28 days Jiles Prows, MD   300 mg at 05/21/22 0945   Allergies: Allergies  Allergen Reactions   Advair Diskus [Fluticasone-Salmeterol]     Per patient, she thinks she developed walking PNA.    Avelox [Moxifloxacin Hcl In Nacl] Other (See Comments)    Muscle Aches  Irbesartan Other (See Comments)   Levofloxacin Other (See Comments)    Tendon pain   Olmesartan Other (See Comments)   Other Other (See Comments)   Protonix [Pantoprazole Sodium] Diarrhea   I reviewed her past medical history, social history, family history, and environmental history and no significant changes have been reported from her previous visit.  Review of Systems  Constitutional:  Negative for appetite change, chills, fever and unexpected weight change.  HENT:  Positive for rhinorrhea. Negative for congestion.   Eyes:  Negative for itching.  Respiratory:  Negative for cough, chest tightness, shortness of breath and wheezing.   Gastrointestinal:  Negative for abdominal pain.  Skin:  Negative for rash.  Allergic/Immunologic: Positive for environmental allergies.  Neurological:  Negative for headaches.    Objective: BP 130/76   Pulse 72   Temp 98 F (36.7 C)   Resp 18   Ht '5\' 3"'$  (1.6 m)   Wt 210 lb (95.3 kg)   SpO2 96%   BMI 37.20 kg/m  Body mass  index is 37.2 kg/m. Physical Exam Vitals and nursing note reviewed.  Constitutional:      Appearance: Normal appearance. She is well-developed.  HENT:     Head: Normocephalic and atraumatic.     Right Ear: Tympanic membrane and external ear normal.     Left Ear: Tympanic membrane and external ear normal.     Nose: Nose normal. No congestion or rhinorrhea.     Mouth/Throat:     Mouth: Mucous membranes are moist.     Pharynx: Oropharynx is clear.  Eyes:     Conjunctiva/sclera: Conjunctivae normal.  Cardiovascular:     Rate and Rhythm: Normal rate and regular rhythm.     Heart sounds: Normal heart sounds. No murmur heard. Pulmonary:     Effort: Pulmonary effort is normal.     Breath sounds: Normal breath sounds. No wheezing, rhonchi or rales.  Musculoskeletal:     Cervical back: Neck supple.  Skin:    General: Skin is warm.     Findings: No rash.  Neurological:     Mental Status: She is alert and oriented to person, place, and time.  Psychiatric:        Behavior: Behavior normal.   Previous notes and tests were reviewed. The plan was reviewed with the patient/family, and all questions/concerned were addressed.  It was my pleasure to see Melanie Armstrong today and participate in her care. Please feel free to contact me with any questions or concerns.  Sincerely,  Rexene Alberts, DO Allergy & Immunology  [facility] office: [place] office: [phone]

## 2022-06-11 NOTE — Assessment & Plan Note (Addendum)
Past history - started AIT on W-M before 2016. Interim history - doing well with weekly injections and does not want to change regimen.  Continue environmental control measures (weed, molds) Continue allergy injections weekly. Continue montelukast '10mg'$  daily and allegra '180mg'$  daily.  May use azelastine 1-2 sprays per nostril at night for drainage. Advised patient to use Flonase sparingly due to her open angle glaucoma which is stable.

## 2022-06-11 NOTE — Assessment & Plan Note (Signed)
Continue with omeprazole '20mg'$  in the morning.

## 2022-06-11 NOTE — Assessment & Plan Note (Signed)
Doing well with below regimen. Used albuterol a few times. No prednisone. Follows with pulmonology for pulmonary nodules. Doesn't want to switch regimen prior to her upcoming surgery. Today's spirometry was normal.  Make sure you take in your inhalers the day of your surgery. Daily controller medication(s): continue with Symbicort 133mg 2 puffs twice a day with spacer and rinse mouth afterwards. Continue Spiriva daily. Gave a sample of Spiriva Respimat 1.253m 2 puffs once a day. See if this is cheaper or works better. Continue montelukast '10mg'$  daily. Continue Xolair '300mg'$  every 4 weeks.  During respiratory infections/asthma flares: start Armonair Digihaler 11374m1 puff twice a day and rinse mouth after each use for 1-2 weeks until your breathing symptoms return to baseline.  May use albuterol rescue inhaler 2 puffs every 4 to 6 hours as needed for shortness of breath, chest tightness, coughing, and wheezing. May use albuterol rescue inhaler 2 puffs 5 to 15 minutes prior to strenuous physical activities. Monitor frequency of use.  Get spirometry at next visit. Discussed starting Trelegy/Breztri but patient wants to wait until next visit.

## 2022-06-11 NOTE — Patient Instructions (Addendum)
Asthma: Make sure you take in your inhalers the day of your surgery.  Daily controller medication(s): continue with Symbicort 171mg 2 puffs twice a day with spacer and rinse mouth afterwards. Continue Spiriva daily. Giving you a sample of the Spiriva Respimat 1.262m 2 puffs once a day. See if this is cheaper or works better. Continue montelukast '10mg'$  daily. Continue Xolair '300mg'$  every 4 weeks.  During respiratory infections/asthma flares: start Armonair Digihaler 11344m1 puff twice a day and rinse mouth after each use for 1-2 weeks until your breathing symptoms return to baseline.  May use albuterol rescue inhaler 2 puffs every 4 to 6 hours as needed for shortness of breath, chest tightness, coughing, and wheezing. May use albuterol rescue inhaler 2 puffs 5 to 15 minutes prior to strenuous physical activities. Monitor frequency of use.  Asthma control goals:  Full participation in all desired activities (may need albuterol before activity) Albuterol use two times or less a week on average (not counting use with activity) Cough interfering with sleep two times or less a month Oral steroids no more than once a year No hospitalizations  Allergic rhinitis Continue environmental control measures (weed, molds) Continue allergy injections weekly. Continue montelukast '10mg'$  daily and allegra '180mg'$  daily.  May use azelastine 1-2 sprays per nostril at night for drainage.  Reflux Continue with omeprazole '20mg'$  in the morning.  Follow up in 4 months or sooner if needed.   Reducing Pollen Exposure Pollen seasons: trees (spring), grass (summer) and ragweed/weeds (fall). Keep windows closed in your home and car to lower pollen exposure.  Install air conditioning in the bedroom and throughout the house if possible.  Avoid going out in dry windy days - especially early morning. Pollen counts are highest between 5 - 10 AM and on dry, hot and windy days.  Save outside activities for late afternoon or  after a heavy rain, when pollen levels are lower.  Avoid mowing of grass if you have grass pollen allergy. Be aware that pollen can also be transported indoors on people and pets.  Dry your clothes in an automatic dryer rather than hanging them outside where they might collect pollen.  Rinse hair and eyes before bedtime. Mold Control Mold and fungi can grow on a variety of surfaces provided certain temperature and moisture conditions exist.  Outdoor molds grow on plants, decaying vegetation and soil. The major outdoor mold, Alternaria and Cladosporium, are found in very high numbers during hot and dry conditions. Generally, a late summer - fall peak is seen for common outdoor fungal spores. Rain will temporarily lower outdoor mold spore count, but counts rise rapidly when the rainy period ends. The most important indoor molds are Aspergillus and Penicillium. Dark, humid and poorly ventilated basements are ideal sites for mold growth. The next most common sites of mold growth are the bathroom and the kitchen. Outdoor (Seasonal) Mold Control Use air conditioning and keep windows closed. Avoid exposure to decaying vegetation. Avoid leaf raking. Avoid grain handling. Consider wearing a face mask if working in moldy areas.  Indoor (Perennial) Mold Control  Maintain humidity below 50%. Get rid of mold growth on hard surfaces with water, detergent and, if necessary, 5% bleach (do not mix with other cleaners). Then dry the area completely. If mold covers an area more than 10 square feet, consider hiring an indoor environmental professional. For clothing, washing with soap and water is best. If moldy items cannot be cleaned and dried, throw them away. Remove sources e.g. contaminated  carpets. Repair and seal leaking roofs or pipes. Using dehumidifiers in damp basements may be helpful, but empty the water and clean units regularly to prevent mildew from forming. All rooms, especially basements, bathrooms  and kitchens, require ventilation and cleaning to deter mold and mildew growth. Avoid carpeting on concrete or damp floors, and storing items in damp areas.

## 2022-06-16 ENCOUNTER — Ambulatory Visit (INDEPENDENT_AMBULATORY_CARE_PROVIDER_SITE_OTHER): Payer: Medicare Other

## 2022-06-16 DIAGNOSIS — J309 Allergic rhinitis, unspecified: Secondary | ICD-10-CM

## 2022-06-18 ENCOUNTER — Ambulatory Visit: Payer: Medicare Other

## 2022-06-20 ENCOUNTER — Ambulatory Visit (INDEPENDENT_AMBULATORY_CARE_PROVIDER_SITE_OTHER): Payer: Medicare Other | Admitting: *Deleted

## 2022-06-20 DIAGNOSIS — J4550 Severe persistent asthma, uncomplicated: Secondary | ICD-10-CM

## 2022-06-23 ENCOUNTER — Ambulatory Visit (INDEPENDENT_AMBULATORY_CARE_PROVIDER_SITE_OTHER): Payer: Medicare Other | Admitting: *Deleted

## 2022-06-23 DIAGNOSIS — J309 Allergic rhinitis, unspecified: Secondary | ICD-10-CM | POA: Diagnosis not present

## 2022-06-30 ENCOUNTER — Ambulatory Visit (INDEPENDENT_AMBULATORY_CARE_PROVIDER_SITE_OTHER): Payer: Medicare Other | Admitting: *Deleted

## 2022-06-30 DIAGNOSIS — J309 Allergic rhinitis, unspecified: Secondary | ICD-10-CM | POA: Diagnosis not present

## 2022-07-07 ENCOUNTER — Ambulatory Visit (INDEPENDENT_AMBULATORY_CARE_PROVIDER_SITE_OTHER): Payer: Medicare Other | Admitting: *Deleted

## 2022-07-07 DIAGNOSIS — J309 Allergic rhinitis, unspecified: Secondary | ICD-10-CM

## 2022-07-14 ENCOUNTER — Ambulatory Visit (INDEPENDENT_AMBULATORY_CARE_PROVIDER_SITE_OTHER): Payer: Medicare Other | Admitting: *Deleted

## 2022-07-14 DIAGNOSIS — J309 Allergic rhinitis, unspecified: Secondary | ICD-10-CM

## 2022-07-16 ENCOUNTER — Ambulatory Visit (INDEPENDENT_AMBULATORY_CARE_PROVIDER_SITE_OTHER): Payer: Medicare Other

## 2022-07-16 DIAGNOSIS — J4550 Severe persistent asthma, uncomplicated: Secondary | ICD-10-CM

## 2022-07-18 ENCOUNTER — Ambulatory Visit: Payer: Medicare Other

## 2022-07-22 ENCOUNTER — Ambulatory Visit (INDEPENDENT_AMBULATORY_CARE_PROVIDER_SITE_OTHER): Payer: Medicare Other | Admitting: *Deleted

## 2022-07-22 DIAGNOSIS — J309 Allergic rhinitis, unspecified: Secondary | ICD-10-CM

## 2022-08-04 ENCOUNTER — Ambulatory Visit (INDEPENDENT_AMBULATORY_CARE_PROVIDER_SITE_OTHER): Payer: Medicare Other

## 2022-08-04 DIAGNOSIS — J309 Allergic rhinitis, unspecified: Secondary | ICD-10-CM | POA: Diagnosis not present

## 2022-08-11 ENCOUNTER — Ambulatory Visit (INDEPENDENT_AMBULATORY_CARE_PROVIDER_SITE_OTHER): Payer: Medicare Other

## 2022-08-11 DIAGNOSIS — J309 Allergic rhinitis, unspecified: Secondary | ICD-10-CM | POA: Diagnosis not present

## 2022-08-13 ENCOUNTER — Ambulatory Visit (INDEPENDENT_AMBULATORY_CARE_PROVIDER_SITE_OTHER): Payer: Medicare Other | Admitting: *Deleted

## 2022-08-13 DIAGNOSIS — J4550 Severe persistent asthma, uncomplicated: Secondary | ICD-10-CM

## 2022-08-18 ENCOUNTER — Ambulatory Visit (INDEPENDENT_AMBULATORY_CARE_PROVIDER_SITE_OTHER): Payer: Medicare Other | Admitting: *Deleted

## 2022-08-18 DIAGNOSIS — J309 Allergic rhinitis, unspecified: Secondary | ICD-10-CM | POA: Diagnosis not present

## 2022-08-25 ENCOUNTER — Ambulatory Visit (INDEPENDENT_AMBULATORY_CARE_PROVIDER_SITE_OTHER): Payer: Medicare Other | Admitting: *Deleted

## 2022-08-25 DIAGNOSIS — J309 Allergic rhinitis, unspecified: Secondary | ICD-10-CM

## 2022-08-26 DIAGNOSIS — J302 Other seasonal allergic rhinitis: Secondary | ICD-10-CM

## 2022-08-26 NOTE — Progress Notes (Signed)
VIAL EXP 08-26-23 

## 2022-09-01 ENCOUNTER — Ambulatory Visit (INDEPENDENT_AMBULATORY_CARE_PROVIDER_SITE_OTHER): Payer: Medicare Other

## 2022-09-01 DIAGNOSIS — J309 Allergic rhinitis, unspecified: Secondary | ICD-10-CM

## 2022-09-10 ENCOUNTER — Ambulatory Visit (INDEPENDENT_AMBULATORY_CARE_PROVIDER_SITE_OTHER): Payer: Medicare Other | Admitting: *Deleted

## 2022-09-10 DIAGNOSIS — J4550 Severe persistent asthma, uncomplicated: Secondary | ICD-10-CM | POA: Diagnosis not present

## 2022-09-15 ENCOUNTER — Ambulatory Visit (INDEPENDENT_AMBULATORY_CARE_PROVIDER_SITE_OTHER): Payer: Medicare Other | Admitting: *Deleted

## 2022-09-15 DIAGNOSIS — J309 Allergic rhinitis, unspecified: Secondary | ICD-10-CM | POA: Diagnosis not present

## 2022-09-22 ENCOUNTER — Ambulatory Visit (INDEPENDENT_AMBULATORY_CARE_PROVIDER_SITE_OTHER): Payer: Medicare Other | Admitting: *Deleted

## 2022-09-22 DIAGNOSIS — J309 Allergic rhinitis, unspecified: Secondary | ICD-10-CM

## 2022-10-01 ENCOUNTER — Ambulatory Visit (INDEPENDENT_AMBULATORY_CARE_PROVIDER_SITE_OTHER): Payer: Medicare Other

## 2022-10-01 DIAGNOSIS — J309 Allergic rhinitis, unspecified: Secondary | ICD-10-CM

## 2022-10-06 ENCOUNTER — Ambulatory Visit (INDEPENDENT_AMBULATORY_CARE_PROVIDER_SITE_OTHER): Payer: Medicare Other

## 2022-10-06 DIAGNOSIS — J309 Allergic rhinitis, unspecified: Secondary | ICD-10-CM

## 2022-10-08 ENCOUNTER — Ambulatory Visit (INDEPENDENT_AMBULATORY_CARE_PROVIDER_SITE_OTHER): Payer: Medicare Other

## 2022-10-08 DIAGNOSIS — J4550 Severe persistent asthma, uncomplicated: Secondary | ICD-10-CM

## 2022-10-13 ENCOUNTER — Ambulatory Visit (INDEPENDENT_AMBULATORY_CARE_PROVIDER_SITE_OTHER): Payer: Medicare Other

## 2022-10-13 DIAGNOSIS — J309 Allergic rhinitis, unspecified: Secondary | ICD-10-CM | POA: Diagnosis not present

## 2022-10-15 ENCOUNTER — Ambulatory Visit: Payer: Medicare Other | Admitting: Allergy

## 2022-10-21 ENCOUNTER — Ambulatory Visit (INDEPENDENT_AMBULATORY_CARE_PROVIDER_SITE_OTHER): Payer: Medicare Other | Admitting: *Deleted

## 2022-10-21 DIAGNOSIS — J309 Allergic rhinitis, unspecified: Secondary | ICD-10-CM | POA: Diagnosis not present

## 2022-10-22 ENCOUNTER — Other Ambulatory Visit: Payer: Self-pay

## 2022-10-22 ENCOUNTER — Ambulatory Visit (INDEPENDENT_AMBULATORY_CARE_PROVIDER_SITE_OTHER): Payer: Medicare Other | Admitting: Allergy

## 2022-10-22 ENCOUNTER — Encounter: Payer: Self-pay | Admitting: Allergy

## 2022-10-22 VITALS — BP 140/68 | Temp 98.1°F | Ht 63.0 in | Wt 215.7 lb

## 2022-10-22 DIAGNOSIS — J4550 Severe persistent asthma, uncomplicated: Secondary | ICD-10-CM

## 2022-10-22 DIAGNOSIS — J3089 Other allergic rhinitis: Secondary | ICD-10-CM | POA: Diagnosis not present

## 2022-10-22 DIAGNOSIS — K219 Gastro-esophageal reflux disease without esophagitis: Secondary | ICD-10-CM | POA: Diagnosis not present

## 2022-10-22 NOTE — Assessment & Plan Note (Signed)
Past history - started AIT on W-M before 2016. No steroid nasal sprays due to glaucoma.  Interim history - doing well with weekly injections and does not want to change regimen as she notices increased rhinitis symptoms when she is late for her injections.   Continue environmental control measures (weed, molds) Continue allergy injections weekly. Continue montelukast 10mg  daily and allegra 180mg  daily.  May use azelastine 1-2 sprays per nostril at night for drainage.

## 2022-10-22 NOTE — Patient Instructions (Addendum)
Asthma: Daily controller medication(s): continue with Symbicort 2 puffs twice a day with spacer and rinse mouth afterwards. Continue Spiriva daily. Continue montelukast 10mg  daily. Continue Xolair 300mg  every 4 weeks.  May use albuterol rescue inhaler 2 puffs every 4 to 6 hours as needed for shortness of breath, chest tightness, coughing, and wheezing. May use albuterol rescue inhaler 2 puffs 5 to 15 minutes prior to strenuous physical activities. Monitor frequency of use.  Asthma control goals:  Full participation in all desired activities (may need albuterol before activity) Albuterol use two times or less a week on average (not counting use with activity) Cough interfering with sleep two times or less a month Oral steroids no more than once a year No hospitalizations  Allergic rhinitis Continue environmental control measures (weed, molds) Continue allergy injections weekly. Continue montelukast 10mg  daily and allegra 180mg  daily.  May use azelastine 1-2 sprays per nostril at night for drainage.  Reflux Continue with omeprazole 20mg  in the morning.  Follow up in 5 months or sooner if needed.   Reducing Pollen Exposure Pollen seasons: trees (spring), grass (summer) and ragweed/weeds (fall). Keep windows closed in your home and car to lower pollen exposure.  Install air conditioning in the bedroom and throughout the house if possible.  Avoid going out in dry windy days - especially early morning. Pollen counts are highest between 5 - 10 AM and on dry, hot and windy days.  Save outside activities for late afternoon or after a heavy rain, when pollen levels are lower.  Avoid mowing of grass if you have grass pollen allergy. Be aware that pollen can also be transported indoors on people and pets.  Dry your clothes in an automatic dryer rather than hanging them outside where they might collect pollen.  Rinse hair and eyes before bedtime. Mold Control Mold and fungi can grow on  a variety of surfaces provided certain temperature and moisture conditions exist.  Outdoor molds grow on plants, decaying vegetation and soil. The major outdoor mold, Alternaria and Cladosporium, are found in very high numbers during hot and dry conditions. Generally, a late summer - fall peak is seen for common outdoor fungal spores. Rain will temporarily lower outdoor mold spore count, but counts rise rapidly when the rainy period ends. The most important indoor molds are Aspergillus and Penicillium. Dark, humid and poorly ventilated basements are ideal sites for mold growth. The next most common sites of mold growth are the bathroom and the kitchen. Outdoor (Seasonal) Mold Control Use air conditioning and keep windows closed. Avoid exposure to decaying vegetation. Avoid leaf raking. Avoid grain handling. Consider wearing a face mask if working in moldy areas.  Indoor (Perennial) Mold Control  Maintain humidity below 50%. Get rid of mold growth on hard surfaces with water, detergent and, if necessary, 5% bleach (do not mix with other cleaners). Then dry the area completely. If mold covers an area more than 10 square feet, consider hiring an indoor environmental professional. For clothing, washing with soap and water is best. If moldy items cannot be cleaned and dried, throw them away. Remove sources e.g. contaminated carpets. Repair and seal leaking roofs or pipes. Using dehumidifiers in damp basements may be helpful, but empty the water and clean units regularly to prevent mildew from forming. All rooms, especially basements, bathrooms and kitchens, require ventilation and cleaning to deter mold and mildew growth. Avoid carpeting on concrete or damp floors, and storing items in damp areas.

## 2022-10-22 NOTE — Progress Notes (Signed)
Follow Up Note  RE: Melanie Armstrong MRN: [id number] DOB: 1956-03-03 Date of Office Visit: 10/22/2022  Referring provider: Noberto Retort, MD Primary care provider: Noberto Retort, MD  Chief Complaint: Follow-up (No concerns)  History of Present Illness: I had the pleasure of seeing Melanie Armstrong for a follow up visit at the [facility] on 10/22/2022. She is a 67 y.o. female, who is being followed for asthma, allergic rhinitis on AIT, GERD. Her previous allergy office visit was on 06/11/2022 with Dr. Selena Batten. Today is a regular follow up visit.  Severe persistent asthma Currently on Symbicort 2 puffs BID, Spiriva daily, Singulair 10mg  daily. Xolair 300mg  every 4 weeks.  She can't recall if she tried the respimat. She doesn't want to change her inhalers at this time.   Denies any ER/urgent care visits or prednisone use since the last visit.  Used albuterol once per week with good benefit.  Allergic rhinitis Currently on weekly injections and notices nasal congestion and PND if she is late. This happened when she was a few weeks late for her shot due to her shoulder surgery.  Using azelastine nasal sprays as needed with good benefit.  Open glaucoma is being followed by ophthalmology which is stable.  Not using Flonase.    Gastroesophageal reflux disease Controlled on omeprazole daily. Symptomatic if misses a dose.  Patient had shoulder surgery in April and doing PT for this.  Assessment and Plan: Melanie Armstrong is a 68 y.o. female with: Severe persistent asthma, uncomplicated Past history - follows with pulmonology for pulmonary nodules.  Interim history - not sure if she tried the respimat. Doesn't want to switch inhalers right now and content with current regimen. Discussed Breztri and Trelegy. Today's spirometry was normal.  Daily controller medication(s): continue with Symbicort 2 puffs twice a day with spacer and rinse mouth afterwards. Continue  Spiriva daily. Continue montelukast 10mg  daily. Continue Xolair 300mg  every 4 weeks.  May use albuterol rescue inhaler 2 puffs every 4 to 6 hours as needed for shortness of breath, chest tightness, coughing, and wheezing. May use albuterol rescue inhaler 2 puffs 5 to 15 minutes prior to strenuous physical activities. Monitor frequency of use.  Get spirometry at next visit.  Allergic rhinitis Past history - started AIT on W-M before 2016. No steroid nasal sprays due to glaucoma.  Interim history - doing well with weekly injections and does not want to change regimen as she notices increased rhinitis symptoms when she is late for her injections.   Continue environmental control measures (weed, molds) Continue allergy injections weekly. Continue montelukast 10mg  daily and allegra 180mg  daily.  May use azelastine 1-2 sprays per nostril at night for drainage.  Gastroesophageal reflux disease Symptomatic if misses a dose.  Continue with omeprazole 20mg  in the morning.  Return in about 5 months (around 03/24/2023).  No orders of the defined types were placed in this encounter.  Lab Orders  No laboratory test(s) ordered today    Diagnostics: Spirometry:  Tracings reviewed. Her effort: Good reproducible efforts. FVC: 2.66L FEV1: 2.38L, 108% predicted FEV1/FVC ratio: 89% Interpretation: Spirometry consistent with normal pattern.  Please see scanned spirometry results for details.  Medication List:  Current Outpatient Medications  Medication Sig Dispense Refill   albuterol (PROAIR HFA) 108 (90 Base) MCG/ACT inhaler Inhale 2 puffs into the lungs every 4 (four) hours as needed. 18 g 1   albuterol (PROVENTIL) (2.5 MG/3ML) 0.083% nebulizer solution USE 1 VIAL IN  NEBULIZER EVERY 4 HOURS AS NEEDED FOR WHEEZING OR  SHORTNESS  OF  BREATH 600 mL 0   azelastine (ASTELIN) 0.1 % nasal spray Place into both nostrils.     betaxolol (BETOPTIC-S) 0.5 % ophthalmic suspension 1 drop 2 (two) times daily.      budesonide-formoterol (SYMBICORT) 160-4.5 MCG/ACT inhaler Inhale 2 puffs into the lungs in the morning and at bedtime. with spacer and rinse mouth afterwards. 1 each 11   calcium-vitamin D (OSCAL WITH D) 500-200 MG-UNIT per tablet Take 1 tablet by mouth daily with breakfast.     diazepam (VALIUM) 5 MG tablet Take 5 mg by mouth every 6 (six) hours as needed for anxiety.     dorzolamide (TRUSOPT) 2 % ophthalmic solution      EPINEPHrine 0.3 mg/0.3 mL IJ SOAJ injection Inject 0.3 mg into the muscle as needed for anaphylaxis. 2 each 1   Ferrous Sulfate (IRON) 325 (65 Fe) MG TABS      fexofenadine (ALLEGRA) 180 MG tablet Take 180 mg by mouth daily.     furosemide (LASIX) 20 MG tablet Take 20 mg by mouth every morning.     glucosamine-chondroitin 500-400 MG tablet Take 1 tablet by mouth 2 (two) times daily.     latanoprost (XALATAN) 0.005 % ophthalmic solution 1 drop at bedtime.     lisinopril (ZESTRIL) 20 MG tablet Take 40 mg by mouth daily.     montelukast (SINGULAIR) 10 MG tablet Take 1 tablet (10 mg total) by mouth at bedtime. 30 tablet 5   Multiple Vitamin (MULTIVITAMIN WITH MINERALS) TABS tablet Take 1 tablet by mouth daily.     Omega-3 Fatty Acids (OMEGA 3 PO) Take 1 capsule by mouth daily.     omeprazole (PRILOSEC) 20 MG capsule Take 1 capsule (20 mg total) by mouth daily. 30 capsule 5   PRESCRIPTION MEDICATION once a week.     ROCKLATAN 0.02-0.005 % SOLN Apply 1 drop to eye at bedtime.     vitamin C (ASCORBIC ACID) 500 MG tablet Take 500 mg by mouth daily.     Current Facility-Administered Medications  Medication Dose Route Frequency Provider Last Rate Last Admin   omalizumab Geoffry Paradise) injection 300 mg  300 mg Subcutaneous Q28 days Jessica Priest, MD   300 mg at 10/08/22 1000   Allergies: Allergies  Allergen Reactions   Advair Diskus [Fluticasone-Salmeterol]     Per patient, she thinks she developed walking PNA.    Avelox [Moxifloxacin Hcl In Nacl] Other (See Comments)    Muscle  Aches   Irbesartan Other (See Comments)   Levofloxacin Other (See Comments)    Tendon pain   Olmesartan Other (See Comments)   Other Other (See Comments)   Protonix [Pantoprazole Sodium] Diarrhea   I reviewed her past medical history, social history, family history, and environmental history and no significant changes have been reported from her previous visit.  Review of Systems  Constitutional:  Negative for appetite change, chills, fever and unexpected weight change.  HENT:  Positive for congestion and rhinorrhea.   Eyes:  Negative for itching.  Respiratory:  Negative for cough, chest tightness, shortness of breath and wheezing.   Gastrointestinal:  Negative for abdominal pain.  Musculoskeletal:  Positive for back pain.  Skin:  Negative for rash.  Allergic/Immunologic: Positive for environmental allergies.  Neurological:  Negative for headaches.    Objective: BP (!) 140/68   Temp 98.1 F (36.7 C)   Ht 5\' 3"  (1.6 m)   Wt 215  lb 11.2 oz (97.8 kg)   SpO2 94%   BMI 38.21 kg/m  Body mass index is 38.21 kg/m. Physical Exam Vitals and nursing note reviewed.  Constitutional:      Appearance: Normal appearance. She is well-developed.  HENT:     Head: Normocephalic and atraumatic.     Right Ear: Tympanic membrane and external ear normal.     Left Ear: Tympanic membrane and external ear normal.     Nose: Nose normal. No congestion or rhinorrhea.     Mouth/Throat:     Mouth: Mucous membranes are moist.     Pharynx: Oropharynx is clear.  Eyes:     Conjunctiva/sclera: Conjunctivae normal.  Cardiovascular:     Rate and Rhythm: Normal rate and regular rhythm.     Heart sounds: Normal heart sounds. No murmur heard. Pulmonary:     Effort: Pulmonary effort is normal.     Breath sounds: Normal breath sounds. No wheezing, rhonchi or rales.  Musculoskeletal:     Cervical back: Neck supple.  Skin:    General: Skin is warm.     Findings: No rash.  Neurological:     Mental  Status: She is alert and oriented to person, place, and time.  Psychiatric:        Behavior: Behavior normal.    Previous notes and tests were reviewed. The plan was reviewed with the patient/family, and all questions/concerned were addressed.  It was my pleasure to see Melanie Armstrong today and participate in her care. Please feel free to contact me with any questions or concerns.  Sincerely,  Wyline Mood, DO Allergy & Immunology  [facility] office: [phone] [facility] [place] office: [phone]

## 2022-10-22 NOTE — Assessment & Plan Note (Signed)
Symptomatic if misses a dose.  Continue with omeprazole 20mg  in the morning.

## 2022-10-22 NOTE — Assessment & Plan Note (Signed)
Past history - follows with pulmonology for pulmonary nodules.  Interim history - not sure if she tried the respimat. Doesn't want to switch inhalers right now and content with current regimen. Discussed Breztri and Trelegy. Today's spirometry was normal.  Daily controller medication(s): continue with Symbicort 2 puffs twice a day with spacer and rinse mouth afterwards. Continue Spiriva daily. Continue montelukast 10mg  daily. Continue Xolair 300mg  every 4 weeks.  May use albuterol rescue inhaler 2 puffs every 4 to 6 hours as needed for shortness of breath, chest tightness, coughing, and wheezing. May use albuterol rescue inhaler 2 puffs 5 to 15 minutes prior to strenuous physical activities. Monitor frequency of use.  Get spirometry at next visit.

## 2022-10-27 ENCOUNTER — Ambulatory Visit (INDEPENDENT_AMBULATORY_CARE_PROVIDER_SITE_OTHER): Payer: Medicare Other | Admitting: *Deleted

## 2022-10-27 DIAGNOSIS — J309 Allergic rhinitis, unspecified: Secondary | ICD-10-CM

## 2022-11-03 ENCOUNTER — Ambulatory Visit (INDEPENDENT_AMBULATORY_CARE_PROVIDER_SITE_OTHER): Payer: Medicare Other

## 2022-11-03 DIAGNOSIS — J309 Allergic rhinitis, unspecified: Secondary | ICD-10-CM | POA: Diagnosis not present

## 2022-11-05 ENCOUNTER — Ambulatory Visit: Payer: Medicare Other

## 2022-11-10 ENCOUNTER — Ambulatory Visit (INDEPENDENT_AMBULATORY_CARE_PROVIDER_SITE_OTHER): Payer: Medicare Other

## 2022-11-10 DIAGNOSIS — J309 Allergic rhinitis, unspecified: Secondary | ICD-10-CM

## 2022-11-12 ENCOUNTER — Ambulatory Visit (INDEPENDENT_AMBULATORY_CARE_PROVIDER_SITE_OTHER): Payer: Medicare Other

## 2022-11-12 DIAGNOSIS — J4550 Severe persistent asthma, uncomplicated: Secondary | ICD-10-CM

## 2022-11-18 ENCOUNTER — Ambulatory Visit (INDEPENDENT_AMBULATORY_CARE_PROVIDER_SITE_OTHER): Payer: Medicare Other

## 2022-11-18 DIAGNOSIS — J309 Allergic rhinitis, unspecified: Secondary | ICD-10-CM | POA: Diagnosis not present

## 2022-11-24 ENCOUNTER — Ambulatory Visit (INDEPENDENT_AMBULATORY_CARE_PROVIDER_SITE_OTHER): Payer: Medicare Other

## 2022-11-24 DIAGNOSIS — J309 Allergic rhinitis, unspecified: Secondary | ICD-10-CM | POA: Diagnosis not present

## 2022-11-27 DIAGNOSIS — J302 Other seasonal allergic rhinitis: Secondary | ICD-10-CM | POA: Diagnosis not present

## 2022-11-27 NOTE — Progress Notes (Signed)
VIAL EXP 11-27-23

## 2022-12-01 ENCOUNTER — Ambulatory Visit (INDEPENDENT_AMBULATORY_CARE_PROVIDER_SITE_OTHER): Payer: Medicare Other

## 2022-12-01 DIAGNOSIS — J309 Allergic rhinitis, unspecified: Secondary | ICD-10-CM

## 2022-12-08 ENCOUNTER — Ambulatory Visit (INDEPENDENT_AMBULATORY_CARE_PROVIDER_SITE_OTHER): Payer: Medicare Other

## 2022-12-08 DIAGNOSIS — J309 Allergic rhinitis, unspecified: Secondary | ICD-10-CM | POA: Diagnosis not present

## 2022-12-10 ENCOUNTER — Ambulatory Visit (INDEPENDENT_AMBULATORY_CARE_PROVIDER_SITE_OTHER): Payer: Medicare Other | Admitting: *Deleted

## 2022-12-10 DIAGNOSIS — J4550 Severe persistent asthma, uncomplicated: Secondary | ICD-10-CM | POA: Diagnosis not present

## 2022-12-17 ENCOUNTER — Ambulatory Visit (INDEPENDENT_AMBULATORY_CARE_PROVIDER_SITE_OTHER): Payer: Medicare Other | Admitting: *Deleted

## 2022-12-17 DIAGNOSIS — J309 Allergic rhinitis, unspecified: Secondary | ICD-10-CM | POA: Diagnosis not present

## 2022-12-24 ENCOUNTER — Ambulatory Visit (INDEPENDENT_AMBULATORY_CARE_PROVIDER_SITE_OTHER): Payer: Medicare Other | Admitting: *Deleted

## 2022-12-24 DIAGNOSIS — J309 Allergic rhinitis, unspecified: Secondary | ICD-10-CM | POA: Diagnosis not present

## 2022-12-30 ENCOUNTER — Ambulatory Visit (INDEPENDENT_AMBULATORY_CARE_PROVIDER_SITE_OTHER): Payer: Medicare Other | Admitting: *Deleted

## 2022-12-30 DIAGNOSIS — J309 Allergic rhinitis, unspecified: Secondary | ICD-10-CM

## 2023-01-05 ENCOUNTER — Ambulatory Visit (INDEPENDENT_AMBULATORY_CARE_PROVIDER_SITE_OTHER): Payer: Medicare Other

## 2023-01-05 DIAGNOSIS — J309 Allergic rhinitis, unspecified: Secondary | ICD-10-CM

## 2023-01-07 ENCOUNTER — Ambulatory Visit (INDEPENDENT_AMBULATORY_CARE_PROVIDER_SITE_OTHER): Payer: Medicare Other

## 2023-01-07 DIAGNOSIS — J4550 Severe persistent asthma, uncomplicated: Secondary | ICD-10-CM | POA: Diagnosis not present

## 2023-01-12 ENCOUNTER — Ambulatory Visit (INDEPENDENT_AMBULATORY_CARE_PROVIDER_SITE_OTHER): Payer: Medicare Other

## 2023-01-12 DIAGNOSIS — J309 Allergic rhinitis, unspecified: Secondary | ICD-10-CM | POA: Diagnosis not present

## 2023-01-20 ENCOUNTER — Ambulatory Visit (INDEPENDENT_AMBULATORY_CARE_PROVIDER_SITE_OTHER): Payer: Medicare Other | Admitting: *Deleted

## 2023-01-20 DIAGNOSIS — J309 Allergic rhinitis, unspecified: Secondary | ICD-10-CM

## 2023-01-27 ENCOUNTER — Ambulatory Visit (INDEPENDENT_AMBULATORY_CARE_PROVIDER_SITE_OTHER): Payer: Self-pay | Admitting: *Deleted

## 2023-01-27 DIAGNOSIS — J309 Allergic rhinitis, unspecified: Secondary | ICD-10-CM | POA: Diagnosis not present

## 2023-01-30 DIAGNOSIS — H409 Unspecified glaucoma: Secondary | ICD-10-CM | POA: Insufficient documentation

## 2023-02-02 ENCOUNTER — Ambulatory Visit (INDEPENDENT_AMBULATORY_CARE_PROVIDER_SITE_OTHER): Payer: Medicare Other

## 2023-02-02 DIAGNOSIS — J309 Allergic rhinitis, unspecified: Secondary | ICD-10-CM | POA: Diagnosis not present

## 2023-02-04 ENCOUNTER — Ambulatory Visit: Payer: Medicare Other | Admitting: *Deleted

## 2023-02-04 DIAGNOSIS — J4550 Severe persistent asthma, uncomplicated: Secondary | ICD-10-CM | POA: Diagnosis not present

## 2023-02-05 DIAGNOSIS — J302 Other seasonal allergic rhinitis: Secondary | ICD-10-CM | POA: Diagnosis not present

## 2023-02-05 NOTE — Progress Notes (Signed)
VIAL EXP 02-05-24

## 2023-02-10 ENCOUNTER — Ambulatory Visit (INDEPENDENT_AMBULATORY_CARE_PROVIDER_SITE_OTHER): Payer: Self-pay | Admitting: *Deleted

## 2023-02-10 DIAGNOSIS — J309 Allergic rhinitis, unspecified: Secondary | ICD-10-CM | POA: Diagnosis not present

## 2023-02-17 ENCOUNTER — Ambulatory Visit (INDEPENDENT_AMBULATORY_CARE_PROVIDER_SITE_OTHER): Payer: Self-pay | Admitting: *Deleted

## 2023-02-17 DIAGNOSIS — J309 Allergic rhinitis, unspecified: Secondary | ICD-10-CM

## 2023-02-24 ENCOUNTER — Ambulatory Visit (INDEPENDENT_AMBULATORY_CARE_PROVIDER_SITE_OTHER): Payer: Medicare Other | Admitting: *Deleted

## 2023-02-24 DIAGNOSIS — J309 Allergic rhinitis, unspecified: Secondary | ICD-10-CM

## 2023-03-02 ENCOUNTER — Ambulatory Visit (INDEPENDENT_AMBULATORY_CARE_PROVIDER_SITE_OTHER): Payer: Medicare Other

## 2023-03-02 DIAGNOSIS — J309 Allergic rhinitis, unspecified: Secondary | ICD-10-CM | POA: Diagnosis not present

## 2023-03-04 ENCOUNTER — Ambulatory Visit: Payer: Medicare Other | Admitting: *Deleted

## 2023-03-04 DIAGNOSIS — J4550 Severe persistent asthma, uncomplicated: Secondary | ICD-10-CM | POA: Diagnosis not present

## 2023-03-09 ENCOUNTER — Ambulatory Visit (INDEPENDENT_AMBULATORY_CARE_PROVIDER_SITE_OTHER): Payer: Self-pay

## 2023-03-09 DIAGNOSIS — J309 Allergic rhinitis, unspecified: Secondary | ICD-10-CM

## 2023-03-17 ENCOUNTER — Ambulatory Visit (INDEPENDENT_AMBULATORY_CARE_PROVIDER_SITE_OTHER): Payer: Self-pay | Admitting: *Deleted

## 2023-03-17 DIAGNOSIS — J309 Allergic rhinitis, unspecified: Secondary | ICD-10-CM

## 2023-03-22 NOTE — Progress Notes (Unsigned)
Follow Up Note  RE: Melanie Armstrong MRN: [id number] DOB: 04-04-1956 Date of Office Visit: 03/23/2023  Referring provider: Noberto Retort, MD Primary care provider: Noberto Retort, MD  Chief Complaint: No chief complaint on file.  History of Present Illness: I had the pleasure of seeing Melanie Armstrong for a follow up visit at the [facility] on 03/22/2023. She is a 67 y.o. female, who is being followed for asthma on Xolair, allergic rhinitis on AIT, GERD. Her previous allergy office visit was on 10/22/2022 with Dr. Selena Batten. Today is a regular follow up visit.  Discussed the use of AI scribe software for clinical note transcription with the patient, who gave verbal consent to proceed.  History of Present Illness            Severe persistent asthma, uncomplicated Past history - follows with pulmonology for pulmonary nodules.  Interim history - not sure if she tried the respimat. Doesn't want to switch inhalers right now and content with current regimen. Discussed Breztri and Trelegy. Today's spirometry was normal.  Daily controller medication(s): continue with Symbicort 2 puffs twice a day with spacer and rinse mouth afterwards. Continue Spiriva daily. Continue montelukast 10mg  daily. Continue Xolair 300mg  every 4 weeks.  May use albuterol rescue inhaler 2 puffs every 4 to 6 hours as needed for shortness of breath, chest tightness, coughing, and wheezing. May use albuterol rescue inhaler 2 puffs 5 to 15 minutes prior to strenuous physical activities. Monitor frequency of use.  Get spirometry at next visit.   Allergic rhinitis Past history - started AIT on W-M before 2016. No steroid nasal sprays due to glaucoma.  Interim history - doing well with weekly injections and does not want to change regimen as she notices increased rhinitis symptoms when she is late for her injections.   Continue environmental control measures (weed, molds) Continue allergy  injections weekly. Continue montelukast 10mg  daily and allegra 180mg  daily.  May use azelastine 1-2 sprays per nostril at night for drainage.   Gastroesophageal reflux disease Symptomatic if misses a dose.  Continue with omeprazole 20mg  in the morning.  Assessment and Plan: Melanie Armstrong is a 67 y.o. female with: Severe persistent asthma, uncomplicated Past history - follows with pulmonology for pulmonary nodules.  Interim history - not sure if she tried the respimat. Doesn't want to switch inhalers right now and content with current regimen. Discussed Breztri and Trelegy. Today's spirometry was normal.  Daily controller medication(s): continue with Symbicort 2 puffs twice a day with spacer and rinse mouth afterwards. Continue Spiriva daily. Continue montelukast 10mg  daily. Continue Xolair 300mg  every 4 weeks.  May use albuterol rescue inhaler 2 puffs every 4 to 6 hours as needed for shortness of breath, chest tightness, coughing, and wheezing. May use albuterol rescue inhaler 2 puffs 5 to 15 minutes prior to strenuous physical activities. Monitor frequency of use.  Get spirometry at next visit.   Allergic rhinitis Past history - started AIT on W-M before 2016. No steroid nasal sprays due to glaucoma.  Interim history - doing well with weekly injections and does not want to change regimen as she notices increased rhinitis symptoms when she is late for her injections.   Continue environmental control measures (weed, molds) Continue allergy injections weekly. Continue montelukast 10mg  daily and allegra 180mg  daily.  May use azelastine 1-2 sprays per nostril at night for drainage.   Gastroesophageal reflux disease Symptomatic if misses a dose.  Continue with omeprazole  20mg  in the morning. Assessment and Plan              No follow-ups on file.  No orders of the defined types were placed in this encounter.  Lab Orders  No laboratory test(s) ordered today     Diagnostics: Spirometry:  Tracings reviewed. Her effort: {Blank single:19197::"Good reproducible efforts.","It was hard to get consistent efforts and there is a question as to whether this reflects a maximal maneuver.","Poor effort, data can not be interpreted."} FVC: ***L FEV1: ***L, ***% predicted FEV1/FVC ratio: ***% Interpretation: {Blank single:19197::"Spirometry consistent with mild obstructive disease","Spirometry consistent with moderate obstructive disease","Spirometry consistent with severe obstructive disease","Spirometry consistent with possible restrictive disease","Spirometry consistent with mixed obstructive and restrictive disease","Spirometry uninterpretable due to technique","Spirometry consistent with normal pattern","No overt abnormalities noted given today's efforts"}.  Please see scanned spirometry results for details.  Skin Testing: {Blank single:19197::"Select foods","Environmental allergy panel","Environmental allergy panel and select foods","Food allergy panel","None","Deferred due to recent antihistamines use"}. *** Results discussed with patient/family.   Medication List:  Current Outpatient Medications  Medication Sig Dispense Refill  . albuterol (PROAIR HFA) 108 (90 Base) MCG/ACT inhaler Inhale 2 puffs into the lungs every 4 (four) hours as needed. 18 g 1  . albuterol (PROVENTIL) (2.5 MG/3ML) 0.083% nebulizer solution USE 1 VIAL IN NEBULIZER EVERY 4 HOURS AS NEEDED FOR WHEEZING OR  SHORTNESS  OF  BREATH 600 mL 0  . azelastine (ASTELIN) 0.1 % nasal spray Place into both nostrils.    . betaxolol (BETOPTIC-S) 0.5 % ophthalmic suspension 1 drop 2 (two) times daily.    . budesonide-formoterol (SYMBICORT) 160-4.5 MCG/ACT inhaler Inhale 2 puffs into the lungs in the morning and at bedtime. with spacer and rinse mouth afterwards. 1 each 11  . calcium-vitamin D (OSCAL WITH D) 500-200 MG-UNIT per tablet Take 1 tablet by mouth daily with breakfast.    . diazepam  (VALIUM) 5 MG tablet Take 5 mg by mouth every 6 (six) hours as needed for anxiety.    . dorzolamide (TRUSOPT) 2 % ophthalmic solution     . EPINEPHrine 0.3 mg/0.3 mL IJ SOAJ injection Inject 0.3 mg into the muscle as needed for anaphylaxis. 2 each 1  . Ferrous Sulfate (IRON) 325 (65 Fe) MG TABS     . fexofenadine (ALLEGRA) 180 MG tablet Take 180 mg by mouth daily.    . furosemide (LASIX) 20 MG tablet Take 20 mg by mouth every morning.    Marland Kitchen glucosamine-chondroitin 500-400 MG tablet Take 1 tablet by mouth 2 (two) times daily.    Marland Kitchen latanoprost (XALATAN) 0.005 % ophthalmic solution 1 drop at bedtime.    Marland Kitchen lisinopril (ZESTRIL) 20 MG tablet Take 40 mg by mouth daily.    . montelukast (SINGULAIR) 10 MG tablet Take 1 tablet (10 mg total) by mouth at bedtime. 30 tablet 5  . Multiple Vitamin (MULTIVITAMIN WITH MINERALS) TABS tablet Take 1 tablet by mouth daily.    . Omega-3 Fatty Acids (OMEGA 3 PO) Take 1 capsule by mouth daily.    Marland Kitchen omeprazole (PRILOSEC) 20 MG capsule Take 1 capsule (20 mg total) by mouth daily. 30 capsule 5  . PRESCRIPTION MEDICATION once a week.    Marland Kitchen ROCKLATAN 0.02-0.005 % SOLN Apply 1 drop to eye at bedtime.    . vitamin C (ASCORBIC ACID) 500 MG tablet Take 500 mg by mouth daily.     Current Facility-Administered Medications  Medication Dose Route Frequency Provider Last Rate Last Admin  . omalizumab Geoffry Paradise) injection 300 mg  300 mg Subcutaneous Q28 days Jessica Priest, MD   300 mg at 03/04/23 1000   Allergies: Allergies  Allergen Reactions  . Advair Diskus [Fluticasone-Salmeterol]     Per patient, she thinks she developed walking PNA.   Marland Kitchen Avelox [Moxifloxacin Hcl In Nacl] Other (See Comments)    Muscle Aches  . Irbesartan Other (See Comments)  . Levofloxacin Other (See Comments)    Tendon pain  . Olmesartan Other (See Comments)  . Other Other (See Comments)  . Protonix [Pantoprazole Sodium] Diarrhea   I reviewed her past medical history, social history, family history,  and environmental history and no significant changes have been reported from her previous visit.  Review of Systems  Constitutional:  Negative for appetite change, chills, fever and unexpected weight change.  HENT:  Positive for congestion and rhinorrhea.   Eyes:  Negative for itching.  Respiratory:  Negative for cough, chest tightness, shortness of breath and wheezing.   Gastrointestinal:  Negative for abdominal pain.  Musculoskeletal:  Positive for back pain.  Skin:  Negative for rash.  Allergic/Immunologic: Positive for environmental allergies.  Neurological:  Negative for headaches.   Objective: There were no vitals taken for this visit. There is no height or weight on file to calculate BMI. Physical Exam Vitals and nursing note reviewed.  Constitutional:      Appearance: Normal appearance. She is well-developed.  HENT:     Head: Normocephalic and atraumatic.     Right Ear: Tympanic membrane and external ear normal.     Left Ear: Tympanic membrane and external ear normal.     Nose: Nose normal. No congestion or rhinorrhea.     Mouth/Throat:     Mouth: Mucous membranes are moist.     Pharynx: Oropharynx is clear.  Eyes:     Conjunctiva/sclera: Conjunctivae normal.  Cardiovascular:     Rate and Rhythm: Normal rate and regular rhythm.     Heart sounds: Normal heart sounds. No murmur heard. Pulmonary:     Effort: Pulmonary effort is normal.     Breath sounds: Normal breath sounds. No wheezing, rhonchi or rales.  Musculoskeletal:     Cervical back: Neck supple.  Skin:    General: Skin is warm.     Findings: No rash.  Neurological:     Mental Status: She is alert and oriented to person, place, and time.  Psychiatric:        Behavior: Behavior normal.  Previous notes and tests were reviewed. The plan was reviewed with the patient/family, and all questions/concerned were addressed.  It was my pleasure to see Melanie Armstrong today and participate in her care. Please feel free to  contact me with any questions or concerns.  Sincerely,  Wyline Mood, DO Allergy & Immunology  [facility] office: [phone] [facility] office: [phone]

## 2023-03-23 ENCOUNTER — Ambulatory Visit (INDEPENDENT_AMBULATORY_CARE_PROVIDER_SITE_OTHER): Payer: Medicare Other

## 2023-03-23 ENCOUNTER — Encounter: Payer: Self-pay | Admitting: Allergy

## 2023-03-23 ENCOUNTER — Other Ambulatory Visit: Payer: Self-pay

## 2023-03-23 ENCOUNTER — Ambulatory Visit (INDEPENDENT_AMBULATORY_CARE_PROVIDER_SITE_OTHER): Payer: Medicare Other | Admitting: Allergy

## 2023-03-23 VITALS — BP 136/64 | HR 74 | Temp 98.2°F | Wt 207.9 lb

## 2023-03-23 DIAGNOSIS — J4550 Severe persistent asthma, uncomplicated: Secondary | ICD-10-CM

## 2023-03-23 DIAGNOSIS — K219 Gastro-esophageal reflux disease without esophagitis: Secondary | ICD-10-CM

## 2023-03-23 DIAGNOSIS — J309 Allergic rhinitis, unspecified: Secondary | ICD-10-CM

## 2023-03-23 DIAGNOSIS — J3089 Other allergic rhinitis: Secondary | ICD-10-CM

## 2023-03-23 DIAGNOSIS — Z539 Procedure and treatment not carried out, unspecified reason: Secondary | ICD-10-CM

## 2023-03-23 MED ORDER — EPINEPHRINE 0.3 MG/0.3ML IJ SOAJ: 0.3000 mg | INTRAMUSCULAR | 1 refills | Status: DC | PRN

## 2023-03-23 NOTE — Patient Instructions (Addendum)
Asthma: Daily controller medication(s): continue with Symbicort 2 puffs twice a day with spacer and rinse mouth afterwards. Continue Spiriva once a day. Continue montelukast 10mg  daily. Continue Xolair 300mg  every 4 weeks.  May use albuterol rescue inhaler 2 puffs every 4 to 6 hours as needed for shortness of breath, chest tightness, coughing, and wheezing. May use albuterol rescue inhaler 2 puffs 5 to 15 minutes prior to strenuous physical activities. Monitor frequency of use - if you need to use it more than twice per week on a consistent basis let us know.  Asthma control goals:  Full participation in all desired activities (may need albuterol before activity) Albuterol use two times or less a week on average (not counting use with activity) Cough interfering with sleep two times or less a month Oral steroids no more than once a year No hospitalizations  Allergic rhinitis Continue environmental control measures (weed, molds) Continue allergy injections weekly - given.  You may come in every 1-4 weeks now on maintenance.  Continue montelukast 10mg  daily and allegra 180mg  daily.  Use azelastine nasal spray 1-2 sprays per nostril twice a day as needed for runny nose/drainage. Nasal saline spray (i.e., Simply Saline) or nasal saline lavage (i.e., NeilMed) is recommended as needed and prior to medicated nasal sprays.  Reflux Continue with omeprazole 20mg  in the morning.  Follow up in 6 months or sooner if needed.   Reducing Pollen Exposure Pollen seasons: trees (spring), grass (summer) and ragweed/weeds (fall). Keep windows closed in your home and car to lower pollen exposure.  Install air conditioning in the bedroom and throughout the house if possible.  Avoid going out in dry windy days - especially early morning. Pollen counts are highest between 5 - 10 AM and on dry, hot and windy days.  Save outside activities for late afternoon or after a heavy rain, when pollen levels are  lower.  Avoid mowing of grass if you have grass pollen allergy. Be aware that pollen can also be transported indoors on people and pets.  Dry your clothes in an automatic dryer rather than hanging them outside where they might collect pollen.  Rinse hair and eyes before bedtime. Mold Control Mold and fungi can grow on a variety of surfaces provided certain temperature and moisture conditions exist.  Outdoor molds grow on plants, decaying vegetation and soil. The major outdoor mold, Alternaria and Cladosporium, are found in very high numbers during hot and dry conditions. Generally, a late summer - fall peak is seen for common outdoor fungal spores. Rain will temporarily lower outdoor mold spore count, but counts rise rapidly when the rainy period ends. The most important indoor molds are Aspergillus and Penicillium. Dark, humid and poorly ventilated basements are ideal sites for mold growth. The next most common sites of mold growth are the bathroom and the kitchen. Outdoor (Seasonal) Mold Control Use air conditioning and keep windows closed. Avoid exposure to decaying vegetation. Avoid leaf raking. Avoid grain handling. Consider wearing a face mask if working in moldy areas.  Indoor (Perennial) Mold Control  Maintain humidity below 50%. Get rid of mold growth on hard surfaces with water, detergent and, if necessary, 5% bleach (do not mix with other cleaners). Then dry the area completely. If mold covers an area more than 10 square feet, consider hiring an indoor environmental professional. For clothing, washing with soap and water is best. If moldy items cannot be cleaned and dried, throw them away. Remove sources e.g. contaminated carpets. Repair and  seal leaking roofs or pipes. Using dehumidifiers in damp basements may be helpful, but empty the water and clean units regularly to prevent mildew from forming. All rooms, especially basements, bathrooms and kitchens, require ventilation and  cleaning to deter mold and mildew growth. Avoid carpeting on concrete or damp floors, and storing items in damp areas.

## 2023-03-30 ENCOUNTER — Ambulatory Visit (INDEPENDENT_AMBULATORY_CARE_PROVIDER_SITE_OTHER): Payer: Self-pay | Admitting: *Deleted

## 2023-03-30 DIAGNOSIS — J309 Allergic rhinitis, unspecified: Secondary | ICD-10-CM

## 2023-04-01 ENCOUNTER — Ambulatory Visit (INDEPENDENT_AMBULATORY_CARE_PROVIDER_SITE_OTHER): Payer: Medicare Other | Admitting: *Deleted

## 2023-04-01 DIAGNOSIS — J4550 Severe persistent asthma, uncomplicated: Secondary | ICD-10-CM | POA: Diagnosis not present

## 2023-04-06 ENCOUNTER — Ambulatory Visit (INDEPENDENT_AMBULATORY_CARE_PROVIDER_SITE_OTHER): Payer: Self-pay

## 2023-04-06 DIAGNOSIS — J309 Allergic rhinitis, unspecified: Secondary | ICD-10-CM | POA: Diagnosis not present

## 2023-04-19 ENCOUNTER — Other Ambulatory Visit: Payer: Self-pay

## 2023-04-19 ENCOUNTER — Emergency Department (HOSPITAL_COMMUNITY): Payer: Medicare Other

## 2023-04-19 ENCOUNTER — Inpatient Hospital Stay (HOSPITAL_COMMUNITY)
Admission: EM | Admit: 2023-04-19 | Discharge: 2023-04-30 | DRG: 871 | Disposition: A | Discharge status: Home Health | Payer: Medicare Other | Attending: Student | Admitting: Student

## 2023-04-19 ENCOUNTER — Inpatient Hospital Stay (HOSPITAL_COMMUNITY): Payer: Medicare Other

## 2023-04-19 DIAGNOSIS — Z79899 Other long term (current) drug therapy: Secondary | ICD-10-CM

## 2023-04-19 DIAGNOSIS — R0902 Hypoxemia: Secondary | ICD-10-CM

## 2023-04-19 DIAGNOSIS — F419 Anxiety disorder, unspecified: Secondary | ICD-10-CM | POA: Diagnosis present

## 2023-04-19 DIAGNOSIS — Z96643 Presence of artificial hip joint, bilateral: Secondary | ICD-10-CM | POA: Diagnosis present

## 2023-04-19 DIAGNOSIS — E669 Obesity, unspecified: Secondary | ICD-10-CM | POA: Diagnosis present

## 2023-04-19 DIAGNOSIS — Z7951 Long term (current) use of inhaled steroids: Secondary | ICD-10-CM

## 2023-04-19 DIAGNOSIS — Z881 Allergy status to other antibiotic agents status: Secondary | ICD-10-CM | POA: Diagnosis not present

## 2023-04-19 DIAGNOSIS — J9601 Acute respiratory failure with hypoxia: Secondary | ICD-10-CM | POA: Diagnosis present

## 2023-04-19 DIAGNOSIS — J1282 Pneumonia due to coronavirus disease 2019: Secondary | ICD-10-CM | POA: Diagnosis present

## 2023-04-19 DIAGNOSIS — Z8249 Family history of ischemic heart disease and other diseases of the circulatory system: Secondary | ICD-10-CM | POA: Diagnosis not present

## 2023-04-19 DIAGNOSIS — K219 Gastro-esophageal reflux disease without esophagitis: Secondary | ICD-10-CM | POA: Diagnosis present

## 2023-04-19 DIAGNOSIS — J45901 Unspecified asthma with (acute) exacerbation: Secondary | ICD-10-CM | POA: Diagnosis present

## 2023-04-19 DIAGNOSIS — Z888 Allergy status to other drugs, medicaments and biological substances status: Secondary | ICD-10-CM | POA: Diagnosis not present

## 2023-04-19 DIAGNOSIS — Z6836 Body mass index (BMI) 36.0-36.9, adult: Secondary | ICD-10-CM

## 2023-04-19 DIAGNOSIS — Z28310 Unvaccinated for covid-19: Secondary | ICD-10-CM | POA: Diagnosis not present

## 2023-04-19 DIAGNOSIS — R0602 Shortness of breath: Secondary | ICD-10-CM | POA: Diagnosis present

## 2023-04-19 DIAGNOSIS — A419 Sepsis, unspecified organism: Secondary | ICD-10-CM | POA: Diagnosis not present

## 2023-04-19 DIAGNOSIS — A4189 Other specified sepsis: Principal | ICD-10-CM | POA: Diagnosis present

## 2023-04-19 DIAGNOSIS — R6520 Severe sepsis without septic shock: Secondary | ICD-10-CM

## 2023-04-19 DIAGNOSIS — I10 Essential (primary) hypertension: Secondary | ICD-10-CM | POA: Diagnosis present

## 2023-04-19 DIAGNOSIS — M1990 Unspecified osteoarthritis, unspecified site: Secondary | ICD-10-CM | POA: Diagnosis present

## 2023-04-19 DIAGNOSIS — Z9049 Acquired absence of other specified parts of digestive tract: Secondary | ICD-10-CM

## 2023-04-19 DIAGNOSIS — E876 Hypokalemia: Secondary | ICD-10-CM | POA: Diagnosis present

## 2023-04-19 DIAGNOSIS — J4551 Severe persistent asthma with (acute) exacerbation: Secondary | ICD-10-CM | POA: Diagnosis present

## 2023-04-19 DIAGNOSIS — J189 Pneumonia, unspecified organism: Principal | ICD-10-CM

## 2023-04-19 DIAGNOSIS — U071 COVID-19: Secondary | ICD-10-CM | POA: Diagnosis present

## 2023-04-19 LAB — RESP PANEL BY RT-PCR (RSV, FLU A&B, COVID)  RVPGX2
Influenza A by PCR: NEGATIVE
Influenza B by PCR: NEGATIVE
Resp Syncytial Virus by PCR: NEGATIVE
SARS Coronavirus 2 by RT PCR: POSITIVE — AB

## 2023-04-19 LAB — CBC
HCT: 42.2 % (ref 36.0–46.0)
Hemoglobin: 14.1 g/dL (ref 12.0–15.0)
MCH: 31.5 pg (ref 26.0–34.0)
MCHC: 33.4 g/dL (ref 30.0–36.0)
MCV: 94.2 fL (ref 80.0–100.0)
Platelets: 232 10*3/uL (ref 150–400)
RBC: 4.48 MIL/uL (ref 3.87–5.11)
RDW: 12.3 % (ref 11.5–15.5)
WBC: 7.5 10*3/uL (ref 4.0–10.5)
nRBC: 0 % (ref 0.0–0.2)

## 2023-04-19 LAB — COMPREHENSIVE METABOLIC PANEL
ALT: 36 U/L (ref 0–44)
AST: 41 U/L (ref 15–41)
Albumin: 2.7 g/dL — ABNORMAL LOW (ref 3.5–5.0)
Alkaline Phosphatase: 63 U/L (ref 38–126)
Anion gap: 12 (ref 5–15)
BUN: 21 mg/dL (ref 8–23)
CO2: 26 mmol/L (ref 22–32)
Calcium: 9 mg/dL (ref 8.9–10.3)
Chloride: 100 mmol/L (ref 98–111)
Creatinine, Ser: 0.85 mg/dL (ref 0.44–1.00)
GFR, Estimated: >60 mL/min (ref >60)
Glucose, Bld: 121 mg/dL — ABNORMAL HIGH (ref 70–99)
Potassium: 2.9 mmol/L — ABNORMAL LOW (ref 3.5–5.1)
Sodium: 138 mmol/L (ref 135–145)
Total Bilirubin: 0.9 mg/dL (ref 0.0–1.2)
Total Protein: 5.7 g/dL — ABNORMAL LOW (ref 6.5–8.1)

## 2023-04-19 LAB — BRAIN NATRIURETIC PEPTIDE: B Natriuretic Peptide: 41.9 pg/mL (ref 0.0–100.0)

## 2023-04-19 LAB — I-STAT ARTERIAL BLOOD GAS, ED
Acid-Base Excess: 4 mmol/L — ABNORMAL HIGH (ref 0.0–2.0)
Bicarbonate: 27.7 mmol/L (ref 20.0–28.0)
Calcium, Ion: 1.22 mmol/L (ref 1.15–1.40)
HCT: 35 % — ABNORMAL LOW (ref 36.0–46.0)
Hemoglobin: 11.9 g/dL — ABNORMAL LOW (ref 12.0–15.0)
O2 Saturation: 93 %
Potassium: 2.8 mmol/L — ABNORMAL LOW (ref 3.5–5.1)
Sodium: 138 mmol/L (ref 135–145)
TCO2: 29 mmol/L (ref 22–32)
pCO2 arterial: 38.1 mm[Hg] (ref 32–48)
pH, Arterial: 7.47 — ABNORMAL HIGH (ref 7.35–7.45)
pO2, Arterial: 64 mm[Hg] — ABNORMAL LOW (ref 83–108)

## 2023-04-19 LAB — PHOSPHORUS: Phosphorus: 2.5 mg/dL (ref 2.5–4.6)

## 2023-04-19 LAB — D-DIMER, QUANTITATIVE: D-Dimer, Quant: 1.19 ug{FEU}/mL — ABNORMAL HIGH (ref 0.00–0.50)

## 2023-04-19 LAB — I-STAT CG4 LACTIC ACID, ED: Lactic Acid, Venous: 1.4 mmol/L (ref 0.5–1.9)

## 2023-04-19 LAB — MAGNESIUM: Magnesium: 2.2 mg/dL (ref 1.7–2.4)

## 2023-04-19 LAB — HIV ANTIBODY (ROUTINE TESTING W REFLEX): HIV Screen 4th Generation wRfx: NONREACTIVE

## 2023-04-19 LAB — MRSA NEXT GEN BY PCR, NASAL: MRSA by PCR Next Gen: NOT DETECTED

## 2023-04-19 LAB — PROCALCITONIN: Procalcitonin: <0.1 ng/mL

## 2023-04-19 MED ORDER — ALBUTEROL SULFATE (2.5 MG/3ML) 0.083% IN NEBU
2.5000 mg | INHALATION_SOLUTION | Freq: Four times a day (QID) | RESPIRATORY_TRACT | Status: DC
  Administered 2023-04-19 – 2023-04-21 (×7): 2.5 mg via RESPIRATORY_TRACT
  Filled 2023-04-19 (×7): qty 3

## 2023-04-19 MED ORDER — POTASSIUM CHLORIDE CRYS ER 20 MEQ PO TBCR
40.0000 meq | EXTENDED_RELEASE_TABLET | Freq: Once | ORAL | Status: AC
  Administered 2023-04-19: 40 meq via ORAL
  Filled 2023-04-19: qty 2

## 2023-04-19 MED ORDER — SODIUM CHLORIDE 0.9 % IV SOLN
100.0000 mg | Freq: Every day | INTRAVENOUS | Status: AC
  Administered 2023-04-20 – 2023-04-21 (×2): 100 mg via INTRAVENOUS
  Filled 2023-04-19 (×2): qty 20

## 2023-04-19 MED ORDER — UMECLIDINIUM BROMIDE 62.5 MCG/ACT IN AEPB
1.0000 | INHALATION_SPRAY | Freq: Every day | RESPIRATORY_TRACT | Status: DC
  Administered 2023-04-20 – 2023-04-30 (×11): 1 via RESPIRATORY_TRACT
  Filled 2023-04-19 (×2): qty 7

## 2023-04-19 MED ORDER — GUAIFENESIN ER 600 MG PO TB12
600.0000 mg | ORAL_TABLET | Freq: Two times a day (BID) | ORAL | Status: DC
  Administered 2023-04-19 – 2023-04-30 (×23): 600 mg via ORAL
  Filled 2023-04-19 (×23): qty 1

## 2023-04-19 MED ORDER — K PHOS MONO-SOD PHOS DI & MONO 155-852-130 MG PO TABS
500.0000 mg | ORAL_TABLET | Freq: Once | ORAL | Status: AC
  Administered 2023-04-19: 500 mg via ORAL
  Filled 2023-04-19 (×3): qty 2

## 2023-04-19 MED ORDER — ACETAMINOPHEN 325 MG PO TABS
ORAL_TABLET | ORAL | Status: AC
  Filled 2023-04-19: qty 2

## 2023-04-19 MED ORDER — PREDNISONE 20 MG PO TABS: 50.0000 mg | ORAL_TABLET | Freq: Every day | ORAL | Status: DC

## 2023-04-19 MED ORDER — POTASSIUM CHLORIDE CRYS ER 20 MEQ PO TBCR
20.0000 meq | EXTENDED_RELEASE_TABLET | ORAL | Status: AC
  Administered 2023-04-19: 20 meq via ORAL
  Filled 2023-04-19: qty 1

## 2023-04-19 MED ORDER — LATANOPROST 0.005 % OP SOLN
1.0000 [drp] | Freq: Every day | OPHTHALMIC | Status: DC
  Administered 2023-04-19 – 2023-04-29 (×11): 1 [drp] via OPHTHALMIC
  Filled 2023-04-19: qty 2.5

## 2023-04-19 MED ORDER — CEFTRIAXONE SODIUM 1 G IJ SOLR
1.0000 g | Freq: Once | INTRAMUSCULAR | Status: AC
  Administered 2023-04-19: 1 g via INTRAVENOUS
  Filled 2023-04-19: qty 10

## 2023-04-19 MED ORDER — AZITHROMYCIN 500 MG PO TABS
500.0000 mg | ORAL_TABLET | Freq: Every day | ORAL | Status: AC
  Administered 2023-04-20 – 2023-04-23 (×4): 500 mg via ORAL
  Filled 2023-04-19 (×4): qty 1

## 2023-04-19 MED ORDER — SODIUM CHLORIDE 0.9% FLUSH
3.0000 mL | Freq: Two times a day (BID) | INTRAVENOUS | Status: DC
  Administered 2023-04-19 – 2023-04-30 (×22): 3 mL via INTRAVENOUS

## 2023-04-19 MED ORDER — METHYLPREDNISOLONE SODIUM SUCC 125 MG IJ SOLR
100.0000 mg | Freq: Two times a day (BID) | INTRAMUSCULAR | Status: AC
  Administered 2023-04-19 – 2023-04-21 (×6): 100 mg via INTRAVENOUS
  Filled 2023-04-19 (×6): qty 2

## 2023-04-19 MED ORDER — ARFORMOTEROL TARTRATE 15 MCG/2ML IN NEBU
15.0000 ug | INHALATION_SOLUTION | Freq: Two times a day (BID) | RESPIRATORY_TRACT | Status: DC
  Administered 2023-04-19 – 2023-04-30 (×23): 15 ug via RESPIRATORY_TRACT
  Filled 2023-04-19 (×23): qty 2

## 2023-04-19 MED ORDER — DORZOLAMIDE HCL 2 % OP SOLN
1.0000 [drp] | Freq: Two times a day (BID) | OPHTHALMIC | Status: DC
  Administered 2023-04-19 – 2023-04-30 (×22): 1 [drp] via OPHTHALMIC
  Filled 2023-04-19: qty 10

## 2023-04-19 MED ORDER — TIOTROPIUM BROMIDE MONOHYDRATE 18 MCG IN CAPS: 18.0000 ug | ORAL_CAPSULE | Freq: Every day | RESPIRATORY_TRACT | Status: DC

## 2023-04-19 MED ORDER — SODIUM CHLORIDE 0.9 % IV BOLUS
500.0000 mL | Freq: Once | INTRAVENOUS | Status: AC
  Administered 2023-04-19: 500 mL via INTRAVENOUS

## 2023-04-19 MED ORDER — AZITHROMYCIN 250 MG PO TABS
500.0000 mg | ORAL_TABLET | Freq: Once | ORAL | Status: AC
  Administered 2023-04-19: 500 mg via ORAL
  Filled 2023-04-19: qty 2

## 2023-04-19 MED ORDER — LISINOPRIL 20 MG PO TABS
40.0000 mg | ORAL_TABLET | Freq: Every day | ORAL | Status: DC
  Administered 2023-04-20 – 2023-04-21 (×2): 40 mg via ORAL
  Filled 2023-04-19 (×2): qty 2

## 2023-04-19 MED ORDER — SODIUM CHLORIDE 0.9 % IV SOLN
2.0000 g | INTRAVENOUS | Status: AC
  Administered 2023-04-20 – 2023-04-23 (×4): 2 g via INTRAVENOUS
  Filled 2023-04-19 (×4): qty 20

## 2023-04-19 MED ORDER — BUDESONIDE 0.5 MG/2ML IN SUSP
0.5000 mg | Freq: Two times a day (BID) | RESPIRATORY_TRACT | Status: DC
  Administered 2023-04-19 – 2023-04-30 (×23): 0.5 mg via RESPIRATORY_TRACT
  Filled 2023-04-19 (×23): qty 2

## 2023-04-19 MED ORDER — ACETAMINOPHEN 650 MG RE SUPP
650.0000 mg | Freq: Four times a day (QID) | RECTAL | Status: DC | PRN
Start: 2023-04-19 — End: 2023-04-30

## 2023-04-19 MED ORDER — DIAZEPAM 5 MG PO TABS
5.0000 mg | ORAL_TABLET | Freq: Four times a day (QID) | ORAL | Status: DC | PRN
  Administered 2023-04-21 – 2023-04-29 (×8): 5 mg via ORAL
  Filled 2023-04-19 (×8): qty 1

## 2023-04-19 MED ORDER — BETAXOLOL HCL 0.5 % OP SOLN
1.0000 [drp] | Freq: Two times a day (BID) | OPHTHALMIC | Status: DC
  Administered 2023-04-20 – 2023-04-30 (×20): 1 [drp] via OPHTHALMIC
  Filled 2023-04-19 (×2): qty 5

## 2023-04-19 MED ORDER — FAMOTIDINE 20 MG PO TABS
20.0000 mg | ORAL_TABLET | Freq: Two times a day (BID) | ORAL | Status: DC
  Administered 2023-04-19 – 2023-04-30 (×23): 20 mg via ORAL
  Filled 2023-04-19 (×24): qty 1

## 2023-04-19 MED ORDER — ACETAMINOPHEN 325 MG PO TABS
650.0000 mg | ORAL_TABLET | Freq: Once | ORAL | Status: AC
Start: 2023-04-19 — End: 2023-04-19
  Administered 2023-04-19: 650 mg via ORAL

## 2023-04-19 MED ORDER — SODIUM CHLORIDE 0.9 % IV SOLN
1.0000 g | Freq: Once | INTRAVENOUS | Status: AC
  Administered 2023-04-19: 1 g via INTRAVENOUS
  Filled 2023-04-19: qty 10

## 2023-04-19 MED ORDER — VITAMIN C 500 MG PO TABS
500.0000 mg | ORAL_TABLET | Freq: Every day | ORAL | Status: DC
Start: 2023-04-19 — End: 2023-04-30
  Administered 2023-04-19 – 2023-04-30 (×12): 500 mg via ORAL
  Filled 2023-04-19 (×12): qty 1

## 2023-04-19 MED ORDER — REMDESIVIR 100 MG IV SOLR
200.0000 mg | Freq: Once | INTRAVENOUS | Status: AC
  Administered 2023-04-19: 200 mg via INTRAVENOUS
  Filled 2023-04-19: qty 40

## 2023-04-19 MED ORDER — LORATADINE 10 MG PO TABS
10.0000 mg | ORAL_TABLET | Freq: Every day | ORAL | Status: DC
  Administered 2023-04-19 – 2023-04-30 (×12): 10 mg via ORAL
  Filled 2023-04-19 (×12): qty 1

## 2023-04-19 MED ORDER — ACETAMINOPHEN 325 MG PO TABS
650.0000 mg | ORAL_TABLET | Freq: Four times a day (QID) | ORAL | Status: DC | PRN
  Administered 2023-04-20 – 2023-04-29 (×7): 650 mg via ORAL
  Filled 2023-04-19 (×7): qty 2

## 2023-04-19 MED ORDER — IOHEXOL 350 MG/ML SOLN
59.0000 mL | Freq: Once | INTRAVENOUS | Status: AC | PRN
  Administered 2023-04-19: 59 mL via INTRAVENOUS

## 2023-04-19 MED ORDER — ADULT MULTIVITAMIN W/MINERALS CH
1.0000 | ORAL_TABLET | Freq: Every day | ORAL | Status: DC
  Administered 2023-04-19 – 2023-04-30 (×12): 1 via ORAL
  Filled 2023-04-19 (×12): qty 1

## 2023-04-19 MED ORDER — ENOXAPARIN SODIUM 40 MG/0.4ML IJ SOSY
40.0000 mg | PREFILLED_SYRINGE | INTRAMUSCULAR | Status: DC
  Administered 2023-04-19 – 2023-04-29 (×11): 40 mg via SUBCUTANEOUS
  Filled 2023-04-19 (×11): qty 0.4

## 2023-04-19 MED ORDER — CEFTRIAXONE SODIUM 1 G IJ SOLR: 1.0000 g | INTRAMUSCULAR | Status: DC

## 2023-04-19 NOTE — ED Notes (Signed)
 EDP at [facility]. Family at [facility]. Pt with increased wob. Up to [facility] for BM per request. RT into see.

## 2023-04-19 NOTE — ED Notes (Signed)
 Xray at [facility]

## 2023-04-19 NOTE — ED Notes (Signed)
 RT notified

## 2023-04-19 NOTE — ED Notes (Signed)
 RT notified re: pt

## 2023-04-19 NOTE — ED Notes (Signed)
 ED TO INPATIENT HANDOFF REPORT  ED Nurse Name and Phone #:   S Name/Age/Gender Melanie Armstrong 68 y.o. female Room/Bed: 009C/009C  Code Status   Code Status: Full Code  Home/SNF/Other Home Patient oriented to: self, place, time, and situation Is this baseline? Yes   Triage Complete: Triage complete  Chief Complaint Pneumonia due to COVID-19 virus [U07.1, J12.82]  Triage Note Pt c/o worsening URI symptoms and SOB for the last week. Recently started on abx for bronchitis. During triage pt noted be 74-76% on RA.   Pt improved to 87% oxygen on 6 lpm O2 via [place].    Allergies Allergies  Allergen Reactions   Advair Diskus [Fluticasone -Salmeterol]     Per patient, she thinks she developed walking PNA.    Avelox [Moxifloxacin Hcl In Nacl] Other (See Comments)    Muscle Aches   Ciprofloxacin Hives   Irbesartan Other (See Comments)   Levofloxacin Other (See Comments)    Tendon pain   Olmesartan Other (See Comments)   Other Other (See Comments)   Protonix [Pantoprazole Sodium] Diarrhea   Trimethoprim Hives    Level of Care/Admitting Diagnosis ED Disposition     ED Disposition  Admit   Condition  --   Comment  Hospital Area: [facility] [[id number]]  Level of Care: Progressive [102]  Admit to Progressive based on following criteria: RESPIRATORY PROBLEMS hypoxemic/hypercapnic respiratory failure that is responsive to NIPPV (BiPAP) or High Flow Nasal Cannula (6-80 lpm). Frequent assessment/intervention, no > Q2 hrs < Q4 hrs, to maintain oxygenation and pulmonary hygiene.  Admit to Progressive based on following criteria: CARDIOVASCULAR & THORACIC of moderate stability with acute coronary syndrome symptoms/low risk myocardial infarction/hypertensive urgency/arrhythmias/heart failure potentially compromising stability and stable post cardiovascular intervention patients.  May admit patient to [facility] or [facility] if equivalent level of care is available:: No   Covid Evaluation: Confirmed COVID Positive  Diagnosis: Pneumonia due to COVID-19 virus [[id number]]  Admitting Physician: CLAUDENE MAXIMINO LABOR [[id number]]  Attending Physician: CLAUDENE MAXIMINO LABOR [[id number]]  Certification:: I certify this patient will need inpatient services for at least 2 midnights  Expected Medical Readiness: 04/21/2023          B Medical/Surgery History Past Medical History:  Diagnosis Date   Arthritis    Asthma    Chronic dermatitis 10/19/2019   GERD (gastroesophageal reflux disease)    Heart murmur    as a child    Hypertension    Past Surgical History:  Procedure Laterality Date   CHOLECYSTECTOMY     TONSILLECTOMY     TOTAL HIP ARTHROPLASTY Right 08/22/2014   Procedure: RIGHT TOTAL HIP ARTHROPLASTY ANTERIOR APPROACH;  Surgeon: Donnice Car, MD;  Location: [facility];  Service: Orthopedics;  Laterality: Right;   TOTAL HIP ARTHROPLASTY Left 01/2019     A IV Location/Drains/Wounds Patient Lines/Drains/Airways Status     Active Line/Drains/Airways     Name Placement date Placement time Site Days   Peripheral IV 04/19/23 22 G 1 Left;Posterior Hand 04/19/23  0900  Hand  less than 1   Peripheral IV 04/19/23 20 G 1 Right;Lateral Wrist 04/19/23  0912  Wrist  less than 1   Incision (Closed) 08/22/14 Hip Right 08/22/14  0750  -- 3162            Intake/Output Last 24 hours  Intake/Output Summary (Last 24 hours) at 04/19/2023 1619 Last data filed at 04/19/2023 1212 Gross per 24 hour  Intake 700 ml  Output --  Net  700 ml    Labs/Imaging Results for orders placed or performed during the hospital encounter of 04/19/23 (from the past 48 hours)  Resp panel by RT-PCR (RSV, Flu A&B, Covid) Anterior Nasal Swab     Status: Abnormal   Collection Time: 04/19/23  9:10 AM   Specimen: Anterior Nasal Swab  Result Value Ref Range   SARS Coronavirus 2 by RT PCR POSITIVE (A) NEGATIVE   Influenza A by PCR NEGATIVE NEGATIVE   Influenza B by PCR NEGATIVE NEGATIVE    Comment:  (NOTE) The Xpert Xpress SARS-CoV-2/FLU/RSV plus assay is intended as an aid in the diagnosis of influenza from Nasopharyngeal swab specimens and should not be used as a sole basis for treatment. Nasal washings and aspirates are unacceptable for Xpert Xpress SARS-CoV-2/FLU/RSV testing.  Fact Sheet for Patients: bloggercourse.com  Fact Sheet for Healthcare Providers: seriousbroker.it  This test is not yet approved or cleared by the [place]  FDA and has been authorized for detection and/or diagnosis of SARS-CoV-2 by FDA under an Emergency Use Authorization (EUA). This EUA will remain in effect (meaning this test can be used) for the duration of the COVID-19 declaration under Section 564(b)(1) of the Act, 21 U.S.C. section 360bbb-3(b)(1), unless the authorization is terminated or revoked.     Resp Syncytial Virus by PCR NEGATIVE NEGATIVE    Comment: (NOTE) Fact Sheet for Patients: bloggercourse.com  Fact Sheet for Healthcare Providers: seriousbroker.it  This test is not yet approved or cleared by the [place]  FDA and has been authorized for detection and/or diagnosis of SARS-CoV-2 by FDA under an Emergency Use Authorization (EUA). This EUA will remain in effect (meaning this test can be used) for the duration of the COVID-19 declaration under Section 564(b)(1) of the Act, 21 U.S.C. section 360bbb-3(b)(1), unless the authorization is terminated or revoked.  Performed at [facility], [address], [place], [place] [address]   Comprehensive metabolic panel     Status: Abnormal   Collection Time: 04/19/23  9:12 AM  Result Value Ref Range   Sodium 138 135 - 145 mmol/L   Potassium 2.9 (L) 3.5 - 5.1 mmol/L   Chloride 100 98 - 111 mmol/L   CO2 26 22 - 32 mmol/L   Glucose, Bld 121 (H) 70 - 99 mg/dL    Comment: Glucose reference range applies only to samples taken  after fasting for at least 8 hours.   BUN 21 8 - 23 mg/dL   Creatinine, Ser 9.14 0.44 - 1.00 mg/dL   Calcium  9.0 8.9 - 10.3 mg/dL   Total Protein 5.7 (L) 6.5 - 8.1 g/dL   Albumin 2.7 (L) 3.5 - 5.0 g/dL   AST 41 15 - 41 U/L   ALT 36 0 - 44 U/L   Alkaline Phosphatase 63 38 - 126 U/L   Total Bilirubin 0.9 0.0 - 1.2 mg/dL   GFR, Estimated >39 >39 mL/min    Comment: (NOTE) Calculated using the CKD-EPI Creatinine Equation (2021)    Anion gap 12 5 - 15    Comment: Performed at [facility], [address], [place], [place] [address]  CBC     Status: None   Collection Time: 04/19/23  9:12 AM  Result Value Ref Range   WBC 7.5 4.0 - 10.5 K/uL   RBC 4.48 3.87 - 5.11 MIL/uL   Hemoglobin 14.1 12.0 - 15.0 g/dL   HCT 57.7 63.9 - 53.9 %   MCV 94.2 80.0 - 100.0 fL   MCH  31.5 26.0 - 34.0 pg   MCHC 33.4 30.0 - 36.0 g/dL   RDW 87.6 88.4 - 84.4 %   Platelets 232 150 - 400 K/uL   nRBC 0.0 0.0 - 0.2 %    Comment: Performed at [facility], [address], [place], [place] [address]  Brain natriuretic peptide     Status: None   Collection Time: 04/19/23  9:12 AM  Result Value Ref Range   B Natriuretic Peptide 41.9 0.0 - 100.0 pg/mL    Comment: Performed at [facility], [address], [place], [place] [address]  I-Stat CG4 Lactic Acid     Status: None   Collection Time: 04/19/23  9:23 AM  Result Value Ref Range   Lactic Acid, Venous 1.4 0.5 - 1.9 mmol/L  I-Stat arterial blood gas, ED ([facility], [facility], [facility])     Status: Abnormal   Collection Time: 04/19/23 11:17 AM  Result Value Ref Range   pH, Arterial 7.470 (H) 7.35 - 7.45   pCO2 arterial 38.1 32 - 48 mmHg   pO2, Arterial 64 (L) 83 - 108 mmHg   Bicarbonate 27.7 20.0 - 28.0 mmol/L   TCO2 29 22 - 32 mmol/L   O2 Saturation 93 %   Acid-Base Excess 4.0 (H) 0.0 - 2.0 mmol/L   Sodium 138 135 - 145 mmol/L   Potassium 2.8 (L) 3.5 - 5.1 mmol/L   Calcium , Ion 1.22 1.15 - 1.40 mmol/L   HCT 35.0 (L) 36.0 - 46.0 %   Hemoglobin 11.9 (L)  12.0 - 15.0 g/dL   Sample type ARTERIAL   HIV Antibody (routine testing w rflx)     Status: None   Collection Time: 04/19/23 11:25 AM  Result Value Ref Range   HIV Screen 4th Generation wRfx Non Reactive Non Reactive    Comment: Performed at [facility], [address] [place], [place] [address]  Procalcitonin     Status: None   Collection Time: 04/19/23 11:25 AM  Result Value Ref Range   Procalcitonin <0.10 ng/mL    Comment:        Interpretation: PCT (Procalcitonin) <= 0.5 ng/mL: Systemic infection (sepsis) is not likely. Local bacterial infection is possible. (NOTE)       Sepsis PCT Algorithm           Lower Respiratory Tract                                      Infection PCT Algorithm    ----------------------------     ----------------------------         PCT < 0.25 ng/mL                PCT < 0.10 ng/mL          Strongly encourage             Strongly discourage   discontinuation of antibiotics    initiation of antibiotics    ----------------------------     -----------------------------       PCT 0.25 - 0.50 ng/mL            PCT 0.10 - 0.25 ng/mL               OR       >80% decrease in PCT            Discourage initiation of  antibiotics      Encourage discontinuation           of antibiotics    ----------------------------     -----------------------------         PCT >= 0.50 ng/mL              PCT 0.26 - 0.50 ng/mL               AND        <80% decrease in PCT             Encourage initiation of                                             antibiotics       Encourage continuation           of antibiotics    ----------------------------     -----------------------------        PCT >= 0.50 ng/mL                  PCT > 0.50 ng/mL               AND         increase in PCT                  Strongly encourage                                      initiation of antibiotics    Strongly encourage escalation           of  antibiotics                                     -----------------------------                                           PCT <= 0.25 ng/mL                                                 OR                                        > 80% decrease in PCT                                      Discontinue / Do not initiate                                             antibiotics  Performed at [facility], [address], [place], [place] [address]   Magnesium      Status: None   Collection Time:  04/19/23 11:25 AM  Result Value Ref Range   Magnesium  2.2 1.7 - 2.4 mg/dL    Comment: Performed at [facility], [address] [place], [place] [address]  Phosphorus     Status: None   Collection Time: 04/19/23 11:25 AM  Result Value Ref Range   Phosphorus 2.5 2.5 - 4.6 mg/dL    Comment: Performed at [facility], [address] [place], [place] [address]  D-dimer, quantitative     Status: Abnormal   Collection Time: 04/19/23 11:25 AM  Result Value Ref Range   D-Dimer, Quant 1.19 (H) 0.00 - 0.50 ug/mL-FEU    Comment: (NOTE) At the manufacturer cut-off value of 0.5 g/mL FEU, this assay has a negative predictive value of 95-100%.This assay is intended for use in conjunction with a clinical pretest probability (PTP) assessment model to exclude pulmonary embolism (PE) and deep venous thrombosis (DVT) in outpatients suspected of PE or DVT. Results should be correlated with clinical presentation. Performed at [facility], [address], [place], [place] [address]   MRSA Next Gen by PCR, Nasal     Status: None   Collection Time: 04/19/23 11:34 AM   Specimen: Nasal Mucosa; Nasal Swab  Result Value Ref Range   MRSA by PCR Next Gen NOT DETECTED NOT DETECTED    Comment: (NOTE) The GeneXpert MRSA Assay (FDA approved for NASAL specimens only), is one component of a comprehensive MRSA colonization surveillance program. It is not intended to diagnose MRSA infection nor to  guide or monitor treatment for MRSA infections. Test performance is not FDA approved in patients less than 48 years old. Performed at [facility], [address], [place], [place] [address]    DG Chest Port 1 View Result Date: 04/19/2023 CLINICAL DATA:  68 year old female with history of shortness of breath, cough and fever. EXAM: PORTABLE CHEST 1 VIEW COMPARISON:  Chest x-ray 08/14/2017. FINDINGS: Diffuse interstitial prominence and peribronchial cuffing noted in the lungs bilaterally suggesting a background of bronchitis. Patchy ill-defined opacities are noted throughout the left mid to lower lung, concerning for bronchopneumonia. Probable small left pleural effusion. No definite right pleural effusion. No pneumothorax. No evidence of pulmonary edema. Heart size is borderline enlarged. The patient is rotated to the left on today's exam, resulting in distortion of the mediastinal contours and reduced diagnostic sensitivity and specificity for mediastinal pathology. IMPRESSION: 1. The appearance of the chest is most compatible with bronchitis with probable left-sided bronchopneumonia. Followup PA and lateral chest X-ray is recommended in 3-4 weeks following trial of antibiotic therapy to ensure resolution and exclude underlying malignancy. Electronically Signed   By: Toribio Aye M.D.   On: 04/19/2023 09:51    Pending Labs Unresulted Labs (From admission, onward)     Start     Ordered   04/20/23 0500  CBC  Tomorrow morning,   R        04/19/23 1100   04/20/23 0500  Basic metabolic panel  Tomorrow morning,   R        04/19/23 1100   04/19/23 1100  Strep pneumoniae urinary antigen  Once,   R        04/19/23 1100   04/19/23 0857  Blood culture (routine x 2)  BLOOD CULTURE X 2,   R      04/19/23 0910            Vitals/Pain Today's Vitals   04/19/23 1200 04/19/23 1220 04/19/23 1500 04/19/23  1533  BP:  104/63  (!) 111/57  Pulse: 66 68 70 63  Resp: (!) 25 15 16  (!) 29  Temp:       TempSrc:      SpO2: 90% 94% 95% 97%  PainSc:        Isolation Precautions Airborne and Contact precautions  Medications Medications  enoxaparin  (LOVENOX ) injection 40 mg (has no administration in time range)  sodium chloride  flush (NS) 0.9 % injection 3 mL (3 mLs Intravenous Given 04/19/23 1113)  acetaminophen  (TYLENOL ) tablet 650 mg (has no administration in time range)    Or  acetaminophen  (TYLENOL ) suppository 650 mg (has no administration in time range)  guaiFENesin  (MUCINEX ) 12 hr tablet 600 mg (600 mg Oral Given 04/19/23 1117)  azithromycin  (ZITHROMAX ) tablet 500 mg (has no administration in time range)  cefTRIAXone  (ROCEPHIN ) 2 g in sodium chloride  0.9 % 100 mL IVPB (has no administration in time range)  remdesivir  200 mg in sodium chloride  0.9% 250 mL IVPB (0 mg Intravenous Stopped 04/19/23 1351)    Followed by  remdesivir  100 mg in sodium chloride  0.9 % 100 mL IVPB (has no administration in time range)  methylPREDNISolone  sodium succinate (SOLU-MEDROL ) 125 mg/2 mL injection 100 mg (100 mg Intravenous Given 04/19/23 1224)    Followed by  predniSONE  (DELTASONE ) tablet 50 mg (has no administration in time range)  famotidine  (PEPCID ) tablet 20 mg (20 mg Oral Given 04/19/23 1224)  phosphorus (K PHOS  NEUTRAL) tablet 500 mg (has no administration in time range)  budesonide  (PULMICORT ) nebulizer solution 0.5 mg (0.5 mg Nebulization Given 04/19/23 1616)  arformoterol  (BROVANA ) nebulizer solution 15 mcg (15 mcg Nebulization Given 04/19/23 1617)  albuterol  (PROVENTIL ) (2.5 MG/3ML) 0.083% nebulizer solution 2.5 mg (2.5 mg Nebulization Given 04/19/23 1614)  diazepam  (VALIUM ) tablet 5 mg (has no administration in time range)  ascorbic acid  (VITAMIN C ) tablet 500 mg (500 mg Oral Given 04/19/23 1616)  multivitamin with minerals tablet 1 tablet (1 tablet Oral Given 04/19/23 1616)  tiotropium (SPIRIVA ) inhalation capsule ([facility] use ONLY) 18 mcg (18 mcg Inhalation Not Given 04/19/23 1552)  latanoprost  (XALATAN )  0.005 % ophthalmic solution 1 drop (has no administration in time range)  dorzolamide  (TRUSOPT ) 2 % ophthalmic solution 1 drop (1 drop Both Eyes Not Given 04/19/23 1551)  betaxolol  (BETOPTIC -S) 0.5 % ophthalmic solution 1 drop (1 drop Both Eyes Not Given 04/19/23 1551)  loratadine  (CLARITIN ) tablet 10 mg (10 mg Oral Given 04/19/23 1616)  sodium chloride  0.9 % bolus 500 mL (0 mLs Intravenous Stopped 04/19/23 0958)  cefTRIAXone  (ROCEPHIN ) 1 g in sodium chloride  0.9 % 100 mL IVPB (0 g Intravenous Stopped 04/19/23 1013)  azithromycin  (ZITHROMAX ) tablet 500 mg (500 mg Oral Given 04/19/23 1012)  acetaminophen  (TYLENOL ) tablet 650 mg (650 mg Oral Given 04/19/23 1013)  potassium chloride  SA (KLOR-CON  M) CR tablet 40 mEq (40 mEq Oral Given 04/19/23 1024)  cefTRIAXone  (ROCEPHIN ) 1 g in sodium chloride  0.9 % 100 mL IVPB (0 g Intravenous Stopped 04/19/23 1212)  potassium chloride  SA (KLOR-CON  M) CR tablet 20 mEq (20 mEq Oral Given 04/19/23 1224)    Mobility walks     Focused Assessments    R Recommendations: See Admitting Provider Note  Report given to:   Additional Notes:

## 2023-04-19 NOTE — ED Provider Notes (Signed)
 [place] EMERGENCY DEPARTMENT AT [facility] Provider Note   CSN: [id number] Arrival date & time: 04/19/23  9195     History  Chief Complaint  Patient presents with   Shortness of Breath    Melanie Armstrong is a 68 y.o. female.   Shortness of Breath    Patient has a history of hypertension asthma acid reflux arthritis.  She presents ED for evaluation of cough worsening shortness of breath.  Patient has also been having fevers patient states she has had symptoms for the last week or so.  She saw her doctor and was started on antibiotics for bronchitis.  Patient symptoms however been getting worse and she is becoming more short of breath.  She came to the ED.  Home Medications Prior to Admission medications   Medication Sig Start Date End Date Taking? Authorizing Provider  albuterol  (PROAIR  HFA) 108 (90 Base) MCG/ACT inhaler Inhale 2 puffs into the lungs every 4 (four) hours as needed. 01/10/19   Bobbitt, Elgin Pepper, MD  albuterol  (PROVENTIL ) (2.5 MG/3ML) 0.083% nebulizer solution USE 1 VIAL IN NEBULIZER EVERY 4 HOURS AS NEEDED FOR WHEEZING OR  SHORTNESS  OF  BREATH 06/22/17   Bobbitt, Elgin Pepper, MD  azelastine  (ASTELIN ) 0.1 % nasal spray Place into both nostrils. 04/02/20   [provider]  betaxolol  (BETOPTIC -S) 0.5 % ophthalmic suspension 1 drop 2 (two) times daily. 06/05/22   [provider]  budesonide -formoterol  (SYMBICORT ) 160-4.5 MCG/ACT inhaler Inhale 2 puffs into the lungs in the morning and at bedtime. with spacer and rinse mouth afterwards. 06/11/22   Luke Orlan HERO, DO  calcium -vitamin D (OSCAL WITH D) 500-200 MG-UNIT per tablet Take 1 tablet by mouth daily with breakfast.    [provider]  diazepam  (VALIUM ) 5 MG tablet Take 5 mg by mouth every 6 (six) hours as needed for anxiety.    [provider]  Docusate Sodium  (DSS) 100 MG CAPS 1 capsule as needed Orally Once a day    [provider]  dorzolamide  (TRUSOPT ) 2 %  ophthalmic solution     [provider]  EPINEPHrine  0.3 mg/0.3 mL IJ SOAJ injection Inject 0.3 mg into the muscle as needed for anaphylaxis. 03/23/23   Luke Orlan HERO, DO  Ferrous Sulfate  (IRON) 325 (65 Fe) MG TABS     [provider]  fexofenadine (ALLEGRA) 180 MG tablet Take 180 mg by mouth daily.    [provider]  furosemide  (LASIX ) 20 MG tablet Take 20 mg by mouth every morning.    [provider]  glucosamine-chondroitin 500-400 MG tablet Take 1 tablet by mouth 2 (two) times daily.    [provider]  latanoprost  (XALATAN ) 0.005 % ophthalmic solution 1 drop at bedtime. 04/02/22   [provider]  lisinopril  (ZESTRIL ) 20 MG tablet Take 40 mg by mouth daily. 02/13/20   [provider]  montelukast  (SINGULAIR ) 10 MG tablet Take 1 tablet (10 mg total) by mouth at bedtime. 05/16/19   Bobbitt, Elgin Pepper, MD  Multiple Vitamin (MULTIVITAMIN WITH MINERALS) TABS tablet Take 1 tablet by mouth daily.    [provider]  Omega-3 Fatty Acids (OMEGA 3 PO) Take 1 capsule by mouth daily.    [provider]  omeprazole  (PRILOSEC) 20 MG capsule Take 1 capsule (20 mg total) by mouth daily. 11/26/15   Bobbitt, Elgin Pepper, MD  PRESCRIPTION MEDICATION once a week.    [provider]  SPIRIVA  HANDIHALER 18 MCG inhalation capsule Place  18 mcg into inhaler and inhale daily. 03/20/23   [provider]  vitamin C  (ASCORBIC ACID ) 500 MG tablet Take 500 mg by mouth daily.    [provider]      Allergies    Advair diskus [fluticasone -salmeterol], Avelox [moxifloxacin hcl in nacl], Ciprofloxacin, Irbesartan, Levofloxacin, Olmesartan, Other, Protonix [pantoprazole sodium], and Trimethoprim    Review of Systems   Review of Systems  Respiratory:  Positive for shortness of breath.     Physical Exam Updated Vital Signs BP 128/64   Pulse 73   Temp (!) 101.2 F (38.4 C)   Resp (!) 23   SpO2 92%  Physical  Exam Vitals and nursing note reviewed.  Constitutional:      General: She is not in acute distress.    Appearance: She is well-developed.  HENT:     Head: Normocephalic and atraumatic.     Right Ear: External ear normal.     Left Ear: External ear normal.  Eyes:     General: No scleral icterus.       Right eye: No discharge.        Left eye: No discharge.     Conjunctiva/sclera: Conjunctivae normal.  Neck:     Trachea: No tracheal deviation.  Cardiovascular:     Rate and Rhythm: Normal rate and regular rhythm.  Pulmonary:     Effort: Pulmonary effort is normal. No respiratory distress.     Breath sounds: No stridor. Rales present. No wheezing.  Abdominal:     General: Bowel sounds are normal. There is no distension.     Palpations: Abdomen is soft.     Tenderness: There is no abdominal tenderness. There is no guarding or rebound.  Musculoskeletal:        General: No tenderness or deformity.     Cervical back: Neck supple.  Skin:    General: Skin is warm and dry.     Findings: No rash.  Neurological:     General: No focal deficit present.     Mental Status: She is alert.     Cranial Nerves: No cranial nerve deficit, dysarthria or facial asymmetry.     Sensory: No sensory deficit.     Motor: No abnormal muscle tone or seizure activity.     Coordination: Coordination normal.  Psychiatric:        Mood and Affect: Mood normal.     ED Results / Procedures / Treatments   Labs (all labs ordered are listed, but only abnormal results are displayed) Labs Reviewed  RESP PANEL BY RT-PCR (RSV, FLU A&B, COVID)  RVPGX2 - Abnormal; Notable for the following components:      Result Value   SARS Coronavirus 2 by RT PCR POSITIVE (*)    All other components within normal limits  COMPREHENSIVE METABOLIC PANEL - Abnormal; Notable for the following components:   Potassium 2.9 (*)    Glucose, Bld 121 (*)    Total Protein 5.7 (*)    Albumin 2.7 (*)    All other components within normal  limits  CULTURE, BLOOD (ROUTINE X 2)  CULTURE, BLOOD (ROUTINE X 2)  CBC  I-STAT CG4 LACTIC ACID, ED    EKG EKG Interpretation Date/Time:  Sunday April 19 2023 09:34:27 EST Ventricular Rate:  74 PR Interval:  152 QRS Duration:  94 QT Interval:  402 QTC Calculation: 446 R Axis:   -15  Text Interpretation: Sinus rhythm Low voltage, precordial leads LVH by voltage Confirmed by Randol,  Thom [phone]) on 04/19/2023 10:01:00 AM  Radiology DG Chest Port 1 View Result Date: 04/19/2023 CLINICAL DATA:  68 year old female with history of shortness of breath, cough and fever. EXAM: PORTABLE CHEST 1 VIEW COMPARISON:  Chest x-ray 08/14/2017. FINDINGS: Diffuse interstitial prominence and peribronchial cuffing noted in the lungs bilaterally suggesting a background of bronchitis. Patchy ill-defined opacities are noted throughout the left mid to lower lung, concerning for bronchopneumonia. Probable small left pleural effusion. No definite right pleural effusion. No pneumothorax. No evidence of pulmonary edema. Heart size is borderline enlarged. The patient is rotated to the left on today's exam, resulting in distortion of the mediastinal contours and reduced diagnostic sensitivity and specificity for mediastinal pathology. IMPRESSION: 1. The appearance of the chest is most compatible with bronchitis with probable left-sided bronchopneumonia. Followup PA and lateral chest X-ray is recommended in 3-4 weeks following trial of antibiotic therapy to ensure resolution and exclude underlying malignancy. Electronically Signed   By: Toribio Aye M.D.   On: 04/19/2023 09:51    Procedures .Critical Care  Performed by: Randol Thom, MD Authorized by: Randol Thom, MD   Critical care provider statement:    Critical care time (minutes):  30   Critical care was time spent personally by me on the following activities:  Development of treatment plan with patient or surrogate, discussions with consultants, evaluation of  patient's response to treatment, examination of patient, ordering and review of laboratory studies, ordering and review of radiographic studies, ordering and performing treatments and interventions, pulse oximetry, re-evaluation of patient's condition and review of old charts     Medications Ordered in ED Medications  acetaminophen  (TYLENOL ) 325 MG tablet (has no administration in time range)  potassium chloride  SA (KLOR-CON  M) CR tablet 40 mEq (has no administration in time range)  sodium chloride  0.9 % bolus 500 mL (0 mLs Intravenous Stopped 04/19/23 0958)  cefTRIAXone  (ROCEPHIN ) 1 g in sodium chloride  0.9 % 100 mL IVPB (0 g Intravenous Stopped 04/19/23 1013)  azithromycin  (ZITHROMAX ) tablet 500 mg (500 mg Oral Given 04/19/23 1012)  acetaminophen  (TYLENOL ) tablet 650 mg (650 mg Oral Given 04/19/23 1013)    ED Course/ Medical Decision Making/ A&P Clinical Course as of 04/19/23 1058  Sun Apr 19, 2023  1000 Chest x-ray is suggestive of bronchitis with left-sided bronchopneumonia [JK]  1018 Laboratory test notable for decreased potassium level [JK]  1047 Case discussed with Dr Claudene [JK]    Clinical Course User Index [JK] Randol Thom, MD                                 Medical Decision Making Problems Addressed: Community acquired pneumonia, unspecified laterality: acute illness or injury that poses a threat to life or bodily functions COVID: acute illness or injury that poses a threat to life or bodily functions Hypoxia: acute illness or injury that poses a threat to life or bodily functions  Amount and/or Complexity of Data Reviewed Labs: ordered. Decision-making details documented in ED Course. Radiology: ordered and independent interpretation performed.  Risk OTC drugs. Prescription drug management. Decision regarding hospitalization.   Patient presented to the ED for evaluation of shortness of breath.  Patient noted to be febrile and hypoxic at triage.  She has a new oxygen  requirement.  No evidence of evolving sepsis at this time Patient's laboratory tests are notable for covid as well as a new lobar pneumonia.  This would account for her hypoxia.  Patient has been started on IV antibiotics.  I have also ordered potassium replacement.  I will consult the medical service for admission and further treatment  Case discussed with Dr. Claudene.  Will add on ABG       Final Clinical Impression(s) / ED Diagnoses Final diagnoses:  None    Rx / DC Orders ED Discharge Orders     None         Randol Simmonds, MD 04/19/23 1058

## 2023-04-19 NOTE — H&P (Addendum)
 History and Physical    Patient: Melanie Armstrong FMW:[id number] DOB: Oct 10, 1955 DOA: 04/19/2023 DOS: the patient was seen and examined on 04/19/2023 PCP: Arloa Elsie SAUNDERS, MD  Patient coming from: Home  Chief Complaint:  Chief Complaint  Patient presents with   Shortness of Breath   HPI: [name] is a 68 y.o. female with medical history significant of hypertension, asthma, arthritis, and obesity presented with shortness of breath, cough, and wheezing over this week. She also reported experiencing subjective fevers and decreased appetite at home. The patient denied any leg pain or swelling.  At baseline she is not normally on oxygen.  Patient made note that she was vaccinated for flu, but had not been vaccinated against Covid-19.  Patient had been seen by her primary on 12/30 and had been started on clarithromycin  for suspected bronchitis.  She reported that her breathing progressively got worse which she had to the hospital for further evaluation.  In the ED patient was noted to to have a fever up to 101.2 F with tachypnea and O2 saturations noted to be as low as 74-76% on room air with improvement on high flow nasal cannula oxygen at 10 L.  CBC within normal limits, potassium 2.9, lactic acid 1.4.  Chest x-ray concerning bronchitis with probable left-sided bronchopneumonia.  COVID-19 screening was positive.  Blood cultures were obtained.  Patient had been given acetaminophen  650 mg p.o., potassium chloride  40 meq p.o., sodium chloride  500 mL bolus, Rocephin , and azithromycin .   Review of Systems: As mentioned in the history of present illness. All other systems reviewed and are negative. Past Medical History:  Diagnosis Date   Arthritis    Asthma    Chronic dermatitis 10/19/2019   GERD (gastroesophageal reflux disease)    Heart murmur    as a child    Hypertension    Past Surgical History:  Procedure Laterality Date   CHOLECYSTECTOMY     TONSILLECTOMY     TOTAL HIP ARTHROPLASTY  Right 08/22/2014   Procedure: RIGHT TOTAL HIP ARTHROPLASTY ANTERIOR APPROACH;  Surgeon: Donnice Car, MD;  Location: [facility];  Service: Orthopedics;  Laterality: Right;   TOTAL HIP ARTHROPLASTY Left 01/2019   Social History:  reports that she has never smoked. She has never used smokeless tobacco. She reports that she does not drink alcohol and does not use drugs.  Allergies  Allergen Reactions   Advair Diskus [Fluticasone -Salmeterol]     Per patient, she thinks she developed walking PNA.    Avelox [Moxifloxacin Hcl In Nacl] Other (See Comments)    Muscle Aches   Ciprofloxacin Hives   Irbesartan Other (See Comments)   Levofloxacin Other (See Comments)    Tendon pain   Olmesartan Other (See Comments)   Other Other (See Comments)   Protonix [Pantoprazole Sodium] Diarrhea   Trimethoprim Hives    Family History  Problem Relation Age of Onset   Heart disease Father    Allergic rhinitis Neg Hx    Angioedema Neg Hx    Asthma Neg Hx    Eczema Neg Hx    Immunodeficiency Neg Hx    Urticaria Neg Hx     Prior to Admission medications   Medication Sig Start Date End Date Taking? Authorizing Provider  albuterol  (PROAIR  HFA) 108 (90 Base) MCG/ACT inhaler Inhale 2 puffs into the lungs every 4 (four) hours as needed. 01/10/19   Bobbitt, Elgin Pepper, MD  albuterol  (PROVENTIL ) (2.5 MG/3ML) 0.083% nebulizer solution USE 1 VIAL IN NEBULIZER EVERY  4 HOURS AS NEEDED FOR WHEEZING OR  SHORTNESS  OF  BREATH 06/22/17   Bobbitt, Elgin Pepper, MD  azelastine  (ASTELIN ) 0.1 % nasal spray Place into both nostrils. 04/02/20   [provider]  betaxolol  (BETOPTIC -S) 0.5 % ophthalmic suspension 1 drop 2 (two) times daily. 06/05/22   [provider]  budesonide -formoterol  (SYMBICORT ) 160-4.5 MCG/ACT inhaler Inhale 2 puffs into the lungs in the morning and at bedtime. with spacer and rinse mouth afterwards. 06/11/22   Luke Orlan HERO, DO  calcium -vitamin D (OSCAL WITH D) 500-200 MG-UNIT per tablet Take  1 tablet by mouth daily with breakfast.    [provider]  diazepam  (VALIUM ) 5 MG tablet Take 5 mg by mouth every 6 (six) hours as needed for anxiety.    [provider]  Docusate Sodium  (DSS) 100 MG CAPS 1 capsule as needed Orally Once a day    [provider]  dorzolamide  (TRUSOPT ) 2 % ophthalmic solution     [provider]  EPINEPHrine  0.3 mg/0.3 mL IJ SOAJ injection Inject 0.3 mg into the muscle as needed for anaphylaxis. 03/23/23   Luke Orlan HERO, DO  Ferrous Sulfate  (IRON) 325 (65 Fe) MG TABS     [provider]  fexofenadine (ALLEGRA) 180 MG tablet Take 180 mg by mouth daily.    [provider]  furosemide  (LASIX ) 20 MG tablet Take 20 mg by mouth every morning.    [provider]  glucosamine-chondroitin 500-400 MG tablet Take 1 tablet by mouth 2 (two) times daily.    [provider]  latanoprost  (XALATAN ) 0.005 % ophthalmic solution 1 drop at bedtime. 04/02/22   [provider]  lisinopril  (ZESTRIL ) 20 MG tablet Take 40 mg by mouth daily. 02/13/20   [provider]  montelukast  (SINGULAIR ) 10 MG tablet Take 1 tablet (10 mg total) by mouth at bedtime. 05/16/19   Bobbitt, Elgin Pepper, MD  Multiple Vitamin (MULTIVITAMIN WITH MINERALS) TABS tablet Take 1 tablet by mouth daily.    [provider]  Omega-3 Fatty Acids (OMEGA 3 PO) Take 1 capsule by mouth daily.    [provider]  omeprazole  (PRILOSEC) 20 MG capsule Take 1 capsule (20 mg total) by mouth daily. 11/26/15   Bobbitt, Elgin Pepper, MD  PRESCRIPTION MEDICATION once a week.    [provider]  SPIRIVA  HANDIHALER 18 MCG inhalation capsule Place 18 mcg into inhaler and inhale daily. 03/20/23   [provider]  vitamin C  (ASCORBIC ACID ) 500 MG tablet Take 500 mg by mouth daily.    [provider]    Physical Exam: Vitals:   04/19/23 1015 04/19/23 1020 04/19/23 1025 04/19/23 1025  BP: (!) 122/59     Pulse:  75 73 74   Resp: (!) 24 (!) 22 17   Temp:      SpO2: (!) 88% 91% (!) 87% (!) 88%   Constitutional: Female who appears acutely ill but able to follow commands.  Eyes: PERRL, lids and conjunctivae normal ENMT: Mucous membranes are moist.  Normal dentition.  Neck: normal, supple,   Respiratory: Tachypneic with decreased overall aeration and crackles heard throughout both lung fields. Cardiovascular: Regular rate and rhythm, no murmurs / rubs / gallops. No extremity edema.    Abdomen: no tenderness, no masses palpated.  Bowel sounds positive.  Musculoskeletal: no clubbing / cyanosis. No joint deformity upper and lower extremities. Good ROM, no contractures. Normal muscle tone.  Skin: no rashes, lesions, ulcers. No induration Neurologic: CN 2-12  grossly intact. Sensation intact, DTR normal. Strength 5/5 in all 4.  Psychiatric: Normal judgment and insight. Alert and oriented x 3. Normal mood.   Data Reviewed:  EKG reveals sinus rhythm at 74 bpm with low voltage in the precordial leads.  Reviewed labs, imaging, and pertinent records as documented.  Assessment and Plan: Acute respiratory failure with hypoxia Sepsis secondary to COVID-19 pneumonia Severe persistent asthma exacerbation Patient presents with progressively worsening shortness of breath, cough, and wheezing.  Initially noted to be febrile up to 101.2 F with tachypnea meeting SIRS criteria.  Currently on 10 L of high flow nasal cannula oxygen to maintain O2 saturations, but normally not on oxygen at baseline.  Chest x-ray concerning bronchitis with probable left-sided bronchopneumonia.  COVID-19 screening was positive. -Admit to a progressive bed -COVID-19 focus order set utilized -Continuous pulse oximetry with oxygen maintain O2 saturations greater than 92% -Incentive spirometry and flutter valve -Follow-up blood cultures -Check procalcitonin, D-dimer, BNP, and phosphorus -Plan to check CT angiogram of the chest if D-dimer  elevated -Continue empiric antibiotics of Rocephin  and azithromycin  -Remdesivir  IV -Solu-Medrol  IV -Albuterol  nebs scheduled and as needed -Brovana  and budesonide  nebs -Continue Spiriva  and Singulair  -Mucinex   Hypokalemia Acute.  Potassium noted to be 2.9.  Patient had been given potassium chloride  40 meq p.o. x 1 dose. -Given additional 20 mEq of potassium chloride  -Check magnesium   -Continue to monitor and replace as needed\  Essential hypertension On admission blood pressures noted to be 104/63 to 128/69.  Pressure regimen includes lisinopril  40 mg daily and Lasix  40 mg daily. -Resume home blood pressure regimen once able  Anxiety -continue Valium  as needed  GERD -Continue pharmacy substitution of Pepcid   DVT prophylaxis: Lovenox  Advance Care Planning:   Code Status: Full Code    Consults: None  Family Communication: Daughter updated at bedside  Severity of Illness: The appropriate patient status for this patient is INPATIENT. Inpatient status is judged to be reasonable and necessary in order to provide the required intensity of service to ensure the patient's safety. The patient's presenting symptoms, physical exam findings, and initial radiographic and laboratory data in the context of their chronic comorbidities is felt to place them at high risk for further clinical deterioration. Furthermore, it is not anticipated that the patient will be medically stable for discharge from the hospital within 2 midnights of admission.   * I certify that at the point of admission it is my clinical judgment that the patient will require inpatient hospital care spanning beyond 2 midnights from the point of admission due to high intensity of service, high risk for further deterioration and high frequency of surveillance required.*  Author: Maximino DELENA Sharps, MD 04/19/2023 10:39 AM  For on call review [email or web address].

## 2023-04-19 NOTE — ED Notes (Signed)
 Admitting MD at [facility].

## 2023-04-19 NOTE — ED Triage Notes (Signed)
 Pt improved to 87% oxygen on 6 lpm O2 via .

## 2023-04-19 NOTE — ED Triage Notes (Signed)
 Pt c/o worsening URI symptoms and SOB for the last week. Recently started on abx for bronchitis. During triage pt noted be 74-76% on RA.

## 2023-04-19 NOTE — ED Notes (Signed)
 RT at [facility], ABG obtained.

## 2023-04-20 ENCOUNTER — Encounter (HOSPITAL_COMMUNITY): Payer: Self-pay | Admitting: Internal Medicine

## 2023-04-20 DIAGNOSIS — I10 Essential (primary) hypertension: Secondary | ICD-10-CM | POA: Diagnosis not present

## 2023-04-20 DIAGNOSIS — A419 Sepsis, unspecified organism: Secondary | ICD-10-CM | POA: Diagnosis not present

## 2023-04-20 DIAGNOSIS — U071 COVID-19: Secondary | ICD-10-CM | POA: Diagnosis not present

## 2023-04-20 DIAGNOSIS — J45901 Unspecified asthma with (acute) exacerbation: Secondary | ICD-10-CM | POA: Diagnosis not present

## 2023-04-20 LAB — CBC
HCT: 40.3 % (ref 36.0–46.0)
Hemoglobin: 13.5 g/dL (ref 12.0–15.0)
MCH: 31.5 pg (ref 26.0–34.0)
MCHC: 33.5 g/dL (ref 30.0–36.0)
MCV: 93.9 fL (ref 80.0–100.0)
Platelets: 215 10*3/uL (ref 150–400)
RBC: 4.29 MIL/uL (ref 3.87–5.11)
RDW: 12.1 % (ref 11.5–15.5)
WBC: 2.6 10*3/uL — ABNORMAL LOW (ref 4.0–10.5)
nRBC: 0 % (ref 0.0–0.2)

## 2023-04-20 LAB — C-REACTIVE PROTEIN: CRP: 13.2 mg/dL — ABNORMAL HIGH (ref <1.0)

## 2023-04-20 LAB — BASIC METABOLIC PANEL
Anion gap: 8 (ref 5–15)
BUN: 16 mg/dL (ref 8–23)
CO2: 24 mmol/L (ref 22–32)
Calcium: 8.6 mg/dL — ABNORMAL LOW (ref 8.9–10.3)
Chloride: 106 mmol/L (ref 98–111)
Creatinine, Ser: 0.71 mg/dL (ref 0.44–1.00)
GFR, Estimated: >60 mL/min (ref >60)
Glucose, Bld: 249 mg/dL — ABNORMAL HIGH (ref 70–99)
Potassium: 3.8 mmol/L (ref 3.5–5.1)
Sodium: 138 mmol/L (ref 135–145)

## 2023-04-20 LAB — STREP PNEUMONIAE URINARY ANTIGEN: Strep Pneumo Urinary Antigen: NEGATIVE

## 2023-04-20 MED ORDER — MONTELUKAST SODIUM 10 MG PO TABS
10.0000 mg | ORAL_TABLET | Freq: Every day | ORAL | Status: DC
  Administered 2023-04-20 – 2023-04-29 (×10): 10 mg via ORAL
  Filled 2023-04-20 (×10): qty 1

## 2023-04-20 MED ORDER — GUAIFENESIN-DM 100-10 MG/5ML PO SYRP
5.0000 mL | ORAL_SOLUTION | ORAL | Status: DC | PRN
  Administered 2023-04-20 – 2023-04-30 (×14): 5 mL via ORAL
  Filled 2023-04-20 (×13): qty 5

## 2023-04-20 MED ORDER — FUROSEMIDE 40 MG PO TABS
40.0000 mg | ORAL_TABLET | Freq: Every morning | ORAL | Status: DC
  Administered 2023-04-20 – 2023-04-30 (×11): 40 mg via ORAL
  Filled 2023-04-20 (×11): qty 1

## 2023-04-20 NOTE — Progress Notes (Signed)
 Patient up to [facility] with HHFNC 50/100.  Oxygen saturation down to 80-82%, NRB placed in addition to HHFNC.  Patient respirations 18-23, even unlabored.  Patient back to bed.  O2 sats remain 87-88% after 10 min.  RT remains at bedside.

## 2023-04-20 NOTE — Progress Notes (Signed)
 Oxygen saturation drifting down to 87% on 40/100 HHFNC.  RT notified, at bedside.  Oxygen increased to 50/100 HHFNC.  Patient in NAD.  Tolerating well.

## 2023-04-20 NOTE — Progress Notes (Signed)
 Oxygen saturation down to 86-88% on 15L HFNC. RT paged and to bedside.  Oxygen increased to HHFNC 110/40.  Sats up to 90%.  Patient tolerating well.  States that this feels much better.

## 2023-04-20 NOTE — Hospital Course (Addendum)
 Melanie Armstrong is a 68 y.o. female with a history of hypertension, asthma, arthritis, obesity.  Patient presented secondary to shortness of breath, coughing, wheezing was found to have severe hypoxia.  Workup significant for COVID-19 positivity.  CTA chest significant for multifocal infiltrates concerning for multifocal pneumonia.  Patient started empirically on ceftriaxone  and azithromycin .  Patient was started on steroids and remdesivir  for COVID-19 infection/pneumonia.  Patient is requiring heated high flow up to 50 L/min at 100% FiO2. Actemra  ordered on 1/7.   Tussionex prescription and prednisone    Feels breathing is somewhat improved from yesterday.

## 2023-04-20 NOTE — Plan of Care (Signed)
  Problem: Education: Goal: Knowledge of risk factors and measures for prevention of condition will improve Outcome: Progressing   Problem: Coping: Goal: Psychosocial and spiritual needs will be supported Outcome: Progressing   Problem: Education: Goal: Knowledge of General Education information will improve Description: Including pain rating scale, medication(s)/side effects and non-pharmacologic comfort measures Outcome: Progressing   Problem: Health Behavior/Discharge Planning: Goal: Ability to manage health-related needs will improve Outcome: Progressing   Problem: Clinical Measurements: Goal: Ability to maintain clinical measurements within normal limits will improve Outcome: Progressing   Problem: Clinical Measurements: Goal: Will remain free from infection Outcome: Progressing   Problem: Clinical Measurements: Goal: Diagnostic test results will improve Outcome: Progressing   Problem: Clinical Measurements: Goal: Cardiovascular complication will be avoided Outcome: Progressing   Problem: Nutrition: Goal: Adequate nutrition will be maintained Outcome: Progressing   Problem: Coping: Goal: Level of anxiety will decrease Outcome: Progressing   Problem: Elimination: Goal: Will not experience complications related to bowel motility Outcome: Progressing Goal: Will not experience complications related to urinary retention Outcome: Progressing   Problem: Pain Management: Goal: General experience of comfort will improve Outcome: Progressing   Problem: Safety: Goal: Ability to remain free from injury will improve Outcome: Progressing   Problem: Skin Integrity: Goal: Risk for impaired skin integrity will decrease Outcome: Progressing

## 2023-04-20 NOTE — Progress Notes (Signed)
 PROGRESS NOTE    Melanie Armstrong  FMW:[id number] DOB: 1955-10-04 DOA: 04/19/2023 PCP: Arloa Elsie SAUNDERS, MD   Brief Narrative: Melanie Armstrong is a 68 y.o. female with a history of hypertension, asthma, arthritis, obesity.  Patient presented secondary to shortness of breath, coughing, wheezing was found to have severe hypoxia.  Workup significant for COVID-19 positivity.  CTA chest significant for multifocal infiltrates concerning for multifocal pneumonia.  Patient started empirically on ceftriaxone  and azithromycin .  Patient was started on steroids and remdesivir  for COVID-19 infection/pneumonia.  Patient is requiring heated high flow up to 50 L/min at 100% FiO2.   Assessment and Plan:  Sepsis Present on admission, secondary to COVID-19 infection and pneumonia. Procalcitonin negative. Leukopenia. Blood cultures (1/5) with no growth to date. -Follow-up blood culture data -Continue to treat COVID-19 infection/pneumonia  Pneumonia secondary to COVID-19 Present on admission. CTA chest significant for multifocal infiltrates. Patient started on Remdesivir  and steroids, in addition to empiric antibiotic therapy with Ceftriaxone  and azithromycin . -Continue Remdesivir  and steroids -Continue Ceftriaxone  and azithromycin  -Lasix  -Continue solu-medrol /prednisone  -Daily CRP  Acute respiratory failure with hypoxia Secondary to pneumonia. Severe. CTA chest negative for an acute PE. Requiring up to HHFNC, 50 L/min at 100% FiO2. -Continue oxygen supplementation -Prone as able -Wean to room air as able -Incentive spirometer  Severe persistent asthma exacerbation Secondary to COVID-19 with pneumonia. Patient started on steroids. -Continue albuterol , Brovana , Incruse Ellipta , Pulmicort , Claritin  -Resume home Singulair   Hypokalemia Potassium as low as 2.8. Improved with supplementation.  Primary hypertension -Continue lisinopril   Anxiety -Continue diazepam  PRN  GERD -Continue  Pepcid    DVT prophylaxis: Lovenox  Code Status:   Code Status: Full Code Family Communication: Daughter at bedside Disposition Plan: Discharge home likely not for several days, pending ability to wean oxygen to/near room air   Consultants:  None  Procedures:  None  Antimicrobials: Ceftriaxone  Azithromycin  Remdesivir     Subjective: Patient reports some dyspnea at rest and on exertion. Mildly productive cough.  Objective: BP 130/62 (BP Location: Right Arm)   Pulse 76   Temp 98.3 F (36.8 C) (Oral)   Resp 20   Ht 5' 3 (1.6 m)   Wt 92 kg   SpO2 90%   BMI 35.93 kg/m   Examination:  General exam: Appears calm and comfortable Respiratory system: Diminished. Respiratory effort normal. Cardiovascular system: S1 & S2 heard, RRR. Gastrointestinal system: Abdomen is nondistended, soft and nontender. Normal bowel sounds heard. Central nervous system: Alert and oriented. No focal neurological deficits. Psychiatry: Judgement and insight appear normal. Mood & affect appropriate.    Data Reviewed: I have personally reviewed following labs and imaging studies  CBC Lab Results  Component Value Date   WBC 2.6 (L) 04/20/2023   RBC 4.29 04/20/2023   HGB 13.5 04/20/2023   HCT 40.3 04/20/2023   MCV 93.9 04/20/2023   MCH 31.5 04/20/2023   PLT 215 04/20/2023   MCHC 33.5 04/20/2023   RDW 12.1 04/20/2023   LYMPHSABS 2.1 07/29/2016   EOSABS 0.1 07/29/2016   BASOSABS 0.0 07/29/2016     Last metabolic panel Lab Results  Component Value Date   NA 138 04/20/2023   K 3.8 04/20/2023   CL 106 04/20/2023   CO2 24 04/20/2023   BUN 16 04/20/2023   CREATININE 0.71 04/20/2023   GLUCOSE 249 (H) 04/20/2023   GFRNONAA >60 04/20/2023   GFRAA >60 08/23/2014   CALCIUM  8.6 (L) 04/20/2023   PHOS 2.5 04/19/2023   PROT 5.7 (L) 04/19/2023  ALBUMIN 2.7 (L) 04/19/2023   BILITOT 0.9 04/19/2023   ALKPHOS 63 04/19/2023   AST 41 04/19/2023   ALT 36 04/19/2023   ANIONGAP 8 04/20/2023     GFR: Estimated Creatinine Clearance: 72.5 mL/min (by C-G formula based on SCr of 0.71 mg/dL).  Recent Results (from the past 240 hours)  Blood culture (routine x 2)     Status: None (Preliminary result)   Collection Time: 04/19/23  9:02 AM   Specimen: BLOOD LEFT HAND  Result Value Ref Range Status   Specimen Description BLOOD LEFT HAND  Final   Special Requests   Final    BOTTLES DRAWN AEROBIC AND ANAEROBIC Blood Culture results may not be optimal due to an inadequate volume of blood received in culture bottles   Culture   Final    NO GROWTH < 24 HOURS Performed at [facility], [address] [place], [place] [address]    Report Status PENDING  Incomplete  Resp panel by RT-PCR (RSV, Flu A&B, Covid) Anterior Nasal Swab     Status: Abnormal   Collection Time: 04/19/23  9:10 AM   Specimen: Anterior Nasal Swab  Result Value Ref Range Status   SARS Coronavirus 2 by RT PCR POSITIVE (A) NEGATIVE Final   Influenza A by PCR NEGATIVE NEGATIVE Final   Influenza B by PCR NEGATIVE NEGATIVE Final    Comment: (NOTE) The Xpert Xpress SARS-CoV-2/FLU/RSV plus assay is intended as an aid in the diagnosis of influenza from Nasopharyngeal swab specimens and should not be used as a sole basis for treatment. Nasal washings and aspirates are unacceptable for Xpert Xpress SARS-CoV-2/FLU/RSV testing.  Fact Sheet for Patients: bloggercourse.com  Fact Sheet for Healthcare Providers: seriousbroker.it  This test is not yet approved or cleared by the [place]  FDA and has been authorized for detection and/or diagnosis of SARS-CoV-2 by FDA under an Emergency Use Authorization (EUA). This EUA will remain in effect (meaning this test can be used) for the duration of the COVID-19 declaration under Section 564(b)(1) of the Act, 21 U.S.C. section 360bbb-3(b)(1), unless the authorization is terminated or revoked.     Resp Syncytial Virus by  PCR NEGATIVE NEGATIVE Final    Comment: (NOTE) Fact Sheet for Patients: bloggercourse.com  Fact Sheet for Healthcare Providers: seriousbroker.it  This test is not yet approved or cleared by the [place]  FDA and has been authorized for detection and/or diagnosis of SARS-CoV-2 by FDA under an Emergency Use Authorization (EUA). This EUA will remain in effect (meaning this test can be used) for the duration of the COVID-19 declaration under Section 564(b)(1) of the Act, 21 U.S.C. section 360bbb-3(b)(1), unless the authorization is terminated or revoked.  Performed at [facility], [address], [place], [place] [address]   Blood culture (routine x 2)     Status: None (Preliminary result)   Collection Time: 04/19/23  9:10 AM   Specimen: BLOOD RIGHT WRIST  Result Value Ref Range Status   Specimen Description BLOOD RIGHT WRIST  Final   Special Requests   Final    BOTTLES DRAWN AEROBIC AND ANAEROBIC Blood Culture adequate volume   Culture   Final    NO GROWTH < 24 HOURS Performed at [facility], [address] [place], [place] [address]    Report Status PENDING  Incomplete  MRSA Next Gen by PCR, Nasal     Status: None   Collection Time: 04/19/23 11:34 AM   Specimen: Nasal Mucosa; Nasal Swab  Result Value Ref Range Status   MRSA by PCR Next Gen NOT DETECTED NOT DETECTED Final    Comment: (NOTE) The GeneXpert MRSA Assay (FDA approved for NASAL specimens only), is one component of a comprehensive MRSA colonization surveillance program. It is not intended to diagnose MRSA infection nor to guide or monitor treatment for MRSA infections. Test performance is not FDA approved in patients less than 18 years old. Performed at [facility], [address], [place], [place] [address]       Radiology Studies: CT Angio Chest Pulmonary Embolism (PE) W or WO Contrast Result Date: 04/19/2023 CLINICAL DATA:  Positive  D-dimer EXAM: CT ANGIOGRAPHY CHEST WITH CONTRAST TECHNIQUE: Multidetector CT imaging of the chest was performed using the standard protocol during bolus administration of intravenous contrast. Multiplanar CT image reconstructions and MIPs were obtained to evaluate the vascular anatomy. RADIATION DOSE REDUCTION: This exam was performed according to the departmental dose-optimization program which includes automated exposure control, adjustment of the mA and/or kV according to patient size and/or use of iterative reconstruction technique. CONTRAST:  59mL OMNIPAQUE  IOHEXOL  350 MG/ML SOLN COMPARISON:  Chest x-ray from earlier in the same day, chest CT from 12/18/2021. FINDINGS: Cardiovascular: Atherosclerotic calcifications of the thoracic aorta are noted. No aneurysmal dilatation is seen. Pulmonary artery shows a normal branching pattern bilaterally. No filling defect to suggest pulmonary embolism is noted. Heavy coronary calcifications are noted. No cardiac enlargement is seen. Mediastinum/Nodes: Thoracic inlet is within normal limits. No hilar or mediastinal adenopathy is noted. The esophagus as visualized is within normal limits. Lungs/Pleura: Lungs show diffuse bilateral infiltrates worsened when compared with the prior exam from earlier in the same day. Large fat containing lesion is noted within the left lower lobe measuring 19 mm stable in appearance from the prior exam. The smaller pleural based density in the posterior left lower lobe inferiorly is less well visualized due to the surrounding inflammatory changes. No sizable effusion is noted. No other parenchymal nodule is seen. Upper Abdomen: Small hiatal hernia is noted. Gallbladder has been surgically removed. Musculoskeletal: Degenerative changes of the thoracic spine are noted. No compression deformity is seen. Review of the MIP images confirms the above findings. IMPRESSION: No evidence of pulmonary emboli. Diffuse increasing bilateral airspace opacities  consistent with worsening multifocal pneumonia. Fat containing left lower lobe nodule consistent with a hamartoma stable from the prior exam. Other previously seen nodules are less well appreciated due to the underlying inflammatory change. Aortic Atherosclerosis (ICD10-I70.0). Electronically Signed   By: Oneil Devonshire M.D.   On: 04/19/2023 17:32   DG Chest Port 1 View Result Date: 04/19/2023 CLINICAL DATA:  68 year old female with history of shortness of breath, cough and fever. EXAM: PORTABLE CHEST 1 VIEW COMPARISON:  Chest x-ray 08/14/2017. FINDINGS: Diffuse interstitial prominence and peribronchial cuffing noted in the lungs bilaterally suggesting a background of bronchitis. Patchy ill-defined opacities are noted throughout the left mid to lower lung, concerning for bronchopneumonia. Probable small left pleural effusion. No definite right pleural effusion. No pneumothorax. No evidence of pulmonary edema. Heart size is borderline enlarged. The patient is rotated to the left on today's exam, resulting in distortion of the mediastinal contours and reduced diagnostic sensitivity and specificity for mediastinal pathology. IMPRESSION: 1. The appearance of the chest is most compatible with bronchitis with probable left-sided bronchopneumonia. Followup PA and lateral chest X-ray is recommended in 3-4 weeks following trial of antibiotic therapy to ensure resolution and exclude underlying malignancy. Electronically Signed   By: Toribio  Entrikin M.D.   On: 04/19/2023 09:51      LOS: 1 day    Elgin Lam, MD [facility] 04/20/2023, 1:15 PM   If 7PM-7AM, please contact night-coverage [email or web address]

## 2023-04-21 ENCOUNTER — Telehealth: Payer: Self-pay | Admitting: Internal Medicine

## 2023-04-21 DIAGNOSIS — U071 COVID-19: Secondary | ICD-10-CM | POA: Diagnosis not present

## 2023-04-21 DIAGNOSIS — A419 Sepsis, unspecified organism: Secondary | ICD-10-CM | POA: Diagnosis not present

## 2023-04-21 DIAGNOSIS — I10 Essential (primary) hypertension: Secondary | ICD-10-CM | POA: Diagnosis not present

## 2023-04-21 DIAGNOSIS — J45901 Unspecified asthma with (acute) exacerbation: Secondary | ICD-10-CM | POA: Diagnosis not present

## 2023-04-21 LAB — CBC
HCT: 40.2 % (ref 36.0–46.0)
Hemoglobin: 13.6 g/dL (ref 12.0–15.0)
MCH: 31.5 pg (ref 26.0–34.0)
MCHC: 33.8 g/dL (ref 30.0–36.0)
MCV: 93.1 fL (ref 80.0–100.0)
Platelets: 295 10*3/uL (ref 150–400)
RBC: 4.32 MIL/uL (ref 3.87–5.11)
RDW: 12 % (ref 11.5–15.5)
WBC: 11.1 10*3/uL — ABNORMAL HIGH (ref 4.0–10.5)
nRBC: 0 % (ref 0.0–0.2)

## 2023-04-21 LAB — COMPREHENSIVE METABOLIC PANEL
ALT: 46 U/L — ABNORMAL HIGH (ref 0–44)
AST: 28 U/L (ref 15–41)
Albumin: 2.2 g/dL — ABNORMAL LOW (ref 3.5–5.0)
Alkaline Phosphatase: 61 U/L (ref 38–126)
Anion gap: 12 (ref 5–15)
BUN: 20 mg/dL (ref 8–23)
CO2: 25 mmol/L (ref 22–32)
Calcium: 9.2 mg/dL (ref 8.9–10.3)
Chloride: 103 mmol/L (ref 98–111)
Creatinine, Ser: 0.76 mg/dL (ref 0.44–1.00)
GFR, Estimated: >60 mL/min (ref >60)
Glucose, Bld: 180 mg/dL — ABNORMAL HIGH (ref 70–99)
Potassium: 3.4 mmol/L — ABNORMAL LOW (ref 3.5–5.1)
Sodium: 140 mmol/L (ref 135–145)
Total Bilirubin: 0.7 mg/dL (ref 0.0–1.2)
Total Protein: 5.5 g/dL — ABNORMAL LOW (ref 6.5–8.1)

## 2023-04-21 LAB — C-REACTIVE PROTEIN: CRP: 7.4 mg/dL — ABNORMAL HIGH (ref <1.0)

## 2023-04-21 MED ORDER — HYDROCOD POLI-CHLORPHE POLI ER 10-8 MG/5ML PO SUER
5.0000 mL | Freq: Every evening | ORAL | Status: DC | PRN
  Administered 2023-04-21 – 2023-04-29 (×10): 5 mL via ORAL
  Filled 2023-04-21 (×10): qty 5

## 2023-04-21 MED ORDER — TOCILIZUMAB 400 MG/20ML IV SOLN
750.0000 mg | Freq: Once | INTRAVENOUS | Status: AC
  Administered 2023-04-21: 750 mg via INTRAVENOUS
  Filled 2023-04-21: qty 37.5

## 2023-04-21 MED ORDER — POTASSIUM CHLORIDE CRYS ER 20 MEQ PO TBCR
40.0000 meq | EXTENDED_RELEASE_TABLET | Freq: Once | ORAL | Status: AC
  Administered 2023-04-21: 40 meq via ORAL
  Filled 2023-04-21: qty 2

## 2023-04-21 MED ORDER — TOCILIZUMAB-AAZG 400 MG/20ML IV SOLN
750.0000 mg | Freq: Once | INTRAVENOUS | Status: DC
  Filled 2023-04-21: qty 37.5

## 2023-04-21 MED ORDER — METHYLPREDNISOLONE SODIUM SUCC 125 MG IJ SOLR
INTRAMUSCULAR | Status: AC
  Administered 2023-04-21: 125 mg
  Filled 2023-04-21: qty 2

## 2023-04-21 MED ORDER — ALBUTEROL SULFATE (2.5 MG/3ML) 0.083% IN NEBU
2.5000 mg | INHALATION_SOLUTION | Freq: Four times a day (QID) | RESPIRATORY_TRACT | Status: DC
  Administered 2023-04-21 – 2023-04-28 (×26): 2.5 mg via RESPIRATORY_TRACT
  Filled 2023-04-21 (×26): qty 3

## 2023-04-21 NOTE — Telephone Encounter (Signed)
 PT's daughter (DPR) calling. States mom is in hospital w/Covid and pneumonia. Would like Dr. Marchelle Gearing to stop by her room.  Melanie Armstrong in 2 Central Room 7  Admitted on Last Sunday.    Daughter # is [phone] [name]

## 2023-04-21 NOTE — Telephone Encounter (Signed)
   RE Melanie Armstrong I d/w Hospitalist Dr Briana =-patient has COVID-related pneumonia and respiratory failure requiring oxygen.  He says at this point in time patient is on maximal oxygen therapy.  Is also gotten the appropriate COVID-19 medications that I personally reviewed with Dr. Briana.  I recommended BiPAP at night which he is started.  If the patient is getting any worse they will call the pulmonary services at the hospital to transfer to the ICU.  The hospitalist that a very good and they will take good care of the patient.  But the situation is that it is COVID and that doing all the treatments possible to prevent her from getting worse.  I am in the office rotation and I am unable to come to the hospital as much as I would wish to.  I agree that as of today there is probably no need for a pulmonary consult

## 2023-04-21 NOTE — Progress Notes (Signed)
 PROGRESS NOTE    Melanie Armstrong  FMW:[id number] DOB: 10-Sep-1955 DOA: 04/19/2023 PCP: Arloa Elsie SAUNDERS, MD   Brief Narrative: AMRA SHUKLA is a 68 y.o. female with a history of hypertension, asthma, arthritis, obesity.  Patient presented secondary to shortness of breath, coughing, wheezing was found to have severe hypoxia.  Workup significant for COVID-19 positivity.  CTA chest significant for multifocal infiltrates concerning for multifocal pneumonia.  Patient started empirically on ceftriaxone  and azithromycin .  Patient was started on steroids and remdesivir  for COVID-19 infection/pneumonia.  Patient is requiring heated high flow up to 50 L/min at 100% FiO2. Actemra  ordered on 1/7.   Assessment and Plan:  Sepsis Present on admission, secondary to COVID-19 infection and pneumonia. Procalcitonin negative. Leukopenia. Blood cultures (1/5) with no growth to date. -Follow-up blood culture data -Continue to treat COVID-19 infection/pneumonia  Pneumonia secondary to COVID-19 Present on admission. CTA chest significant for multifocal infiltrates. Patient started on Remdesivir  and steroids, in addition to empiric antibiotic therapy with Ceftriaxone  and azithromycin . CRP trending down. -Continue Remdesivir  and steroids -Actemra  IV x1; confirmed patient is not on chronic prednisone  -Continue Ceftriaxone  and azithromycin  -Lasix  -Continue solu-medrol /prednisone  -Daily CRP -Tussionex at bedtime prn  Acute respiratory failure with hypoxia Secondary to pneumonia. Severe. CTA chest negative for an acute PE. Requiring up to HHFNC, 50 L/min at 100% FiO2. -Continue oxygen supplementation -Prone as able -Wean to room air as able -Incentive spirometer  Severe persistent asthma exacerbation Secondary to COVID-19 with pneumonia. Patient started on steroids. -Continue albuterol , Brovana , Incruse Ellipta , Pulmicort , Claritin , Singulair   Hypokalemia Potassium as low as 2.8. Improved with  supplementation. 3.4 today. -Potassium supplementation  Primary hypertension -Continue lisinopril   Anxiety -Continue diazepam  PRN  GERD -Continue Pepcid    DVT prophylaxis: Lovenox  Code Status:   Code Status: Full Code Family Communication: Daughter on telephone. Friend at bedside Disposition Plan: Discharge home likely not for several days, pending ability to wean oxygen to/near room air   Consultants:  None  Procedures:  None  Antimicrobials: Ceftriaxone  Azithromycin  Remdesivir     Subjective: Continued dyspnea and cough. Poor sleeping secondary to cough  Objective: BP (!) 158/75   Pulse 78   Temp 97.8 F (36.6 C) (Axillary)   Resp (!) 22   Ht 5' 3 (1.6 m)   Wt 93.5 kg   SpO2 90%   BMI 36.51 kg/m   Examination:  General exam: Appears calm and comfortable Respiratory system: Diminished breath sounds. Respiratory effort normal. Cardiovascular system: S1 & S2 heard, RRR. Gastrointestinal system: Abdomen is nondistended, soft and nontender. Normal bowel sounds heard. Central nervous system: Alert and oriented. No focal neurological deficits. Musculoskeletal: No edema. No calf tenderness Skin: No cyanosis. No rashes Psychiatry: Judgement and insight appear normal. Mood & affect appropriate.    Data Reviewed: I have personally reviewed following labs and imaging studies  CBC Lab Results  Component Value Date   WBC 11.1 (H) 04/21/2023   RBC 4.32 04/21/2023   HGB 13.6 04/21/2023   HCT 40.2 04/21/2023   MCV 93.1 04/21/2023   MCH 31.5 04/21/2023   PLT 295 04/21/2023   MCHC 33.8 04/21/2023   RDW 12.0 04/21/2023   LYMPHSABS 2.1 07/29/2016   EOSABS 0.1 07/29/2016   BASOSABS 0.0 07/29/2016     Last metabolic panel Lab Results  Component Value Date   NA 140 04/21/2023   K 3.4 (L) 04/21/2023   CL 103 04/21/2023   CO2 25 04/21/2023   BUN 20 04/21/2023   CREATININE 0.76 04/21/2023  GLUCOSE 180 (H) 04/21/2023   GFRNONAA >60 04/21/2023   GFRAA  >60 08/23/2014   CALCIUM  9.2 04/21/2023   PHOS 2.5 04/19/2023   PROT 5.5 (L) 04/21/2023   ALBUMIN 2.2 (L) 04/21/2023   BILITOT 0.7 04/21/2023   ALKPHOS 61 04/21/2023   AST 28 04/21/2023   ALT 46 (H) 04/21/2023   ANIONGAP 12 04/21/2023    GFR: Estimated Creatinine Clearance: 73.1 mL/min (by C-G formula based on SCr of 0.76 mg/dL).  Recent Results (from the past 240 hours)  Blood culture (routine x 2)     Status: None (Preliminary result)   Collection Time: 04/19/23  9:02 AM   Specimen: BLOOD LEFT HAND  Result Value Ref Range Status   Specimen Description BLOOD LEFT HAND  Final   Special Requests   Final    BOTTLES DRAWN AEROBIC AND ANAEROBIC Blood Culture results may not be optimal due to an inadequate volume of blood received in culture bottles   Culture   Final    NO GROWTH 2 DAYS Performed at [facility], [address], [place], [place] [address]    Report Status PENDING  Incomplete  Resp panel by RT-PCR (RSV, Flu A&B, Covid) Anterior Nasal Swab     Status: Abnormal   Collection Time: 04/19/23  9:10 AM   Specimen: Anterior Nasal Swab  Result Value Ref Range Status   SARS Coronavirus 2 by RT PCR POSITIVE (A) NEGATIVE Final   Influenza A by PCR NEGATIVE NEGATIVE Final   Influenza B by PCR NEGATIVE NEGATIVE Final    Comment: (NOTE) The Xpert Xpress SARS-CoV-2/FLU/RSV plus assay is intended as an aid in the diagnosis of influenza from Nasopharyngeal swab specimens and should not be used as a sole basis for treatment. Nasal washings and aspirates are unacceptable for Xpert Xpress SARS-CoV-2/FLU/RSV testing.  Fact Sheet for Patients: bloggercourse.com  Fact Sheet for Healthcare Providers: seriousbroker.it  This test is not yet approved or cleared by the [place]  FDA and has been authorized for detection and/or diagnosis of SARS-CoV-2 by FDA under an Emergency Use Authorization (EUA). This EUA will remain in  effect (meaning this test can be used) for the duration of the COVID-19 declaration under Section 564(b)(1) of the Act, 21 U.S.C. section 360bbb-3(b)(1), unless the authorization is terminated or revoked.     Resp Syncytial Virus by PCR NEGATIVE NEGATIVE Final    Comment: (NOTE) Fact Sheet for Patients: bloggercourse.com  Fact Sheet for Healthcare Providers: seriousbroker.it  This test is not yet approved or cleared by the [place]  FDA and has been authorized for detection and/or diagnosis of SARS-CoV-2 by FDA under an Emergency Use Authorization (EUA). This EUA will remain in effect (meaning this test can be used) for the duration of the COVID-19 declaration under Section 564(b)(1) of the Act, 21 U.S.C. section 360bbb-3(b)(1), unless the authorization is terminated or revoked.  Performed at [facility], [address] [place], [place] [address]   Blood culture (routine x 2)     Status: None (Preliminary result)   Collection Time: 04/19/23  9:10 AM   Specimen: BLOOD RIGHT WRIST  Result Value Ref Range Status   Specimen Description BLOOD RIGHT WRIST  Final   Special Requests   Final    BOTTLES DRAWN AEROBIC AND ANAEROBIC Blood Culture adequate volume   Culture   Final    NO GROWTH 2 DAYS Performed at [facility], [address] [place], [place] [address]    Report  Status PENDING  Incomplete  MRSA Next Gen by PCR, Nasal     Status: None   Collection Time: 04/19/23 11:34 AM   Specimen: Nasal Mucosa; Nasal Swab  Result Value Ref Range Status   MRSA by PCR Next Gen NOT DETECTED NOT DETECTED Final    Comment: (NOTE) The GeneXpert MRSA Assay (FDA approved for NASAL specimens only), is one component of a comprehensive MRSA colonization surveillance program. It is not intended to diagnose MRSA infection nor to guide or monitor treatment for MRSA infections. Test performance is not FDA approved in patients less  than 44 years old. Performed at [facility], [address] [place], [place] [address]       Radiology Studies: CT Angio Chest Pulmonary Embolism (PE) W or WO Contrast Result Date: 04/19/2023 CLINICAL DATA:  Positive D-dimer EXAM: CT ANGIOGRAPHY CHEST WITH CONTRAST TECHNIQUE: Multidetector CT imaging of the chest was performed using the standard protocol during bolus administration of intravenous contrast. Multiplanar CT image reconstructions and MIPs were obtained to evaluate the vascular anatomy. RADIATION DOSE REDUCTION: This exam was performed according to the departmental dose-optimization program which includes automated exposure control, adjustment of the mA and/or kV according to patient size and/or use of iterative reconstruction technique. CONTRAST:  59mL OMNIPAQUE  IOHEXOL  350 MG/ML SOLN COMPARISON:  Chest x-ray from earlier in the same day, chest CT from 12/18/2021. FINDINGS: Cardiovascular: Atherosclerotic calcifications of the thoracic aorta are noted. No aneurysmal dilatation is seen. Pulmonary artery shows a normal branching pattern bilaterally. No filling defect to suggest pulmonary embolism is noted. Heavy coronary calcifications are noted. No cardiac enlargement is seen. Mediastinum/Nodes: Thoracic inlet is within normal limits. No hilar or mediastinal adenopathy is noted. The esophagus as visualized is within normal limits. Lungs/Pleura: Lungs show diffuse bilateral infiltrates worsened when compared with the prior exam from earlier in the same day. Large fat containing lesion is noted within the left lower lobe measuring 19 mm stable in appearance from the prior exam. The smaller pleural based density in the posterior left lower lobe inferiorly is less well visualized due to the surrounding inflammatory changes. No sizable effusion is noted. No other parenchymal nodule is seen. Upper Abdomen: Small hiatal hernia is noted. Gallbladder has been surgically removed. Musculoskeletal:  Degenerative changes of the thoracic spine are noted. No compression deformity is seen. Review of the MIP images confirms the above findings. IMPRESSION: No evidence of pulmonary emboli. Diffuse increasing bilateral airspace opacities consistent with worsening multifocal pneumonia. Fat containing left lower lobe nodule consistent with a hamartoma stable from the prior exam. Other previously seen nodules are less well appreciated due to the underlying inflammatory change. Aortic Atherosclerosis (ICD10-I70.0). Electronically Signed   By: Oneil Devonshire M.D.   On: 04/19/2023 17:32      LOS: 2 days    Elgin Lam, MD [facility] 04/21/2023, 11:16 AM   If 7PM-7AM, please contact night-coverage [email or web address]

## 2023-04-21 NOTE — Telephone Encounter (Addendum)
 Called and spoke with patients daughter Josette.  She would like to speak with Dr. Geronimo concerning her mothers care while she in in the hospital.    Informed patients daughter will send message to Dr. Geronimo.  Also recommended patient speak with her nurse and provider at the hospital and express concerns and ask if nurse could get a message to Dr. Geronimo in [facility].  Patient daughter verbalized understanding.  Dr. Geronimo, just [facility].

## 2023-04-21 NOTE — Plan of Care (Signed)
   Problem: Nutrition: Goal: Adequate nutrition will be maintained Outcome: Progressing   Problem: Coping: Goal: Level of anxiety will decrease Outcome: Progressing   Problem: Elimination: Goal: Will not experience complications related to bowel motility Outcome: Progressing Goal: Will not experience complications related to urinary retention Outcome: Progressing

## 2023-04-21 NOTE — Plan of Care (Signed)

## 2023-04-22 ENCOUNTER — Other Ambulatory Visit (HOSPITAL_COMMUNITY): Payer: Self-pay

## 2023-04-22 DIAGNOSIS — J1282 Pneumonia due to coronavirus disease 2019: Secondary | ICD-10-CM | POA: Diagnosis not present

## 2023-04-22 DIAGNOSIS — U071 COVID-19: Secondary | ICD-10-CM | POA: Diagnosis not present

## 2023-04-22 LAB — COMPREHENSIVE METABOLIC PANEL
ALT: 44 U/L (ref 0–44)
AST: 24 U/L (ref 15–41)
Albumin: 2.3 g/dL — ABNORMAL LOW (ref 3.5–5.0)
Alkaline Phosphatase: 60 U/L (ref 38–126)
Anion gap: 10 (ref 5–15)
BUN: 24 mg/dL — ABNORMAL HIGH (ref 8–23)
CO2: 26 mmol/L (ref 22–32)
Calcium: 9.1 mg/dL (ref 8.9–10.3)
Chloride: 100 mmol/L (ref 98–111)
Creatinine, Ser: 0.67 mg/dL (ref 0.44–1.00)
GFR, Estimated: >60 mL/min (ref >60)
Glucose, Bld: 181 mg/dL — ABNORMAL HIGH (ref 70–99)
Potassium: 4.4 mmol/L (ref 3.5–5.1)
Sodium: 136 mmol/L (ref 135–145)
Total Bilirubin: 0.7 mg/dL (ref 0.0–1.2)
Total Protein: 5.6 g/dL — ABNORMAL LOW (ref 6.5–8.1)

## 2023-04-22 LAB — CBC
HCT: 41.4 % (ref 36.0–46.0)
Hemoglobin: 13.9 g/dL (ref 12.0–15.0)
MCH: 31.2 pg (ref 26.0–34.0)
MCHC: 33.6 g/dL (ref 30.0–36.0)
MCV: 92.8 fL (ref 80.0–100.0)
Platelets: 303 10*3/uL (ref 150–400)
RBC: 4.46 MIL/uL (ref 3.87–5.11)
RDW: 12 % (ref 11.5–15.5)
WBC: 13 10*3/uL — ABNORMAL HIGH (ref 4.0–10.5)
nRBC: 0 % (ref 0.0–0.2)

## 2023-04-22 LAB — C-REACTIVE PROTEIN: CRP: 4.2 mg/dL — ABNORMAL HIGH (ref <1.0)

## 2023-04-22 MED ORDER — TOCILIZUMAB 400 MG/20ML IV SOLN
750.0000 mg | Freq: Once | INTRAVENOUS | Status: AC
  Administered 2023-04-22: 750 mg via INTRAVENOUS
  Filled 2023-04-22: qty 37.5

## 2023-04-22 MED ORDER — BENZONATATE 100 MG PO CAPS
100.0000 mg | ORAL_CAPSULE | Freq: Three times a day (TID) | ORAL | Status: DC
  Administered 2023-04-22 – 2023-04-30 (×24): 100 mg via ORAL
  Filled 2023-04-22 (×24): qty 1

## 2023-04-22 MED ORDER — SODIUM CHLORIDE 0.9 % IV SOLN: 100.0000 mg | Freq: Every day | INTRAVENOUS | Status: DC

## 2023-04-22 MED ORDER — METHYLPREDNISOLONE SODIUM SUCC 125 MG IJ SOLR
80.0000 mg | Freq: Every day | INTRAMUSCULAR | Status: DC
  Administered 2023-04-22 – 2023-04-29 (×8): 80 mg via INTRAVENOUS
  Filled 2023-04-22 (×9): qty 2

## 2023-04-22 NOTE — Progress Notes (Signed)
 PROGRESS NOTE  Melanie Armstrong  DOB: 1956-03-24  PCP: Arloa Elsie SAUNDERS, MD FMW:[id number]  DOA: 04/19/2023  LOS: 3 days  Hospital Day: 4  Brief narrative: Melanie Armstrong is a 68 y.o. female with PMH significant for obesity, HTN, GERD, arthritis, asthma 1/5, patient presented to the ED with complaint of shortness of breath, cough, wheezing worsening over a period of week, not responding to clarithromycin  as an outpatient  In the ED, she had a fever of 101.2.  She was severely hypoxic to 70s on room air and required O2 at 10 L by nasal cannula. WBC count normal, lactic acid 1.4, potassium low at 2.9, CRP elevated to 13, procalcitonin negative COVID-19 PCR was positive. CTA chest negative for PE but significant for multifocal infiltrates concerning for multifocal pneumonia.  Patient started empirically on IV remdesivir , IV steroids ceftriaxone  and azithromycin .  Admitted to [facility] Patient's respiratory status continued to worsen and required heated high flow up to 50 L/min at 100% FiO2.  1/7 Actemra  was ordered as well.   Subjective: Patient was seen and examined this morning.  Pleasant elderly Caucasian female.  Sitting up at the edge of the bed.  On high flow oxygen.  Daughter at bedside. Feels better than yesterday.  Daughter reports that she feels her oxygenation improved with active diastolic. Chart reviewed.   In the last 24 hours, no fever, heart rate in 70s, blood pressure mostly elevated to 150s, requiring 15 L high flow oxygen Last set of blood work from this morning with WBC count 13, CRP improving  Assessment and plan: Sepsis POA COVID pneumonia Acute respiratory failure with hypoxia  Presented with worsening shortness of breath, cough for a week COVID PCR positive CTA chest significant for multifocal infiltrates concerning for multifocal pneumonia.  Treatment: Initially started on IV remdesivir , IV steroids.  With worsening hypoxia, Actemra  was given on 1/7. Discussed with  pharmacy.  1 more dose of Actemra  to be given today. I would continue Solu-Medrol  IV for now. Also getting oral Lasix  40 mg daily.  Monitor creatinine. Currently requiring 50 L heated high flow oxygen.  Wean down as tolerated Continue supportive care with scheduled Tessalon  Perles, as needed Robitussin Tylenol , incentive spirometry WBC uptrending due to steroids.  Procalcitonin not elevated, CRP seems to be improving Blood culture without any growth. Currently also on empiric antibiotic with ceftriaxone  and azithromycin .  Given the clinical severity, okay to complete 5-day empiric course.  QTc 446 ms on 9/5 Recent Labs  Lab 04/19/23 0910 04/19/23 0912 04/19/23 0923 04/19/23 1125 04/20/23 0208 04/20/23 0859 04/21/23 0228 04/22/23 0235  SARSCOV2NAA POSITIVE*  --   --   --   --   --   --   --   WBC  --  7.5  --   --  2.6*  --  11.1* 13.0*  LATICACIDVEN  --   --  1.4  --   --   --   --   --   PROCALCITON  --   --   --  <0.10  --   --   --   --   DDIMER  --   --   --  1.19*  --   --   --   --   CRP  --   --   --   --   --  13.2* 7.4* 4.2*  ALT  --  36  --   --   --   --  46* 44  Severe persistent asthma exacerbation Secondary to COVID-19 with pneumonia. Steroids as above Continue bronchodilators.:  Singulair , Claritin , Pulmicort , Brovana , Incruse Ellipta , albuterol    Severe hypokalemia Improved with replacement.  Potassium normal this morning. Recent Labs  Lab 04/19/23 0912 04/19/23 1117 04/19/23 1125 04/20/23 0208 04/21/23 0228 04/22/23 0235  K 2.9* 2.8*  --  3.8 3.4* 4.4  MG  --   --  2.2  --   --   --   PHOS  --   --  2.5  --   --   --    Primary hypertension PTA meds- lisinopril  40 mg daily I will hold it while patient is getting Lasix  daily.   Anxiety Continue diazepam  PRN   GERD Continue Pepcid    Mobility: Encourage ambulation when able to  Goals of care   Code Status: Full Code     DVT prophylaxis:  enoxaparin  (LOVENOX ) injection 40 mg Start: 04/19/23  2000   Antimicrobials: IV remdesivir , IV Rocephin , azithromycin  Fluid: None Consultants: None  Family Communication: Patient's daughter Ms. Christine at bedside  Status: Inpatient Level of care:  Progressive   Patient is from: Home Needs to continue in-hospital care: Severely hypoxic Anticipated d/c to: Pending clinical course      Diet:  Diet Order             Diet Heart Room service appropriate? Yes; Fluid consistency: Thin  Diet effective now                   Scheduled Meds:  albuterol   2.5 mg Nebulization Q6H   arformoterol   15 mcg Nebulization BID   ascorbic acid   500 mg Oral Daily   azithromycin   500 mg Oral Daily   benzonatate   100 mg Oral TID   betaxolol   1 drop Both Eyes BID   budesonide  (PULMICORT ) nebulizer solution  0.5 mg Nebulization BID   dorzolamide   1 drop Both Eyes BID   enoxaparin  (LOVENOX ) injection  40 mg Subcutaneous Q24H   famotidine   20 mg Oral BID   furosemide   40 mg Oral q morning   guaiFENesin   600 mg Oral BID   latanoprost   1 drop Both Eyes QHS   loratadine   10 mg Oral Daily   methylPREDNISolone  (SOLU-MEDROL ) injection  80 mg Intravenous Daily   montelukast   10 mg Oral QHS   multivitamin with minerals  1 tablet Oral Daily   sodium chloride  flush  3 mL Intravenous Q12H   umeclidinium bromide   1 puff Inhalation Daily    PRN meds: acetaminophen  **OR** acetaminophen , chlorpheniramine-HYDROcodone , diazepam , guaiFENesin -dextromethorphan    Infusions:   cefTRIAXone  (ROCEPHIN )  IV 2 g (04/22/23 0939)   tocilizumab  (ACTEMRA ) 750 mg in sodium chloride  0.9 % 100 mL infusion      Antimicrobials: Anti-infectives (From admission, onward)    Start     Dose/Rate Route Frequency Ordered Stop   04/22/23 1000  remdesivir  100 mg in sodium chloride  0.9 % 100 mL IVPB  Status:  Discontinued       Note to Pharmacy: Lets resume to complete a 5-day course   100 mg 200 mL/hr over 30 Minutes Intravenous Daily 04/22/23 0842 04/22/23 1111   04/20/23  1000  cefTRIAXone  (ROCEPHIN ) 1 g in sodium chloride  0.9 % 100 mL IVPB  Status:  Discontinued        1 g 200 mL/hr over 30 Minutes Intravenous Every 24 hours 04/19/23 1042 04/19/23 1114   04/20/23 1000  azithromycin  (ZITHROMAX ) tablet 500 mg  500 mg Oral Daily 04/19/23 1113 04/24/23 0959   04/20/23 1000  cefTRIAXone  (ROCEPHIN ) 2 g in sodium chloride  0.9 % 100 mL IVPB        2 g 200 mL/hr over 30 Minutes Intravenous Every 24 hours 04/19/23 1114     04/20/23 1000  remdesivir  100 mg in sodium chloride  0.9 % 100 mL IVPB       Placed in Followed by Linked Group   100 mg 200 mL/hr over 30 Minutes Intravenous Daily 04/19/23 1120 04/21/23 1131   04/19/23 1130  remdesivir  200 mg in sodium chloride  0.9% 250 mL IVPB       Placed in Followed by Linked Group   200 mg 580 mL/hr over 30 Minutes Intravenous Once 04/19/23 1120 04/19/23 1351   04/19/23 1115  cefTRIAXone  (ROCEPHIN ) 1 g in sodium chloride  0.9 % 100 mL IVPB        1 g 200 mL/hr over 30 Minutes Intravenous  Once 04/19/23 1114 04/19/23 1212   04/19/23 1015  cefTRIAXone  (ROCEPHIN ) 1 g in sodium chloride  0.9 % 100 mL IVPB        1 g 200 mL/hr over 30 Minutes Intravenous  Once 04/19/23 1000 04/19/23 1013   04/19/23 1015  azithromycin  (ZITHROMAX ) tablet 500 mg        500 mg Oral  Once 04/19/23 1000 04/19/23 1012       Objective: Vitals:   04/22/23 1004 04/22/23 1259  BP: 131/61 114/60  Pulse: 78 68  Resp: 15 14  Temp: 97.7 F (36.5 C) 97.9 F (36.6 C)  SpO2: 96% 96%    Intake/Output Summary (Last 24 hours) at 04/22/2023 1315 Last data filed at 04/22/2023 1259 Gross per 24 hour  Intake 330.96 ml  Output 1100 ml  Net -769.04 ml   Filed Weights   04/19/23 1732 04/20/23 0500 04/21/23 0757  Weight: 90.1 kg 92 kg 93.5 kg   Weight change:  Body mass index is 36.51 kg/m.   Physical Exam: General exam: Pleasant, elderly female. Skin: No rashes, lesions or ulcers. HEENT: Atraumatic, normocephalic, no obvious  bleeding Lungs: On heated high flow oxygen.  Clear to auscultation bilaterally CVS: S1, S2, no murmur,   GI/Abd: Soft, nontender, nondistended, bowel sound present,   CNS: Alert, awake, oriented x 3 Psychiatry: Mood appropriate, Extremities: No pedal edema, no calf tenderness,   Data Review: I have personally reviewed the laboratory data and studies available.  F/u labs  Unresulted Labs (From admission, onward)     Start     Ordered   04/21/23 0500  Comprehensive metabolic panel  Daily,   R     Question:  Specimen collection method  Answer:  Lab=Lab collect   04/20/23 1334   04/21/23 0500  CBC  Daily,   R     Question:  Specimen collection method  Answer:  Lab=Lab collect   04/20/23 1334   04/20/23 0845  C-reactive protein  Daily,   R     Question:  Specimen collection method  Answer:  Lab=Lab collect   04/20/23 0844            Total time spent in review of labs and imaging, patient evaluation, formulation of plan, documentation and communication with family: 55 minutes  Signed, Chapman Rota, MD [facility] 04/22/2023

## 2023-04-22 NOTE — Plan of Care (Signed)

## 2023-04-22 NOTE — Telephone Encounter (Signed)
 Called patients daughter, [name].  Reviewed information per Dr. Marchelle Gearing.  Patients daughter verbalized understanding.  NFN at this time.

## 2023-04-22 NOTE — TOC Initial Note (Signed)
 Transition of Care [facility]) - Initial/Assessment Note    Patient Details  Name: Melanie Armstrong MRN: [id number] Date of Birth: [birth year]  [facility] CM/SW Contact:    Roxie KANDICE Stain, RN Phone Number: 04/22/2023, 2:45 PM  Clinical Narrative:                 Patient admitted for COVID pneumonia. Patient has family support. Currently on 50 HF 02. TOC will continue to follow for needs. PCP on file.  Expected Discharge Plan: Home w Home Health Services Barriers to Discharge: Continued Medical Work up   Patient Goals and CMS Choice            Expected Discharge Plan and Services       Living arrangements for the past 2 months: Single Family Home                                      Prior Living Arrangements/Services Living arrangements for the past 2 months: Single Family Home   Patient language and need for interpreter reviewed:: Yes Do you feel safe going back to the place where you live?: Yes      Need for Family Participation in Patient Care: Yes (Comment) Care giver support system in place?: Yes (comment)   Criminal Activity/Legal Involvement Pertinent to Current Situation/Hospitalization: No - Comment as needed  Activities of Daily Living   ADL Screening (condition at time of admission) Independently performs ADLs?: Yes (appropriate for developmental age) Is the patient deaf or have difficulty hearing?: No Does the patient have difficulty seeing, even when wearing glasses/contacts?: No Does the patient have difficulty concentrating, remembering, or making decisions?: No  Permission Sought/Granted                  Emotional Assessment       Orientation: : Oriented to  Time, Oriented to Place, Oriented to Self, Oriented to Situation Alcohol / Substance Use: Not Applicable Psych Involvement: No (comment)  Admission diagnosis:  Hypoxia [R09.02] Community acquired pneumonia, unspecified laterality [J18.9] COVID  [U07.1] Pneumonia due to COVID-19 virus [U07.1, J12.82] Patient Active Problem List   Diagnosis Date Noted   Pneumonia due to COVID-19 virus 04/19/2023   Sepsis (HCC) 04/19/2023   Hypokalemia 04/19/2023   Anxiety 04/19/2023   Chronic dermatitis 10/19/2019   Asthma with acute exacerbation 06/21/2018   Asthma 06/16/2018   Pain of left hip joint 06/08/2017   Allergic rhinitis 05/28/2015   Essential hypertension 05/28/2015   Severe persistent asthma, uncomplicated 12/19/2014   GERD (gastroesophageal reflux disease) 12/19/2014   S/P right THA, AA 08/22/2014   PCP:  Arloa Elsie SAUNDERS, MD Pharmacy:   [phone] [place] [address] - [address] AT [facility] [address] [place] [address] Phone: [phone] Fax: [phone]  [facility] [place] - [address] [place] [address] Phone: [phone] Fax: [phone]  [facility] [address] [place] - [address] [place] [address] Phone: [phone] Fax: [phone]     Social Drivers of Health (SDOH) Social History: SDOH Screenings   Food Insecurity: No Food Insecurity (04/20/2023)  Housing: Patient Declined (04/20/2023)  Transportation Needs: No Transportation Needs (04/20/2023)  Utilities: Patient Declined (04/20/2023)  Social Connections: Unknown (04/20/2023)  Tobacco Use: Low Risk  (04/20/2023)   SDOH Interventions:  Readmission Risk Interventions     No data to display

## 2023-04-23 DIAGNOSIS — J1282 Pneumonia due to coronavirus disease 2019: Secondary | ICD-10-CM | POA: Diagnosis not present

## 2023-04-23 DIAGNOSIS — U071 COVID-19: Secondary | ICD-10-CM | POA: Diagnosis not present

## 2023-04-23 LAB — COMPREHENSIVE METABOLIC PANEL
ALT: 40 U/L (ref 0–44)
AST: 23 U/L (ref 15–41)
Albumin: 2.3 g/dL — ABNORMAL LOW (ref 3.5–5.0)
Alkaline Phosphatase: 62 U/L (ref 38–126)
Anion gap: 10 (ref 5–15)
BUN: 22 mg/dL (ref 8–23)
CO2: 25 mmol/L (ref 22–32)
Calcium: 8.6 mg/dL — ABNORMAL LOW (ref 8.9–10.3)
Chloride: 101 mmol/L (ref 98–111)
Creatinine, Ser: 0.7 mg/dL (ref 0.44–1.00)
GFR, Estimated: >60 mL/min (ref >60)
Glucose, Bld: 192 mg/dL — ABNORMAL HIGH (ref 70–99)
Potassium: 4.2 mmol/L (ref 3.5–5.1)
Sodium: 136 mmol/L (ref 135–145)
Total Bilirubin: 0.6 mg/dL (ref 0.0–1.2)
Total Protein: 5.3 g/dL — ABNORMAL LOW (ref 6.5–8.1)

## 2023-04-23 LAB — CBC
HCT: 41 % (ref 36.0–46.0)
Hemoglobin: 13.7 g/dL (ref 12.0–15.0)
MCH: 31.2 pg (ref 26.0–34.0)
MCHC: 33.4 g/dL (ref 30.0–36.0)
MCV: 93.4 fL (ref 80.0–100.0)
Platelets: 328 10*3/uL (ref 150–400)
RBC: 4.39 MIL/uL (ref 3.87–5.11)
RDW: 12 % (ref 11.5–15.5)
WBC: 16.9 10*3/uL — ABNORMAL HIGH (ref 4.0–10.5)
nRBC: 0 % (ref 0.0–0.2)

## 2023-04-23 LAB — C-REACTIVE PROTEIN: CRP: 2.5 mg/dL — ABNORMAL HIGH (ref <1.0)

## 2023-04-23 MED ORDER — AZITHROMYCIN 500 MG PO TABS
500.0000 mg | ORAL_TABLET | Freq: Once | ORAL | Status: DC
  Filled 2023-04-23: qty 1

## 2023-04-23 NOTE — Progress Notes (Signed)
 PROGRESS NOTE  TAMANIKA HEINEY  DOB: Oct 21, 1955  PCP: Arloa Elsie SAUNDERS, MD FMW:[id number]  DOA: 04/19/2023  LOS: 4 days  Hospital Day: 5  Brief narrative: Melanie Armstrong is a 68 y.o. female with PMH significant for obesity, HTN, GERD, arthritis, asthma 1/5, patient presented to the ED with complaint of shortness of breath, cough, wheezing worsening over a period of week, not responding to clarithromycin  as an outpatient  In the ED, she had a fever of 101.2.  She was severely hypoxic to 70s on room air and required O2 at 10 L by nasal cannula. WBC count normal, lactic acid 1.4, potassium low at 2.9, CRP elevated to 13, procalcitonin negative COVID-19 PCR was positive. CTA chest negative for PE but significant for multifocal infiltrates concerning for multifocal pneumonia.  Patient started empirically on IV remdesivir , IV steroids ceftriaxone  and azithromycin .  Admitted to [facility] Patient's respiratory status continued to worsen and required heated high flow up to 50 L/min at 100% FiO2.  1/7 Actemra  was ordered as well.   Subjective: Patient was seen and examined this morning.   Pleasant elderly Caucasian female.   Lying on bed.  Looks somnolent.  Says he could not get good sleep last night because of the noise from the BiPAP machine.   Currently on 40 L of heated high flow oxygen. Daughter not at bedside.   Assessment and plan: Sepsis POA COVID pneumonia Acute respiratory failure with hypoxia  Presented with worsening shortness of breath, cough for a week COVID PCR positive CTA chest significant for multifocal infiltrates concerning for multifocal pneumonia.  Treatment: Initially started on IV remdesivir , IV steroids.  With worsening hypoxia, Actemra  was given on 1/7 and repeated again on 1/8.  Currently continued on IV Solu-Medrol  80 mg daily. Also getting oral Lasix  40 mg daily.   Currently requiring 40 L heated high flow oxygen which is an improvement from 50 L yesterday.  Wean  down as tolerated Continue supportive care with scheduled Tessalon  Perles, as needed Robitussin Tylenol , incentive spirometry WBC uptrending due to steroids.  Procalcitonin not elevated, CRP seems to be improving Blood culture without any growth. Currently also on empiric antibiotic with ceftriaxone  and azithromycin .  Given the clinical severity, okay to complete 5-day empiric course.  QTc 446 ms on 1/5 Recent Labs  Lab 04/19/23 0910 04/19/23 0912 04/19/23 0923 04/19/23 1125 04/20/23 0208 04/20/23 0859 04/21/23 0228 04/22/23 0235 04/23/23 0230  SARSCOV2NAA POSITIVE*  --   --   --   --   --   --   --   --   WBC  --  7.5  --   --  2.6*  --  11.1* 13.0* 16.9*  LATICACIDVEN  --   --  1.4  --   --   --   --   --   --   PROCALCITON  --   --   --  <0.10  --   --   --   --   --   DDIMER  --   --   --  1.19*  --   --   --   --   --   CRP  --   --   --   --   --  13.2* 7.4* 4.2* 2.5*  ALT  --  36  --   --   --   --  46* 44 40   Severe persistent asthma exacerbation Secondary to COVID-19 with pneumonia. Steroids as above Continue  bronchodilators.:  Singulair , Claritin , Pulmicort , Brovana , Incruse Ellipta , albuterol    Severe hypokalemia Improved with replacement.  Potassium normal this morning. Recent Labs  Lab 04/19/23 1117 04/19/23 1125 04/20/23 0208 04/21/23 0228 04/22/23 0235 04/23/23 0230  K 2.8*  --  3.8 3.4* 4.4 4.2  MG  --  2.2  --   --   --   --   PHOS  --  2.5  --   --   --   --    Primary hypertension PTA meds- lisinopril  40 mg daily Currently on Lasix  daily.  Blood pressure in normal range   Anxiety Continue diazepam  PRN   GERD Continue Pepcid    Mobility: Encourage ambulation when able to  Goals of care   Code Status: Full Code     DVT prophylaxis:  enoxaparin  (LOVENOX ) injection 40 mg Start: 04/19/23 2000   Antimicrobials: IV Rocephin , azithromycin  Fluid: None Consultants: None  Family Communication: Patient's daughter Ms. Christine not at bedside  today  Status: Inpatient Level of care:  Progressive   Patient is from: Home Needs to continue in-hospital care: Severely hypoxic Anticipated d/c to: Pending clinical course      Diet:  Diet Order             Diet Heart Room service appropriate? Yes; Fluid consistency: Thin  Diet effective now                   Scheduled Meds:  albuterol   2.5 mg Nebulization Q6H   arformoterol   15 mcg Nebulization BID   ascorbic acid   500 mg Oral Daily   azithromycin   500 mg Oral Daily   benzonatate   100 mg Oral TID   betaxolol   1 drop Both Eyes BID   budesonide  (PULMICORT ) nebulizer solution  0.5 mg Nebulization BID   dorzolamide   1 drop Both Eyes BID   enoxaparin  (LOVENOX ) injection  40 mg Subcutaneous Q24H   famotidine   20 mg Oral BID   furosemide   40 mg Oral q morning   guaiFENesin   600 mg Oral BID   latanoprost   1 drop Both Eyes QHS   loratadine   10 mg Oral Daily   methylPREDNISolone  (SOLU-MEDROL ) injection  80 mg Intravenous Daily   montelukast   10 mg Oral QHS   multivitamin with minerals  1 tablet Oral Daily   sodium chloride  flush  3 mL Intravenous Q12H   umeclidinium bromide   1 puff Inhalation Daily    PRN meds: acetaminophen  **OR** acetaminophen , chlorpheniramine-HYDROcodone , diazepam , guaiFENesin -dextromethorphan    Infusions:   cefTRIAXone  (ROCEPHIN )  IV Stopped (04/22/23 1010)    Antimicrobials: Anti-infectives (From admission, onward)    Start     Dose/Rate Route Frequency Ordered Stop   04/22/23 1000  remdesivir  100 mg in sodium chloride  0.9 % 100 mL IVPB  Status:  Discontinued       Note to Pharmacy: Lets resume to complete a 5-day course   100 mg 200 mL/hr over 30 Minutes Intravenous Daily 04/22/23 0842 04/22/23 1111   04/20/23 1000  cefTRIAXone  (ROCEPHIN ) 1 g in sodium chloride  0.9 % 100 mL IVPB  Status:  Discontinued        1 g 200 mL/hr over 30 Minutes Intravenous Every 24 hours 04/19/23 1042 04/19/23 1114   04/20/23 1000  azithromycin  (ZITHROMAX )  tablet 500 mg        500 mg Oral Daily 04/19/23 1113 04/24/23 0959   04/20/23 1000  cefTRIAXone  (ROCEPHIN ) 2 g in sodium chloride  0.9 % 100 mL IVPB  2 g 200 mL/hr over 30 Minutes Intravenous Every 24 hours 04/19/23 1114     04/20/23 1000  remdesivir  100 mg in sodium chloride  0.9 % 100 mL IVPB       Placed in Followed by Linked Group   100 mg 200 mL/hr over 30 Minutes Intravenous Daily 04/19/23 1120 04/21/23 1131   04/19/23 1130  remdesivir  200 mg in sodium chloride  0.9% 250 mL IVPB       Placed in Followed by Linked Group   200 mg 580 mL/hr over 30 Minutes Intravenous Once 04/19/23 1120 04/19/23 1351   04/19/23 1115  cefTRIAXone  (ROCEPHIN ) 1 g in sodium chloride  0.9 % 100 mL IVPB        1 g 200 mL/hr over 30 Minutes Intravenous  Once 04/19/23 1114 04/19/23 1212   04/19/23 1015  cefTRIAXone  (ROCEPHIN ) 1 g in sodium chloride  0.9 % 100 mL IVPB        1 g 200 mL/hr over 30 Minutes Intravenous  Once 04/19/23 1000 04/19/23 1013   04/19/23 1015  azithromycin  (ZITHROMAX ) tablet 500 mg        500 mg Oral  Once 04/19/23 1000 04/19/23 1012       Objective: Vitals:   04/23/23 0731 04/23/23 0900  BP:  (!) 127/55  Pulse:  89  Resp:  16  Temp:  98.3 F (36.8 C)  SpO2: 93% 97%    Intake/Output Summary (Last 24 hours) at 04/23/2023 1027 Last data filed at 04/23/2023 0840 Gross per 24 hour  Intake 780 ml  Output 1500 ml  Net -720 ml   Filed Weights   04/20/23 0500 04/21/23 0757 04/23/23 0347  Weight: 92 kg 93.5 kg 93.9 kg   Weight change: 0.414 kg Body mass index is 36.67 kg/m.   Physical Exam: General exam: Pleasant, elderly female. Skin: No rashes, lesions or ulcers. HEENT: Atraumatic, normocephalic, no obvious bleeding Lungs: On heated high flow oxygen.  Clear to auscultation bilaterally CVS: S1, S2, no murmur,   GI/Abd: Soft, nontender, nondistended, bowel sound present,   CNS: Alert, awake, oriented x 3 Psychiatry: Somnolent from loss of sleep last  night. Extremities: No pedal edema, no calf tenderness,   Data Review: I have personally reviewed the laboratory data and studies available.  F/u labs  Unresulted Labs (From admission, onward)     Start     Ordered   04/21/23 0500  Comprehensive metabolic panel  Daily,   R     Question:  Specimen collection method  Answer:  Lab=Lab collect   04/20/23 1334   04/21/23 0500  CBC  Daily,   R     Question:  Specimen collection method  Answer:  Lab=Lab collect   04/20/23 1334   04/20/23 0845  C-reactive protein  Daily,   R     Question:  Specimen collection method  Answer:  Lab=Lab collect   04/20/23 0844            Total time spent in review of labs and imaging, patient evaluation, formulation of plan, documentation and communication with family: 45 minutes  Signed, Chapman Rota, MD [facility] 04/23/2023

## 2023-04-23 NOTE — Plan of Care (Signed)

## 2023-04-24 DIAGNOSIS — U071 COVID-19: Secondary | ICD-10-CM | POA: Diagnosis not present

## 2023-04-24 DIAGNOSIS — J1282 Pneumonia due to coronavirus disease 2019: Secondary | ICD-10-CM | POA: Diagnosis not present

## 2023-04-24 LAB — C-REACTIVE PROTEIN: CRP: 1.4 mg/dL — ABNORMAL HIGH (ref <1.0)

## 2023-04-24 LAB — COMPREHENSIVE METABOLIC PANEL
ALT: 36 U/L (ref 0–44)
AST: 23 U/L (ref 15–41)
Albumin: 2.2 g/dL — ABNORMAL LOW (ref 3.5–5.0)
Alkaline Phosphatase: 63 U/L (ref 38–126)
Anion gap: 11 (ref 5–15)
BUN: 20 mg/dL (ref 8–23)
CO2: 29 mmol/L (ref 22–32)
Calcium: 8.7 mg/dL — ABNORMAL LOW (ref 8.9–10.3)
Chloride: 99 mmol/L (ref 98–111)
Creatinine, Ser: 0.68 mg/dL (ref 0.44–1.00)
GFR, Estimated: >60 mL/min (ref >60)
Glucose, Bld: 137 mg/dL — ABNORMAL HIGH (ref 70–99)
Potassium: 4.2 mmol/L (ref 3.5–5.1)
Sodium: 139 mmol/L (ref 135–145)
Total Bilirubin: 0.6 mg/dL (ref 0.0–1.2)
Total Protein: 5.1 g/dL — ABNORMAL LOW (ref 6.5–8.1)

## 2023-04-24 LAB — CBC
HCT: 42.1 % (ref 36.0–46.0)
Hemoglobin: 13.9 g/dL (ref 12.0–15.0)
MCH: 31.3 pg (ref 26.0–34.0)
MCHC: 33 g/dL (ref 30.0–36.0)
MCV: 94.8 fL (ref 80.0–100.0)
Platelets: 330 10*3/uL (ref 150–400)
RBC: 4.44 MIL/uL (ref 3.87–5.11)
RDW: 12.2 % (ref 11.5–15.5)
WBC: 15.6 10*3/uL — ABNORMAL HIGH (ref 4.0–10.5)
nRBC: 0 % (ref 0.0–0.2)

## 2023-04-24 LAB — CULTURE, BLOOD (ROUTINE X 2)
Culture: NO GROWTH
Culture: NO GROWTH
Special Requests: ADEQUATE

## 2023-04-24 NOTE — Progress Notes (Signed)
 PROGRESS NOTE  Melanie Armstrong  DOB: [birth year]  PCP: Arloa Elsie SAUNDERS, MD FMW:[id number]  DOA: 04/19/2023  LOS: 5 days  Hospital Day: 6  Brief narrative: Melanie Armstrong is a 68 y.o. female with PMH significant for obesity, HTN, GERD, arthritis, asthma 1/5, patient presented to the ED with complaint of shortness of breath, cough, wheezing worsening over a period of week, not responding to clarithromycin  as an outpatient  In the ED, she had a fever of 101.2.  She was severely hypoxic to 70s on room air and required O2 at 10 L by nasal cannula. WBC count normal, lactic acid 1.4, potassium low at 2.9, CRP elevated to 13, procalcitonin negative COVID-19 PCR was positive. CTA chest negative for PE but significant for multifocal infiltrates concerning for multifocal pneumonia.  Patient started empirically on IV remdesivir , IV steroids ceftriaxone  and azithromycin .  Admitted to [facility] Patient's respiratory status continued to worsen and required heated high flow up to 50 L/min at 100% FiO2.  1/7 Actemra  was ordered as well.   Subjective: Patient was seen and examined this morning.   Pleasant elderly Caucasian female.   Lying on bed.  Not in distress. Says she slept well last night. On 35 L of heated high flow oxygen which is better than 40 L yesterday.  I have advised respiratory therapist to target low O2 sat of mid 80s.  Assessment and plan: Sepsis POA COVID pneumonia Acute respiratory failure with hypoxia  Presented with worsening shortness of breath, cough for a week COVID PCR positive CTA chest significant for multifocal infiltrates concerning for multifocal pneumonia.  Treatment: Initially started on IV remdesivir , IV steroids.  With worsening hypoxia, Actemra  was given on 1/7 and repeated again on 1/8.  Currently continued on IV Solu-Medrol  80 mg daily. Also getting oral Lasix  40 mg daily.   Currently requiring 35 L heated high flow oxygen which is an improvement from 40 L  yesterday.  Wean down as tolerated. I have advised respiratory therapist to target low O2 sat of mid 80s. Continue supportive care with scheduled Tessalon  Perles, as needed Robitussin Tylenol , incentive spirometry WBC uptrending due to steroids. Procalcitonin not elevated, CRP seems to be improving. Blood culture without any growth. Because of the clinical severity, patient was also given 5-day course of empiric antibiotic treatment with ceftriaxone  and azithromycin .  Recent Labs  Lab 04/19/23 0910 04/19/23 0912 04/19/23 0912 04/19/23 [id number] 04/19/23 1125 04/20/23 0208 04/20/23 0859 04/21/23 0228 04/22/23 0235 04/23/23 0230 04/24/23 0217  SARSCOV2NAA POSITIVE*  --   --   --   --   --   --   --   --   --   --   WBC  --  7.5   < >  --   --  2.6*  --  11.1* 13.0* 16.9* 15.6*  LATICACIDVEN  --   --   --  1.4  --   --   --   --   --   --   --   PROCALCITON  --   --   --   --  <0.10  --   --   --   --   --   --   DDIMER  --   --   --   --  1.19*  --   --   --   --   --   --   CRP  --   --   --   --   --   --  13.2* 7.4* 4.2* 2.5* 1.4*  ALT  --  36  --   --   --   --   --  46* 44 40 36   < > = values in this interval not displayed.   Severe persistent asthma exacerbation Secondary to COVID-19 with pneumonia. Steroids as above Continue bronchodilators.:  Singulair , Claritin , Pulmicort , Brovana , Incruse Ellipta , albuterol    Severe hypokalemia Improved with replacement.  Potassium normal this morning. Recent Labs  Lab 04/19/23 1125 04/20/23 0208 04/21/23 0228 04/22/23 0235 04/23/23 0230 04/24/23 0217  K  --  3.8 3.4* 4.4 4.2 4.2  MG 2.2  --   --   --   --   --   PHOS 2.5  --   --   --   --   --    Primary hypertension PTA meds- lisinopril  40 mg daily Currently on Lasix  daily.  Blood pressure in normal range   Anxiety Continue diazepam  PRN   GERD Continue Pepcid    Mobility: Encourage ambulation when able to  Goals of care   Code Status: Full Code     DVT  prophylaxis:  enoxaparin  (LOVENOX ) injection 40 mg Start: 04/19/23 2000   Antimicrobials: Completed the course of Rocephin , azithromycin  Fluid: None Consultants: None  Family Communication: Family not at bedside  Status: Inpatient Level of care:  Progressive   Patient is from: Home Needs to continue in-hospital care: Severely hypoxic Anticipated d/c to: Pending clinical course      Diet:  Diet Order             Diet Heart Room service appropriate? Yes; Fluid consistency: Thin  Diet effective now                   Scheduled Meds:  albuterol   2.5 mg Nebulization Q6H   arformoterol   15 mcg Nebulization BID   ascorbic acid   500 mg Oral Daily   benzonatate   100 mg Oral TID   betaxolol   1 drop Both Eyes BID   budesonide  (PULMICORT ) nebulizer solution  0.5 mg Nebulization BID   dorzolamide   1 drop Both Eyes BID   enoxaparin  (LOVENOX ) injection  40 mg Subcutaneous Q24H   famotidine   20 mg Oral BID   furosemide   40 mg Oral q morning   guaiFENesin   600 mg Oral BID   latanoprost   1 drop Both Eyes QHS   loratadine   10 mg Oral Daily   methylPREDNISolone  (SOLU-MEDROL ) injection  80 mg Intravenous Daily   montelukast   10 mg Oral QHS   multivitamin with minerals  1 tablet Oral Daily   sodium chloride  flush  3 mL Intravenous Q12H   umeclidinium bromide   1 puff Inhalation Daily    PRN meds: acetaminophen  **OR** acetaminophen , chlorpheniramine-HYDROcodone , diazepam , guaiFENesin -dextromethorphan    Infusions:     Antimicrobials: Anti-infectives (From admission, onward)    Start     Dose/Rate Route Frequency Ordered Stop   04/23/23 1130  azithromycin  (ZITHROMAX ) tablet 500 mg  Status:  Discontinued        500 mg Oral  Once 04/23/23 1034 04/23/23 1100   04/22/23 1000  remdesivir  100 mg in sodium chloride  0.9 % 100 mL IVPB  Status:  Discontinued       Note to Pharmacy: Lets resume to complete a 5-day course   100 mg 200 mL/hr over 30 Minutes Intravenous Daily 04/22/23  0842 04/22/23 1111   04/20/23 1000  cefTRIAXone  (ROCEPHIN ) 1 g in sodium chloride  0.9 % 100 mL IVPB  Status:  Discontinued        1 g 200 mL/hr over 30 Minutes Intravenous Every 24 hours 04/19/23 1042 04/19/23 1114   04/20/23 1000  azithromycin  (ZITHROMAX ) tablet 500 mg        500 mg Oral Daily 04/19/23 1113 04/23/23 1210   04/20/23 1000  cefTRIAXone  (ROCEPHIN ) 2 g in sodium chloride  0.9 % 100 mL IVPB        2 g 200 mL/hr over 30 Minutes Intravenous Every 24 hours 04/19/23 1114 04/23/23 1612   04/20/23 1000  remdesivir  100 mg in sodium chloride  0.9 % 100 mL IVPB       Placed in Followed by Linked Group   100 mg 200 mL/hr over 30 Minutes Intravenous Daily 04/19/23 1120 04/21/23 1131   04/19/23 1130  remdesivir  200 mg in sodium chloride  0.9% 250 mL IVPB       Placed in Followed by Linked Group   200 mg 580 mL/hr over 30 Minutes Intravenous Once 04/19/23 1120 04/19/23 1351   04/19/23 1115  cefTRIAXone  (ROCEPHIN ) 1 g in sodium chloride  0.9 % 100 mL IVPB        1 g 200 mL/hr over 30 Minutes Intravenous  Once 04/19/23 1114 04/19/23 1212   04/19/23 1015  cefTRIAXone  (ROCEPHIN ) 1 g in sodium chloride  0.9 % 100 mL IVPB        1 g 200 mL/hr over 30 Minutes Intravenous  Once 04/19/23 1000 04/19/23 1013   04/19/23 1015  azithromycin  (ZITHROMAX ) tablet 500 mg        500 mg Oral  Once 04/19/23 1000 04/19/23 1012       Objective: Vitals:   04/24/23 0841 04/24/23 1200  BP:  129/69  Pulse:  84  Resp:  12  Temp:  98.3 F (36.8 C)  SpO2: 93% 90%    Intake/Output Summary (Last 24 hours) at 04/24/2023 1412 Last data filed at 04/24/2023 0700 Gross per 24 hour  Intake --  Output 1550 ml  Net -1550 ml   Filed Weights   04/21/23 0757 04/23/23 0347 04/24/23 0532  Weight: 93.5 kg 93.9 kg 92.7 kg   Weight change: -1.2 kg Body mass index is 36.2 kg/m.   Physical Exam: General exam: Pleasant, elderly female. Skin: No rashes, lesions or ulcers. HEENT: Atraumatic, normocephalic, no  obvious bleeding Lungs: On heated high flow oxygen.  Clear to auscultation bilaterally CVS: S1, S2, no murmur,   GI/Abd: Soft, nontender, nondistended, bowel sound present,   CNS: Alert, awake, oriented x 3 Psychiatry: Alert, awake, oriented x 3  Extremities: No pedal edema, no calf tenderness,   Data Review: I have personally reviewed the laboratory data and studies available.  F/u labs  Unresulted Labs (From admission, onward)     Start     Ordered   04/21/23 0500  Comprehensive metabolic panel  Daily,   R     Question:  Specimen collection method  Answer:  Lab=Lab collect   04/20/23 1334   04/21/23 0500  CBC  Daily,   R     Question:  Specimen collection method  Answer:  Lab=Lab collect   04/20/23 1334            Total time spent in review of labs and imaging, patient evaluation, formulation of plan, documentation and communication with family: 45 minutes  Signed, Chapman Rota, MD [facility] 04/24/2023

## 2023-04-24 NOTE — Plan of Care (Signed)

## 2023-04-25 DIAGNOSIS — U071 COVID-19: Secondary | ICD-10-CM | POA: Diagnosis not present

## 2023-04-25 DIAGNOSIS — J1282 Pneumonia due to coronavirus disease 2019: Secondary | ICD-10-CM | POA: Diagnosis not present

## 2023-04-25 LAB — COMPREHENSIVE METABOLIC PANEL
ALT: 34 U/L (ref 0–44)
AST: 22 U/L (ref 15–41)
Albumin: 2.4 g/dL — ABNORMAL LOW (ref 3.5–5.0)
Alkaline Phosphatase: 61 U/L (ref 38–126)
Anion gap: 6 (ref 5–15)
BUN: 22 mg/dL (ref 8–23)
CO2: 31 mmol/L (ref 22–32)
Calcium: 8.7 mg/dL — ABNORMAL LOW (ref 8.9–10.3)
Chloride: 99 mmol/L (ref 98–111)
Creatinine, Ser: 0.7 mg/dL (ref 0.44–1.00)
GFR, Estimated: >60 mL/min (ref >60)
Glucose, Bld: 121 mg/dL — ABNORMAL HIGH (ref 70–99)
Potassium: 4.2 mmol/L (ref 3.5–5.1)
Sodium: 136 mmol/L (ref 135–145)
Total Bilirubin: 0.7 mg/dL (ref 0.0–1.2)
Total Protein: 5.2 g/dL — ABNORMAL LOW (ref 6.5–8.1)

## 2023-04-25 LAB — CBC
HCT: 42 % (ref 36.0–46.0)
Hemoglobin: 13.8 g/dL (ref 12.0–15.0)
MCH: 31.2 pg (ref 26.0–34.0)
MCHC: 32.9 g/dL (ref 30.0–36.0)
MCV: 94.8 fL (ref 80.0–100.0)
Platelets: 299 10*3/uL (ref 150–400)
RBC: 4.43 MIL/uL (ref 3.87–5.11)
RDW: 12.3 % (ref 11.5–15.5)
WBC: 14.6 10*3/uL — ABNORMAL HIGH (ref 4.0–10.5)
nRBC: 0 % (ref 0.0–0.2)

## 2023-04-25 NOTE — Progress Notes (Signed)
 PROGRESS NOTE  Melanie Armstrong  DOB: 01-23-1956  PCP: Arloa Elsie SAUNDERS, MD FMW:[id number]  DOA: 04/19/2023  LOS: 6 days  Hospital Day: 7  Brief narrative: Melanie Armstrong is a 68 y.o. female with PMH significant for obesity, HTN, GERD, arthritis, asthma 1/5, patient presented to the ED with complaint of shortness of breath, cough, wheezing worsening over a period of week, not responding to clarithromycin  as an outpatient  In the ED, she had a fever of 101.2.  She was severely hypoxic to 70s on room air and required O2 at 10 L by nasal cannula. WBC count normal, lactic acid 1.4, potassium low at 2.9, CRP elevated to 13, procalcitonin negative COVID-19 PCR was positive. CTA chest negative for PE but significant for multifocal infiltrates concerning for multifocal pneumonia.  Patient started empirically on IV remdesivir , IV steroids ceftriaxone  and azithromycin .  Admitted to [facility] Patient's respiratory status continued to worsen and required heated high flow up to 50 L/min at 100% FiO2.  1/7 Actemra  was ordered as well.   Subjective: Patient was seen and examined this morning.  Lying in bed.  Not in distress.  On 35 L heated high flow oxygen.  FiO2 being titrated down by respiratory therapist. Daughter at bedside Patient slept well last night  Assessment and plan: Sepsis POA COVID pneumonia Acute respiratory failure with hypoxia  Presented with worsening shortness of breath, cough for a week COVID PCR positive CTA chest significant for multifocal infiltrates concerning for multifocal pneumonia.  Treatment: Initially started on IV remdesivir , IV steroids.  With worsening hypoxia, Actemra  was given on 1/7 and repeated again on 1/8.  Currently continued on IV Solu-Medrol  80 mg daily. Also getting oral Lasix  40 mg daily.   Currently requiring 35 L heated high flow oxygen.  Wean down as tolerated.  FiO2 being titrated down.  I have advised respiratory therapist to target low O2 sat of mid  80s. Continue supportive care with scheduled Tessalon  Perles, as needed Robitussin Tylenol , incentive spirometry WBC count elevated due to steroids.   Procalcitonin not elevated, CRP improving. Blood culture without any growth. Because of the clinical severity, patient was also given 5-day course of empiric antibiotic treatment with ceftriaxone  and azithromycin .  Recent Labs  Lab 04/19/23 0910 04/19/23 0912 04/19/23 0923 04/19/23 1125 04/20/23 0208 04/20/23 0859 04/21/23 0228 04/22/23 0235 04/23/23 0230 04/24/23 0217 04/25/23 0210  SARSCOV2NAA POSITIVE*  --   --   --   --   --   --   --   --   --   --   WBC  --    < >  --   --    < >  --  11.1* 13.0* 16.9* 15.6* 14.6*  LATICACIDVEN  --   --  1.4  --   --   --   --   --   --   --   --   PROCALCITON  --   --   --  <0.10  --   --   --   --   --   --   --   DDIMER  --   --   --  1.19*  --   --   --   --   --   --   --   CRP  --   --   --   --   --  13.2* 7.4* 4.2* 2.5* 1.4*  --   ALT  --    < >  --   --   --   --  46* 44 40 36 34   < > = values in this interval not displayed.   Severe persistent asthma exacerbation Secondary to COVID-19 with pneumonia. Steroids as above Continue bronchodilators.:  Singulair , Claritin , Pulmicort , Brovana , Incruse Ellipta , albuterol    Severe hypokalemia Improved with replacement.  Potassium normal this morning. Recent Labs  Lab 04/19/23 1125 04/20/23 0208 04/21/23 0228 04/22/23 0235 04/23/23 0230 04/24/23 0217 04/25/23 0210  K  --    < > 3.4* 4.4 4.2 4.2 4.2  MG 2.2  --   --   --   --   --   --   PHOS 2.5  --   --   --   --   --   --    < > = values in this interval not displayed.   Primary hypertension PTA meds- lisinopril  40 mg daily Currently on Lasix  daily.  Blood pressure in normal range   Anxiety Continue diazepam  PRN   GERD Continue Pepcid    Mobility: Encourage ambulation when able to  Goals of care   Code Status: Full Code     DVT prophylaxis:  enoxaparin   (LOVENOX ) injection 40 mg Start: 04/19/23 2000   Antimicrobials: Completed the course of Rocephin , azithromycin  Fluid: None Consultants: None  Family Communication: Daughter at bedside  Status: Inpatient Level of care:  Progressive   Patient is from: Home Needs to continue in-hospital care: Severely hypoxic Anticipated d/c to: Pending clinical course      Diet:  Diet Order             Diet Heart Room service appropriate? Yes; Fluid consistency: Thin  Diet effective now                   Scheduled Meds:  albuterol   2.5 mg Nebulization Q6H   arformoterol   15 mcg Nebulization BID   ascorbic acid   500 mg Oral Daily   benzonatate   100 mg Oral TID   betaxolol   1 drop Both Eyes BID   budesonide  (PULMICORT ) nebulizer solution  0.5 mg Nebulization BID   dorzolamide   1 drop Both Eyes BID   enoxaparin  (LOVENOX ) injection  40 mg Subcutaneous Q24H   famotidine   20 mg Oral BID   furosemide   40 mg Oral q morning   guaiFENesin   600 mg Oral BID   latanoprost   1 drop Both Eyes QHS   loratadine   10 mg Oral Daily   methylPREDNISolone  (SOLU-MEDROL ) injection  80 mg Intravenous Daily   montelukast   10 mg Oral QHS   multivitamin with minerals  1 tablet Oral Daily   sodium chloride  flush  3 mL Intravenous Q12H   umeclidinium bromide   1 puff Inhalation Daily    PRN meds: acetaminophen  **OR** acetaminophen , chlorpheniramine-HYDROcodone , diazepam , guaiFENesin -dextromethorphan    Infusions:     Antimicrobials: Anti-infectives (From admission, onward)    Start     Dose/Rate Route Frequency Ordered Stop   04/23/23 1130  azithromycin  (ZITHROMAX ) tablet 500 mg  Status:  Discontinued        500 mg Oral  Once 04/23/23 1034 04/23/23 1100   04/22/23 1000  remdesivir  100 mg in sodium chloride  0.9 % 100 mL IVPB  Status:  Discontinued       Note to Pharmacy: Lets resume to complete a 5-day course   100 mg 200 mL/hr over 30 Minutes Intravenous Daily 04/22/23 0842 04/22/23 1111   04/20/23  1000  cefTRIAXone  (ROCEPHIN ) 1 g in sodium chloride  0.9 % 100 mL IVPB  Status:  Discontinued        1 g 200 mL/hr over 30 Minutes Intravenous Every 24 hours 04/19/23 1042 04/19/23 1114   04/20/23 1000  azithromycin  (ZITHROMAX ) tablet 500 mg        500 mg Oral Daily 04/19/23 1113 04/23/23 1210   04/20/23 1000  cefTRIAXone  (ROCEPHIN ) 2 g in sodium chloride  0.9 % 100 mL IVPB        2 g 200 mL/hr over 30 Minutes Intravenous Every 24 hours 04/19/23 1114 04/23/23 1612   04/20/23 1000  remdesivir  100 mg in sodium chloride  0.9 % 100 mL IVPB       Placed in Followed by Linked Group   100 mg 200 mL/hr over 30 Minutes Intravenous Daily 04/19/23 1120 04/21/23 1131   04/19/23 1130  remdesivir  200 mg in sodium chloride  0.9% 250 mL IVPB       Placed in Followed by Linked Group   200 mg 580 mL/hr over 30 Minutes Intravenous Once 04/19/23 1120 04/19/23 1351   04/19/23 1115  cefTRIAXone  (ROCEPHIN ) 1 g in sodium chloride  0.9 % 100 mL IVPB        1 g 200 mL/hr over 30 Minutes Intravenous  Once 04/19/23 1114 04/19/23 1212   04/19/23 1015  cefTRIAXone  (ROCEPHIN ) 1 g in sodium chloride  0.9 % 100 mL IVPB        1 g 200 mL/hr over 30 Minutes Intravenous  Once 04/19/23 1000 04/19/23 1013   04/19/23 1015  azithromycin  (ZITHROMAX ) tablet 500 mg        500 mg Oral  Once 04/19/23 1000 04/19/23 1012       Objective: Vitals:   04/25/23 0629 04/25/23 0847  BP: (!) 154/73 (!) 123/52  Pulse: 74 98  Resp: 12 17  Temp: 97.6 F (36.4 C) 97.6 F (36.4 C)  SpO2: 93% 93%    Intake/Output Summary (Last 24 hours) at 04/25/2023 1424 Last data filed at 04/25/2023 0819 Gross per 24 hour  Intake 558 ml  Output 650 ml  Net -92 ml   Filed Weights   04/23/23 0347 04/24/23 0532 04/25/23 0629  Weight: 93.9 kg 92.7 kg 92.1 kg   Weight change: -0.6 kg Body mass index is 35.97 kg/m.   Physical Exam: General exam: Pleasant, elderly female. Skin: No rashes, lesions or ulcers. HEENT: Atraumatic, normocephalic, no  obvious bleeding Lungs: On heated high flow oxygen.  Clear to auscultation bilaterally CVS: S1, S2, no murmur,   GI/Abd: Soft, nontender, nondistended, bowel sound present,   CNS: Alert, awake, oriented x 3 Psychiatry: Alert, awake, oriented x 3  Extremities: No pedal edema, no calf tenderness,   Data Review: I have personally reviewed the laboratory data and studies available.  F/u labs  Unresulted Labs (From admission, onward)    None       Total time spent in review of labs and imaging, patient evaluation, formulation of plan, documentation and communication with family: 45 minutes  Signed, Chapman Rota, MD [facility] 04/25/2023

## 2023-04-26 DIAGNOSIS — U071 COVID-19: Secondary | ICD-10-CM | POA: Diagnosis not present

## 2023-04-26 DIAGNOSIS — J1282 Pneumonia due to coronavirus disease 2019: Secondary | ICD-10-CM | POA: Diagnosis not present

## 2023-04-26 NOTE — Progress Notes (Signed)
 RN noted on assessment L hand and L arm swelling non pitting. Will let MD know

## 2023-04-26 NOTE — Progress Notes (Signed)
 The humidity bag was changed on the [facility]

## 2023-04-26 NOTE — Progress Notes (Signed)
 RT Note: Patient HHFNC FIO2 was weaned to 46%, 35L and she is maintaining her oxygen saturation at 92%. She will be weaned as tolerated.   Tolerating well at this time

## 2023-04-26 NOTE — Progress Notes (Signed)
 PROGRESS NOTE  Melanie Armstrong  DOB: [birth year]  PCP: Arloa Elsie SAUNDERS, MD FMW:[id number]  DOA: 04/19/2023  LOS: 7 days  Hospital Day: 8  Brief narrative: Melanie Armstrong is a 68 y.o. female with PMH significant for obesity, HTN, GERD, arthritis, asthma 1/5, patient presented to the ED with complaint of shortness of breath, cough, wheezing worsening over a period of week, not responding to clarithromycin  as an outpatient  In the ED, she had a fever of 101.2.  She was severely hypoxic to 70s on room air and required O2 at 10 L by nasal cannula. WBC count normal, lactic acid 1.4, potassium low at 2.9, CRP elevated to 13, procalcitonin negative COVID-19 PCR was positive. CTA chest negative for PE but significant for multifocal infiltrates concerning for multifocal pneumonia.  Patient started empirically on IV remdesivir , IV steroids ceftriaxone  and azithromycin .  Admitted to [facility] Patient's respiratory status continued to worsen and required heated high flow up to 50 L/min at 100% FiO2.  1/7 Actemra  was ordered as well.   Subjective: Patient was seen and examined this morning.  Lying in bed.  Not in distress.  On 35 L heated high flow oxygen.   Daughter at bedside Patient slept well last night Complains of left hand and arm swelling today.  Minimal on my exam.  Assessment and plan: Sepsis POA COVID pneumonia Acute respiratory failure with hypoxia  Presented with worsening shortness of breath, cough for a week COVID PCR positive CTA chest significant for multifocal infiltrates concerning for multifocal pneumonia.  Treatment: Initially started on IV remdesivir , IV steroids.  With worsening hypoxia, Actemra  was given on 1/7 and repeated again on 1/8.  Currently continued on IV Solu-Medrol  80 mg daily. Also getting oral Lasix  40 mg daily.    Currently requiring 35 L heated high flow oxygen.  Wean down as tolerated.  FiO2 being titrated down.  Target low O2 sat of mid 80s if able to  tolerate without symptoms Continue supportive care with scheduled Tessalon  Perles, as needed Robitussin Tylenol , incentive spirometry WBC count elevated due to steroids.   Procalcitonin not elevated, CRP improving. Blood culture without any growth. Because of the clinical severity, patient was also given 5-day course of empiric antibiotic treatment with ceftriaxone  and azithromycin .  Recent Labs  Lab 04/19/23 1125 04/20/23 0208 04/20/23 0859 04/21/23 0228 04/22/23 0235 04/23/23 0230 04/24/23 0217 04/25/23 0210  WBC  --    < >  --  11.1* 13.0* 16.9* 15.6* 14.6*  PROCALCITON <0.10  --   --   --   --   --   --   --   DDIMER 1.19*  --   --   --   --   --   --   --   CRP  --   --  13.2* 7.4* 4.2* 2.5* 1.4*  --   ALT  --   --   --  46* 44 40 36 34   < > = values in this interval not displayed.   Severe persistent asthma exacerbation Secondary to COVID-19 with pneumonia. Steroids as above Continue bronchodilators.:  Singulair , Claritin , Pulmicort , Brovana , Incruse Ellipta , albuterol    Primary hypertension PTA meds- lisinopril  40 mg daily Currently on Lasix  daily.  Blood pressure in normal range   Anxiety Continue diazepam  PRN   GERD Continue Pepcid    Mobility: Continue mobility inside the room  Goals of care   Code Status: Full Code     DVT prophylaxis:  enoxaparin  (LOVENOX )  injection 40 mg Start: 04/19/23 2000   Antimicrobials: Completed the course of Rocephin , azithromycin  Fluid: None Consultants: None  Family Communication: Daughter at bedside  Status: Inpatient Level of care:  Progressive   Patient is from: Home Needs to continue in-hospital care: Severely hypoxic Anticipated d/c to: Pending clinical course      Diet:  Diet Order             Diet Heart Room service appropriate? Yes; Fluid consistency: Thin  Diet effective now                   Scheduled Meds:  albuterol   2.5 mg Nebulization Q6H   arformoterol   15 mcg Nebulization BID    ascorbic acid   500 mg Oral Daily   benzonatate   100 mg Oral TID   betaxolol   1 drop Both Eyes BID   budesonide  (PULMICORT ) nebulizer solution  0.5 mg Nebulization BID   dorzolamide   1 drop Both Eyes BID   enoxaparin  (LOVENOX ) injection  40 mg Subcutaneous Q24H   famotidine   20 mg Oral BID   furosemide   40 mg Oral q morning   guaiFENesin   600 mg Oral BID   latanoprost   1 drop Both Eyes QHS   loratadine   10 mg Oral Daily   methylPREDNISolone  (SOLU-MEDROL ) injection  80 mg Intravenous Daily   montelukast   10 mg Oral QHS   multivitamin with minerals  1 tablet Oral Daily   sodium chloride  flush  3 mL Intravenous Q12H   umeclidinium bromide   1 puff Inhalation Daily    PRN meds: acetaminophen  **OR** acetaminophen , chlorpheniramine-HYDROcodone , diazepam , guaiFENesin -dextromethorphan    Infusions:     Antimicrobials: Anti-infectives (From admission, onward)    Start     Dose/Rate Route Frequency Ordered Stop   04/23/23 1130  azithromycin  (ZITHROMAX ) tablet 500 mg  Status:  Discontinued        500 mg Oral  Once 04/23/23 1034 04/23/23 1100   04/22/23 1000  remdesivir  100 mg in sodium chloride  0.9 % 100 mL IVPB  Status:  Discontinued       Note to Pharmacy: Lets resume to complete a 5-day course   100 mg 200 mL/hr over 30 Minutes Intravenous Daily 04/22/23 0842 04/22/23 1111   04/20/23 1000  cefTRIAXone  (ROCEPHIN ) 1 g in sodium chloride  0.9 % 100 mL IVPB  Status:  Discontinued        1 g 200 mL/hr over 30 Minutes Intravenous Every 24 hours 04/19/23 1042 04/19/23 1114   04/20/23 1000  azithromycin  (ZITHROMAX ) tablet 500 mg        500 mg Oral Daily 04/19/23 1113 04/23/23 1210   04/20/23 1000  cefTRIAXone  (ROCEPHIN ) 2 g in sodium chloride  0.9 % 100 mL IVPB        2 g 200 mL/hr over 30 Minutes Intravenous Every 24 hours 04/19/23 1114 04/23/23 1612   04/20/23 1000  remdesivir  100 mg in sodium chloride  0.9 % 100 mL IVPB       Placed in Followed by Linked Group   100 mg 200 mL/hr over 30  Minutes Intravenous Daily 04/19/23 1120 04/21/23 1131   04/19/23 1130  remdesivir  200 mg in sodium chloride  0.9% 250 mL IVPB       Placed in Followed by Linked Group   200 mg 580 mL/hr over 30 Minutes Intravenous Once 04/19/23 1120 04/19/23 1351   04/19/23 1115  cefTRIAXone  (ROCEPHIN ) 1 g in sodium chloride  0.9 % 100 mL IVPB  1 g 200 mL/hr over 30 Minutes Intravenous  Once 04/19/23 1114 04/19/23 1212   04/19/23 1015  cefTRIAXone  (ROCEPHIN ) 1 g in sodium chloride  0.9 % 100 mL IVPB        1 g 200 mL/hr over 30 Minutes Intravenous  Once 04/19/23 1000 04/19/23 1013   04/19/23 1015  azithromycin  (ZITHROMAX ) tablet 500 mg        500 mg Oral  Once 04/19/23 1000 04/19/23 1012       Objective: Vitals:   04/26/23 0309 04/26/23 0759  BP: 136/68 128/70  Pulse: 75 74  Resp: 15 18  Temp: 98.1 F (36.7 C) 98.2 F (36.8 C)  SpO2: 93% 94%    Intake/Output Summary (Last 24 hours) at 04/26/2023 1018 Last data filed at 04/26/2023 0715 Gross per 24 hour  Intake 120 ml  Output 1400 ml  Net -1280 ml   Filed Weights   04/24/23 0532 04/25/23 0629 04/26/23 0309  Weight: 92.7 kg 92.1 kg 92.4 kg   Weight change: 0.252 kg Body mass index is 36.07 kg/m.   Physical Exam: General exam: Pleasant, elderly female. Skin: No rashes, lesions or ulcers. HEENT: Atraumatic, normocephalic, no obvious bleeding Lungs: On heated high flow oxygen.  Clear to auscultation bilaterally.  No wheezing or crackles. CVS: S1, S2, no murmur,   GI/Abd: Soft, nontender, nondistended, bowel sound present,   CNS: Alert, awake, oriented x 3 Psychiatry: Alert, awake, oriented x 3  Extremities: No pedal edema, no calf tenderness,   Data Review: I have personally reviewed the laboratory data and studies available.  F/u labs  Unresulted Labs (From admission, onward)    None       Total time spent in review of labs and imaging, patient evaluation, formulation of plan, documentation and communication with  family: 45 minutes  Signed, Chapman Rota, MD [facility] 04/26/2023

## 2023-04-27 DIAGNOSIS — U071 COVID-19: Secondary | ICD-10-CM | POA: Diagnosis not present

## 2023-04-27 DIAGNOSIS — J1282 Pneumonia due to coronavirus disease 2019: Secondary | ICD-10-CM | POA: Diagnosis not present

## 2023-04-27 NOTE — Plan of Care (Signed)

## 2023-04-27 NOTE — Progress Notes (Signed)
 PROGRESS NOTE  Melanie Armstrong  DOB: November 19, 1955  PCP: Arloa Elsie SAUNDERS, MD FMW:[id number]  DOA: 04/19/2023  LOS: 8 days  Hospital Day: 9  Brief narrative: Melanie Armstrong is a 68 y.o. female with PMH significant for obesity, HTN, GERD, arthritis, asthma 1/5, patient presented to the ED with complaint of shortness of breath, cough, wheezing worsening over a period of week, not responding to clarithromycin  as an outpatient  In the ED, she had a fever of 101.2.  She was severely hypoxic to 70s on room air and required O2 at 10 L by nasal cannula. WBC count normal, lactic acid 1.4, potassium low at 2.9, CRP elevated to 13, procalcitonin negative COVID-19 PCR was positive. CTA chest negative for PE but significant for multifocal infiltrates concerning for multifocal pneumonia.  Patient started empirically on IV remdesivir , IV steroids ceftriaxone  and azithromycin .  Admitted to [facility] Patient's respiratory status continued to worsen and required heated high flow up to 50 L/min at 100% FiO2.  1/7 Actemra  was ordered as well.   Subjective: Patient was seen and examined this morning.  Lying in bed.  Not in distress.  On 35 L heated high flow oxygen.   Daughter not at bedside today. Patient slept well last night.  Assessment and plan: Sepsis POA COVID pneumonia Acute respiratory failure with hypoxia  Presented with worsening shortness of breath, cough for a week COVID PCR positive CTA chest significant for multifocal infiltrates concerning for multifocal pneumonia.  Treatment: Initially started on IV remdesivir , IV steroids. With worsening hypoxia, Actemra  was given on 1/7 and repeated again on 1/8.  Currently continued on IV Solu-Medrol  80 mg daily. Also getting oral Lasix  40 mg daily.    Currently requiring 35 L heated high flow oxygen.  Wean down as tolerated.  FiO2 being titrated down.  Target low O2 sat of mid 80s if able to tolerate without symptoms.  I spoke with respiratory placed to  try that again today. Continue supportive care with scheduled Tessalon  Perles, as needed Robitussin Tylenol , incentive spirometry WBC count elevated due to steroids Procalcitonin not elevated, CRP improving. Blood culture without any growth. Because of the clinical severity, patient was also given 5-day course of empiric antibiotic treatment with ceftriaxone  and azithromycin .  Recent Labs  Lab 04/21/23 0228 04/22/23 0235 04/23/23 0230 04/24/23 0217 04/25/23 0210  WBC 11.1* 13.0* 16.9* 15.6* 14.6*  CRP 7.4* 4.2* 2.5* 1.4*  --   ALT 46* 44 40 36 34   Severe persistent asthma exacerbation Secondary to COVID-19 with pneumonia. Steroids as above Continue bronchodilators.:  Singulair , Claritin , Pulmicort , Brovana , Incruse Ellipta , albuterol    Primary hypertension PTA meds- lisinopril  40 mg daily Currently on Lasix  daily.  Blood pressure in normal range   Anxiety Continue diazepam  PRN   GERD Continue Pepcid   Left upper extremity swelling Minimal swelling noted yesterday, likely because he had IV access on the left side.  Continue to monitor.  If swelling worsens, will obtain ultrasound duplex left upper extremity   Mobility: Continue mobility inside the room  Goals of care   Code Status: Full Code     DVT prophylaxis:  enoxaparin  (LOVENOX ) injection 40 mg Start: 04/19/23 2000   Antimicrobials: Completed the course of Rocephin , azithromycin  Fluid: None Consultants: None  Family Communication: Daughter at bedside  Status: Inpatient Level of care:  Progressive   Patient is from: Home Needs to continue in-hospital care: Severely hypoxic Anticipated d/c to: Pending clinical course      Diet:  Diet  Order             Diet Heart Room service appropriate? Yes; Fluid consistency: Thin  Diet effective now                   Scheduled Meds:  albuterol   2.5 mg Nebulization Q6H   arformoterol   15 mcg Nebulization BID   ascorbic acid   500 mg Oral Daily    benzonatate   100 mg Oral TID   betaxolol   1 drop Both Eyes BID   budesonide  (PULMICORT ) nebulizer solution  0.5 mg Nebulization BID   dorzolamide   1 drop Both Eyes BID   enoxaparin  (LOVENOX ) injection  40 mg Subcutaneous Q24H   famotidine   20 mg Oral BID   furosemide   40 mg Oral q morning   guaiFENesin   600 mg Oral BID   latanoprost   1 drop Both Eyes QHS   loratadine   10 mg Oral Daily   methylPREDNISolone  (SOLU-MEDROL ) injection  80 mg Intravenous Daily   montelukast   10 mg Oral QHS   multivitamin with minerals  1 tablet Oral Daily   sodium chloride  flush  3 mL Intravenous Q12H   umeclidinium bromide   1 puff Inhalation Daily    PRN meds: acetaminophen  **OR** acetaminophen , chlorpheniramine-HYDROcodone , diazepam , guaiFENesin -dextromethorphan    Infusions:     Antimicrobials: Anti-infectives (From admission, onward)    Start     Dose/Rate Route Frequency Ordered Stop   04/23/23 1130  azithromycin  (ZITHROMAX ) tablet 500 mg  Status:  Discontinued        500 mg Oral  Once 04/23/23 1034 04/23/23 1100   04/22/23 1000  remdesivir  100 mg in sodium chloride  0.9 % 100 mL IVPB  Status:  Discontinued       Note to Pharmacy: Lets resume to complete a 5-day course   100 mg 200 mL/hr over 30 Minutes Intravenous Daily 04/22/23 0842 04/22/23 1111   04/20/23 1000  cefTRIAXone  (ROCEPHIN ) 1 g in sodium chloride  0.9 % 100 mL IVPB  Status:  Discontinued        1 g 200 mL/hr over 30 Minutes Intravenous Every 24 hours 04/19/23 1042 04/19/23 1114   04/20/23 1000  azithromycin  (ZITHROMAX ) tablet 500 mg        500 mg Oral Daily 04/19/23 1113 04/23/23 1210   04/20/23 1000  cefTRIAXone  (ROCEPHIN ) 2 g in sodium chloride  0.9 % 100 mL IVPB        2 g 200 mL/hr over 30 Minutes Intravenous Every 24 hours 04/19/23 1114 04/23/23 1612   04/20/23 1000  remdesivir  100 mg in sodium chloride  0.9 % 100 mL IVPB       Placed in Followed by Linked Group   100 mg 200 mL/hr over 30 Minutes Intravenous Daily 04/19/23  1120 04/21/23 1131   04/19/23 1130  remdesivir  200 mg in sodium chloride  0.9% 250 mL IVPB       Placed in Followed by Linked Group   200 mg 580 mL/hr over 30 Minutes Intravenous Once 04/19/23 1120 04/19/23 1351   04/19/23 1115  cefTRIAXone  (ROCEPHIN ) 1 g in sodium chloride  0.9 % 100 mL IVPB        1 g 200 mL/hr over 30 Minutes Intravenous  Once 04/19/23 1114 04/19/23 1212   04/19/23 1015  cefTRIAXone  (ROCEPHIN ) 1 g in sodium chloride  0.9 % 100 mL IVPB        1 g 200 mL/hr over 30 Minutes Intravenous  Once 04/19/23 1000 04/19/23 1013   04/19/23 1015  azithromycin  (ZITHROMAX )  tablet 500 mg        500 mg Oral  Once 04/19/23 1000 04/19/23 1012       Objective: Vitals:   04/27/23 0415 04/27/23 0747  BP: (!) 150/57 (!) 123/58  Pulse: 81 80  Resp: 15 15  Temp: (!) 97.5 F (36.4 C) 98 F (36.7 C)  SpO2: 93% 92%    Intake/Output Summary (Last 24 hours) at 04/27/2023 1147 Last data filed at 04/27/2023 9166 Gross per 24 hour  Intake 817 ml  Output 1100 ml  Net -283 ml   Filed Weights   04/25/23 0629 04/26/23 0309 04/27/23 0747  Weight: 92.1 kg 92.4 kg 91.6 kg   Weight change:  Body mass index is 35.77 kg/m.   Physical Exam: General exam: Pleasant, elderly female. Skin: No rashes, lesions or ulcers. HEENT: Atraumatic, normocephalic, no obvious bleeding Lungs: On heated high flow oxygen.  Clear to auscultation bilaterally.  No wheezing or crackles. CVS: S1, S2, no murmur,   GI/Abd: Soft, nontender, nondistended, bowel sound present,   CNS: Alert, awake, oriented x 3 Psychiatry: Alert, awake, oriented x 3  Extremities: No pedal edema, no calf tenderness.  Minimal swelling of left upper extremity  Data Review: I have personally reviewed the laboratory data and studies available.  F/u labs  Unresulted Labs (From admission, onward)    None       Total time spent in review of labs and imaging, patient evaluation, formulation of plan, documentation and communication with  family: 45 minutes  Signed, Chapman Rota, MD [facility] 04/27/2023

## 2023-04-27 NOTE — Plan of Care (Signed)
  Problem: Education: Goal: Knowledge of risk factors and measures for prevention of condition will improve Outcome: Progressing   Problem: Coping: Goal: Psychosocial and spiritual needs will be supported Outcome: Progressing   Problem: Education: Goal: Knowledge of General Education information will improve Description: Including pain rating scale, medication(s)/side effects and non-pharmacologic comfort measures Outcome: Progressing   Problem: Health Behavior/Discharge Planning: Goal: Ability to manage health-related needs will improve Outcome: Progressing   Problem: Clinical Measurements: Goal: Will remain free from infection Outcome: Progressing Goal: Diagnostic test results will improve Outcome: Progressing Goal: Respiratory complications will improve Outcome: Progressing   Problem: Activity: Goal: Risk for activity intolerance will decrease Outcome: Progressing   Problem: Nutrition: Goal: Adequate nutrition will be maintained Outcome: Progressing

## 2023-04-27 NOTE — Progress Notes (Signed)
 Pt was titrated from HHFNC to HFNC 8L and is tolerating well at this time.

## 2023-04-28 DIAGNOSIS — U071 COVID-19: Secondary | ICD-10-CM | POA: Diagnosis not present

## 2023-04-28 DIAGNOSIS — J1282 Pneumonia due to coronavirus disease 2019: Secondary | ICD-10-CM | POA: Diagnosis not present

## 2023-04-28 MED ORDER — ALBUTEROL SULFATE (2.5 MG/3ML) 0.083% IN NEBU
2.5000 mg | INHALATION_SOLUTION | Freq: Two times a day (BID) | RESPIRATORY_TRACT | Status: DC
  Administered 2023-04-28 – 2023-04-30 (×4): 2.5 mg via RESPIRATORY_TRACT
  Filled 2023-04-28 (×4): qty 3

## 2023-04-28 NOTE — Plan of Care (Signed)

## 2023-04-28 NOTE — Progress Notes (Signed)
 PROGRESS NOTE  Melanie Armstrong  DOB: [birth year]  PCP: Arloa Elsie SAUNDERS, MD FMW:[id number]  DOA: 04/19/2023  LOS: 9 days  Hospital Day: 10  Brief narrative: Melanie Armstrong is a 68 y.o. female with PMH significant for obesity, HTN, GERD, arthritis, asthma 1/5, patient presented to the ED with complaint of shortness of breath, cough, wheezing worsening over a period of week, not responding to clarithromycin  as an outpatient  In the ED, she had a fever of 101.2.  She was severely hypoxic to 70s on room air and required O2 at 10 L by nasal cannula. WBC count normal, lactic acid 1.4, potassium low at 2.9, CRP elevated to 13, procalcitonin negative COVID-19 PCR was positive. CTA chest negative for PE but significant for multifocal infiltrates concerning for multifocal pneumonia.  Patient started empirically on IV remdesivir , IV steroids ceftriaxone  and azithromycin .  Admitted to [facility] Patient's respiratory status continued to worsen and required heated high flow up to 50 L/min at 100% FiO2.  1/7 Actemra  was ordered as well.   Subjective: Patient was seen and examined this morning.  Lying on bed.  Not in distress. Maintaining oxygen saturation more than 90% on 5 L by nasal cannula.  Big change in last 24 hours.  Patient is excited with the improvement.  Assessment and plan: Sepsis POA COVID pneumonia Acute respiratory failure with hypoxia  Presented with worsening shortness of breath, cough for a week COVID PCR positive CTA chest significant for multifocal infiltrates concerning for multifocal pneumonia.  Treatment: Initially started on IV remdesivir , IV steroids. With worsening hypoxia, Actemra  was given on 1/7 and repeated again on 1/8.  Currently continued on IV Solu-Medrol  80 mg daily. Also getting oral Lasix  40 mg daily.    As of yesterday morning, patient was requiring 35 L heated high flow oxygen.  Yesterday afternoon, respiratory therapist was able to significantly wean her down to 5  L O2 by nasal cannula.  She remained stable on 5 L overnight.  I have suggested to monitor for ambulatory oxygen requirement.  If able to walk without symptoms and on less than 6 L, she probably can be discharged home in next 1 to 2 days.  Continue supportive care with scheduled Tessalon  Perles, as needed Robitussin Tylenol , incentive spirometry WBC count elevated due to steroids Procalcitonin not elevated, CRP improving. Blood culture without any growth. Because of the clinical severity, patient was also given 5-day course of empiric antibiotic treatment with ceftriaxone  and azithromycin .  Completed on 1/9. Recent Labs  Lab 04/22/23 0235 04/23/23 0230 04/24/23 0217 04/25/23 0210  WBC 13.0* 16.9* 15.6* 14.6*  CRP 4.2* 2.5* 1.4*  --   ALT 44 40 36 34   Severe persistent asthma exacerbation Secondary to COVID-19 with pneumonia. Steroids as above Continue bronchodilators.:  Singulair , Claritin , Pulmicort , Brovana , Incruse Ellipta , albuterol    Primary hypertension PTA meds- lisinopril  40 mg daily Currently on Lasix  daily.  Blood pressure in normal range   Anxiety Continue diazepam  PRN   GERD Continue Pepcid   Left upper extremity swelling Minimal swelling noted 2 days ago, likely because she had IV access on the left side.  Continue to monitor.  If swelling worsens, will obtain ultrasound duplex left upper extremity   Mobility: Continue mobility inside the room  Goals of care   Code Status: Full Code     DVT prophylaxis:  enoxaparin  (LOVENOX ) injection 40 mg Start: 04/19/23 2000   Antimicrobials: Completed the course of Rocephin , azithromycin  Fluid: None Consultants: None  Family  Communication: Daughter not at bedside  Status: Inpatient Level of care:  Progressive   Patient is from: Home Needs to continue in-hospital care: Improving hypoxia Anticipated d/c to: Pending clinical course      Diet:  Diet Order             Diet Heart Room service appropriate?  Yes; Fluid consistency: Thin  Diet effective now                   Scheduled Meds:  albuterol   2.5 mg Nebulization BID   arformoterol   15 mcg Nebulization BID   ascorbic acid   500 mg Oral Daily   benzonatate   100 mg Oral TID   betaxolol   1 drop Both Eyes BID   budesonide  (PULMICORT ) nebulizer solution  0.5 mg Nebulization BID   dorzolamide   1 drop Both Eyes BID   enoxaparin  (LOVENOX ) injection  40 mg Subcutaneous Q24H   famotidine   20 mg Oral BID   furosemide   40 mg Oral q morning   guaiFENesin   600 mg Oral BID   latanoprost   1 drop Both Eyes QHS   loratadine   10 mg Oral Daily   methylPREDNISolone  (SOLU-MEDROL ) injection  80 mg Intravenous Daily   montelukast   10 mg Oral QHS   multivitamin with minerals  1 tablet Oral Daily   sodium chloride  flush  3 mL Intravenous Q12H   umeclidinium bromide   1 puff Inhalation Daily    PRN meds: acetaminophen  **OR** acetaminophen , chlorpheniramine-HYDROcodone , diazepam , guaiFENesin -dextromethorphan    Infusions:     Antimicrobials: Anti-infectives (From admission, onward)    Start     Dose/Rate Route Frequency Ordered Stop   04/23/23 1130  azithromycin  (ZITHROMAX ) tablet 500 mg  Status:  Discontinued        500 mg Oral  Once 04/23/23 1034 04/23/23 1100   04/22/23 1000  remdesivir  100 mg in sodium chloride  0.9 % 100 mL IVPB  Status:  Discontinued       Note to Pharmacy: Lets resume to complete a 5-day course   100 mg 200 mL/hr over 30 Minutes Intravenous Daily 04/22/23 0842 04/22/23 1111   04/20/23 1000  cefTRIAXone  (ROCEPHIN ) 1 g in sodium chloride  0.9 % 100 mL IVPB  Status:  Discontinued        1 g 200 mL/hr over 30 Minutes Intravenous Every 24 hours 04/19/23 1042 04/19/23 1114   04/20/23 1000  azithromycin  (ZITHROMAX ) tablet 500 mg        500 mg Oral Daily 04/19/23 1113 04/23/23 1210   04/20/23 1000  cefTRIAXone  (ROCEPHIN ) 2 g in sodium chloride  0.9 % 100 mL IVPB        2 g 200 mL/hr over 30 Minutes Intravenous Every 24 hours  04/19/23 1114 04/23/23 1612   04/20/23 1000  remdesivir  100 mg in sodium chloride  0.9 % 100 mL IVPB       Placed in Followed by Linked Group   100 mg 200 mL/hr over 30 Minutes Intravenous Daily 04/19/23 1120 04/21/23 1131   04/19/23 1130  remdesivir  200 mg in sodium chloride  0.9% 250 mL IVPB       Placed in Followed by Linked Group   200 mg 580 mL/hr over 30 Minutes Intravenous Once 04/19/23 1120 04/19/23 1351   04/19/23 1115  cefTRIAXone  (ROCEPHIN ) 1 g in sodium chloride  0.9 % 100 mL IVPB        1 g 200 mL/hr over 30 Minutes Intravenous  Once 04/19/23 1114 04/19/23 1212   04/19/23 1015  cefTRIAXone  (ROCEPHIN ) 1 g in sodium chloride  0.9 % 100 mL IVPB        1 g 200 mL/hr over 30 Minutes Intravenous  Once 04/19/23 1000 04/19/23 1013   04/19/23 1015  azithromycin  (ZITHROMAX ) tablet 500 mg        500 mg Oral  Once 04/19/23 1000 04/19/23 1012       Objective: Vitals:   04/28/23 0503 04/28/23 0819  BP:  (!) 141/66  Pulse: 65 74  Resp: 14 17  Temp:  97.7 F (36.5 C)  SpO2: 96% 93%    Intake/Output Summary (Last 24 hours) at 04/28/2023 1112 Last data filed at 04/28/2023 0500 Gross per 24 hour  Intake 620 ml  Output 1100 ml  Net -480 ml   Filed Weights   04/26/23 0309 04/27/23 0747 04/28/23 0503  Weight: 92.4 kg 91.6 kg 90.7 kg   Weight change:  Body mass index is 35.42 kg/m.   Physical Exam: General exam: Pleasant, elderly female.  Not in distress Skin: No rashes, lesions or ulcers. HEENT: Atraumatic, normocephalic, no obvious bleeding Lungs: On 5 L/min O2 by nasal cannula today.  Clear to auscultation bilaterally.  No wheezing or crackles. CVS: S1, S2, no murmur,   GI/Abd: Soft, nontender, nondistended, bowel sound present,   CNS: Alert, awake, oriented x 3 Psychiatry: Alert, awake, oriented x 3  Extremities: No pedal edema, no calf tenderness.  Minimal swelling of left upper extremity  Data Review: I have personally reviewed the laboratory data and studies  available.  F/u labs  Unresulted Labs (From admission, onward)    None       Total time spent in review of labs and imaging, patient evaluation, formulation of plan, documentation and communication with family: 45 minutes  Signed, Chapman Rota, MD [facility] 04/28/2023

## 2023-04-28 NOTE — Evaluation (Signed)
 Physical Therapy Evaluation Patient Details Name: Melanie Armstrong MRN: [id number] DOB: [birth year] Today's Date: 04/28/2023  History of Present Illness  Pt is 68 yo female who presents on 04/19/23 with SOB, cough, wheezing. PNA and Covid +. PMH: obesity, HTN, GERD, arthritis, asthma, B THA, L TKA, R RTC repair  Clinical Impression  Pt admitted with above diagnosis. Pt from home independently. Lives with husband, drives, has DME from past surgeries but was not currently using. On 5L HFNC on eval and desats to 79% within 2 mins of being on RA. Takes >5 mins on 5L HF to return to 88%. Will follow acutely but do not anticipate her needing PT at d/c.  Pt currently with functional limitations due to the deficits listed below (see PT Problem List). Pt will benefit from acute skilled PT to increase their independence and safety with mobility to allow discharge.           If plan is discharge home, recommend the following: A little help with walking and/or transfers;A little help with bathing/dressing/bathroom;Assist for transportation;Help with stairs or ramp for entrance   Can travel by private vehicle        Equipment Recommendations None recommended by PT  Recommendations for Other Services       Functional Status Assessment Patient has had a recent decline in their functional status and demonstrates the ability to make significant improvements in function in a reasonable and predictable amount of time.     Precautions / Restrictions Precautions Precautions: Other (comment) Precaution Comments: watch O2 sats Restrictions Weight Bearing Restrictions Per Provider Order: No      Mobility  Bed Mobility Overal bed mobility: Needs Assistance Bed Mobility: Supine to Sit     Supine to sit: Supervision     General bed mobility comments: increased time, no physical assist    Transfers Overall transfer level: Needs assistance Equipment used: 1 person hand held assist Transfers: Sit  to/from Stand Sit to Stand: Min assist           General transfer comment: min A to steady    Ambulation/Gait Ambulation/Gait assistance: Min assist Gait Distance (Feet): 3 Feet Assistive device: 1 person hand held assist Gait Pattern/deviations: Wide base of support Gait velocity: decreased Gait velocity interpretation: <1.31 ft/sec, indicative of household ambulator   General Gait Details: SOB with mobility. Quick fatigue  Stairs            Wheelchair Mobility     Tilt Bed    Modified Rankin (Stroke Patients Only)       Balance Overall balance assessment: Mild deficits observed, not formally tested                                           Pertinent Vitals/Pain Pain Assessment Pain Assessment: No/denies pain    Home Living Family/patient expects to be discharged to:: Private residence Living Arrangements: Spouse/significant other;Children Available Help at Discharge: Family;Available 24 hours/day Type of Home: House Home Access: Stairs to enter Entrance Stairs-Rails: Right Entrance Stairs-Number of Steps: 5   Home Layout: One level Home Equipment: Agricultural Consultant (2 wheels);Shower seat;Cane - single point;BSC/3in1 Additional Comments: pt has equipment from multiple orthopedic surgeries    Prior Function Prior Level of Function : Independent/Modified Independent;Driving             Mobility Comments: does not use AD  Extremity/Trunk Assessment   Upper Extremity Assessment Upper Extremity Assessment: Generalized weakness    Lower Extremity Assessment Lower Extremity Assessment: Generalized weakness    Cervical / Trunk Assessment Cervical / Trunk Assessment: Other exceptions Cervical / Trunk Exceptions: large body habitus  Communication   Communication Communication: No apparent difficulties  Cognition Arousal: Alert Behavior During Therapy: WFL for tasks assessed/performed Overall Cognitive Status: Within  Functional Limits for tasks assessed                                          General Comments General comments (skin integrity, edema, etc.): education on frequent position changes, use of IS, sitting in chair or EOB for meals, and progressive mobility    Exercises     Assessment/Plan    PT Assessment Patient needs continued PT services  PT Problem List Cardiopulmonary status limiting activity;Decreased activity tolerance       PT Treatment Interventions DME instruction;Gait training;Stair training;Functional mobility training;Therapeutic activities;Therapeutic exercise;Patient/family education    PT Goals (Current goals can be found in the Care Plan section)  Acute Rehab PT Goals Patient Stated Goal: breathe better, be independent PT Goal Formulation: With patient Time For Goal Achievement: 05/12/23 Potential to Achieve Goals: Good    Frequency Min 1X/week     Co-evaluation               AM-PAC PT 6 Clicks Mobility  Outcome Measure Help needed turning from your back to your side while in a flat bed without using bedrails?: None Help needed moving from lying on your back to sitting on the side of a flat bed without using bedrails?: None Help needed moving to and from a bed to a chair (including a wheelchair)?: A Little Help needed standing up from a chair using your arms (e.g., wheelchair or bedside chair)?: A Little Help needed to walk in hospital room?: A Little Help needed climbing 3-5 steps with a railing? : A Lot 6 Click Score: 19    End of Session Equipment Utilized During Treatment: Oxygen Activity Tolerance: Patient limited by fatigue Patient left: in bed;with call bell/phone within reach Nurse Communication: Mobility status PT Visit Diagnosis: Difficulty in walking, not elsewhere classified (R26.2)    Time: 8853-8783 PT Time Calculation (min) (ACUTE ONLY): 30 min   Charges:   PT Evaluation $PT Eval Moderate Complexity: 1  Mod PT Treatments $Gait Training: 8-22 mins PT General Charges $$ ACUTE PT VISIT: 1 Visit         Richerd Lipoma, PT  Acute Rehab Services Secure chat preferred Office [phone]   Richerd CROME Navi Erber 04/28/2023, 2:08 PM

## 2023-04-29 ENCOUNTER — Ambulatory Visit: Payer: Medicare Other

## 2023-04-29 LAB — CBC WITH DIFFERENTIAL/PLATELET
Abs Immature Granulocytes: 0.11 10*3/uL — ABNORMAL HIGH (ref 0.00–0.07)
Basophils Absolute: 0 10*3/uL (ref 0.0–0.1)
Basophils Relative: 0 %
Eosinophils Absolute: 0 10*3/uL (ref 0.0–0.5)
Eosinophils Relative: 0 %
HCT: 40.8 % (ref 36.0–46.0)
Hemoglobin: 13.4 g/dL (ref 12.0–15.0)
Immature Granulocytes: 1 %
Lymphocytes Relative: 13 %
Lymphs Abs: 1.3 10*3/uL (ref 0.7–4.0)
MCH: 31.4 pg (ref 26.0–34.0)
MCHC: 32.8 g/dL (ref 30.0–36.0)
MCV: 95.6 fL (ref 80.0–100.0)
Monocytes Absolute: 0.8 10*3/uL (ref 0.1–1.0)
Monocytes Relative: 9 %
Neutro Abs: 7.6 10*3/uL (ref 1.7–7.7)
Neutrophils Relative %: 77 %
Platelets: 223 10*3/uL (ref 150–400)
RBC: 4.27 MIL/uL (ref 3.87–5.11)
RDW: 12.5 % (ref 11.5–15.5)
WBC: 9.8 10*3/uL (ref 4.0–10.5)
nRBC: 0 % (ref 0.0–0.2)

## 2023-04-29 LAB — BLOOD GAS, ARTERIAL
Acid-Base Excess: 5 mmol/L — ABNORMAL HIGH (ref 0.0–2.0)
Bicarbonate: 29.9 mmol/L — ABNORMAL HIGH (ref 20.0–28.0)
O2 Saturation: 97.2 %
Patient temperature: 36.9
pCO2 arterial: 44 mm[Hg] (ref 32–48)
pH, Arterial: 7.44 (ref 7.35–7.45)
pO2, Arterial: 76 mm[Hg] — ABNORMAL LOW (ref 83–108)

## 2023-04-29 LAB — BASIC METABOLIC PANEL
Anion gap: 9 (ref 5–15)
BUN: 19 mg/dL (ref 8–23)
CO2: 29 mmol/L (ref 22–32)
Calcium: 8.8 mg/dL — ABNORMAL LOW (ref 8.9–10.3)
Chloride: 100 mmol/L (ref 98–111)
Creatinine, Ser: 0.59 mg/dL (ref 0.44–1.00)
GFR, Estimated: >60 mL/min (ref >60)
Glucose, Bld: 116 mg/dL — ABNORMAL HIGH (ref 70–99)
Potassium: 4.3 mmol/L (ref 3.5–5.1)
Sodium: 138 mmol/L (ref 135–145)

## 2023-04-29 NOTE — Plan of Care (Signed)
  Problem: Education: Goal: Knowledge of risk factors and measures for prevention of condition will improve Outcome: Progressing   Problem: Respiratory: Goal: Will maintain a patent airway Outcome: Progressing   Problem: Education: Goal: Knowledge of General Education information will improve Description: Including pain rating scale, medication(s)/side effects and non-pharmacologic comfort measures Outcome: Progressing   Problem: Clinical Measurements: Goal: Respiratory complications will improve Outcome: Progressing   Problem: Nutrition: Goal: Adequate nutrition will be maintained Outcome: Progressing   Problem: Pain Management: Goal: General experience of comfort will improve Outcome: Progressing

## 2023-04-29 NOTE — Progress Notes (Signed)
 Mobility Specialist Progress Note;   04/29/23 0920  Mobility  Activity Ambulated with assistance in room  Level of Assistance Contact guard assist, steadying assist  Assistive Device Other (Comment) (HHA)  Distance Ambulated (ft) 20 ft  Activity Response Tolerated well  Mobility Referral Yes  Mobility visit 1 Mobility  Mobility Specialist Start Time (ACUTE ONLY) 0920  Mobility Specialist Stop Time (ACUTE ONLY) 0930  Mobility Specialist Time Calculation (min) (ACUTE ONLY) 10 min   Pt agreeable to mobility. On BSC upon arrival, void successful. On 4LO2. Required MinG assistance via HHA during ambulation in room. Ambulated on 4LO2. SPO2 occasionally desat to ~83%, however would recover 88%> within ~1 min. Displayed cough during session, otherwise asx. Pt left in chair with all needs met, on 4LO2.   Janit Meline Mobility Specialist Please contact via SecureChat or [employer] [phone]

## 2023-04-29 NOTE — Progress Notes (Signed)
 PROGRESS NOTE  Melanie Armstrong  DOB: [birth year]  PCP: Roselind Congo, MD GLO:[id number]  DOA: 04/19/2023  LOS: 10 days  Hospital Day: 11  Brief narrative: Melanie Armstrong is a 68 y.o. female with PMH significant for obesity, HTN, GERD, arthritis, asthma 1/5, patient presented to the ED with complaint of shortness of breath, cough, wheezing worsening over a period of week, not responding to clarithromycin  as an outpatient  In the ED, she had a fever of 101.2.  She was severely hypoxic to 70s on room air and required O2 at 10 L by nasal cannula. WBC count normal, lactic acid 1.4, potassium low at 2.9, CRP elevated to 13, procalcitonin negative COVID-19 PCR was positive. CTA chest negative for PE but significant for multifocal infiltrates concerning for multifocal pneumonia.  Patient started empirically on IV remdesivir , IV steroids ceftriaxone  and azithromycin .  Admitted to [facility] Patient's respiratory status continued to worsen and required heated high flow up to 50 L/min at 100% FiO2.  1/7 Actemra  was ordered as well.   Subjective: Denies chest pain feels her dyspnea is improving.   Assessment and plan: Sepsis POA Resolved  COVID pneumonia Resolving  Acute respiratory failure with hypoxia  Presented with worsening shortness of breath, cough for a week COVID PCR positive CTA chest significant for multifocal infiltrates concerning for multifocal pneumonia.  Treatment: Initially started on IV remdesivir , IV steroids. With worsening hypoxia, Actemra  was given on 1/7 and repeated again on 1/8.  Currently continued on IV Solu-Medrol  80 mg daily. Also getting oral Lasix  40 mg daily.   Because of the clinical severity, patient was also given 5-day course of empiric antibiotic treatment with ceftriaxone  and azithromycin .  Completed on 1/9. Procalcitonin not elevated, CRP improving. Blood culture without any growth.  Required up to 35 L of heated high flow oxygen about 2 days ago.  Currently  requiring 4 to 5 L of oxygen.  Her O2 sats however do remain in the low 90s.   Continue supportive care with scheduled Tessalon  Perles, as needed Robitussin Tylenol , incentive spirometry Will need ambulatory O2 sats Will check ABG to see if correlates with pulse oximeter Mobilize    Recent Labs  Lab 04/23/23 0230 04/24/23 0217 04/25/23 0210 04/29/23 0232  WBC 16.9* 15.6* 14.6* 9.8  CRP 2.5* 1.4*  --   --   ALT 40 36 34  --    Severe persistent asthma exacerbation Secondary to COVID-19 with pneumonia. Currently without any wheezing on exam. Steroids as above Continue bronchodilators.:  Singulair , Claritin , Pulmicort , Brovana , Incruse Ellipta , albuterol    Primary hypertension PTA meds- lisinopril  40 mg daily Currently on Lasix  daily.  Blood pressure mostly normotensive Continue to hold ACE    Anxiety Continue diazepam  PRN   GERD Continue Pepcid   Left upper extremity swelling Minimal swelling noted 2 days ago, likely because she had IV access on the left side.  Continue to monitor.  If swelling worsens, will obtain ultrasound duplex left upper extremity   Mobility: Continue mobility inside the room  Goals of care   Code Status: Full Code     DVT prophylaxis:  enoxaparin  (LOVENOX ) injection 40 mg Start: 04/19/23 2000   Antimicrobials: Completed course of Rocephin , azithromycin  Fluid: None Consultants: None  Family Communication: No family present at bedside   Status: Inpatient Level of care:  Progressive   Patient is from: Home Needs to continue in-hospital care: Improving hypoxia Anticipated d/c to: Pending clinical course      Diet:  Diet  Order             Diet Heart Room service appropriate? Yes; Fluid consistency: Thin  Diet effective now                   Scheduled Meds:  albuterol   2.5 mg Nebulization BID   arformoterol   15 mcg Nebulization BID   ascorbic acid   500 mg Oral Daily   benzonatate   100 mg Oral TID   betaxolol   1 drop  Both Eyes BID   budesonide  (PULMICORT ) nebulizer solution  0.5 mg Nebulization BID   dorzolamide   1 drop Both Eyes BID   enoxaparin  (LOVENOX ) injection  40 mg Subcutaneous Q24H   famotidine   20 mg Oral BID   furosemide   40 mg Oral q morning   guaiFENesin   600 mg Oral BID   latanoprost   1 drop Both Eyes QHS   loratadine   10 mg Oral Daily   methylPREDNISolone  (SOLU-MEDROL ) injection  80 mg Intravenous Daily   montelukast   10 mg Oral QHS   multivitamin with minerals  1 tablet Oral Daily   sodium chloride  flush  3 mL Intravenous Q12H   umeclidinium bromide   1 puff Inhalation Daily    PRN meds: acetaminophen  **OR** acetaminophen , chlorpheniramine-HYDROcodone , diazepam , guaiFENesin -dextromethorphan    Infusions:     Antimicrobials: Anti-infectives (From admission, onward)    Start     Dose/Rate Route Frequency Ordered Stop   04/23/23 1130  azithromycin  (ZITHROMAX ) tablet 500 mg  Status:  Discontinued        500 mg Oral  Once 04/23/23 1034 04/23/23 1100   04/22/23 1000  remdesivir  100 mg in sodium chloride  0.9 % 100 mL IVPB  Status:  Discontinued       Note to Pharmacy: Lets resume to complete a 5-day course   100 mg 200 mL/hr over 30 Minutes Intravenous Daily 04/22/23 0842 04/22/23 1111   04/20/23 1000  cefTRIAXone  (ROCEPHIN ) 1 g in sodium chloride  0.9 % 100 mL IVPB  Status:  Discontinued        1 g 200 mL/hr over 30 Minutes Intravenous Every 24 hours 04/19/23 1042 04/19/23 1114   04/20/23 1000  azithromycin  (ZITHROMAX ) tablet 500 mg        500 mg Oral Daily 04/19/23 1113 04/23/23 1210   04/20/23 1000  cefTRIAXone  (ROCEPHIN ) 2 g in sodium chloride  0.9 % 100 mL IVPB        2 g 200 mL/hr over 30 Minutes Intravenous Every 24 hours 04/19/23 1114 04/23/23 1612   04/20/23 1000  remdesivir  100 mg in sodium chloride  0.9 % 100 mL IVPB       Placed in "Followed by" Linked Group   100 mg 200 mL/hr over 30 Minutes Intravenous Daily 04/19/23 1120 04/21/23 1131   04/19/23 1130  remdesivir  200  mg in sodium chloride  0.9% 250 mL IVPB       Placed in "Followed by" Linked Group   200 mg 580 mL/hr over 30 Minutes Intravenous Once 04/19/23 1120 04/19/23 1351   04/19/23 1115  cefTRIAXone  (ROCEPHIN ) 1 g in sodium chloride  0.9 % 100 mL IVPB        1 g 200 mL/hr over 30 Minutes Intravenous  Once 04/19/23 1114 04/19/23 1212   04/19/23 1015  cefTRIAXone  (ROCEPHIN ) 1 g in sodium chloride  0.9 % 100 mL IVPB        1 g 200 mL/hr over 30 Minutes Intravenous  Once 04/19/23 1000 04/19/23 1013   04/19/23 1015  azithromycin  (ZITHROMAX )  tablet 500 mg        500 mg Oral  Once 04/19/23 1000 04/19/23 1012       Objective: Vitals:   04/29/23 0700 04/29/23 0900  BP: (!) 152/62   Pulse: 66 94  Resp: (!) 23 18  Temp: 97.9 F (36.6 C)   SpO2: 94% 92%    Intake/Output Summary (Last 24 hours) at 04/29/2023 1645 Last data filed at 04/29/2023 1536 Gross per 24 hour  Intake --  Output 1300 ml  Net -1300 ml   Filed Weights   04/27/23 0747 04/28/23 0503 04/29/23 0430  Weight: 91.6 kg 90.7 kg 90.1 kg   Weight change: -1.5 kg Body mass index is 35.19 kg/m.   Physical Exam:  Physical Exam  Constitutional: In no distress.  Cardiovascular: Normal rate, regular rhythm. No lower extremity edema  Pulmonary: Non labored breathing on [place], no wheezing or rales.   Abdominal: Soft. Normal bowel sounds. Non distended and non tender Musculoskeletal: Normal range of motion.     Neurological: Alert and oriented to person, place, and time. Non focal  Skin: Skin is warm and dry.   Data Review: I have personally reviewed the laboratory data and studies available.  F/u labs  Unresulted Labs (From admission, onward)     Start     Ordered   04/30/23 0500  CBC  Tomorrow morning,   R       Question:  Specimen collection method  Answer:  Lab=Lab collect   04/29/23 1645   04/30/23 0500  Basic metabolic panel  Tomorrow morning,   R       Question:  Specimen collection method  Answer:  Lab=Lab collect    04/29/23 1645   04/30/23 0500  Brain natriuretic peptide  Tomorrow morning,   R       Question:  Specimen collection method  Answer:  Lab=Lab collect   04/29/23 1645   04/29/23 1634  Blood gas, arterial  Once,   R        04/29/23 1633            Total time spent in review of labs and imaging, patient evaluation, formulation of plan, documentation: 35 minutes  Signed, Joette Mustard, MD [facility] 04/29/2023

## 2023-04-29 NOTE — Plan of Care (Signed)

## 2023-04-29 NOTE — TOC Progression Note (Signed)
 Transition of Care [facility]) - Progression Note    Patient Details  Name: Melanie Armstrong MRN: [id number] Date of Birth: 03-29-1956  Transition of Care [facility]) CM/SW Contact  Omie Bickers, RN Phone Number: 04/29/2023, 11:54 AM  Clinical Narrative:     Anticipate patient may need home oxygen. I have requested ambulatory sats from nursing staff.   Expected Discharge Plan: Home w Home Health Services Barriers to Discharge: Continued Medical Work up  Expected Discharge Plan and Services       Living arrangements for the past 2 months: Single Family Home                                       Social Determinants of Health (SDOH) Interventions SDOH Screenings   Food Insecurity: No Food Insecurity (04/20/2023)  Housing: Patient Declined (04/20/2023)  Transportation Needs: No Transportation Needs (04/20/2023)  Utilities: Patient Declined (04/20/2023)  Social Connections: Unknown (04/20/2023)  Tobacco Use: Low Risk  (04/20/2023)    Readmission Risk Interventions     No data to display

## 2023-04-30 ENCOUNTER — Other Ambulatory Visit (HOSPITAL_COMMUNITY): Payer: Self-pay

## 2023-04-30 LAB — BASIC METABOLIC PANEL
Anion gap: 10 (ref 5–15)
BUN: 20 mg/dL (ref 8–23)
CO2: 28 mmol/L (ref 22–32)
Calcium: 8.6 mg/dL — ABNORMAL LOW (ref 8.9–10.3)
Chloride: 97 mmol/L — ABNORMAL LOW (ref 98–111)
Creatinine, Ser: 0.79 mg/dL (ref 0.44–1.00)
GFR, Estimated: >60 mL/min (ref >60)
Glucose, Bld: 111 mg/dL — ABNORMAL HIGH (ref 70–99)
Potassium: 3.8 mmol/L (ref 3.5–5.1)
Sodium: 135 mmol/L (ref 135–145)

## 2023-04-30 LAB — CBC
HCT: 40.6 % (ref 36.0–46.0)
Hemoglobin: 13.3 g/dL (ref 12.0–15.0)
MCH: 31.3 pg (ref 26.0–34.0)
MCHC: 32.8 g/dL (ref 30.0–36.0)
MCV: 95.5 fL (ref 80.0–100.0)
Platelets: 202 10*3/uL (ref 150–400)
RBC: 4.25 MIL/uL (ref 3.87–5.11)
RDW: 12.4 % (ref 11.5–15.5)
WBC: 8 10*3/uL (ref 4.0–10.5)
nRBC: 0 % (ref 0.0–0.2)

## 2023-04-30 LAB — BRAIN NATRIURETIC PEPTIDE: B Natriuretic Peptide: 41.2 pg/mL (ref 0.0–100.0)

## 2023-04-30 MED ORDER — PREDNISONE 20 MG PO TABS: 80.0000 mg | ORAL_TABLET | Freq: Every day | ORAL | Status: DC

## 2023-04-30 MED ORDER — PREDNISONE 10 MG PO TABS: ORAL_TABLET | ORAL | 0 refills | Status: AC

## 2023-04-30 MED ORDER — HYDROCOD POLI-CHLORPHE POLI ER 10-8 MG/5ML PO SUER: 5.0000 mL | Freq: Every evening | ORAL | 0 refills | Status: AC | PRN

## 2023-04-30 MED ORDER — PREDNISONE 20 MG PO TABS
80.0000 mg | ORAL_TABLET | Freq: Once | ORAL | Status: AC
  Administered 2023-04-30: 80 mg via ORAL
  Filled 2023-04-30: qty 4

## 2023-04-30 NOTE — Progress Notes (Signed)
Patient provided with verbal discharge instructions. Paper copy of discharge provided to patient. RN answered all questions. VSS at discharge. IV removed. Patient belongings sent with patient. Printed scripts given to patient for medications. Patient dc'd via wheelchair to private vehicle.

## 2023-04-30 NOTE — Progress Notes (Signed)
 Entered in error

## 2023-04-30 NOTE — Progress Notes (Signed)
Mobility Specialist Progress Note;   04/30/23 1000  Mobility  Activity Ambulated with assistance in room  Level of Assistance Contact guard assist, steadying assist  Assistive Device Other (Comment) (HHA)  Distance Ambulated (ft) 30 ft  Activity Response Tolerated well  Mobility Referral Yes  Mobility visit 1 Mobility  Mobility Specialist Start Time (ACUTE ONLY) 1000  Mobility Specialist Stop Time (ACUTE ONLY) 1013  Mobility Specialist Time Calculation (min) (ACUTE ONLY) 13 min   Pt agreeable to mobility. On 3LO2 upon arrival. Required MinG assistance via HHA during ambulation. Ambulated on 4LO2 to stay [facility]. Pt displaying some fatigue and SOB at EOS. Pt returned safely back to chair with all needs met, on 3LO2 VSS.   Caesar Bookman Mobility Specialist Please contact via SecureChat or [employer] [phone]

## 2023-04-30 NOTE — TOC Progression Note (Signed)
Transition of Care (TOC) - Progression Note   Late note   NCM was secure chatted for home oxygen. NCM requested order and oxygen ambulation saturation note .   MD has now entered oxygen order  Patient Details  Name: Melanie Armstrong MRN: [id number] Date of Birth: March 19, 1956  Transition of Care [facility]) CM/SW Contact  Leta Bucklin, Adria Devon, RN Phone Number: 04/30/2023, 10:03 AM  Clinical Narrative:       Expected Discharge Plan: Home w Home Health Services Barriers to Discharge: Continued Medical Work up  Expected Discharge Plan and Services       Living arrangements for the past 2 months: Single Family Home                                       Social Determinants of Health (SDOH) Interventions SDOH Screenings   Food Insecurity: No Food Insecurity (04/20/2023)  Housing: Patient Declined (04/20/2023)  Transportation Needs: No Transportation Needs (04/20/2023)  Utilities: Patient Declined (04/20/2023)  Social Connections: Unknown (04/20/2023)  Tobacco Use: Low Risk  (04/20/2023)    Readmission Risk Interventions     No data to display

## 2023-04-30 NOTE — TOC Progression Note (Signed)
Transition of Care (TOC) - Progression Note   Home oxygen ordered with Mitch with [employer].   Discussed home oxygen with patient .    Home oxygen ordered with Mitch with [employer].  Patient Details  Name: [name] MRN: [id number] Date of Birth: 1955/10/11  Transition of Care [facility]) CM/SW Contact  Thatiana Renbarger, Adria Devon, RN Phone Number: 04/30/2023, 10:33 AM  Clinical Narrative:       Expected Discharge Plan: Home w Home Health Services Barriers to Discharge: Continued Medical Work up  Expected Discharge Plan and Services       Living arrangements for the past 2 months: Single Family Home                                       Social Determinants of Health (SDOH) Interventions SDOH Screenings   Food Insecurity: No Food Insecurity (04/20/2023)  Housing: Patient Declined (04/20/2023)  Transportation Needs: No Transportation Needs (04/20/2023)  Utilities: Patient Declined (04/20/2023)  Social Connections: Unknown (04/20/2023)  Tobacco Use: Low Risk  (04/20/2023)    Readmission Risk Interventions     No data to display

## 2023-04-30 NOTE — Progress Notes (Signed)
Nurse requested Mobility Specialist to perform oxygen saturation test with pt which includes removing pt from oxygen both at rest and while ambulating.  Below are the results from that testing.     Patient Saturations on Room Air at Rest = spO2 89%  Patient Saturations on Room Air while Ambulating = sp02 83% .    Patient Saturations on 4 Liters of oxygen while Ambulating = sp02 90%  At end of testing pt left in room on 3  Liters of oxygen.  Reported results to nurse.

## 2023-04-30 NOTE — Plan of Care (Signed)

## 2023-04-30 NOTE — Progress Notes (Signed)
Physical Therapy Treatment Patient Details Name: Melanie Armstrong MRN: [id number] DOB: 04-01-1956 Today's Date: 04/30/2023   History of Present Illness Pt is 68 yo female who presents on 04/19/23 with SOB, cough, wheezing. PNA and Covid +. PMH: obesity, HTN, GERD, arthritis, asthma, B THA, L TKA, R RTC repair    PT Comments  Pt is progressing towards goals. Worked on purse lipped breathing and educated pt on when O2 monitor is getting a good reading v a poor reading; within PT scope of practice. Discussed how to pace activities in order to manage O2 sats and decrease shortness of breathe/fatigue. Pt was able to perform sit to stand at Mod I and gait at [facility] for safety. Due to pt current functional status, home set up and available assistance at home no recommended skilled physical therapy services at this time on discharge from acute care hospital setting. Will continue to follow in acute setting in order to ensure that pt returns home with decreased risk for falls, injury, re-hospitalization and improved activity tolerance.      If plan is discharge home, recommend the following: A little help with walking and/or transfers;Assist for transportation;Help with stairs or ramp for entrance     Equipment Recommendations  None recommended by PT       Precautions / Restrictions Precautions Precautions: Other (comment) Precaution Comments: watch O2 sats Restrictions Weight Bearing Restrictions Per Provider Order: No     Mobility  Bed Mobility     General bed mobility comments: pt received in chair and returned to chair at end of session    Transfers Overall transfer level: Modified independent Equipment used: None Transfers: Sit to/from Stand Sit to Stand: Contact guard assist           General transfer comment: for safety    Ambulation/Gait Ambulation/Gait assistance: Contact guard assist Gait Distance (Feet): 50 Feet Assistive device: None Gait Pattern/deviations: Step-through  pattern Gait velocity: decreased Gait velocity interpretation: <1.31 ft/sec, indicative of household ambulator   General Gait Details: O2 sats down to 84% with pt on 2L O2 via [place]      Balance Overall balance assessment: Mild deficits observed, not formally tested        Cognition Arousal: Alert Behavior During Therapy: WFL for tasks assessed/performed Overall Cognitive Status: Within Functional Limits for tasks assessed        General Comments General comments (skin integrity, edema, etc.): Pt O2 sats were able to recover quickly after short distance gait. Decreased to 84% on 2 L O2 via [place].      Pertinent Vitals/Pain Pain Assessment Pain Assessment: No/denies pain     PT Goals (current goals can now be found in the care plan section) Acute Rehab PT Goals Patient Stated Goal: breathe better, be independent PT Goal Formulation: With patient Time For Goal Achievement: 05/12/23 Potential to Achieve Goals: Good Progress towards PT goals: Progressing toward goals    Frequency    Min 1X/week      PT Plan  Continue with current POC        AM-PAC PT "6 Clicks" Mobility   Outcome Measure  Help needed turning from your back to your side while in a flat bed without using bedrails?: None Help needed moving from lying on your back to sitting on the side of a flat bed without using bedrails?: None Help needed moving to and from a bed to a chair (including a wheelchair)?: None Help needed standing up from a chair using your  arms (e.g., wheelchair or bedside chair)?: None Help needed to walk in hospital room?: A Little Help needed climbing 3-5 steps with a railing? : A Little 6 Click Score: 22    End of Session Equipment Utilized During Treatment: Oxygen;Gait belt Activity Tolerance: Patient tolerated treatment well Patient left: in chair;with call bell/phone within reach Nurse Communication: Mobility status PT Visit Diagnosis: Difficulty in walking, not elsewhere  classified (R26.2)     Time: 0981-1914 PT Time Calculation (min) (ACUTE ONLY): 10 min  Charges:    $Therapeutic Activity: 8-22 mins PT General Charges $$ ACUTE PT VISIT: 1 Visit                    Harrel Carina, DPT, CLT  Acute Rehabilitation Services Office: [phone] (Secure chat preferred)    Claudia Desanctis 04/30/2023, 3:41 PM

## 2023-04-30 NOTE — Discharge Instructions (Signed)
If you start to become more short of breath or notice that your oxygen level is persistently less than 92%, please do not hesitate to come to the hospital.   Please schedule a follow up appointment with Dr. Marchelle Gearing or one of his colleagues for next week.

## 2023-04-30 NOTE — Plan of Care (Signed)
  Problem: Respiratory: Goal: Complications related to the disease process, condition or treatment will be avoided or minimized Outcome: Progressing   Problem: Education: Goal: Knowledge of General Education information will improve Description: Including pain rating scale, medication(s)/side effects and non-pharmacologic comfort measures Outcome: Progressing   Problem: Health Behavior/Discharge Planning: Goal: Ability to manage health-related needs will improve Outcome: Progressing   Problem: Clinical Measurements: Goal: Ability to maintain clinical measurements within normal limits will improve Outcome: Progressing Goal: Will remain free from infection Outcome: Progressing   Problem: Nutrition: Goal: Adequate nutrition will be maintained Outcome: Progressing   Problem: Pain Management: Goal: General experience of comfort will improve Outcome: Progressing

## 2023-05-01 ENCOUNTER — Ambulatory Visit: Payer: Medicare Other | Admitting: Internal Medicine

## 2023-05-01 ENCOUNTER — Encounter: Payer: Self-pay | Admitting: Internal Medicine

## 2023-05-01 VITALS — BP 98/65 | HR 100 | Ht 63.0 in | Wt 201.6 lb

## 2023-05-01 DIAGNOSIS — Z8616 Personal history of COVID-19: Secondary | ICD-10-CM | POA: Diagnosis not present

## 2023-05-01 DIAGNOSIS — R5381 Other malaise: Secondary | ICD-10-CM | POA: Diagnosis not present

## 2023-05-01 DIAGNOSIS — R053 Chronic cough: Secondary | ICD-10-CM | POA: Diagnosis not present

## 2023-05-01 DIAGNOSIS — Z8709 Personal history of other diseases of the respiratory system: Secondary | ICD-10-CM

## 2023-05-01 MED ORDER — BENZONATATE 200 MG PO CAPS: 200.0000 mg | ORAL_CAPSULE | Freq: Three times a day (TID) | ORAL | 1 refills | Status: DC | PRN

## 2023-05-01 MED ORDER — PREDNISONE 10 MG PO TABS
ORAL_TABLET | ORAL | 0 refills | Status: DC
Start: 2023-05-01 — End: 2023-08-31

## 2023-05-01 NOTE — Patient Instructions (Addendum)
ICD-10-CM   1. History of 2019 novel coronavirus disease (COVID-19)  Z86.16     2. History of acute respiratory failure  Z87.09     3. Physical deconditioning  R53.81        Glad you are doing better but still needing 3L [place] though you are 88% at rest on room air for 5 mintutes  Stil some ways to go  Plan  - Extend prednisone taper as follows 60 mg daily for 1 week followed by 40 mg daily for 1 week followed by 30 mg daily for 1 week followed by 10 mg daily for 1 week followed by 5 mg daily for 1 week followed by 5 mg on Monday Wednesday Friday for 1 week and stop  -Continue oxygen but keep monitoring pulse ox and keep it greater than 88% [3 L nasal cannula current requirement]  -Check blood sugars at home intermittently because of prednisone  -Tessalon cough.  Her milligrams 3 times daily as needed for at least 1 month and 2 refills  -Referral home physical therapy  -Monitor for leg swelling and development of blood clot which you are at risk for SO keep moving  - support FMLA for daughter  Follow-up - 4 weeks video visit with nurse practitioner or face-to-face visit  - CXR/CT timing to be decided at follouwp esp with hix of nodules

## 2023-05-01 NOTE — Progress Notes (Signed)
IOV 09/08/2017  Chief Complaint  Patient presents with   Pulm Consult    Referred by Dr. Wyvonnia Dusky for left pulmonary nodules. Per patient, has 2 nodules on the left. Patient has a history of asthma and SOB.      Melanie Armstrong is a 68 year old non-smoker who works on a desk job doing Games developer.  She is here referred for left lung nodules.  The background history is that she has been on lisinopril for 15 years.  She carries a diagnosis of asthma allergic that is managed by our local allergist.  She tells me that she is been on Symbicort for at least 10 years and Spiriva for at least 5 years and the last couple of years Xolair.  She describes herself as severe persistent asthma.  Her recent spirometry May 2019 personal visualization shows no normal FEV1 and FVC.  Earlier in spring 2019 she had respiratory infection following exposure to sick contacts at work.  Since then she has had significant cough and bronchitis requiring at least 2 rounds of prednisone and antibiotics.  Things are improving and currently the cough is rated as a 3 out of 10.  She tells me as part of this persistent bronchitic cough symptoms she did have a chest x-ray that showed left lung nodule that then resulted in the CT scan of the chest Aug 20, 2017 that shows a 1.7 cm left lung nodule that is dense and I personally visualized and agree with the radiologist interpretation that this is a hamartoma.  There is another 9 mm subpleural left lower lobe posterior segment nodule and that the main reason for the referral today.  The CT scan also shows coronary artery calcification but she denies any chest pain.  She is a non-smoker.  Although when she was a kid her parents smoked.   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   Indeterminate 9 mm pulmonary nodule in posterior left lower lobe. Consider one of the following in 3  months for both low-risk and high-risk individuals: (a) repeat chest CT, (b) follow-up PET-CT, or (c) tissue sampling. This recommendation follows the consensus statement: Guidelines for Management of Incidental Pulmonary Nodules Detected on CT Images: From the Fleischner Society 2017; Radiology 2017; 284:228-243.   Incidental findings: Aortic Atherosclerosis (ICD10-I70.0). Coronary artery calcification. Tiny hiatal hernia. Small benign right adrenal adenoma.     Electronically Signed   By: Myles Rosenthal M.D.   On: 08/21/2017 10:50   OV 11/30/2017  Chief Complaint  Patient presents with   Follow-up    CT 8/16.  Pt also had cards referral 8/9. Pt states she has been doing well since last visit and denies any complaints.     Follow-up lung nodules -  9 mm left lower lobe in the setting of stable 1.7 cm benign left lower lobe hamartoma  -  history of asthma followed by allergist.  Is a routine follow-up. She had a 3 month CT scan of the chest that is documented below. The 9 mm left lower lobe nodule is stable but in May 2019 and August 2019. The left lower lobe, was also stable. In terms of asthma she follows with an allergist. She is on ACE inhibitor but she says she has no cough and she does not feel a need for medication to be discontinued.   Ct Chest Wo Contrast  Result Date: 11/27/2017  CLINICAL DATA:  Pulmonary nodule follow-up. EXAM: CT CHEST WITHOUT CONTRAST TECHNIQUE: Multidetector CT imaging of the chest was performed following the standard protocol without IV contrast. COMPARISON:  08/20/2017 FINDINGS: Cardiovascular: The heart size is normal. No substantial pericardial effusion. Coronary artery calcification is evident. Atherosclerotic calcification is noted in the wall of the thoracic aorta. Mediastinum/Nodes: No mediastinal lymphadenopathy. No evidence for gross hilar lymphadenopathy although assessment is limited by the lack of intravenous contrast on today's study. The  esophagus has normal imaging features. There is no axillary lymphadenopathy. Lungs/Pleura: The central tracheobronchial airways are patent. Scattered peripheral tiny pulmonary nodules are similar to prior. 1.7 cm nodule left lower lobe contains central fat density consistent with benign hamartoma. 9 mm peripheral left lower lobe nodule seen on the prior study persists at 9 mm today (3:109). No pleural effusion. Upper Abdomen: Stable 15 mm right adrenal adenoma. Musculoskeletal: No worrisome lytic or sclerotic osseous abnormality. IMPRESSION: 1. 9 mm left lower lobe pulmonary nodule is stable in the interval. Three-month stable imaging follow-up is reassuring. Repeat CT chest without contrast in 6-12 months recommended to ensure continued stability. 2. Stable 1.7 cm benign pulmonary hamartoma. 3. No change scattered tiny peripheral nodules in both lungs. 4.  Aortic Atherosclerois (ICD10-170.0) Electronically Signed   By: Kennith Center M.D.   On: 11/27/2017 16:11   OV 12/13/2018  Subjective:  Patient ID: Melanie Armstrong, female , DOB: 06-18-1955 , age 56 y.o. , MRN: [id number] , ADDRESS: [address] [place] [address]   12/13/2018 -   Chief Complaint  Patient presents with   Nodule of lower lobe of left lung    Follow-up lung nodules -  9 mm left lower lobe in the setting of separate stable 1.7 cm benign left lower lobe hamartoma  - Hx of  history of asthma followed by allergist. - nasal inhalers, symbicort, spiriva, xolair - Also on ace inhibitors without cough   HPI CHANTEA LANGBEHN 68 y.o. -is now over 1 year since  last saw Ms. Katayama.  Overall she is stable and doing well.  No major issues in the interim.  She is due for a flu shot and is willing to have it today.  She had a CT scan of the chest October 19, 2018 as a follow-up to August 29 CT scan of the chest.  The left lower lobe 0.8/0.9 cm pulmonary nodule is stable.  Her hematoma is also stable.  She does have coronary artery atherosclerosis.   She says she has seen Dr. Eldridge Dace and has been reassured.  She has allergic asthma and sees an outside allergist.  She is on nasal inhalers, Symbicort, Spiriva and Xolair.  She is also on ACE inhibitor lisinopril.  She states this does not cause cough or make the asthma worse    IMPRESSION: CT chest 10/19/2018 1. Irregular posterior left lower lobe 0.8 cm pulmonary nodule is stable since 08/20/2017 chest CT, more likely benign. Suggest continued follow-up chest CT in 12 months given the irregular morphology of this nodule. 2. Separate stable benign left lower lobe pulmonary hamartoma. 3. Three-vessel coronary atherosclerosis. 4. Small hiatal hernia.   Aortic Atherosclerosis (ICD10-I70.0).     Electronically Signed   By: Delbert Phenix M.D.   On: 10/19/2018 14:09 ROS - per HPI    OV 12/26/2019  Subjective:  Patient ID: Melanie Armstrong, female , DOB: [birth year] , age 23 y.o. , MRN: [id number] , ADDRESS: [address] [place] [address]  12/26/2019 -   Chief Complaint  Patient presents with   Follow-up    go over ct results   Follow-up lung nodules -  9 mm left lower lobe in the setting of separate stable 1.7 cm benign left lower lobe hamartoma  - Hx of  history of asthma followed by allergist. - nasal inhalers, symbicort, spiriva, xolair - Also on ace inhibitors without cough   HPI STACIA BRICKETT 68 y.o. -presents for follow-up of the above issues.  Her asthma is under well control.  She is on Spiriva Symbicort and Xolair.  She does not want to reduce this.  She uses albuterol for rescue once a week.  Her allergist manages this.  She had a CT scan of the chest in August 2021 for her left lower lobe hamartoma in the chest and also left lower lobe nodule.  These are stable.  There are some spiculation reported around her nodule in the left lower lobe therefore the radiologist recommended doing another CT scan in 1 year.  This is despite the fact the nodule has been stable for 2  years.  I discussed with her the risks of getting another CT scan such as radiation but she is willing to have another CT scan done in 1 year.  There are no other active complaints.  She has had a flu shot and also the Covid vaccine.   IMPRESSION: 1. 7 x 7 mm LEFT lower lobe pulmonary nodule with irregular margins measures 7 x 7 mm on the study of 08/20/2017. Stability over 2 years time argues for benign etiology. Given irregular margins could consider continued surveillance at a 12 month interval. 2. Scattered small pulmonary nodules throughout the upper lobes also without interval change, potentially related to prior infection or inflammation. 3. Presumed pulmonary hamartoma is unchanged in the LEFT chest measuring approximately 1.9 x 1.9 cm. 4. Three-vessel coronary artery disease. 5. Small hiatal hernia. 6. Aortic atherosclerosis.   Aortic Atherosclerosis (ICD10-I70.0).     Electronically Signed   By: Donzetta Kohut M.D.   On: 11/28/2019 13:47      OV 01/04/2021  Subjective:  Patient ID: Melanie Armstrong, female , DOB: 12/12/1955 , age 51 y.o. , MRN: [id number] , ADDRESS: [address] [place] [address] PCP Johny Blamer, MD Patient Care Team: Johny Blamer, MD as PCP - General (Family Medicine)  This Provider for this visit: Treatment Team:  Attending Provider: Kalman Shan, MD    01/04/2021 -   Chief Complaint  Patient presents with   Follow-up    Pt is here today to discuss results of recent CT. States she has been doing okay since last visit and denies any complaints.     HPI NATSUE GRONOWSKI 68 y.o. -returns for follow-up.  Her asthma and allergies being maintained by Dr. Tiburcio Pea and also her allergist Dr. Selena Batten.  She tells me that currently her asthma is active and she is going to finish day 14 of clarithromycin shortly.  She is also going to do 5 days of prednisone starting today from her primary care physician.  In the context of this she had a CT  scan of the chest without contrast.  She has new micronodules in the right upper lobe anterior region right behind the right breast on the image.  These look very infectious.  Her old left lower lobe nodule and her left lower lobe hematoma are stable in my personal visualization.  I showed her these images.  She had a question about switching her Symbicort and Spiriva to Trelegy.  Apparently her allergist has recommended this.  I have supported this plan.   Of note her daughter is Bich Franca who is a Engineer, civil (consulting) in [address] [employer]  Narrative & Impression  CLINICAL DATA:  Polyp lung nodule   EXAM: CT CHEST WITHOUT CONTRAST   TECHNIQUE: Multidetector CT imaging of the chest was performed following the standard protocol without IV contrast.   COMPARISON:  CT chest dated July 29, 2019   FINDINGS: Cardiovascular: Normal heart size. No pericardial effusion. Three-vessel coronary artery calcifications. Atherosclerotic disease of the thoracic aorta.   Mediastinum/Nodes: Small hiatal hernia. Thyroid is unremarkable. No pathologically enlarged lymph nodes in the chest.   Lungs/Pleura: Central airways are patent. No consolidation, pleural effusion or pneumothorax. New clustered small nodules of the right upper lobe. Fat containing solid pulmonary nodule of the left lower lobe located on series 5, image 65 is unchanged in size compared to prior exam measuring 1.9 x 1.9 cm additional smaller juxtapleural nodule in the left lower lobe is unchanged in size compared to prior measuring 0.7 x 0.7 cm. On image 88.   Upper Abdomen: Cholecystectomy clips stable right adrenal nodule measuring 1.2 cm, likely benign adenoma. Probable small mL the left kidney measuring 0.6 cm. No acute findings.   Musculoskeletal: No chest wall mass or suspicious bone lesions identified.   IMPRESSION: Stable solid left lower lobe pulmonary nodule measuring 0.7 cm.   New cluster of small nodules seen in the right  upper lobe, likely infectious or inflammatory.   Stable left lower lobe pulmonary hamartoma.   Aortic Atherosclerosis (ICD10-I70.0).     Electronically Signed   By: Allegra Lai M.D.   On: 12/21/2020 08:41      OV 03/28/2022  Subjective:  Patient ID: Melanie Armstrong, female , DOB: 07-11-1955 , age 17 y.o. , MRN: [id number] , ADDRESS: [address] [place] [address] PCP Johny Blamer, MD Patient Care Team: Johny Blamer, MD as PCP - General (Family Medicine)  This Provider for this visit: Treatment Team:  Attending Provider: Kalman Shan, MD    03/28/2022 -   Chief Complaint  Patient presents with   Follow-up    Pt is here following up after recent CT.  Pt states she has been doing okay since last visit and denies any complaints.     HPI LINDYN TOPALIAN 68 y.o. -returns for follow-up after 1 year.  She is doing well.  She has been on antibiotics 3-4 times this year because of sinus issues but no prednisone.  She has had cataract surgery knee surgery and glaucoma surgery this year but otherwise well.  Around 02/25/2022 she had a sinus infection and took antibiotics clarithromycin with primary care physician.  Asthma is well-controlled on Spiriva Singulair and also Spiriva.  She says overall she is doing well.  She has not had RSV vaccine.  I did counsel her about this and she will have it at [facility].  We discussed the ACE inhibitor intake.  She says she cannot tolerate losartan and other ARB's.  Therefore she is opted to be on lisinopril.  It is not causing any cough.  She feels it is not causing any asthma flares.    CT Chest data 12/18/21  Narrative & Impression  CLINICAL DATA:  Follow-up right upper lobe pulmonary nodule, low cancer risk. History of asthma.   EXAM: CT CHEST WITHOUT CONTRAST   TECHNIQUE:  Multidetector CT imaging of the chest was performed following the standard protocol without IV contrast.   RADIATION DOSE REDUCTION: This exam was  performed according to the departmental dose-optimization program which includes automated exposure control, adjustment of the mA and/or kV according to patient size and/or use of iterative reconstruction technique.   COMPARISON:  Multiple priors including most recent chest CT December 20, 2020.   FINDINGS: Cardiovascular: Aortic atherosclerosis. Three-vessel coronary artery calcifications. Normal size heart. No significant pericardial effusion/thickening.   Mediastinum/Nodes: No suspicious thyroid nodule. No pathologically enlarged mediastinal, hilar or axillary lymph nodes. Small hiatal hernia.   Lungs/Pleura: Stable size of the macroscopic fat containing solid pulmonary nodules in the left lower lobe measuring 18 x 18 mm on image 71/5 previously 19 x 19 mm. Additional smaller juxta pleural nodule in the left lower lobe is also stable in size measuring 7 x 6 mm on image 102/5 previously 7 x 7 mm. Tiny clustered nodules in the right upper lobe for instance on images 30-34/5 are similar prior and likely infectious or inflammatory. New clustered nodularity in the left upper lobe for instance on image 29/5 and 37/5. No pleural effusion. No pneumothorax.   Upper Abdomen: Gallbladder surgically absent.   Musculoskeletal: Multilevel degenerative changes spine.   IMPRESSION: 1. Stable solid 7 mm left lower lobe pulmonary nodule. 2. Stable clustered nodules in the right upper lobe with new clustered nodules in the left upper lobe, likely infectious or inflammatory. 3. Stable left lower lobe pulmonary hamartoma. 4.  Aortic Atherosclerosis (ICD10-I70.0).     Electronically Signed   By: Maudry Mayhew M.D.   On: 12/18/2021 15:07      OV 05/01/2023  Subjective:  Patient ID: Melanie Armstrong, female , DOB: 16-Nov-1955 , age 3 y.o. , MRN: [id number] , ADDRESS: [address] [place] [address] PCP Noberto Retort, MD Patient Care Team: Noberto Retort, MD as PCP -  General (Family Medicine)  This Provider for this visit: Treatment Team:  Attending Provider: Kalman Shan, MD  Follow-up lung nodules -  9 mm left lower lobe in the setting of separate stable 1.7 cm benign left lower lobe hamartoma  - Hx of  history of asthma followed by allergist. - nasal inhalers, symbicort, spiriva, xolair - Also on ace inhibitors without cough -New right upper lobe micronodules in the setting of infectious symptoms September 2022 -> stble sept 20-23 -New clustered left upper lobe nodularity September 2023  05/01/2023 -   Chief Complaint  Patient presents with   Follow-up    Hospital f/u 04/30/23, pt has covid pneumonia, concerned about getting better. Using 3L continuous flow, pt is concerned about o2 machine.      HPI AVERIANA WUENSCH 69 y.o. -this is a posthospital follow-up for COVID-pneumonia and respiratory failure.  She was on high flow oxygen.  She was also on BiPAP at night.  Pulmonary embolism was ruled out.  Most recent set of labs was September 28, 2023 which was yesterday with a normal creatinine.,  Normal hemoglobin and white count.  Last chest x-ray was 04/19/2023 with left lower lobe infiltrates.  She is currently here for posthospital follow-up.  She got discharged yesterday.  Daughter is here with her daughter independent historian.  Sick significantly better at 1 point she was on 50% oxygen.  Currently discharge at 3 L nasal cannula.  Here on room air at rest she is 89-90% for 5 minutes.  Even at the bathroom yesterday she was 89%  on room air.  She says she is physically tired and weak.  She has prednisone taper for just 1 week.  I did indicate to her that we might have to extend this.  She is open to this idea.  This based on anecdotal clinical experience.  She does not have diabetes but daughter indicated she can monitor blood sugars at home.  They wanted Tessalon cough Perles.    PFT      No data to display            LAB RESULTS last 96  hours No results found.  LAB RESULTS last 90 days Recent Results (from the past 2160 hours)  Blood culture (routine x 2)     Status: None   Collection Time: 04/19/23  9:02 AM   Specimen: BLOOD LEFT HAND  Result Value Ref Range   Specimen Description BLOOD LEFT HAND    Special Requests      BOTTLES DRAWN AEROBIC AND ANAEROBIC Blood Culture results may not be optimal due to an inadequate volume of blood received in culture bottles   Culture      NO GROWTH 5 DAYS Performed at [facility], [address], [place], [place] [address]    Report Status 04/24/2023 FINAL   Resp panel by RT-PCR (RSV, Flu A&B, Covid) Anterior Nasal Swab     Status: Abnormal   Collection Time: 04/19/23  9:10 AM   Specimen: Anterior Nasal Swab  Result Value Ref Range   SARS Coronavirus 2 by RT PCR POSITIVE (A) NEGATIVE   Influenza A by PCR NEGATIVE NEGATIVE   Influenza B by PCR NEGATIVE NEGATIVE    Comment: (NOTE) The Xpert Xpress SARS-CoV-2/FLU/RSV plus assay is intended as an aid in the diagnosis of influenza from Nasopharyngeal swab specimens and should not be used as a sole basis for treatment. Nasal washings and aspirates are unacceptable for Xpert Xpress SARS-CoV-2/FLU/RSV testing.  Fact Sheet for Patients: BloggerCourse.com  Fact Sheet for Healthcare Providers: SeriousBroker.it  This test is not yet approved or cleared by the [place] FDA and has been authorized for detection and/or diagnosis of SARS-CoV-2 by FDA under an Emergency Use Authorization (EUA). This EUA will remain in effect (meaning this test can be used) for the duration of the COVID-19 declaration under Section 564(b)(1) of the Act, 21 U.S.C. section 360bbb-3(b)(1), unless the authorization is terminated or revoked.     Resp Syncytial Virus by PCR NEGATIVE NEGATIVE    Comment: (NOTE) Fact Sheet for Patients: BloggerCourse.com  Fact Sheet for  Healthcare Providers: SeriousBroker.it  This test is not yet approved or cleared by the [place] FDA and has been authorized for detection and/or diagnosis of SARS-CoV-2 by FDA under an Emergency Use Authorization (EUA). This EUA will remain in effect (meaning this test can be used) for the duration of the COVID-19 declaration under Section 564(b)(1) of the Act, 21 U.S.C. section 360bbb-3(b)(1), unless the authorization is terminated or revoked.  Performed at [facility], [address] [place], [place] [address]   Blood culture (routine x 2)     Status: None   Collection Time: 04/19/23  9:10 AM   Specimen: BLOOD RIGHT WRIST  Result Value Ref Range   Specimen Description BLOOD RIGHT WRIST    Special Requests      BOTTLES DRAWN AEROBIC AND ANAEROBIC Blood Culture adequate volume   Culture      NO GROWTH 5 DAYS Performed at [facility], [address] [place], [place] [address]    Report Status 04/24/2023 FINAL   Comprehensive metabolic panel     Status: Abnormal   Collection Time: 04/19/23  9:12 AM  Result Value Ref Range   Sodium 138 135 - 145 mmol/L   Potassium 2.9 (L) 3.5 - 5.1 mmol/L   Chloride 100 98 - 111 mmol/L   CO2 26 22 - 32 mmol/L   Glucose, Bld 121 (H) 70 - 99 mg/dL    Comment: Glucose reference range applies only to samples taken after fasting for at least 8 hours.   BUN 21 8 - 23 mg/dL   Creatinine, Ser 6.57 0.44 - 1.00 mg/dL   Calcium 9.0 8.9 - 84.6 mg/dL   Total Protein 5.7 (L) 6.5 - 8.1 g/dL   Albumin 2.7 (L) 3.5 - 5.0 g/dL   AST 41 15 - 41 U/L   ALT 36 0 - 44 U/L   Alkaline Phosphatase 63 38 - 126 U/L   Total Bilirubin 0.9 0.0 - 1.2 mg/dL   GFR, Estimated >96 >29 mL/min    Comment: (NOTE) Calculated using the CKD-EPI Creatinine Equation (2021)    Anion gap 12 5 - 15    Comment: Performed at [facility], [address] [place], [place] [address]  CBC     Status: None   Collection Time: 04/19/23   9:12 AM  Result Value Ref Range   WBC 7.5 4.0 - 10.5 K/uL   RBC 4.48 3.87 - 5.11 MIL/uL   Hemoglobin 14.1 12.0 - 15.0 g/dL   HCT 32.4 40.1 - 02.7 %   MCV 94.2 80.0 - 100.0 fL   MCH 31.5 26.0 - 34.0 pg   MCHC 33.4 30.0 - 36.0 g/dL   RDW 25.3 66.4 - 40.3 %   Platelets 232 150 - 400 K/uL   nRBC 0.0 0.0 - 0.2 %    Comment: Performed at [facility], [address] [place], [place] [address]  Brain natriuretic peptide     Status: None   Collection Time: 04/19/23  9:12 AM  Result Value Ref Range   B Natriuretic Peptide 41.9 0.0 - 100.0 pg/mL    Comment: Performed at [facility], [address] [place], [place] [address]  I-Stat CG4 Lactic Acid     Status: None   Collection Time: 04/19/23  9:23 AM  Result Value Ref Range   Lactic Acid, Venous 1.4 0.5 - 1.9 mmol/L  I-Stat arterial blood gas, ED ([facility], [facility], [facility])     Status: Abnormal   Collection Time: 04/19/23 11:17 AM  Result Value Ref Range   pH, Arterial 7.470 (H) 7.35 - 7.45   pCO2 arterial 38.1 32 - 48 mmHg   pO2, Arterial 64 (L) 83 - 108 mmHg   Bicarbonate 27.7 20.0 - 28.0 mmol/L   TCO2 29 22 - 32 mmol/L   O2 Saturation 93 %   Acid-Base Excess 4.0 (H) 0.0 - 2.0 mmol/L   Sodium 138 135 - 145 mmol/L   Potassium 2.8 (L) 3.5 - 5.1 mmol/L   Calcium, Ion 1.22 1.15 - 1.40 mmol/L   HCT 35.0 (L) 36.0 - 46.0 %   Hemoglobin 11.9 (L) 12.0 - 15.0 g/dL   Sample type ARTERIAL   HIV Antibody (routine testing w rflx)     Status: None   Collection Time: 04/19/23 11:25 AM  Result Value Ref Range   HIV Screen 4th Generation wRfx Non Reactive Non Reactive  Comment: Performed at [facility], [address], [place], [place] [address]  Procalcitonin     Status: None   Collection Time: 04/19/23 11:25 AM  Result Value Ref Range   Procalcitonin <0.10 ng/mL    Comment:        Interpretation: PCT (Procalcitonin) <= 0.5 ng/mL: Systemic infection (sepsis) is not likely. Local bacterial infection is possible. (NOTE)        Sepsis PCT Algorithm           Lower Respiratory Tract                                      Infection PCT Algorithm    ----------------------------     ----------------------------         PCT < 0.25 ng/mL                PCT < 0.10 ng/mL          Strongly encourage             Strongly discourage   discontinuation of antibiotics    initiation of antibiotics    ----------------------------     -----------------------------       PCT 0.25 - 0.50 ng/mL            PCT 0.10 - 0.25 ng/mL               OR       >80% decrease in PCT            Discourage initiation of                                            antibiotics      Encourage discontinuation           of antibiotics    ----------------------------     -----------------------------         PCT >= 0.50 ng/mL              PCT 0.26 - 0.50 ng/mL               AND        <80% decrease in PCT             Encourage initiation of                                             antibiotics       Encourage continuation           of antibiotics    ----------------------------     -----------------------------        PCT >= 0.50 ng/mL                  PCT > 0.50 ng/mL               AND         increase in PCT                  Strongly encourage  initiation of antibiotics    Strongly encourage escalation           of antibiotics                                     -----------------------------                                           PCT <= 0.25 ng/mL                                                 OR                                        > 80% decrease in PCT                                      Discontinue / Do not initiate                                             antibiotics  Performed at [facility], [address] [place], [place] [address]   Magnesium     Status: None   Collection Time: 04/19/23 11:25 AM  Result Value Ref Range   Magnesium 2.2 1.7 - 2.4 mg/dL    Comment: Performed at  [facility], [address] [place], [place] [address]  Phosphorus     Status: None   Collection Time: 04/19/23 11:25 AM  Result Value Ref Range   Phosphorus 2.5 2.5 - 4.6 mg/dL    Comment: Performed at [facility], [address] [place], [place] [address]  D-dimer, quantitative     Status: Abnormal   Collection Time: 04/19/23 11:25 AM  Result Value Ref Range   D-Dimer, Quant 1.19 (H) 0.00 - 0.50 ug/mL-FEU    Comment: (NOTE) At the manufacturer cut-off value of 0.5 g/mL FEU, this assay has a negative predictive value of 95-100%.This assay is intended for use in conjunction with a clinical pretest probability (PTP) assessment model to exclude pulmonary embolism (PE) and deep venous thrombosis (DVT) in outpatients suspected of PE or DVT. Results should be correlated with clinical presentation. Performed at [facility], [address] [place], [place] [address]   MRSA Next Gen by PCR, Nasal     Status: None   Collection Time: 04/19/23 11:34 AM   Specimen: Nasal Mucosa; Nasal Swab  Result Value Ref Range   MRSA by PCR Next Gen NOT DETECTED NOT DETECTED    Comment: (NOTE) The GeneXpert MRSA Assay (FDA approved for NASAL specimens only), is one component of a comprehensive MRSA colonization surveillance program. It is not intended to diagnose MRSA infection nor to guide or monitor treatment for MRSA infections. Test performance is not FDA approved in patients less than 59 years old. Performed at [facility]  Lab, [address], [place], [place] [address]   CBC     Status: Abnormal   Collection Time: 04/20/23  2:08 AM  Result Value Ref Range   WBC 2.6 (L) 4.0 - 10.5 K/uL   RBC 4.29 3.87 - 5.11 MIL/uL   Hemoglobin 13.5 12.0 - 15.0 g/dL   HCT 91.4 78.2 - 95.6 %   MCV 93.9 80.0 - 100.0 fL   MCH 31.5 26.0 - 34.0 pg   MCHC 33.5 30.0 - 36.0 g/dL   RDW 21.3 08.6 - 57.8 %   Platelets 215 150 - 400 K/uL   nRBC 0.0 0.0 - 0.2 %    Comment: Performed at [facility], [address], [place], [place] [address]  Basic metabolic panel     Status: Abnormal   Collection Time: 04/20/23  2:08 AM  Result Value Ref Range   Sodium 138 135 - 145 mmol/L   Potassium 3.8 3.5 - 5.1 mmol/L   Chloride 106 98 - 111 mmol/L   CO2 24 22 - 32 mmol/L   Glucose, Bld 249 (H) 70 - 99 mg/dL    Comment: Glucose reference range applies only to samples taken after fasting for at least 8 hours.   BUN 16 8 - 23 mg/dL   Creatinine, Ser 9.52 0.44 - 1.00 mg/dL   Calcium 8.6 (L) 8.9 - 10.3 mg/dL   GFR, Estimated >84 >13 mL/min    Comment: (NOTE) Calculated using the CKD-EPI Creatinine Equation (2021)    Anion gap 8 5 - 15    Comment: Performed at [facility], [address] [place], [place] [address]  Strep pneumoniae urinary antigen     Status: None   Collection Time: 04/20/23  2:35 AM  Result Value Ref Range   Strep Pneumo Urinary Antigen NEGATIVE NEGATIVE    Comment:        Infection due to S. pneumoniae cannot be absolutely ruled out since the antigen present may be below the detection limit of the test. Performed at [facility], [address], [place], [place] [address]   C-reactive protein     Status: Abnormal   Collection Time: 04/20/23  8:59 AM  Result Value Ref Range   CRP 13.2 (H) <1.0 mg/dL    Comment: Performed at [facility], [address] [place], [place] [address]  C-reactive protein     Status: Abnormal   Collection Time: 04/21/23  2:28 AM  Result Value Ref Range   CRP 7.4 (H) <1.0 mg/dL    Comment: Performed at [facility], [address] [place], [place] [address]  Comprehensive metabolic panel     Status: Abnormal   Collection Time: 04/21/23  2:28 AM  Result Value Ref Range   Sodium 140 135 - 145 mmol/L   Potassium 3.4 (L) 3.5 - 5.1 mmol/L   Chloride 103 98 - 111 mmol/L   CO2 25 22 - 32 mmol/L   Glucose, Bld 180 (H) 70 - 99 mg/dL    Comment: Glucose reference range applies only to samples taken after  fasting for at least 8 hours.   BUN 20 8 - 23 mg/dL   Creatinine, Ser 2.59 0.44 - 1.00 mg/dL   Calcium 9.2 8.9 - 56.3 mg/dL   Total Protein 5.5 (L) 6.5 - 8.1 g/dL   Albumin 2.2 (L) 3.5 - 5.0 g/dL   AST 28 15 - 41 U/L   ALT 46 (H) 0 -  44 U/L   Alkaline Phosphatase 61 38 - 126 U/L   Total Bilirubin 0.7 0.0 - 1.2 mg/dL   GFR, Estimated >16 >10 mL/min    Comment: (NOTE) Calculated using the CKD-EPI Creatinine Equation (2021)    Anion gap 12 5 - 15    Comment: Performed at [facility], [address], [place], [place] [address]  CBC     Status: Abnormal   Collection Time: 04/21/23  2:28 AM  Result Value Ref Range   WBC 11.1 (H) 4.0 - 10.5 K/uL   RBC 4.32 3.87 - 5.11 MIL/uL   Hemoglobin 13.6 12.0 - 15.0 g/dL   HCT 40.9 81.1 - 91.4 %   MCV 93.1 80.0 - 100.0 fL   MCH 31.5 26.0 - 34.0 pg   MCHC 33.8 30.0 - 36.0 g/dL   RDW 78.2 95.6 - 21.3 %   Platelets 295 150 - 400 K/uL   nRBC 0.0 0.0 - 0.2 %    Comment: Performed at [facility], [address], [place], [place] [address]  C-reactive protein     Status: Abnormal   Collection Time: 04/22/23  2:35 AM  Result Value Ref Range   CRP 4.2 (H) <1.0 mg/dL    Comment: Performed at [facility], [address] [place], [place] [address]  Comprehensive metabolic panel     Status: Abnormal   Collection Time: 04/22/23  2:35 AM  Result Value Ref Range   Sodium 136 135 - 145 mmol/L   Potassium 4.4 3.5 - 5.1 mmol/L   Chloride 100 98 - 111 mmol/L   CO2 26 22 - 32 mmol/L   Glucose, Bld 181 (H) 70 - 99 mg/dL    Comment: Glucose reference range applies only to samples taken after fasting for at least 8 hours.   BUN 24 (H) 8 - 23 mg/dL   Creatinine, Ser 2.95 0.44 - 1.00 mg/dL   Calcium 9.1 8.9 - 28.4 mg/dL   Total Protein 5.6 (L) 6.5 - 8.1 g/dL   Albumin 2.3 (L) 3.5 - 5.0 g/dL   AST 24 15 - 41 U/L   ALT 44 0 - 44 U/L   Alkaline Phosphatase 60 38 - 126 U/L   Total Bilirubin 0.7 0.0 - 1.2 mg/dL   GFR, Estimated >13 >24  mL/min    Comment: (NOTE) Calculated using the CKD-EPI Creatinine Equation (2021)    Anion gap 10 5 - 15    Comment: Performed at [facility], [address], [place], [place] [address]  CBC     Status: Abnormal   Collection Time: 04/22/23  2:35 AM  Result Value Ref Range   WBC 13.0 (H) 4.0 - 10.5 K/uL   RBC 4.46 3.87 - 5.11 MIL/uL   Hemoglobin 13.9 12.0 - 15.0 g/dL   HCT 72.5 36.6 - 44.0 %   MCV 92.8 80.0 - 100.0 fL   MCH 31.2 26.0 - 34.0 pg   MCHC 33.6 30.0 - 36.0 g/dL   RDW 34.7 42.5 - 95.6 %   Platelets 303 150 - 400 K/uL   nRBC 0.0 0.0 - 0.2 %    Comment: Performed at [facility], [address], [place], [place] [address]  C-reactive protein     Status: Abnormal   Collection Time: 04/23/23  2:30 AM  Result Value Ref Range   CRP 2.5 (H) <1.0 mg/dL    Comment: Performed at [facility], [address], [place], [place] [address]  Comprehensive metabolic panel     Status: Abnormal   Collection Time: 04/23/23  2:30 AM  Result Value Ref Range   Sodium 136 135 - 145 mmol/L   Potassium 4.2 3.5 - 5.1 mmol/L   Chloride 101 98 - 111 mmol/L   CO2 25 22 - 32 mmol/L   Glucose, Bld 192 (H) 70 - 99 mg/dL    Comment: Glucose reference range applies only to samples taken after fasting for at least 8 hours.   BUN 22 8 - 23 mg/dL   Creatinine, Ser 6.04 0.44 - 1.00 mg/dL   Calcium 8.6 (L) 8.9 - 10.3 mg/dL   Total Protein 5.3 (L) 6.5 - 8.1 g/dL   Albumin 2.3 (L) 3.5 - 5.0 g/dL   AST 23 15 - 41 U/L   ALT 40 0 - 44 U/L   Alkaline Phosphatase 62 38 - 126 U/L   Total Bilirubin 0.6 0.0 - 1.2 mg/dL   GFR, Estimated >54 >09 mL/min    Comment: (NOTE) Calculated using the CKD-EPI Creatinine Equation (2021)    Anion gap 10 5 - 15    Comment: Performed at [facility], [address] [place], [place] [address]  CBC     Status: Abnormal   Collection Time: 04/23/23  2:30 AM  Result Value Ref Range   WBC 16.9 (H) 4.0 - 10.5 K/uL   RBC 4.39 3.87 - 5.11 MIL/uL    Hemoglobin 13.7 12.0 - 15.0 g/dL   HCT 47.8 29.5 - 62.1 %   MCV 93.4 80.0 - 100.0 fL   MCH 31.2 26.0 - 34.0 pg   MCHC 33.4 30.0 - 36.0 g/dL   RDW 30.8 65.7 - 84.6 %   Platelets 328 150 - 400 K/uL   nRBC 0.0 0.0 - 0.2 %    Comment: Performed at [facility], [address] [place], [place] [address]  C-reactive protein     Status: Abnormal   Collection Time: 04/24/23  2:17 AM  Result Value Ref Range   CRP 1.4 (H) <1.0 mg/dL    Comment: Performed at [facility], [address], [place], [place] [address]  Comprehensive metabolic panel     Status: Abnormal   Collection Time: 04/24/23  2:17 AM  Result Value Ref Range   Sodium 139 135 - 145 mmol/L   Potassium 4.2 3.5 - 5.1 mmol/L   Chloride 99 98 - 111 mmol/L   CO2 29 22 - 32 mmol/L   Glucose, Bld 137 (H) 70 - 99 mg/dL    Comment: Glucose reference range applies only to samples taken after fasting for at least 8 hours.   BUN 20 8 - 23 mg/dL   Creatinine, Ser 2.44 0.44 - 1.00 mg/dL   Calcium 8.7 (L) 8.9 - 10.3 mg/dL   Total Protein 5.1 (L) 6.5 - 8.1 g/dL   Albumin 2.2 (L) 3.5 - 5.0 g/dL   AST 23 15 - 41 U/L   ALT 36 0 - 44 U/L   Alkaline Phosphatase 63 38 - 126 U/L   Total Bilirubin 0.6 0.0 - 1.2 mg/dL   GFR, Estimated >01 >02 mL/min    Comment: (NOTE) Calculated using the CKD-EPI Creatinine Equation (2021)    Anion gap 11 5 - 15    Comment: Performed at [facility], [address], [place], [place] [address]  CBC     Status: Abnormal   Collection Time: 04/24/23  2:17 AM  Result Value Ref Range   WBC 15.6 (H) 4.0 - 10.5 K/uL   RBC 4.44 3.87 - 5.11 MIL/uL   Hemoglobin 13.9 12.0 - 15.0 g/dL   HCT 47.8 29.5 - 62.1 %   MCV 94.8 80.0 - 100.0 fL   MCH 31.3 26.0 - 34.0 pg   MCHC 33.0 30.0 - 36.0 g/dL   RDW 30.8 65.7 - 84.6 %   Platelets 330 150 - 400 K/uL   nRBC 0.0 0.0 - 0.2 %    Comment: Performed at [facility], [address], [place], [place] [address]  Comprehensive metabolic panel     Status:  Abnormal   Collection Time: 04/25/23  2:10 AM  Result Value Ref Range   Sodium 136 135 - 145 mmol/L   Potassium 4.2 3.5 - 5.1 mmol/L   Chloride 99 98 - 111 mmol/L   CO2 31 22 - 32 mmol/L   Glucose, Bld 121 (H) 70 - 99 mg/dL    Comment: Glucose reference range applies only to samples taken after fasting for at least 8 hours.   BUN 22 8 - 23 mg/dL   Creatinine, Ser 2.84 0.44 - 1.00 mg/dL   Calcium 8.7 (L) 8.9 - 10.3 mg/dL   Total Protein 5.2 (L) 6.5 - 8.1 g/dL   Albumin 2.4 (L) 3.5 - 5.0 g/dL   AST 22 15 - 41 U/L   ALT 34 0 - 44 U/L   Alkaline Phosphatase 61 38 - 126 U/L   Total Bilirubin 0.7 0.0 - 1.2 mg/dL   GFR, Estimated >13 >24 mL/min    Comment: (NOTE) Calculated using the CKD-EPI Creatinine Equation (2021)    Anion gap 6 5 - 15    Comment: Performed at [facility], [address], [place], [place] [address]  CBC     Status: Abnormal   Collection Time: 04/25/23  2:10 AM  Result Value Ref Range   WBC 14.6 (H) 4.0 - 10.5 K/uL   RBC 4.43 3.87 - 5.11 MIL/uL   Hemoglobin 13.8 12.0 - 15.0 g/dL   HCT 72.5 36.6 - 44.0 %   MCV 94.8 80.0 - 100.0 fL   MCH 31.2 26.0 - 34.0 pg   MCHC 32.9 30.0 - 36.0 g/dL   RDW 34.7 42.5 - 95.6 %   Platelets 299 150 - 400 K/uL   nRBC 0.0 0.0 - 0.2 %    Comment: Performed at [facility], [address], [place], [place] [address]  Basic metabolic panel     Status: Abnormal   Collection Time: 04/29/23  2:32 AM  Result Value Ref Range   Sodium 138 135 - 145 mmol/L   Potassium 4.3 3.5 - 5.1 mmol/L    Comment: HEMOLYSIS AT THIS LEVEL MAY AFFECT RESULT   Chloride 100 98 - 111 mmol/L   CO2 29 22 - 32 mmol/L   Glucose, Bld 116 (H) 70 - 99 mg/dL    Comment: Glucose reference range applies only to samples taken after fasting for at least 8 hours.   BUN 19 8 - 23 mg/dL   Creatinine, Ser 4.33 0.44 - 1.00 mg/dL   Calcium 8.8 (L) 8.9 - 10.3 mg/dL   GFR, Estimated >29 >51 mL/min    Comment: (NOTE) Calculated using the CKD-EPI Creatinine  Equation (2021)    Anion gap 9 5 - 15    Comment: Performed at [facility], [address] [place], [place] [address]  CBC with Differential/Platelet     Status: Abnormal   Collection Time: 04/29/23  2:32 AM  Result Value Ref Range   WBC 9.8 4.0 - 10.5 K/uL   RBC 4.27 3.87 - 5.11 MIL/uL   Hemoglobin 13.4 12.0 - 15.0 g/dL   HCT 16.1 09.6 - 04.5 %   MCV 95.6 80.0 - 100.0 fL   MCH 31.4 26.0 - 34.0 pg   MCHC 32.8 30.0 - 36.0 g/dL   RDW 40.9 81.1 - 91.4 %   Platelets 223 150 - 400 K/uL   nRBC 0.0 0.0 - 0.2 %   Neutrophils Relative % 77 %   Neutro Abs 7.6 1.7 - 7.7 K/uL   Lymphocytes Relative 13 %   Lymphs Abs 1.3 0.7 - 4.0 K/uL   Monocytes Relative 9 %   Monocytes Absolute 0.8 0.1 - 1.0 K/uL   Eosinophils Relative 0 %   Eosinophils Absolute 0.0 0.0 - 0.5 K/uL   Basophils Relative 0 %   Basophils Absolute 0.0 0.0 - 0.1 K/uL   Immature Granulocytes 1 %   Abs Immature Granulocytes 0.11 (H) 0.00 - 0.07 K/uL    Comment: Performed at [facility], [address], [place], [place] [address]  Blood gas, arterial     Status: Abnormal   Collection Time: 04/29/23  6:29 PM  Result Value Ref Range   pH, Arterial 7.44 7.35 - 7.45   pCO2 arterial 44 32 - 48 mmHg   pO2, Arterial 76 (L) 83 - 108 mmHg   Bicarbonate 29.9 (H) 20.0 - 28.0 mmol/L   Acid-Base Excess 5.0 (H) 0.0 - 2.0 mmol/L   O2 Saturation 97.2 %   Patient temperature 36.9    Collection site LEFT RADIAL    Drawn by ROB,RT    Allens test (pass/fail) PASS PASS    Comment: Performed at [facility], [address] [place], [place] [address]  CBC     Status: None   Collection Time: 04/30/23  2:20 AM  Result Value Ref Range   WBC 8.0 4.0 - 10.5 K/uL   RBC 4.25 3.87 - 5.11 MIL/uL   Hemoglobin 13.3 12.0 - 15.0 g/dL   HCT 86.5 78.4 - 69.6 %   MCV 95.5 80.0 - 100.0 fL   MCH 31.3 26.0 - 34.0 pg   MCHC 32.8 30.0 - 36.0 g/dL   RDW 29.5 28.4 - 13.2 %   Platelets 202 150 - 400 K/uL   nRBC 0.0 0.0 - 0.2 %     Comment: Performed at [facility], [address] [place], [place] [address]  Basic metabolic panel     Status: Abnormal   Collection Time: 04/30/23  2:20 AM  Result Value Ref Range   Sodium 135 135 - 145 mmol/L   Potassium 3.8 3.5 - 5.1 mmol/L   Chloride 97 (L) 98 - 111 mmol/L   CO2 28 22 - 32 mmol/L   Glucose, Bld 111 (H) 70 - 99 mg/dL    Comment: Glucose reference range applies only to samples taken after fasting for at least 8 hours.   BUN 20 8 - 23 mg/dL   Creatinine, Ser 2.72 0.44 - 1.00 mg/dL   Calcium 8.6 (L) 8.9 - 10.3 mg/dL   GFR, Estimated >53 >66 mL/min    Comment: (NOTE) Calculated using the CKD-EPI Creatinine Equation (2021)    Anion gap 10 5 - 15    Comment: Performed at [facility], [address], [place], [place] [address]  Brain natriuretic peptide     Status: None   Collection Time: 04/30/23  2:20 AM  Result Value Ref Range   B Natriuretic Peptide 41.2 0.0 - 100.0 pg/mL    Comment: Performed at [facility], [address], [place], [place] [address]         has a past medical history of Arthritis, Asthma, Chronic dermatitis (10/19/2019), GERD (gastroesophageal reflux disease), Heart murmur, and Hypertension.   reports that she has never smoked. She has never used smokeless tobacco.  Past Surgical History:  Procedure Laterality Date   CHOLECYSTECTOMY     TONSILLECTOMY     TOTAL HIP ARTHROPLASTY Right 08/22/2014   Procedure: RIGHT TOTAL HIP ARTHROPLASTY ANTERIOR APPROACH;  Surgeon: Durene Romans, MD;  Location: [facility];  Service: Orthopedics;  Laterality: Right;   TOTAL HIP ARTHROPLASTY Left 01/2019    Allergies  Allergen Reactions   Advair Diskus [Fluticasone-Salmeterol]     Per patient, she thinks she developed walking PNA.    Avelox [Moxifloxacin Hcl In Nacl] Other (See Comments)    Muscle Aches   Ciprofloxacin Hives   Irbesartan Other (See Comments)   Levofloxacin Other (See Comments)    Tendon pain   Olmesartan Other (See  Comments)   Other Other (See Comments)   Protonix [Pantoprazole Sodium] Diarrhea   Trimethoprim Hives    Immunization History  Administered Date(s) Administered   Influenza,inj,Quad PF,6+ Mos 01/26/2018, 12/13/2018   Influenza-Unspecified 01/13/2012, 12/16/2019   Pneumococcal Conjugate-13 04/14/2016   Tdap 11/12/2009    Family History  Problem Relation Age of Onset   Heart disease Father    Allergic rhinitis Neg Hx    Angioedema Neg Hx    Asthma Neg Hx    Eczema Neg Hx    Immunodeficiency Neg Hx    Urticaria Neg Hx      Current Outpatient Medications:    benzonatate (TESSALON) 200 MG capsule, Take 1 capsule (200 mg total) by mouth 3 (three) times daily as needed for cough., Disp: 30 capsule, Rfl: 1   predniSONE (DELTASONE) 10 MG tablet, Take 6 tabs by mouth for 3 days, then 5 for 3 days, 4 for 3 days, 3 for 3 days, 2 for 3 days, 1 for 3 days and stop, Disp: 63 tablet, Rfl: 0   albuterol (PROAIR HFA) 108 (90 Base) MCG/ACT inhaler, Inhale 2 puffs into the lungs every 4 (four) hours as needed., Disp: 18 g, Rfl: 1   albuterol (PROVENTIL) (2.5 MG/3ML) 0.083% nebulizer solution, USE 1 VIAL IN NEBULIZER EVERY 4 HOURS AS NEEDED FOR WHEEZING OR  SHORTNESS  OF  BREATH, Disp: 600 mL, Rfl: 0   azelastine (ASTELIN) 0.1 % nasal spray, 1 spray as needed for rhinitis or allergies., Disp: , Rfl:    benzonatate (TESSALON) 200 MG capsule, Take 200 mg by mouth 3 (three) times daily as needed for cough., Disp: , Rfl:    betaxolol (BETOPTIC-S) 0.5 % ophthalmic suspension, Place 1 drop into both eyes 2 (two) times daily., Disp: , Rfl:    budesonide-formoterol (SYMBICORT) 160-4.5 MCG/ACT inhaler, Inhale 2 puffs into the lungs in the morning and at bedtime. with spacer and rinse mouth afterwards., Disp: 1 each, Rfl: 11   calcium-vitamin D (OSCAL WITH D) 500-200 MG-UNIT per tablet, Take 1 tablet by mouth daily with breakfast., Disp: , Rfl:    chlorpheniramine-HYDROcodone (TUSSIONEX) 10-8 MG/5ML, Take 5 mLs  by mouth at bedtime as needed for up to 7 days (refractory  cough)., Disp: 35 mL, Rfl: 0   diazepam (VALIUM) 5 MG tablet, Take 5 mg by mouth every 6 (six) hours as needed for anxiety., Disp: , Rfl:    dorzolamide (TRUSOPT) 2 % ophthalmic solution, Place 1 drop into both eyes 2 (two) times daily., Disp: , Rfl:    EPINEPHrine 0.3 mg/0.3 mL IJ SOAJ injection, Inject 0.3 mg into the muscle as needed for anaphylaxis., Disp: 2 each, Rfl: 1   Ferrous Sulfate (IRON) 325 (65 Fe) MG TABS, Take 1 tablet by mouth daily., Disp: , Rfl:    fexofenadine (ALLEGRA) 180 MG tablet, Take 180 mg by mouth daily., Disp: , Rfl:    furosemide (LASIX) 20 MG tablet, Take 40 mg by mouth every morning., Disp: , Rfl:    glucosamine-chondroitin 500-400 MG tablet, Take 1 tablet by mouth 2 (two) times daily., Disp: , Rfl:    latanoprost (XALATAN) 0.005 % ophthalmic solution, Place 1 drop into both eyes at bedtime., Disp: , Rfl:    lisinopril (ZESTRIL) 20 MG tablet, Take 40 mg by mouth daily., Disp: , Rfl:    montelukast (SINGULAIR) 10 MG tablet, Take 1 tablet (10 mg total) by mouth at bedtime., Disp: 30 tablet, Rfl: 5   Multiple Vitamin (MULTIVITAMIN WITH MINERALS) TABS tablet, Take 1 tablet by mouth daily., Disp: , Rfl:    Omega-3 Fatty Acids (OMEGA 3 PO), Take 1 capsule by mouth daily., Disp: , Rfl:    omeprazole (PRILOSEC) 20 MG capsule, Take 1 capsule (20 mg total) by mouth daily., Disp: 30 capsule, Rfl: 5   predniSONE (DELTASONE) 10 MG tablet, Take 6 tablets (60 mg total) by mouth daily for 2 days, THEN 4 tablets (40 mg total) daily for 2 days, THEN 2 tablets (20 mg total) daily for 2 days., Disp: 24 tablet, Rfl: 0   PRESCRIPTION MEDICATION, once a week., Disp: , Rfl:    SPIRIVA HANDIHALER 18 MCG inhalation capsule, Place 18 mcg into inhaler and inhale daily., Disp: , Rfl:    vitamin C (ASCORBIC ACID) 500 MG tablet, Take 500 mg by mouth daily., Disp: , Rfl:   Current Facility-Administered Medications:    omalizumab Geoffry Paradise)  injection 300 mg, 300 mg, Subcutaneous, Q28 days, Kozlow, Alvira Philips, MD, 300 mg at 04/01/23 0949      Objective:   Vitals:   05/01/23 0850  BP: 98/65  Pulse: 100  SpO2: 91%  Weight: 201 lb 9.6 oz (91.4 kg)  Height: 5\' 3"  (1.6 m)    Estimated body mass index is 35.71 kg/m as calculated from the following:   Height as of this encounter: 5\' 3"  (1.6 m).   Weight as of this encounter: 201 lb 9.6 oz (91.4 kg).  @WEIGHTCHANGE @  [employer]   05/01/23 0850  Weight: 201 lb 9.6 oz (91.4 kg)     Physical Exam   General: No distress. OBESE O2 at rest: YES 3L Cane present: no Sitting in wheel chair: no Frail: no Obese: YES Neuro: Alert and Oriented x 3. GCS 15. Speech normal Psych: Pleasant Resp:  Barrel Chest - no.  Wheeze - no, Crackles - no, No overt respiratory distress CVS: Normal heart sounds. Murmurs - no Ext: Stigmata of Connective Tissue Disease - non HEENT: Normal upper airway. PEERL +. No post nasal drip        Assessment:       ICD-10-CM   1. History of 2019 novel coronavirus disease (COVID-19)  Z86.16     2. History of acute respiratory failure  Z87.09     3. Physical deconditioning  R53.81     4. Chronic cough  R05.3          Plan:     Patient Instructions     ICD-10-CM   1. History of 2019 novel coronavirus disease (COVID-19)  Z86.16     2. History of acute respiratory failure  Z87.09     3. Physical deconditioning  R53.81        Glad you are doing better but still needing 3L Hilda though you are 88% at rest on room air for 5 mintutes  Stil some ways to go  Plan  - Extend prednisone taper as follows 60 mg daily for 1 week followed by 40 mg daily for 1 week followed by 30 mg daily for 1 week followed by 10 mg daily for 1 week followed by 5 mg daily for 1 week followed by 5 mg on Monday Wednesday Friday for 1 week and stop  -Continue oxygen but keep monitoring pulse ox and keep it greater than 88% [3 L nasal cannula current  requirement]  -Check blood sugars at home intermittently because of prednisone  -Tessalon cough.  Her milligrams 3 times daily as needed for at least 1 month and 2 refills  -Referral home physical therapy  -Monitor for leg swelling and development of blood clot which you are at risk for SO keep moving  - support FMLA for daughter  Follow-up - 4 weeks video visit with nurse practitioner or face-to-face visit  - CXR/CT timing to be decided at follouwp esp with hix of nodules   FOLLOWUP Return in about 4 weeks (around 05/29/2023) for Chronic Respiratory Failure, Face to Face OR Video Visit, with any of the APPS.    SIGNATURE    Dr. Kalman Shan, M.D., F.C.C.P,  Pulmonary and Critical Care Medicine Staff Physician, [facility] Center Director - Interstitial Lung Disease  Program  Pulmonary Fibrosis [facility] [place], [place], [address]  Pager: [phone], If no answer or between  15:00h - 7:00h: call [phone] Telephone: [phone]  9:03 AM 05/01/2023

## 2023-05-02 NOTE — Discharge Summary (Signed)
Physician Discharge Summary   Patient: Melanie Armstrong MRN: [id number] DOB: 12-Nov-1955  Admit date:     04/19/2023  Discharge date: 04/30/2023  Discharge Physician: Marolyn Haller   PCP: Noberto Retort, MD   Recommendations at discharge:    None  Discharge Diagnoses: Principal Problem:   Pneumonia due to COVID-19 virus Active Problems:   Asthma with acute exacerbation   Sepsis (HCC)   Hypokalemia   Essential hypertension   Anxiety   GERD (gastroesophageal reflux disease)  Resolved Problems:   * No resolved hospital problems. *  Hospital Course: Melanie Armstrong is a 68 y.o. female with a history of hypertension, asthma, arthritis, obesity.  Patient presented secondary to shortness of breath, coughing, wheezing was found to have severe hypoxia.  Workup significant for COVID-19 positivity.  CTA chest significant for multifocal infiltrates concerning for multifocal pneumonia.  Patient started empirically on ceftriaxone and azithromycin.  Patient was started on steroids and remdesivir for COVID-19 infection/pneumonia.  Patient is requiring heated high flow up to 50 L/min at 100% FiO2. Actemra ordered on 1/7.   She has remained hospitalized due to her symptoms and oxygen requirement.   Assessment and Plan:  COVID Pneumonia  Acute respiratory failure with hypoxia.   Presented with worsening shortness of breath, cough for a week. COVID PCR positive. CTA chest significant for multifocal infiltrates concerning for multifocal pneumonia.  Treatment: Initially started on IV remdesivir, IV steroids. With worsening hypoxia, Actemra was given on 1/7 and repeated again on 1/8.  Continued on IV Solu-Medrol 80 mg daily throughout hospitalization. Because of the clinical severity of her pna, patient was also given 5-day course of empiric antibiotic treatment with ceftriaxone and azithromycin.  Completed on 1/9. Procalcitonin not elevated, CRP improved. Blood culture with no growth.   Required  up to 35 L of heated high flow oxygen. Required 3L [place] by the time of discharge. Her O2 sats were in the low 90s, however an ABG was obtained which showed her O2 saturations in the upper 90s. Patient also had improvement in her dyspnea by the time of discharge.   She will discharge on a prednisone taper and cough suppressants. Discussed with patient that she should schedule follow up with her pulmonologist within the week. She will also monitor her O2 sats and her symptoms. If her O2 sats are persistently below 92 percent or she gets more short of breath she will come to the emergency room.   Severe persistent asthma exacerbation Secondary to COVID-19 with pneumonia. No wheezing was noted on exam, and ABG obtained the day prior to discharge was without elevated CO2.  She was Continued on bronchodilators.:  Singulair, Claritin, Pulmicort, Brovana, Incruse Ellipta, albuterol. These were continued on discharge.   Primary hypertension  Patient was maintained on home lasix of 40mg . Her lisinopril was resumed on discharge.         Consultants: Pulmonology  Procedures performed: None  Disposition: Home Diet recommendation:  Regular diet DISCHARGE MEDICATION: Allergies as of 04/30/2023       Reactions   Advair Diskus [fluticasone-salmeterol]    Per patient, she thinks she developed walking PNA.    Avelox [moxifloxacin Hcl In Nacl] Other (See Comments)   Muscle Aches   Ciprofloxacin Hives   Irbesartan Other (See Comments)   Levofloxacin Other (See Comments)   Tendon pain   Olmesartan Other (See Comments)   Other Other (See Comments)   Protonix [pantoprazole Sodium] Diarrhea   Trimethoprim Hives  Medication List     STOP taking these medications    clarithromycin 500 MG tablet Commonly known as: BIAXIN   DSS 100 MG Caps   promethazine-dextromethorphan 6.25-15 MG/5ML syrup Commonly known as: PROMETHAZINE-DM       TAKE these medications    albuterol (2.5 MG/3ML)  0.083% nebulizer solution Commonly known as: PROVENTIL USE 1 VIAL IN NEBULIZER EVERY 4 HOURS AS NEEDED FOR WHEEZING OR  SHORTNESS  OF  BREATH   albuterol 108 (90 Base) MCG/ACT inhaler Commonly known as: ProAir HFA Inhale 2 puffs into the lungs every 4 (four) hours as needed.   ascorbic acid 500 MG tablet Commonly known as: VITAMIN C Take 500 mg by mouth daily.   azelastine 0.1 % nasal spray Commonly known as: ASTELIN 1 spray as needed for rhinitis or allergies.   benzonatate 200 MG capsule Commonly known as: TESSALON Take 200 mg by mouth 3 (three) times daily as needed for cough.   betaxolol 0.5 % ophthalmic suspension Commonly known as: BETOPTIC-S Place 1 drop into both eyes 2 (two) times daily.   budesonide-formoterol 160-4.5 MCG/ACT inhaler Commonly known as: Symbicort Inhale 2 puffs into the lungs in the morning and at bedtime. with spacer and rinse mouth afterwards.   calcium-vitamin D 500-200 MG-UNIT tablet Commonly known as: OSCAL WITH D Take 1 tablet by mouth daily with breakfast.   chlorpheniramine-HYDROcodone 10-8 MG/5ML Commonly known as: TUSSIONEX Take 5 mLs by mouth at bedtime as needed for up to 7 days (refractory cough).   diazepam 5 MG tablet Commonly known as: VALIUM Take 5 mg by mouth every 6 (six) hours as needed for anxiety.   dorzolamide 2 % ophthalmic solution Commonly known as: TRUSOPT Place 1 drop into both eyes 2 (two) times daily.   EPINEPHrine 0.3 mg/0.3 mL Soaj injection Commonly known as: EPI-PEN Inject 0.3 mg into the muscle as needed for anaphylaxis.   fexofenadine 180 MG tablet Commonly known as: ALLEGRA Take 180 mg by mouth daily.   furosemide 20 MG tablet Commonly known as: LASIX Take 40 mg by mouth every morning.   glucosamine-chondroitin 500-400 MG tablet Take 1 tablet by mouth 2 (two) times daily.   Iron 325 (65 Fe) MG Tabs Take 1 tablet by mouth daily.   latanoprost 0.005 % ophthalmic solution Commonly known as:  XALATAN Place 1 drop into both eyes at bedtime.   lisinopril 20 MG tablet Commonly known as: ZESTRIL Take 40 mg by mouth daily.   montelukast 10 MG tablet Commonly known as: SINGULAIR Take 1 tablet (10 mg total) by mouth at bedtime.   multivitamin with minerals Tabs tablet Take 1 tablet by mouth daily.   OMEGA 3 PO Take 1 capsule by mouth daily.   omeprazole 20 MG capsule Commonly known as: PRILOSEC Take 1 capsule (20 mg total) by mouth daily.   predniSONE 10 MG tablet Commonly known as: DELTASONE Take 6 tablets (60 mg total) by mouth daily for 2 days, THEN 4 tablets (40 mg total) daily for 2 days, THEN 2 tablets (20 mg total) daily for 2 days. Start taking on: May 01, 2023 What changed: See the new instructions.   PRESCRIPTION MEDICATION once a week.   Spiriva HandiHaler 18 MCG inhalation capsule Generic drug: tiotropium Place 18 mcg into inhaler and inhale daily.        Follow-up Information     Kalman Shan, MD. Schedule an appointment as soon as possible for a visit in 1 week(s).   Specialty: Pulmonary Disease  Contact information: [address] [place] [address] [phone]                Discharge Exam: Ceasar Mons Weights   04/28/23 0503 04/29/23 0430 04/30/23 0341  Weight: 90.7 kg 90.1 kg 90.8 kg   Physical Exam  Constitutional: In no distress.  Cardiovascular: Normal rate, regular rhythm. No lower extremity edema  Pulmonary: Non labored breathing on [place], no wheezing or rales.   Abdominal: Soft. Normal bowel sounds. Non distended and non tender Musculoskeletal: Normal range of motion.     Neurological: Alert and oriented to person, place, and time. Non focal  Skin: Skin is warm and dry.    Condition at discharge: fair  The results of significant diagnostics from this hospitalization (including imaging, microbiology, ancillary and laboratory) are listed below for reference.   Imaging Studies: CT Angio Chest Pulmonary  Embolism (PE) W or WO Contrast Result Date: 04/19/2023 CLINICAL DATA:  Positive D-dimer EXAM: CT ANGIOGRAPHY CHEST WITH CONTRAST TECHNIQUE: Multidetector CT imaging of the chest was performed using the standard protocol during bolus administration of intravenous contrast. Multiplanar CT image reconstructions and MIPs were obtained to evaluate the vascular anatomy. RADIATION DOSE REDUCTION: This exam was performed according to the departmental dose-optimization program which includes automated exposure control, adjustment of the mA and/or kV according to patient size and/or use of iterative reconstruction technique. CONTRAST:  59mL OMNIPAQUE IOHEXOL 350 MG/ML SOLN COMPARISON:  Chest x-ray from earlier in the same day, chest CT from 12/18/2021. FINDINGS: Cardiovascular: Atherosclerotic calcifications of the thoracic aorta are noted. No aneurysmal dilatation is seen. Pulmonary artery shows a normal branching pattern bilaterally. No filling defect to suggest pulmonary embolism is noted. Heavy coronary calcifications are noted. No cardiac enlargement is seen. Mediastinum/Nodes: Thoracic inlet is within normal limits. No hilar or mediastinal adenopathy is noted. The esophagus as visualized is within normal limits. Lungs/Pleura: Lungs show diffuse bilateral infiltrates worsened when compared with the prior exam from earlier in the same day. Large fat containing lesion is noted within the left lower lobe measuring 19 mm stable in appearance from the prior exam. The smaller pleural based density in the posterior left lower lobe inferiorly is less well visualized due to the surrounding inflammatory changes. No sizable effusion is noted. No other parenchymal nodule is seen. Upper Abdomen: Small hiatal hernia is noted. Gallbladder has been surgically removed. Musculoskeletal: Degenerative changes of the thoracic spine are noted. No compression deformity is seen. Review of the MIP images confirms the above findings. IMPRESSION:  No evidence of pulmonary emboli. Diffuse increasing bilateral airspace opacities consistent with worsening multifocal pneumonia. Fat containing left lower lobe nodule consistent with a hamartoma stable from the prior exam. Other previously seen nodules are less well appreciated due to the underlying inflammatory change. Aortic Atherosclerosis (ICD10-I70.0). Electronically Signed   By: Alcide Clever M.D.   On: 04/19/2023 17:32   DG Chest Port 1 View Result Date: 04/19/2023 CLINICAL DATA:  68 year old female with history of shortness of breath, cough and fever. EXAM: PORTABLE CHEST 1 VIEW COMPARISON:  Chest x-ray 08/14/2017. FINDINGS: Diffuse interstitial prominence and peribronchial cuffing noted in the lungs bilaterally suggesting a background of bronchitis. Patchy ill-defined opacities are noted throughout the left mid to lower lung, concerning for bronchopneumonia. Probable small left pleural effusion. No definite right pleural effusion. No pneumothorax. No evidence of pulmonary edema. Heart size is borderline enlarged. The patient is rotated to the left on today's exam, resulting in distortion of the mediastinal contours  and reduced diagnostic sensitivity and specificity for mediastinal pathology. IMPRESSION: 1. The appearance of the chest is most compatible with bronchitis with probable left-sided bronchopneumonia. Followup PA and lateral chest X-ray is recommended in 3-4 weeks following trial of antibiotic therapy to ensure resolution and exclude underlying malignancy. Electronically Signed   By: Trudie Reed M.D.   On: 04/19/2023 09:51    Microbiology: Results for orders placed or performed during the hospital encounter of 04/19/23  Blood culture (routine x 2)     Status: None   Collection Time: 04/19/23  9:02 AM   Specimen: BLOOD LEFT HAND  Result Value Ref Range Status   Specimen Description BLOOD LEFT HAND  Final   Special Requests   Final    BOTTLES DRAWN AEROBIC AND ANAEROBIC Blood Culture  results may not be optimal due to an inadequate volume of blood received in culture bottles   Culture   Final    NO GROWTH 5 DAYS Performed at [facility], [address] [place], [place] [address]    Report Status 04/24/2023 FINAL  Final  Resp panel by RT-PCR (RSV, Flu A&B, Covid) Anterior Nasal Swab     Status: Abnormal   Collection Time: 04/19/23  9:10 AM   Specimen: Anterior Nasal Swab  Result Value Ref Range Status   SARS Coronavirus 2 by RT PCR POSITIVE (A) NEGATIVE Final   Influenza A by PCR NEGATIVE NEGATIVE Final   Influenza B by PCR NEGATIVE NEGATIVE Final    Comment: (NOTE) The Xpert Xpress SARS-CoV-2/FLU/RSV plus assay is intended as an aid in the diagnosis of influenza from Nasopharyngeal swab specimens and should not be used as a sole basis for treatment. Nasal washings and aspirates are unacceptable for Xpert Xpress SARS-CoV-2/FLU/RSV testing.  Fact Sheet for Patients: BloggerCourse.com  Fact Sheet for Healthcare Providers: SeriousBroker.it  This test is not yet approved or cleared by the [place] FDA and has been authorized for detection and/or diagnosis of SARS-CoV-2 by FDA under an Emergency Use Authorization (EUA). This EUA will remain in effect (meaning this test can be used) for the duration of the COVID-19 declaration under Section 564(b)(1) of the Act, 21 U.S.C. section 360bbb-3(b)(1), unless the authorization is terminated or revoked.     Resp Syncytial Virus by PCR NEGATIVE NEGATIVE Final    Comment: (NOTE) Fact Sheet for Patients: BloggerCourse.com  Fact Sheet for Healthcare Providers: SeriousBroker.it  This test is not yet approved or cleared by the [place] FDA and has been authorized for detection and/or diagnosis of SARS-CoV-2 by FDA under an Emergency Use Authorization (EUA). This EUA will remain in effect (meaning this test  can be used) for the duration of the COVID-19 declaration under Section 564(b)(1) of the Act, 21 U.S.C. section 360bbb-3(b)(1), unless the authorization is terminated or revoked.  Performed at [facility], [address], [place], [place] [address]   Blood culture (routine x 2)     Status: None   Collection Time: 04/19/23  9:10 AM   Specimen: BLOOD RIGHT WRIST  Result Value Ref Range Status   Specimen Description BLOOD RIGHT WRIST  Final   Special Requests   Final    BOTTLES DRAWN AEROBIC AND ANAEROBIC Blood Culture adequate volume   Culture   Final    NO GROWTH 5 DAYS Performed at [facility], [address] [place], [place] [address]    Report Status 04/24/2023 FINAL  Final  MRSA Next Gen by PCR, Nasal  Status: None   Collection Time: 04/19/23 11:34 AM   Specimen: Nasal Mucosa; Nasal Swab  Result Value Ref Range Status   MRSA by PCR Next Gen NOT DETECTED NOT DETECTED Final    Comment: (NOTE) The GeneXpert MRSA Assay (FDA approved for NASAL specimens only), is one component of a comprehensive MRSA colonization surveillance program. It is not intended to diagnose MRSA infection nor to guide or monitor treatment for MRSA infections. Test performance is not FDA approved in patients less than 72 years old. Performed at [facility], [address] [place], [place] [address]     Labs: CBC: Recent Labs  Lab 04/29/23 0232 04/30/23 0220  WBC 9.8 8.0  NEUTROABS 7.6  --   HGB 13.4 13.3  HCT 40.8 40.6  MCV 95.6 95.5  PLT 223 202   Basic Metabolic Panel: Recent Labs  Lab 04/29/23 0232 04/30/23 0220  NA 138 135  K 4.3 3.8  CL 100 97*  CO2 29 28  GLUCOSE 116* 111*  BUN 19 20  CREATININE 0.59 0.79  CALCIUM 8.8* 8.6*   Liver Function Tests: No results for input(s): "AST", "ALT", "ALKPHOS", "BILITOT", "PROT", "ALBUMIN" in the last 168 hours. CBG: No results for input(s): "GLUCAP" in the last 168 hours.  Discharge time spent: greater than 30  minutes.  Signed: Marolyn Haller, MD [facility] 05/02/2023

## 2023-05-06 ENCOUNTER — Telehealth: Payer: Self-pay | Admitting: Internal Medicine

## 2023-05-06 NOTE — Telephone Encounter (Signed)
Daughter is dropping off FMLA paperwork that will be placed in Darilyn's box. It is for her on behalf to care for her mother.

## 2023-05-10 ENCOUNTER — Encounter: Payer: Self-pay | Admitting: Allergy

## 2023-05-12 ENCOUNTER — Telehealth: Payer: Self-pay | Admitting: Internal Medicine

## 2023-05-12 NOTE — Telephone Encounter (Signed)
Patient's daughter, Lurlean Kernen, dropped off a FMLA form to cover her absence while she takes care of the patient following hospital discharge.  I called [name] and confirmed the dates on the FMLA form need to be 05/11/2023 to 06/12/2023.   I let her know Dr. Marchelle Gearing will not be in the office to sign the form until Jan 30 and, at her request, I called the [employer] HR Department and left a voice message letting them know about the delay in processing the form.  Per [name], she will be providing assistance with ADL's, medications and transportation to medical appointments will all doctors, not just pulmonary.   Form is filled out and ready for Ramaswamy's signature.

## 2023-05-13 ENCOUNTER — Ambulatory Visit (INDEPENDENT_AMBULATORY_CARE_PROVIDER_SITE_OTHER): Payer: Medicare Other

## 2023-05-13 DIAGNOSIS — J4550 Severe persistent asthma, uncomplicated: Secondary | ICD-10-CM | POA: Diagnosis not present

## 2023-05-20 NOTE — Telephone Encounter (Signed)
 Signed FMLA form was faxed to [facility] [employer] fax# [phone] and emailed to patient's daughter, Biviana Saddler.  It was also emailed to patient's supervisor at [email or web address] and to [employer] at [email or web address].

## 2023-06-08 ENCOUNTER — Ambulatory Visit: Payer: Medicare Other

## 2023-06-08 ENCOUNTER — Ambulatory Visit (INDEPENDENT_AMBULATORY_CARE_PROVIDER_SITE_OTHER): Payer: Medicare Other | Admitting: Internal Medicine

## 2023-06-08 ENCOUNTER — Encounter: Payer: Self-pay | Admitting: Internal Medicine

## 2023-06-08 VITALS — BP 141/83 | HR 103 | Temp 98.6°F | Ht 63.0 in | Wt 220.8 lb

## 2023-06-08 DIAGNOSIS — R0902 Hypoxemia: Secondary | ICD-10-CM

## 2023-06-08 DIAGNOSIS — Z8709 Personal history of other diseases of the respiratory system: Secondary | ICD-10-CM | POA: Diagnosis not present

## 2023-06-08 DIAGNOSIS — Z8616 Personal history of COVID-19: Secondary | ICD-10-CM

## 2023-06-08 DIAGNOSIS — R5381 Other malaise: Secondary | ICD-10-CM | POA: Diagnosis not present

## 2023-06-08 NOTE — Progress Notes (Signed)
 IOV 09/08/2017  Chief Complaint  Patient presents with   Pulm Consult    Referred by Dr. Wyvonnia Dusky for left pulmonary nodules. Per patient, has 2 nodules on the left. Patient has a history of asthma and SOB.      Melanie Armstrong is a 68 year old non-smoker who works on a desk job doing Games developer.  She is here referred for left lung nodules.  The background history is that she has been on lisinopril for 15 years.  She carries a diagnosis of asthma allergic that is managed by our local allergist.  She tells me that she is been on Symbicort for at least 10 years and Spiriva for at least 5 years and the last couple of years Xolair.  She describes herself as severe persistent asthma.  Her recent spirometry May 2019 personal visualization shows no normal FEV1 and FVC.  Earlier in spring 2019 she had respiratory infection following exposure to sick contacts at work.  Since then she has had significant cough and bronchitis requiring at least 2 rounds of prednisone and antibiotics.  Things are improving and currently the cough is rated as a 3 out of 10.  She tells me as part of this persistent bronchitic cough symptoms she did have a chest x-ray that showed left lung nodule that then resulted in the CT scan of the chest Aug 20, 2017 that shows a 1.7 cm left lung nodule that is dense and I personally visualized and agree with the radiologist interpretation that this is a hamartoma.  There is another 9 mm subpleural left lower lobe posterior segment nodule and that the main reason for the referral today.  The CT scan also shows coronary artery calcification but she denies any chest pain.  She is a non-smoker.  Although when she was a kid her parents smoked.   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   Indeterminate 9 mm pulmonary nodule in posterior left lower lobe. Consider one of the following in 3  months for both low-risk and high-risk individuals: (a) repeat chest CT, (b) follow-up PET-CT, or (c) tissue sampling. This recommendation follows the consensus statement: Guidelines for Management of Incidental Pulmonary Nodules Detected on CT Images: From the Fleischner Society 2017; Radiology 2017; 284:228-243.   Incidental findings: Aortic Atherosclerosis (ICD10-I70.0). Coronary artery calcification. Tiny hiatal hernia. Small benign right adrenal adenoma.     Electronically Signed   By: Myles Rosenthal M.D.   On: 08/21/2017 10:50   OV 11/30/2017  Chief Complaint  Patient presents with   Follow-up    CT 8/16.  Pt also had cards referral 8/9. Pt states she has been doing well since last visit and denies any complaints.     Follow-up lung nodules -  9 mm left lower lobe in the setting of stable 1.7 cm benign left lower lobe hamartoma  -  history of asthma followed by allergist.  Is a routine follow-up. She had a 3 month CT scan of the chest that is documented below. The 9 mm left lower lobe nodule is stable but in May 2019 and August 2019. The left lower lobe, was also stable. In terms of asthma she follows with an allergist. She is on ACE inhibitor but she says she has no cough and she does not feel a need for medication to be discontinued.   Ct Chest Wo Contrast  Result Date: 11/27/2017  CLINICAL DATA:  Pulmonary nodule follow-up. EXAM: CT CHEST WITHOUT CONTRAST TECHNIQUE: Multidetector CT imaging of the chest was performed following the standard protocol without IV contrast. COMPARISON:  08/20/2017 FINDINGS: Cardiovascular: The heart size is normal. No substantial pericardial effusion. Coronary artery calcification is evident. Atherosclerotic calcification is noted in the wall of the thoracic aorta. Mediastinum/Nodes: No mediastinal lymphadenopathy. No evidence for gross hilar lymphadenopathy although assessment is limited by the lack of intravenous contrast on today's study. The  esophagus has normal imaging features. There is no axillary lymphadenopathy. Lungs/Pleura: The central tracheobronchial airways are patent. Scattered peripheral tiny pulmonary nodules are similar to prior. 1.7 cm nodule left lower lobe contains central fat density consistent with benign hamartoma. 9 mm peripheral left lower lobe nodule seen on the prior study persists at 9 mm today (3:109). No pleural effusion. Upper Abdomen: Stable 15 mm right adrenal adenoma. Musculoskeletal: No worrisome lytic or sclerotic osseous abnormality. IMPRESSION: 1. 9 mm left lower lobe pulmonary nodule is stable in the interval. Three-month stable imaging follow-up is reassuring. Repeat CT chest without contrast in 6-12 months recommended to ensure continued stability. 2. Stable 1.7 cm benign pulmonary hamartoma. 3. No change scattered tiny peripheral nodules in both lungs. 4.  Aortic Atherosclerois (ICD10-170.0) Electronically Signed   By: Kennith Center M.D.   On: 11/27/2017 16:11   OV 12/13/2018  Subjective:  Patient ID: Melanie Armstrong, female , DOB: 08-Nov-1955 , age 60 y.o. , MRN: [id number] , ADDRESS: [address] [place] [address]   12/13/2018 -   Chief Complaint  Patient presents with   Nodule of lower lobe of left lung    Follow-up lung nodules -  9 mm left lower lobe in the setting of separate stable 1.7 cm benign left lower lobe hamartoma  - Hx of  history of asthma followed by allergist. - nasal inhalers, symbicort, spiriva, xolair - Also on ace inhibitors without cough   HPI Melanie Armstrong 68 y.o. -is now over 1 year since  last saw Ms. Henion.  Overall she is stable and doing well.  No major issues in the interim.  She is due for a flu shot and is willing to have it today.  She had a CT scan of the chest October 19, 2018 as a follow-up to August 29 CT scan of the chest.  The left lower lobe 0.8/0.9 cm pulmonary nodule is stable.  Her hematoma is also stable.  She does have coronary artery atherosclerosis.   She says she has seen Dr. Eldridge Dace and has been reassured.  She has allergic asthma and sees an outside allergist.  She is on nasal inhalers, Symbicort, Spiriva and Xolair.  She is also on ACE inhibitor lisinopril.  She states this does not cause cough or make the asthma worse    IMPRESSION: CT chest 10/19/2018 1. Irregular posterior left lower lobe 0.8 cm pulmonary nodule is stable since 08/20/2017 chest CT, more likely benign. Suggest continued follow-up chest CT in 12 months given the irregular morphology of this nodule. 2. Separate stable benign left lower lobe pulmonary hamartoma. 3. Three-vessel coronary atherosclerosis. 4. Small hiatal hernia.   Aortic Atherosclerosis (ICD10-I70.0).     Electronically Signed   By: Delbert Phenix M.D.   On: 10/19/2018 14:09 ROS - per HPI    OV 12/26/2019  Subjective:  Patient ID: Melanie Armstrong, female , DOB: 12-05-1955 , age 79 y.o. , MRN: [id number] , ADDRESS: [address] [place] [address]  12/26/2019 -   Chief Complaint  Patient presents with   Follow-up    go over ct results   Follow-up lung nodules -  9 mm left lower lobe in the setting of separate stable 1.7 cm benign left lower lobe hamartoma  - Hx of  history of asthma followed by allergist. - nasal inhalers, symbicort, spiriva, xolair - Also on ace inhibitors without cough   HPI Melanie Armstrong 68 y.o. -presents for follow-up of the above issues.  Her asthma is under well control.  She is on Spiriva Symbicort and Xolair.  She does not want to reduce this.  She uses albuterol for rescue once a week.  Her allergist manages this.  She had a CT scan of the chest in August 2021 for her left lower lobe hamartoma in the chest and also left lower lobe nodule.  These are stable.  There are some spiculation reported around her nodule in the left lower lobe therefore the radiologist recommended doing another CT scan in 1 year.  This is despite the fact the nodule has been stable for 2  years.  I discussed with her the risks of getting another CT scan such as radiation but she is willing to have another CT scan done in 1 year.  There are no other active complaints.  She has had a flu shot and also the Covid vaccine.   IMPRESSION: 1. 7 x 7 mm LEFT lower lobe pulmonary nodule with irregular margins measures 7 x 7 mm on the study of 08/20/2017. Stability over 2 years time argues for benign etiology. Given irregular margins could consider continued surveillance at a 12 month interval. 2. Scattered small pulmonary nodules throughout the upper lobes also without interval change, potentially related to prior infection or inflammation. 3. Presumed pulmonary hamartoma is unchanged in the LEFT chest measuring approximately 1.9 x 1.9 cm. 4. Three-vessel coronary artery disease. 5. Small hiatal hernia. 6. Aortic atherosclerosis.   Aortic Atherosclerosis (ICD10-I70.0).     Electronically Signed   By: Donzetta Kohut M.D.   On: 11/28/2019 13:47      OV 01/04/2021  Subjective:  Patient ID: Melanie Armstrong, female , DOB: January 22, 1956 , age 80 y.o. , MRN: [id number] , ADDRESS: [address] [place] [address] PCP Johny Blamer, MD Patient Care Team: Johny Blamer, MD as PCP - General (Family Medicine)  This Provider for this visit: Treatment Team:  Attending Provider: Kalman Shan, MD    01/04/2021 -   Chief Complaint  Patient presents with   Follow-up    Pt is here today to discuss results of recent CT. States she has been doing okay since last visit and denies any complaints.     HPI Melanie Armstrong 68 y.o. -returns for follow-up.  Her asthma and allergies being maintained by Dr. Tiburcio Pea and also her allergist Dr. Selena Batten.  She tells me that currently her asthma is active and she is going to finish day 14 of clarithromycin shortly.  She is also going to do 5 days of prednisone starting today from her primary care physician.  In the context of this she had a CT  scan of the chest without contrast.  She has new micronodules in the right upper lobe anterior region right behind the right breast on the image.  These look very infectious.  Her old left lower lobe nodule and her left lower lobe hematoma are stable in my personal visualization.  I showed her these images.  She had a question about switching her Symbicort and Spiriva to Trelegy.  Apparently her allergist has recommended this.  I have supported this plan.   Of note her daughter is Adelai Achey who is a Engineer, civil (consulting) in [address] at [employer]  Narrative & Impression  CLINICAL DATA:  Polyp lung nodule   EXAM: CT CHEST WITHOUT CONTRAST   TECHNIQUE: Multidetector CT imaging of the chest was performed following the standard protocol without IV contrast.   COMPARISON:  CT chest dated July 29, 2019   FINDINGS: Cardiovascular: Normal heart size. No pericardial effusion. Three-vessel coronary artery calcifications. Atherosclerotic disease of the thoracic aorta.   Mediastinum/Nodes: Small hiatal hernia. Thyroid is unremarkable. No pathologically enlarged lymph nodes in the chest.   Lungs/Pleura: Central airways are patent. No consolidation, pleural effusion or pneumothorax. New clustered small nodules of the right upper lobe. Fat containing solid pulmonary nodule of the left lower lobe located on series 5, image 65 is unchanged in size compared to prior exam measuring 1.9 x 1.9 cm additional smaller juxtapleural nodule in the left lower lobe is unchanged in size compared to prior measuring 0.7 x 0.7 cm. On image 88.   Upper Abdomen: Cholecystectomy clips stable right adrenal nodule measuring 1.2 cm, likely benign adenoma. Probable small mL the left kidney measuring 0.6 cm. No acute findings.   Musculoskeletal: No chest wall mass or suspicious bone lesions identified.   IMPRESSION: Stable solid left lower lobe pulmonary nodule measuring 0.7 cm.   New cluster of small nodules seen in the right  upper lobe, likely infectious or inflammatory.   Stable left lower lobe pulmonary hamartoma.   Aortic Atherosclerosis (ICD10-I70.0).     Electronically Signed   By: Allegra Lai M.D.   On: 12/21/2020 08:41      OV 03/28/2022  Subjective:  Patient ID: Melanie Armstrong, female , DOB: 1956/03/06 , age 31 y.o. , MRN: [id number] , ADDRESS: [address] [place] [address] PCP Johny Blamer, MD Patient Care Team: Johny Blamer, MD as PCP - General (Family Medicine)  This Provider for this visit: Treatment Team:  Attending Provider: Kalman Shan, MD    03/28/2022 -   Chief Complaint  Patient presents with   Follow-up    Pt is here following up after recent CT.  Pt states she has been doing okay since last visit and denies any complaints.     HPI Melanie Armstrong 68 y.o. -returns for follow-up after 1 year.  She is doing well.  She has been on antibiotics 3-4 times this year because of sinus issues but no prednisone.  She has had cataract surgery knee surgery and glaucoma surgery this year but otherwise well.  Around 02/25/2022 she had a sinus infection and took antibiotics clarithromycin with primary care physician.  Asthma is well-controlled on Spiriva Singulair and also Spiriva.  She says overall she is doing well.  She has not had RSV vaccine.  I did counsel her about this and she will have it at [facility].  We discussed the ACE inhibitor intake.  She says she cannot tolerate losartan and other ARB's.  Therefore she is opted to be on lisinopril.  It is not causing any cough.  She feels it is not causing any asthma flares.    CT Chest data 12/18/21  Narrative & Impression  CLINICAL DATA:  Follow-up right upper lobe pulmonary nodule, low cancer risk. History of asthma.   EXAM: CT CHEST WITHOUT CONTRAST   TECHNIQUE:  Multidetector CT imaging of the chest was performed following the standard protocol without IV contrast.   RADIATION DOSE REDUCTION: This exam was  performed according to the departmental dose-optimization program which includes automated exposure control, adjustment of the mA and/or kV according to patient size and/or use of iterative reconstruction technique.   COMPARISON:  Multiple priors including most recent chest CT December 20, 2020.   FINDINGS: Cardiovascular: Aortic atherosclerosis. Three-vessel coronary artery calcifications. Normal size heart. No significant pericardial effusion/thickening.   Mediastinum/Nodes: No suspicious thyroid nodule. No pathologically enlarged mediastinal, hilar or axillary lymph nodes. Small hiatal hernia.   Lungs/Pleura: Stable size of the macroscopic fat containing solid pulmonary nodules in the left lower lobe measuring 18 x 18 mm on image 71/5 previously 19 x 19 mm. Additional smaller juxta pleural nodule in the left lower lobe is also stable in size measuring 7 x 6 mm on image 102/5 previously 7 x 7 mm. Tiny clustered nodules in the right upper lobe for instance on images 30-34/5 are similar prior and likely infectious or inflammatory. New clustered nodularity in the left upper lobe for instance on image 29/5 and 37/5. No pleural effusion. No pneumothorax.   Upper Abdomen: Gallbladder surgically absent.   Musculoskeletal: Multilevel degenerative changes spine.   IMPRESSION: 1. Stable solid 7 mm left lower lobe pulmonary nodule. 2. Stable clustered nodules in the right upper lobe with new clustered nodules in the left upper lobe, likely infectious or inflammatory. 3. Stable left lower lobe pulmonary hamartoma. 4.  Aortic Atherosclerosis (ICD10-I70.0).     Electronically Signed   By: Maudry Mayhew M.D.   On: 12/18/2021 15:07      OV 05/01/2023  Subjective:  Patient ID: Melanie Armstrong, female , DOB: Mar 08, 1956 , age 66 y.o. , MRN: [id number] , ADDRESS: [address] [place] [address] PCP Noberto Retort, MD Patient Care Team: Noberto Retort, MD as PCP -  General (Family Medicine)  This Provider for this visit: Treatment Team:  Attending Provider: Kalman Shan, MD    05/01/2023 -   Chief Complaint  Patient presents with   Follow-up    Hospital f/u 04/30/23, pt has covid pneumonia, concerned about getting better. Using 3L continuous flow, pt is concerned about o2 machine.      HPI Melanie Armstrong 68 y.o. -this is a posthospital follow-up for COVID-pneumonia and respiratory failure.  She was on high flow oxygen.  She was also on BiPAP at night.  Pulmonary embolism was ruled out.  Most recent set of labs was Apr 30, 2023 which was yesterday with a normal creatinine.,  Normal hemoglobin and white count.  Last chest x-ray was 04/19/2023 with left lower lobe infiltrates.  She is currently here for posthospital follow-up.  She got discharged yesterday.  Daughter is here with her daughter independent historian.  Sick significantly better at 1 point she was on 50% oxygen.  Currently discharge at 3 L nasal cannula.  Here on room air at rest she is 89-90% for 5 minutes.  Even at the bathroom yesterday she was 89% on room air.  She says she is physically tired and weak.  She has prednisone taper for just 1 week.  I did indicate to her that we might have to extend this.  She is open to this idea.  This based on anecdotal clinical experience.  She does not have diabetes but daughter indicated she can monitor blood sugars at home.  They wanted Tessalon cough Perles.  OV 06/08/2023  Subjective:  Patient ID: Melanie Armstrong, female , DOB: July 10, 1955 , age 23 y.o. , MRN: [id number] , ADDRESS: [address] [place] [address] PCP Noberto Retort, MD Patient Care Team: Noberto Retort, MD as PCP - General (Family Medicine)  This Provider for this visit: Treatment Team:  Attending Provider: Kalman Shan, MD    06/08/2023 -   Chief Complaint  Patient presents with   Follow-up      Follow-up lung nodules -  9 mm left lower lobe in the  setting of separate stable 1.7 cm benign left lower lobe hamartoma  - Hx of  history of asthma followed by allergist. - nasal inhalers, symbicort, spiriva, xolair - Also on ace inhibitors without cough -New right upper lobe micronodules in the setting of infectious symptoms September 2022 -> stble sept 20-23 -New clustered left upper lobe nodularity September 2023  #COVID respiratory failure January 2025.   HPI Melanie Armstrong 68 y.o. -I saw 1 day after the hospitalization in mid January 2025 the office.  She was beginning to feel better but still requiring 3 L oxygen.  When I saw her she was 88% on room air at rest.  I gave her a 6-week prednisone taper but she and the daughter tells me Huel Cote is an independent historian today] that she finished the prednisone 1 week ago which would be in a 4-week taper at 10 mg/day.  It appears that the 5 mg dose was never returned.  Nevertheless she is a lot better.  She says that she still feels like she needs oxygen.  When she walks from the kitchen to the living room which is around 10 feet she gets very dyspneic and uses the oxygen which relieves her shortness of breath. OVerall she got steroids for 6 weeks   Her main concern right now is that her oxygen from the hospital was denied by insurance company because some order was not signed.  I have personally emailed the case manager of record Lawerance Sabal and the hospitalist Dr. Marolyn Haller.  Here today she is 92% on room air at rest after being on room air for several minutes.   Last imaging early Jan 2025 at time of admit    Simple office walk 224 (66+46 x 2) feet Pod A at [facility] x  3 laps goal with forehead probe 06/08/2023  06/08/2023   O2 used ra 2 L [place]  Number laps completed X 2 laps X 2 laps  Comments about pace Forehed probe Normal s;low Fore head probe. Slow pace  Resting Pulse Ox/HR 98% and 106/min 96% ad HR 107  Final Pulse Ox/HR 88% and 139/min 94% and HR 125  Desaturated </= 88%  YES NO  Desaturated <= 3% points yes NO  Got Tachycardic >/= 90/min yes YES  Symptoms at end of test Severe dyspnea and  Mild but improvdd dyspnea  Miscellaneous comments x Correctd with 2L [place]     PFT      No data to display             LAB RESULTS last 96 hours No results found.       has a past medical history of Arthritis, Asthma, Chronic dermatitis (10/19/2019), GERD (gastroesophageal reflux disease), Heart murmur, and Hypertension.   reports that she has never smoked. She has never used smokeless tobacco.  Past Surgical History:  Procedure Laterality Date   CHOLECYSTECTOMY     TONSILLECTOMY  TOTAL HIP ARTHROPLASTY Right 08/22/2014   Procedure: RIGHT TOTAL HIP ARTHROPLASTY ANTERIOR APPROACH;  Surgeon: Durene Romans, MD;  Location: [facility];  Service: Orthopedics;  Laterality: Right;   TOTAL HIP ARTHROPLASTY Left 01/2019    Allergies  Allergen Reactions   Advair Diskus [Fluticasone-Salmeterol]     Per patient, she thinks she developed walking PNA.    Avelox [Moxifloxacin Hcl In Nacl] Other (See Comments)    Muscle Aches   Ciprofloxacin Hives   Irbesartan Other (See Comments)   Levofloxacin Other (See Comments)    Tendon pain   Olmesartan Other (See Comments)   Other Other (See Comments)   Protonix [Pantoprazole Sodium] Diarrhea   Trimethoprim Hives    Immunization History  Administered Date(s) Administered   Influenza,inj,Quad PF,6+ Mos 01/26/2018, 12/13/2018   Influenza-Unspecified 01/13/2012, 12/16/2019   Pneumococcal Conjugate-13 04/14/2016   Tdap 11/12/2009    Family History  Problem Relation Age of Onset   Heart disease Father    Allergic rhinitis Neg Hx    Angioedema Neg Hx    Asthma Neg Hx    Eczema Neg Hx    Immunodeficiency Neg Hx    Urticaria Neg Hx      Current Outpatient Medications:    albuterol (PROAIR HFA) 108 (90 Base) MCG/ACT inhaler, Inhale 2 puffs into the lungs every 4 (four) hours as needed., Disp: 18 g, Rfl: 1    albuterol (PROVENTIL) (2.5 MG/3ML) 0.083% nebulizer solution, USE 1 VIAL IN NEBULIZER EVERY 4 HOURS AS NEEDED FOR WHEEZING OR  SHORTNESS  OF  BREATH, Disp: 600 mL, Rfl: 0   azelastine (ASTELIN) 0.1 % nasal spray, 1 spray as needed for rhinitis or allergies., Disp: , Rfl:    benzonatate (TESSALON) 200 MG capsule, Take 200 mg by mouth 3 (three) times daily as needed for cough., Disp: , Rfl:    benzonatate (TESSALON) 200 MG capsule, Take 1 capsule (200 mg total) by mouth 3 (three) times daily as needed for cough., Disp: 30 capsule, Rfl: 1   betaxolol (BETOPTIC-S) 0.5 % ophthalmic suspension, Place 1 drop into both eyes 2 (two) times daily., Disp: , Rfl:    budesonide-formoterol (SYMBICORT) 160-4.5 MCG/ACT inhaler, Inhale 2 puffs into the lungs in the morning and at bedtime. with spacer and rinse mouth afterwards., Disp: 1 each, Rfl: 11   calcium-vitamin D (OSCAL WITH D) 500-200 MG-UNIT per tablet, Take 1 tablet by mouth daily with breakfast., Disp: , Rfl:    diazepam (VALIUM) 5 MG tablet, Take 5 mg by mouth every 6 (six) hours as needed for anxiety., Disp: , Rfl:    dorzolamide (TRUSOPT) 2 % ophthalmic solution, Place 1 drop into both eyes 2 (two) times daily., Disp: , Rfl:    EPINEPHrine 0.3 mg/0.3 mL IJ SOAJ injection, Inject 0.3 mg into the muscle as needed for anaphylaxis., Disp: 2 each, Rfl: 1   Ferrous Sulfate (IRON) 325 (65 Fe) MG TABS, Take 1 tablet by mouth daily., Disp: , Rfl:    fexofenadine (ALLEGRA) 180 MG tablet, Take 180 mg by mouth daily., Disp: , Rfl:    furosemide (LASIX) 20 MG tablet, Take 40 mg by mouth every morning., Disp: , Rfl:    glucosamine-chondroitin 500-400 MG tablet, Take 1 tablet by mouth 2 (two) times daily., Disp: , Rfl:    latanoprost (XALATAN) 0.005 % ophthalmic solution, Place 1 drop into both eyes at bedtime., Disp: , Rfl:    lisinopril (ZESTRIL) 20 MG tablet, Take 40 mg by mouth daily., Disp: , Rfl:  montelukast (SINGULAIR) 10 MG tablet, Take 1 tablet (10 mg  total) by mouth at bedtime., Disp: 30 tablet, Rfl: 5   Multiple Vitamin (MULTIVITAMIN WITH MINERALS) TABS tablet, Take 1 tablet by mouth daily., Disp: , Rfl:    Omega-3 Fatty Acids (OMEGA 3 PO), Take 1 capsule by mouth daily., Disp: , Rfl:    omeprazole (PRILOSEC) 20 MG capsule, Take 1 capsule (20 mg total) by mouth daily., Disp: 30 capsule, Rfl: 5   predniSONE (DELTASONE) 10 MG tablet, Take 6 tabs by mouth for 3 days, then 5 for 3 days, 4 for 3 days, 3 for 3 days, 2 for 3 days, 1 for 3 days and stop, Disp: 63 tablet, Rfl: 0   PRESCRIPTION MEDICATION, once a week., Disp: , Rfl:    SPIRIVA HANDIHALER 18 MCG inhalation capsule, Place 18 mcg into inhaler and inhale daily., Disp: , Rfl:    vitamin C (ASCORBIC ACID) 500 MG tablet, Take 500 mg by mouth daily., Disp: , Rfl:   Current Facility-Administered Medications:    omalizumab Geoffry Paradise) injection 300 mg, 300 mg, Subcutaneous, Q28 days, [place], Alvira Philips, MD, 300 mg at 05/13/23 0845      Objective:   Vitals:   06/08/23 1533  BP: (!) 141/83  Pulse: (!) 103  Temp: 98.6 F (37 C)  TempSrc: Oral  SpO2: 96%  Weight: 220 lb 12.8 oz (100.2 kg)  Height: 5\' 3"  (1.6 m)    Estimated body mass index is 39.11 kg/m as calculated from the following:   Height as of this encounter: 5\' 3"  (1.6 m).   Weight as of this encounter: 220 lb 12.8 oz (100.2 kg).  @WEIGHTCHANGE @  [employer]   06/08/23 1533  Weight: 220 lb 12.8 oz (100.2 kg)     Physical Exam   General: No distress.  She looks a lot better. O2 at rest: She was on it but I turned it off Cane present: no Sitting in wheel chair: non Frail: NO Obese: YES Neuro: Alert and Oriented x 3. GCS 15. Speech normal Psych: Pleasant Resp:  Barrel Chest - no.  Wheeze - no, Crackles - no, No overt respiratory distress CVS: Normal heart sounds. Murmurs - no Ext: Stigmata of Connective Tissue Disease - no HEENT: Normal upper airway. PEERL +. No post nasal drip        Assessment:        ICD-10-CM   1. History of 2019 novel coronavirus disease (COVID-19)  Z86.16 DG Chest 2 View    2. History of acute respiratory failure  Z87.09 DG Chest 2 View    3. Physical deconditioning  R53.81 DG Chest 2 View    4. Exercise hypoxemia  R09.02          Plan:     Patient Instructions     ICD-10-CM   1. History of 2019 novel coronavirus disease (COVID-19)  Z86.16 DG Chest 2 View    2. History of acute respiratory failure  Z87.09 DG Chest 2 View    3. Physical deconditioning  R53.81 DG Chest 2 View    4. Exercise hypoxemia  R09.02        You are much better.  Oxygen status is improved but still requiring oxygen with exertion  Plan  - Monitor off  prednisone -Continue oxygen at night and with exertion -Chest x-ray two-view today - written to case manager inquiring about o2 order from hospital  Follow-up - 6 weeks visit with nurse practitioner   - CT  timing to be decided at [facility] esp with hix of nodules   FOLLOWUP Return in about 6 weeks (around 07/20/2023) for with any of the APPS, Face to Face Visit.     ( Level 05 visit E&M 2024: Estb >= 40 min  in  visit type: on-site physical face to visit  in total care time and counseling or/and coordination of care by this undersigned MD - Dr Kalman Shan. This includes one or more of the following on this same day 06/08/2023: pre-charting, chart review, note writing, documentation discussion of test results, diagnostic or treatment recommendations, prognosis, risks and benefits of management options, instructions, education, compliance or risk-factor reduction. It excludes time spent by the CMA or office staff in the care of the patient. Actual time 40 min)   SIGNATURE    Dr. Kalman Shan, M.D., F.C.C.P,  Pulmonary and Critical Care Medicine Staff Physician, [facility] Center Director - Interstitial Lung Disease  Program  Pulmonary Fibrosis [facility] [place], [place], [address]  Pager: [phone], If no answer or between  15:00h - 7:00h: call [phone] Telephone: [phone]  4:36 PM 06/08/2023

## 2023-06-08 NOTE — Patient Instructions (Addendum)
 ICD-10-CM   1. History of 2019 novel coronavirus disease (COVID-19)  Z86.16 DG Chest 2 View    2. History of acute respiratory failure  Z87.09 DG Chest 2 View    3. Physical deconditioning  R53.81 DG Chest 2 View    4. Exercise hypoxemia  R09.02        You are much better.  Oxygen status is improved but still requiring oxygen with exertion  Plan  - Monitor off  prednisone -Continue oxygen at night and with exertion -Chest x-ray two-view today - written to case manager inquiring about o2 order from hospital  Follow-up - 6 weeks visit with nurse practitioner   - CT timing to be decided at [facility] esp with hix of nodules

## 2023-06-10 ENCOUNTER — Ambulatory Visit (INDEPENDENT_AMBULATORY_CARE_PROVIDER_SITE_OTHER): Payer: Medicare Other | Admitting: *Deleted

## 2023-06-10 DIAGNOSIS — J4550 Severe persistent asthma, uncomplicated: Secondary | ICD-10-CM

## 2023-06-19 ENCOUNTER — Ambulatory Visit: Payer: Medicare Other | Admitting: Internal Medicine

## 2023-06-22 DIAGNOSIS — H401123 Primary open-angle glaucoma, left eye, severe stage: Secondary | ICD-10-CM | POA: Insufficient documentation

## 2023-07-08 ENCOUNTER — Ambulatory Visit: Payer: Medicare Other | Admitting: *Deleted

## 2023-07-08 DIAGNOSIS — J4550 Severe persistent asthma, uncomplicated: Secondary | ICD-10-CM

## 2023-07-20 ENCOUNTER — Ambulatory Visit: Payer: Medicare Other | Admitting: Adult Health

## 2023-07-20 ENCOUNTER — Encounter: Payer: Self-pay | Admitting: Adult Health

## 2023-07-20 DIAGNOSIS — H40041 Steroid responder, right eye: Secondary | ICD-10-CM | POA: Insufficient documentation

## 2023-07-23 ENCOUNTER — Encounter: Payer: Self-pay | Admitting: Internal Medicine

## 2023-07-23 DIAGNOSIS — Z01818 Encounter for other preprocedural examination: Secondary | ICD-10-CM | POA: Insufficient documentation

## 2023-07-23 NOTE — Telephone Encounter (Signed)
 Yes given her health issues propofol related sedation will be needed to do this cataract surgery.  Is a surgery going to be done in the hospital?  Ordering an outpatient ambulatory center?  And if they give her propofol she needs to be intubated.  Please make sure she checks these factors with anesthesia.  Also ask her how long the surgery will be.  If it is a short surgery and they going to intubate her then it can be done in the outpatient ambulatory procedure center.  Otherwise it will need to be done in a hospital

## 2023-07-24 ENCOUNTER — Ambulatory Visit: Admitting: Primary Care

## 2023-07-28 HISTORY — PX: OTHER SURGICAL HISTORY: SHX169

## 2023-07-29 DIAGNOSIS — Z9883 Filtering (vitreous) bleb after glaucoma surgery status: Secondary | ICD-10-CM | POA: Insufficient documentation

## 2023-08-05 ENCOUNTER — Ambulatory Visit

## 2023-08-05 DIAGNOSIS — J4550 Severe persistent asthma, uncomplicated: Secondary | ICD-10-CM

## 2023-08-25 ENCOUNTER — Ambulatory Visit: Admitting: Podiatry

## 2023-08-31 ENCOUNTER — Encounter: Payer: Self-pay | Admitting: Podiatry

## 2023-08-31 ENCOUNTER — Ambulatory Visit (INDEPENDENT_AMBULATORY_CARE_PROVIDER_SITE_OTHER): Admitting: Podiatry

## 2023-08-31 DIAGNOSIS — L600 Ingrowing nail: Secondary | ICD-10-CM

## 2023-08-31 DIAGNOSIS — J4520 Mild intermittent asthma, uncomplicated: Secondary | ICD-10-CM | POA: Insufficient documentation

## 2023-08-31 DIAGNOSIS — M1612 Unilateral primary osteoarthritis, left hip: Secondary | ICD-10-CM | POA: Insufficient documentation

## 2023-08-31 DIAGNOSIS — R600 Localized edema: Secondary | ICD-10-CM | POA: Insufficient documentation

## 2023-08-31 DIAGNOSIS — R7303 Prediabetes: Secondary | ICD-10-CM | POA: Insufficient documentation

## 2023-08-31 DIAGNOSIS — R911 Solitary pulmonary nodule: Secondary | ICD-10-CM | POA: Insufficient documentation

## 2023-08-31 DIAGNOSIS — M7741 Metatarsalgia, right foot: Secondary | ICD-10-CM

## 2023-08-31 DIAGNOSIS — J301 Allergic rhinitis due to pollen: Secondary | ICD-10-CM | POA: Insufficient documentation

## 2023-08-31 DIAGNOSIS — M1990 Unspecified osteoarthritis, unspecified site: Secondary | ICD-10-CM | POA: Insufficient documentation

## 2023-08-31 MED ORDER — NEOMYCIN-POLYMYXIN-HC 1 % OT SOLN: OTIC | 1 refills | Status: DC

## 2023-08-31 NOTE — Patient Instructions (Signed)

## 2023-08-31 NOTE — Progress Notes (Signed)
 Subjective:  Patient ID: Melanie Armstrong, female    DOB: 1956-03-14,  MRN: [id number] HPI Chief Complaint  Patient presents with   Toe Pain    Hallux right - lateral border, tender since Jan, has had previous ingrown procedure before   New Patient (Initial Visit)    Est pt 06/2020    68 y.o. female presents with the above complaint.   ROS: Denies fever chills nausea vomit muscle aches pains calf pain back pain chest pain shortness of breath.  Past Medical History:  Diagnosis Date   Arthritis    Asthma    Chronic dermatitis 10/19/2019   GERD (gastroesophageal reflux disease)    Heart murmur    as a child    Hypertension    Past Surgical History:  Procedure Laterality Date   CHOLECYSTECTOMY     TONSILLECTOMY     TOTAL HIP ARTHROPLASTY Right 08/22/2014   Procedure: RIGHT TOTAL HIP ARTHROPLASTY ANTERIOR APPROACH;  Surgeon: Claiborne Crew, MD;  Location: [facility];  Service: Orthopedics;  Laterality: Right;   TOTAL HIP ARTHROPLASTY Left 01/2019    Current Outpatient Medications:    acetaZOLAMIDE (DIAMOX) 250 MG tablet, Take 250-500 mg by mouth daily., Disp: , Rfl:    NEOMYCIN -POLYMYXIN-HYDROCORTISONE (CORTISPORIN) 1 % SOLN OTIC solution, Apply 1-2 drops to toe BID after soaking, Disp: 10 mL, Rfl: 1   albuterol  (PROAIR  HFA) 108 (90 Base) MCG/ACT inhaler, Inhale 2 puffs into the lungs every 4 (four) hours as needed., Disp: 18 g, Rfl: 1   albuterol  (PROVENTIL ) (2.5 MG/3ML) 0.083% nebulizer solution, USE 1 VIAL IN NEBULIZER EVERY 4 HOURS AS NEEDED FOR WHEEZING OR  SHORTNESS  OF  BREATH, Disp: 600 mL, Rfl: 0   azelastine  (ASTELIN ) 0.1 % nasal spray, 1 spray as needed for rhinitis or allergies., Disp: , Rfl:    betaxolol  (BETOPTIC -S) 0.5 % ophthalmic suspension, Place 1 drop into both eyes 2 (two) times daily., Disp: , Rfl:    budesonide -formoterol  (SYMBICORT ) 160-4.5 MCG/ACT inhaler, Inhale 2 puffs into the lungs in the morning and at bedtime. with spacer and rinse mouth afterwards., Disp: 1  each, Rfl: 11   calcium-vitamin D (OSCAL WITH D) 500-200 MG-UNIT per tablet, Take 1 tablet by mouth daily with breakfast., Disp: , Rfl:    diazepam  (VALIUM ) 5 MG tablet, Take 5 mg by mouth every 6 (six) hours as needed for anxiety., Disp: , Rfl:    dorzolamide  (TRUSOPT ) 2 % ophthalmic solution, Place 1 drop into both eyes 2 (two) times daily., Disp: , Rfl:    EPINEPHrine  0.3 mg/0.3 mL IJ SOAJ injection, Inject 0.3 mg into the muscle as needed for anaphylaxis., Disp: 2 each, Rfl: 1   Ferrous Sulfate  (IRON) 325 (65 Fe) MG TABS, Take 1 tablet by mouth daily., Disp: , Rfl:    fexofenadine (ALLEGRA) 180 MG tablet, Take 180 mg by mouth daily., Disp: , Rfl:    furosemide  (LASIX ) 20 MG tablet, Take 40 mg by mouth every morning., Disp: , Rfl:    glucosamine-chondroitin 500-400 MG tablet, Take 1 tablet by mouth 2 (two) times daily., Disp: , Rfl:    latanoprost  (XALATAN ) 0.005 % ophthalmic solution, Place 1 drop into both eyes at bedtime., Disp: , Rfl:    lisinopril  (ZESTRIL ) 20 MG tablet, Take 40 mg by mouth daily., Disp: , Rfl:    montelukast  (SINGULAIR ) 10 MG tablet, Take 1 tablet (10 mg total) by mouth at bedtime., Disp: 30 tablet, Rfl: 5   Multiple Vitamin (MULTIVITAMIN WITH MINERALS) TABS tablet,  Take 1 tablet by mouth daily., Disp: , Rfl:    Omega-3 Fatty Acids (OMEGA 3 PO), Take 1 capsule by mouth daily., Disp: , Rfl:    omeprazole  (PRILOSEC) 20 MG capsule, Take 1 capsule (20 mg total) by mouth daily., Disp: 30 capsule, Rfl: 5   PRESCRIPTION MEDICATION, once a week., Disp: , Rfl:    SPIRIVA  HANDIHALER 18 MCG inhalation capsule, Place 18 mcg into inhaler and inhale daily., Disp: , Rfl:    vitamin C  (ASCORBIC ACID ) 500 MG tablet, Take 500 mg by mouth daily., Disp: , Rfl:   Current Facility-Administered Medications:    omalizumab  (XOLAIR ) injection 300 mg, 300 mg, Subcutaneous, Q28 days, Kozlow, Eric J, MD, 300 mg at 08/05/23 0831  Allergies  Allergen Reactions   Advair Diskus  [Fluticasone -Salmeterol]     Per patient, she thinks she developed walking PNA.    Avelox [Moxifloxacin Hcl In Nacl] Other (See Comments)    Muscle Aches   Ciprofloxacin Hives   Irbesartan Other (See Comments)   Levofloxacin Other (See Comments)    Tendon pain   Olmesartan Other (See Comments)   Other Other (See Comments)   Protonix [Pantoprazole Sodium] Diarrhea   Trimethoprim Hives   Sulfamethoxazole-Trimethoprim Rash   Review of Systems Objective:  There were no vitals filed for this visit.  General: Well developed, nourished, in no acute distress, alert and oriented x3   Dermatological: Skin is warm, dry and supple bilateral. Nails x 10 are well maintained; remaining integument appears unremarkable at this time. There are no open sores, no preulcerative lesions, no rash or signs of infection present.  Incurvated nail margin fibular border hallux right foot.  Mild erythema no cellulitis drainage or odor  Vascular: Dorsalis Pedis artery and Posterior Tibial artery pedal pulses are 2/4 bilateral with immedate capillary fill time. Pedal hair growth present. No varicosities and no lower extremity edema present bilateral.   Neruologic: Grossly intact via light touch bilateral. Vibratory intact via tuning fork bilateral. Protective threshold with Semmes Wienstein monofilament intact to all pedal sites bilateral. Patellar and Achilles deep tendon reflexes 2+ bilateral. No Babinski or clonus noted bilateral.   Musculoskeletal: No gross boney pedal deformities bilateral. No pain, crepitus, or limitation noted with foot and ankle range of motion bilateral. Muscular strength 5/5 in all groups tested bilateral.  Gait: Unassisted, Nonantalgic.    Radiographs:  None taken  Assessment & Plan:   Assessment: Ingrown toenail fibular border hallux right  Plan: Chemical matrixectomy was performed after local anesthesia was administered.  The margin of the nail was split from distal to proximal  and removed in toto exposing the root.  3 applications of phenol was applied to the root group 30 seconds each.  She was given both oral written home-going instructions of care and soaking of the toe as well as a prescription for Cortisporin Otic to be applied twice daily after soaking.  Follow-up with her in 2 weeks     Shadoe Bethel T. [place], [place]

## 2023-09-02 ENCOUNTER — Ambulatory Visit (INDEPENDENT_AMBULATORY_CARE_PROVIDER_SITE_OTHER)

## 2023-09-02 DIAGNOSIS — J4550 Severe persistent asthma, uncomplicated: Secondary | ICD-10-CM

## 2023-09-11 ENCOUNTER — Ambulatory Visit: Admitting: Adult Health

## 2023-09-11 VITALS — BP 132/82 | HR 80 | Ht 63.0 in | Wt 211.8 lb

## 2023-09-11 DIAGNOSIS — R911 Solitary pulmonary nodule: Secondary | ICD-10-CM

## 2023-09-11 DIAGNOSIS — J9611 Chronic respiratory failure with hypoxia: Secondary | ICD-10-CM | POA: Diagnosis not present

## 2023-09-11 DIAGNOSIS — Z8709 Personal history of other diseases of the respiratory system: Secondary | ICD-10-CM

## 2023-09-11 DIAGNOSIS — J4550 Severe persistent asthma, uncomplicated: Secondary | ICD-10-CM

## 2023-09-11 DIAGNOSIS — R0683 Snoring: Secondary | ICD-10-CM | POA: Insufficient documentation

## 2023-09-11 NOTE — Assessment & Plan Note (Signed)
 COVID-pneumonia with acute respiratory failure during hospitalization January 2025.  Patient required oxygen at discharge.  Over the last several months patient has clinically improved.  Oxygen demands have decreased.  Walk test today in office shows no significant desaturations on room air.  May discontinue oxygen.

## 2023-09-11 NOTE — Assessment & Plan Note (Signed)
 Continue to follow with serial CT imaging.  Set up for CT chest in 1 month.

## 2023-09-11 NOTE — Patient Instructions (Addendum)
 Set up for CT chest in 1 month  Set up for home sleep study  May discontinue Oxygen Activity as tolerated.  Follow up in 6 weeks to discuss results and treatment options.

## 2023-09-11 NOTE — Progress Notes (Signed)
 @Patient  ID: Melanie Armstrong, female    DOB: January 19, 1956, 68 y.o.   MRN: [id number]  Chief Complaint  Patient presents with   Follow-up    Referring provider: Roselind Congo, MD  HPI: 68 yo female never smoker followed for lung nodules and Covid Pneumonia with respiratory failure January 2025 (Treated with BIPAP/High flow O2 Medical history significant for Asthma and allergies followed by allergist .   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  Indeterminate 9 mm pulmonary nodule in posterior left lower lobe.  CT chest 04/2023 neg for PE, Bilateral aspdz, multifocal pneumonia , LLL harmartoma.    09/11/2023 Follow up: Lung nodules, O2RF and Pneumonia  Patient returns for a 54-month follow-up.  Patient had hospitalization January 2025 with COVID pneumonia and acute respiratory failure.  She was discharged home on oxygen.  Patient says that she has been clinically improving since last visit.  She has not used her oxygen in over 2 weeks.  Walk test today in the office shows no significant desaturations with ambulation on room air.  O2 saturations remained at 93 to 95% on room air walking in the office.  Patient says overall she has been doing well.  She remains active.  Denies any significant cough or wheezing. Patient has a history of scattered pulmonary nodules followed on serial CT imaging. During hospital stay in January CT chest showed bilateral airspace disease with multifocal pneumonia and stable LLL harmartoma.  She says she has had a recent sinus infection was seen by her primary care provider and is completing an antibiotic course symptoms have improved. She has not worn her oxygen at bedtime in several weeks.  She has no history of congestive heart failure or stroke.  Does have significant snoring some restless sleep and daytime sleepiness.  Has never had a sleep  study.  She does not take any sleep aids.  Occasionally takes a nap.  Patient does have severe persistent asthma and allergies.  She is followed by the asthma and allergy clinic.  She remains on Symbicort  and Spiriva  daily.  Takes Allegra Singulair  daily.  She is on Xolair .  Says overall breathing has been doing okay.  She denies any cough or wheezing.  Allergies  Allergen Reactions   Advair Diskus [Fluticasone -Salmeterol]     Per patient, she thinks she developed walking PNA.    Avelox [Moxifloxacin Hcl In Nacl] Other (See Comments)    Muscle Aches   Ciprofloxacin Hives   Irbesartan Other (See Comments)   Levofloxacin Other (See Comments)    Tendon pain   Olmesartan Other (See Comments)   Other Other (See Comments)   Protonix [Pantoprazole Sodium] Diarrhea   Trimethoprim Hives   Sulfamethoxazole-Trimethoprim Rash    Immunization History  Administered Date(s) Administered   Fluzone Influenza virus vaccine,trivalent (IIV3), split virus 12/13/2018   Influenza,inj,Quad PF,6+ Mos 01/26/2018, 12/13/2018   Influenza-Unspecified 01/13/2012, 12/16/2019   PFIZER(Purple Top)SARS-COV-2 Vaccination 07/07/2019, 07/22/2019, 02/03/2020, 07/20/2020   PNEUMOCOCCAL CONJUGATE-20 08/27/2021   Pfizer Covid-19 Vaccine Bivalent Booster 58yrs & up 01/25/2021   Pneumococcal Conjugate-13 04/14/2016   Pneumococcal Polysaccharide-23 04/14/1994   Respiratory Syncytial Virus Vaccine,Recomb Aduvanted(Arexvy) 05/14/2022   Td 12/23/2019   Tdap 12/23/2006, 11/12/2009   Zoster, Live 01/25/2016    Past Medical History:  Diagnosis Date   Arthritis    Asthma    Chronic dermatitis 10/19/2019   GERD (gastroesophageal reflux disease)  Heart murmur    as a child    Hypertension     Tobacco History: Social History   Tobacco Use  Smoking Status Never  Smokeless Tobacco Never   Counseling given: Not Answered   Outpatient Medications Prior to Visit  Medication Sig Dispense Refill   acetaZOLAMIDE  (DIAMOX) 250 MG tablet Take 250-500 mg by mouth daily.     albuterol  (PROAIR  HFA) 108 (90 Base) MCG/ACT inhaler Inhale 2 puffs into the lungs every 4 (four) hours as needed. 18 g 1   albuterol  (PROVENTIL ) (2.5 MG/3ML) 0.083% nebulizer solution USE 1 VIAL IN NEBULIZER EVERY 4 HOURS AS NEEDED FOR WHEEZING OR  SHORTNESS  OF  BREATH 600 mL 0   azelastine  (ASTELIN ) 0.1 % nasal spray 1 spray as needed for rhinitis or allergies.     betaxolol  (BETOPTIC -S) 0.5 % ophthalmic suspension Place 1 drop into both eyes 2 (two) times daily.     budesonide -formoterol  (SYMBICORT ) 160-4.5 MCG/ACT inhaler Inhale 2 puffs into the lungs in the morning and at bedtime. with spacer and rinse mouth afterwards. 1 each 11   calcium-vitamin D (OSCAL WITH D) 500-200 MG-UNIT per tablet Take 1 tablet by mouth daily with breakfast.     diazepam  (VALIUM ) 5 MG tablet Take 5 mg by mouth every 6 (six) hours as needed for anxiety.     dorzolamide  (TRUSOPT ) 2 % ophthalmic solution Place 1 drop into both eyes 2 (two) times daily.     EPINEPHrine  0.3 mg/0.3 mL IJ SOAJ injection Inject 0.3 mg into the muscle as needed for anaphylaxis. 2 each 1   Ferrous Sulfate  (IRON) 325 (65 Fe) MG TABS Take 1 tablet by mouth daily.     fexofenadine (ALLEGRA) 180 MG tablet Take 180 mg by mouth daily.     furosemide  (LASIX ) 20 MG tablet Take 40 mg by mouth every morning.     glucosamine-chondroitin 500-400 MG tablet Take 1 tablet by mouth 2 (two) times daily.     latanoprost  (XALATAN ) 0.005 % ophthalmic solution Place 1 drop into both eyes at bedtime.     lisinopril  (ZESTRIL ) 20 MG tablet Take 40 mg by mouth daily.     montelukast  (SINGULAIR ) 10 MG tablet Take 1 tablet (10 mg total) by mouth at bedtime. 30 tablet 5   Multiple Vitamin (MULTIVITAMIN WITH MINERALS) TABS tablet Take 1 tablet by mouth daily.     NEOMYCIN -POLYMYXIN-HYDROCORTISONE (CORTISPORIN) 1 % SOLN OTIC solution Apply 1-2 drops to toe BID after soaking 10 mL 1   Omega-3 Fatty Acids (OMEGA 3  PO) Take 1 capsule by mouth daily.     omeprazole  (PRILOSEC) 20 MG capsule Take 1 capsule (20 mg total) by mouth daily. 30 capsule 5   PRESCRIPTION MEDICATION once a week.     SPIRIVA  HANDIHALER 18 MCG inhalation capsule Place 18 mcg into inhaler and inhale daily.     vitamin C  (ASCORBIC ACID ) 500 MG tablet Take 500 mg by mouth daily.     Facility-Administered Medications Prior to Visit  Medication Dose Route Frequency Provider Last Rate Last Admin   omalizumab  (XOLAIR ) injection 300 mg  300 mg Subcutaneous Q28 days Kozlow, Eric J, MD   300 mg at 09/02/23 1610     Review of Systems:   Constitutional:   No  weight loss, night sweats,  Fevers, chills, +fatigue, or  lassitude.  HEENT:   No headaches,  Difficulty swallowing,  Tooth/dental problems, or  Sore throat,  No sneezing, itching, ear ache, nasal congestion, post nasal drip,   CV:  No chest pain,  Orthopnea, PND, swelling in lower extremities, anasarca, dizziness, palpitations, syncope.   GI  No heartburn, indigestion, abdominal pain, nausea, vomiting, diarrhea, change in bowel habits, loss of appetite, bloody stools.   Resp: No shortness of breath with exertion or at rest.  No excess mucus, no productive cough,  No non-productive cough,  No coughing up of blood.  No change in color of mucus.  No wheezing.  No chest wall deformity  Skin: no rash or lesions.  GU: no dysuria, change in color of urine, no urgency or frequency.  No flank pain, no hematuria   MS:  No joint pain or swelling.  No decreased range of motion.  No back pain.    Physical Exam  BP 132/82 (BP Location: Left Arm, Cuff Size: Normal)   Pulse 80   Ht 5\' 3"  (1.6 m)   Wt 211 lb 12.8 oz (96.1 kg)   SpO2 96%   BMI 37.52 kg/m   GEN: A/Ox3; pleasant , NAD, well nourished    HEENT:  [place]/AT,   NOSE-clear, THROAT-clear, no lesions, no postnasal drip or exudate noted.   NECK:  Supple w/ fair ROM; no JVD; normal carotid impulses w/o bruits; no  thyromegaly or nodules palpated; no lymphadenopathy.    RESP  Clear  P & A; w/o, wheezes/ rales/ or rhonchi. no accessory muscle use, no dullness to percussion  CARD:  RRR, no m/r/g, no peripheral edema, pulses intact, no cyanosis or clubbing.  GI:   Soft & nt; nml bowel sounds; no organomegaly or masses detected.   Musco: Warm bil, no deformities or joint swelling noted.   Neuro: alert, no focal deficits noted.    Skin: Warm, no lesions or rashes    Lab Results:  CBC  No results found for: "PROBNP"  Imaging: No results found.  omalizumab  (XOLAIR ) injection 300 mg     Date Action Dose Route User   09/02/2023 [phone] Given 300 mg Subcutaneous (Other) Denton Flakes, [place]   9/60/4540 9811 Given 300 mg Subcutaneous (Other) Denton Flakes, LPN           No data to display          No results found for: "NITRICOXIDE"      Assessment & Plan:   Severe persistent asthma, uncomplicated Continue on current regimen and follow-up with asthma and allergy.  Pulmonary nodule Continue to follow with serial CT imaging.  Set up for CT chest in 1 month.  Chronic respiratory failure with hypoxia (HCC) COVID-pneumonia with acute respiratory failure during hospitalization January 2025.  Patient required oxygen at discharge.  Over the last several months patient has clinically improved.  Oxygen demands have decreased.  Walk test today in office shows no significant desaturations on room air.  May discontinue oxygen.   I spent 42    minutes dedicated to the care of this patient on the date of this encounter to include pre-visit review of records, face-to-face time with the patient discussing conditions above, post visit ordering of testing, clinical documentation with the electronic health record, making appropriate referrals as documented, and communicating necessary findings to members of the patients care team.    Roena Clark, NP 09/11/2023

## 2023-09-11 NOTE — Assessment & Plan Note (Signed)
Continue on current regimen and follow-up with asthma and allergy

## 2023-09-11 NOTE — Assessment & Plan Note (Signed)
 Snoring, restless sleep, daytime sleepiness, BMI 37-set up for home sleep study.  Patient education given on sleep apnea  - discussed how weight can impact sleep and risk for sleep disordered breathing - discussed options to assist with weight loss: combination of diet modification, cardiovascular and strength training exercises   - had an extensive discussion regarding the adverse health consequences related to untreated sleep disordered breathing - specifically discussed the risks for hypertension, coronary artery disease, cardiac dysrhythmias, cerebrovascular disease, and diabetes - lifestyle modification discussed   - discussed how sleep disruption can increase risk of accidents, particularly when driving - safe driving practices were discussed   Plan Patient Instructions  Set up for CT chest in 1 month  Set up for home sleep study  May discontinue Oxygen Activity as tolerated.  Follow up in 6 weeks to discuss results and treatment options.

## 2023-09-14 ENCOUNTER — Ambulatory Visit: Admitting: Podiatry

## 2023-09-16 ENCOUNTER — Other Ambulatory Visit: Payer: Self-pay | Admitting: Family Medicine

## 2023-09-16 ENCOUNTER — Other Ambulatory Visit: Payer: Self-pay

## 2023-09-16 ENCOUNTER — Encounter: Payer: Self-pay | Admitting: Allergy

## 2023-09-16 ENCOUNTER — Ambulatory Visit (INDEPENDENT_AMBULATORY_CARE_PROVIDER_SITE_OTHER): Payer: Medicare Other | Admitting: Allergy

## 2023-09-16 ENCOUNTER — Telehealth: Payer: Self-pay | Admitting: Allergy

## 2023-09-16 VITALS — BP 128/74 | HR 76 | Temp 98.0°F | Wt 211.9 lb

## 2023-09-16 DIAGNOSIS — J3089 Other allergic rhinitis: Secondary | ICD-10-CM | POA: Diagnosis not present

## 2023-09-16 DIAGNOSIS — K219 Gastro-esophageal reflux disease without esophagitis: Secondary | ICD-10-CM | POA: Diagnosis not present

## 2023-09-16 DIAGNOSIS — J4550 Severe persistent asthma, uncomplicated: Secondary | ICD-10-CM

## 2023-09-16 DIAGNOSIS — H539 Unspecified visual disturbance: Secondary | ICD-10-CM

## 2023-09-16 MED ORDER — BUDESONIDE-FORMOTEROL FUMARATE 160-4.5 MCG/ACT IN AERO: 2.0000 | INHALATION_SPRAY | Freq: Two times a day (BID) | RESPIRATORY_TRACT | 11 refills | Status: DC

## 2023-09-16 MED ORDER — SPIRIVA HANDIHALER 18 MCG IN CAPS: 18.0000 ug | ORAL_CAPSULE | Freq: Every day | RESPIRATORY_TRACT | 11 refills | Status: DC

## 2023-09-16 MED ORDER — EPINEPHRINE 0.3 MG/0.3ML IJ SOAJ: 0.3000 mg | INTRAMUSCULAR | 1 refills | Status: AC | PRN

## 2023-09-16 NOTE — Telephone Encounter (Signed)
 Refer to [facility] for chronic nasal congestion. Can't take steroid nasal sprays due to glaucoma issues.

## 2023-09-16 NOTE — Progress Notes (Signed)
 Follow Up Note  RE: Melanie Armstrong MRN: [id number] DOB: 1955/12/05 Date of Office Visit: 09/16/2023  Referring provider: Roselind Congo, MD Primary care provider: Roselind Congo, MD  Chief Complaint: Asthma, Allergic Rhinitis , and Follow-up (She reported, she had COVID and was hospitalized in January and also had eye surgery. She is doing well today. No concerns)  History of Present Illness: I had the pleasure of seeing Melanie Armstrong for a follow up visit at the [facility] on 09/16/2023. She is a 68 y.o. female, who is being followed for asthma on Xolair , allergic rhinitis, GERD. Her previous allergy office visit was on 03/23/2023 with Dr. Burdette Carolin. Today is a regular follow up visit.  Discussed the use of AI scribe software for clinical note transcription with the patient, who gave verbal consent to proceed.    She was hospitalized in January for COVID-19 pneumonia, requiring an 11-day stay due to hypoxemia. She was discharged with supplemental oxygen, which she no longer requires. Although she is not back to her full health, a chest X-ray showed healing areas, and a CT scan is scheduled. She has a history of nodules.  Her asthma has been stable without any flare-ups. She continues to use Symbicort  160mcg two puffs twice a day, Spiriva  1 capsule once a day, montelukast  daily, and Xolair  300mg  every four weeks. She has stopped allergy shots and reports increased nasal congestion since stopping. She carries an albuterol  rescue inhaler and an EpiPen  but rarely uses them.  She experiences persistent nasal congestion, particularly at night, which she attributes to stopping allergy shots. She uses azelastine  and saline sprays, and takes Allegra daily. She has not seen an ENT specialist in a long time and is considering a referral due to her symptoms.  Her eye condition requires her to use drops in her left eye. She has a prescription for new glasses but has not yet filled it,  preferring to consult her regular doctor first.  No frequent use of her rescue inhaler and no recent use of new medications post-hospitalization. Her nose stays 'stopped up' most of the time, especially when the weather changes.     Assessment and Plan: Melanie Armstrong is a 68 y.o. female with: Severe persistent asthma without complication Past history - follows with pulmonology for pulmonary nodules. Prefers Symbicort  + Spiriva  over [place].  Interim history - hospitalized for Covid-19 pneumonia. Doing better now.  Today's spirometry was normal - not as good as previous one.  Will try to switch Xolair  to Tezspire injections. Once the Xolair  is out of your system (4-6 months) will plan on retesting to see if you have any allergies. Daily controller medication(s): continue with Symbicort  160mcg 2 puffs twice a day with spacer and rinse mouth afterwards. Continue Spiriva  once a day. Continue montelukast  10mg  daily. Continue Xolair  300mg  every 4 weeks.  May use albuterol  rescue inhaler 2 puffs every 4 to 6 hours as needed for shortness of breath, chest tightness, coughing, and wheezing. May use albuterol  rescue inhaler 2 puffs 5 to 15 minutes prior to strenuous physical activities. Monitor frequency of use - if you need to use it more than twice per week on a consistent basis let us  know.  Get spirometry at next visit. Follow up with pulm regarding CT chest changes.   Other allergic rhinitis Past history - started AIT on W-M before 2016. No steroid nasal sprays due to glaucoma.  Interim history - stopped AIT in Dec 2024 and  noticed worsening nasal congestion. No recent testing. Continue montelukast  10mg  daily. Use azelastine  nasal spray 1-2 sprays per nostril twice a day as needed for runny nose/drainage. Don't use Afrin as you can get rebound nasal congestion. Nasal saline spray (i.e., Simply Saline) or nasal saline lavage (i.e., NeilMed) is recommended as needed and prior to medicated nasal  sprays. Use over the counter antihistamines such as Zyrtec (cetirizine), Claritin  (loratadine ), Allegra (fexofenadine), or Xyzal (levocetirizine) daily as needed. May switch antihistamines every few months. Refer to ENT for chronic nasal congestion. Plan on allergy testing once Xolair  out of her system.   Gastroesophageal reflux disease, unspecified whether esophagitis present Continue with omeprazole  20mg  in the morning.   Return in about 4 months (around 01/16/2024).  Meds ordered this encounter  Medications   EPINEPHrine  0.3 mg/0.3 mL IJ SOAJ injection    Sig: Inject 0.3 mg into the muscle as needed for anaphylaxis.    Dispense:  2 each    Refill:  1    May dispense generic/Mylan/Teva brand.   SPIRIVA  HANDIHALER 18 MCG inhalation capsule    Sig: Place 1 capsule (18 mcg total) into inhaler and inhale daily.    Dispense:  30 capsule    Refill:  11   budesonide -formoterol  (SYMBICORT ) 160-4.5 MCG/ACT inhaler    Sig: Inhale 2 puffs into the lungs in the morning and at bedtime. with spacer and rinse mouth afterwards.    Dispense:  1 each    Refill:  11   Lab Orders  No laboratory test(s) ordered today    Diagnostics: Spirometry:  Tracings reviewed. Her effort: Good reproducible efforts. FVC: 2.71L FEV1: 2.37L, 109% predicted FEV1/FVC ratio: 87% Interpretation: Spirometry consistent with normal pattern.  Please see scanned spirometry results for details.  Results discussed with patient/family.   Medication List:  Current Outpatient Medications  Medication Sig Dispense Refill   albuterol  (PROAIR  HFA) 108 (90 Base) MCG/ACT inhaler Inhale 2 puffs into the lungs every 4 (four) hours as needed. 18 g 1   albuterol  (PROVENTIL ) (2.5 MG/3ML) 0.083% nebulizer solution USE 1 VIAL IN NEBULIZER EVERY 4 HOURS AS NEEDED FOR WHEEZING OR  SHORTNESS  OF  BREATH 600 mL 0   azelastine  (ASTELIN ) 0.1 % nasal spray 1 spray as needed for rhinitis or allergies.     betaxolol  (BETOPTIC -S) 0.5 %  ophthalmic suspension Place 1 drop into both eyes 2 (two) times daily.     calcium-vitamin D (OSCAL WITH D) 500-200 MG-UNIT per tablet Take 1 tablet by mouth daily with breakfast.     diazepam  (VALIUM ) 5 MG tablet Take 5 mg by mouth every 6 (six) hours as needed for anxiety.     dorzolamide  (TRUSOPT ) 2 % ophthalmic solution Place 1 drop into both eyes 2 (two) times daily.     EPINEPHrine  0.3 mg/0.3 mL IJ SOAJ injection Inject 0.3 mg into the muscle as needed for anaphylaxis. 2 each 1   Ferrous Sulfate  (IRON) 325 (65 Fe) MG TABS Take 1 tablet by mouth daily.     fexofenadine (ALLEGRA) 180 MG tablet Take 180 mg by mouth daily.     furosemide  (LASIX ) 20 MG tablet Take 40 mg by mouth every morning.     glucosamine-chondroitin 500-400 MG tablet Take 1 tablet by mouth 2 (two) times daily.     latanoprost  (XALATAN ) 0.005 % ophthalmic solution Place 1 drop into both eyes at bedtime.     lisinopril  (ZESTRIL ) 20 MG tablet Take 40 mg by mouth daily.  montelukast  (SINGULAIR ) 10 MG tablet Take 1 tablet (10 mg total) by mouth at bedtime. 30 tablet 5   Multiple Vitamin (MULTIVITAMIN WITH MINERALS) TABS tablet Take 1 tablet by mouth daily.     NEOMYCIN -POLYMYXIN-HYDROCORTISONE (CORTISPORIN) 1 % SOLN OTIC solution Apply 1-2 drops to toe BID after soaking 10 mL 1   Omega-3 Fatty Acids (OMEGA 3 PO) Take 1 capsule by mouth daily.     omeprazole  (PRILOSEC) 20 MG capsule Take 1 capsule (20 mg total) by mouth daily. 30 capsule 5   PRESCRIPTION MEDICATION once a week.     vitamin C  (ASCORBIC ACID ) 500 MG tablet Take 500 mg by mouth daily.     acetaZOLAMIDE (DIAMOX) 250 MG tablet Take 250-500 mg by mouth daily. (Patient not taking: Reported on 09/16/2023)     budesonide -formoterol  (SYMBICORT ) 160-4.5 MCG/ACT inhaler Inhale 2 puffs into the lungs in the morning and at bedtime. with spacer and rinse mouth afterwards. 1 each 11   SPIRIVA  HANDIHALER 18 MCG inhalation capsule Place 1 capsule (18 mcg total) into inhaler and  inhale daily. 30 capsule 11   Current Facility-Administered Medications  Medication Dose Route Frequency Provider Last Rate Last Admin   omalizumab  (XOLAIR ) injection 300 mg  300 mg Subcutaneous Q28 days Kozlow, Eric J, MD   300 mg at 09/02/23 1610   Allergies: Allergies  Allergen Reactions   Advair Diskus [Fluticasone -Salmeterol]     Per patient, she thinks she developed walking PNA.    Avelox [Moxifloxacin Hcl In Nacl] Other (See Comments)    Muscle Aches   Ciprofloxacin Hives   Irbesartan Other (See Comments)   Levofloxacin Other (See Comments)    Tendon pain   Olmesartan Other (See Comments)   Other Other (See Comments)   Protonix [Pantoprazole Sodium] Diarrhea   Trimethoprim Hives   Sulfamethoxazole-Trimethoprim Rash   I reviewed her past medical history, social history, family history, and environmental history and no significant changes have been reported from her previous visit.  Review of Systems  Constitutional:  Negative for appetite change, chills, fever and unexpected weight change.  HENT:  Positive for congestion. Negative for rhinorrhea.   Eyes:  Negative for itching.  Respiratory:  Negative for cough, chest tightness, shortness of breath and wheezing.   Gastrointestinal:  Negative for abdominal pain.  Skin:  Negative for rash.  Allergic/Immunologic: Positive for environmental allergies.  Neurological:  Negative for headaches.    Objective: BP 128/74 (BP Location: Left Arm, Patient Position: Sitting, Cuff Size: Normal)   Pulse 76   Temp 98 F (36.7 C) (Temporal)   Wt 211 lb 14.4 oz (96.1 kg)   SpO2 96%   BMI 37.54 kg/m  Body mass index is 37.54 kg/m. Physical Exam Vitals and nursing note reviewed.  Constitutional:      Appearance: Normal appearance. She is well-developed.  HENT:     Head: Normocephalic and atraumatic.     Right Ear: Tympanic membrane and external ear normal.     Left Ear: Tympanic membrane and external ear normal.     Nose: Nose  normal. No congestion or rhinorrhea.     Mouth/Throat:     Mouth: Mucous membranes are moist.     Pharynx: Oropharynx is clear.  Eyes:     Conjunctiva/sclera: Conjunctivae normal.  Cardiovascular:     Rate and Rhythm: Normal rate and regular rhythm.     Heart sounds: Normal heart sounds. No murmur heard. Pulmonary:     Effort: Pulmonary effort is normal.  Breath sounds: Normal breath sounds. No wheezing, rhonchi or rales.  Musculoskeletal:     Cervical back: Neck supple.  Skin:    General: Skin is warm.     Findings: No rash.  Neurological:     Mental Status: She is alert and oriented to person, place, and time.  Psychiatric:        Behavior: Behavior normal.   Previous notes and tests were reviewed. The plan was reviewed with the patient/family, and all questions/concerned were addressed.  It was my pleasure to see Melanie Armstrong today and participate in her care. Please feel free to contact me with any questions or concerns.  Sincerely,  Eudelia Hero, DO Allergy & Immunology  [facility]  [facility] office: [phone] [facility] office: [phone]

## 2023-09-16 NOTE — Patient Instructions (Addendum)
 Asthma: Will try to switch Xolair  to Tezspire injections. Once the Xolair  is out of your system (4-6 months) will plan on retesting to see if you have any allergies.  Daily controller medication(s): continue with Symbicort  160mcg 2 puffs twice a day with spacer and rinse mouth afterwards. Continue Spiriva  once a day. Continue montelukast  10mg  daily. Continue Xolair  300mg  every 4 weeks.  May use albuterol  rescue inhaler 2 puffs every 4 to 6 hours as needed for shortness of breath, chest tightness, coughing, and wheezing. May use albuterol  rescue inhaler 2 puffs 5 to 15 minutes prior to strenuous physical activities. Monitor frequency of use - if you need to use it more than twice per week on a consistent basis let us  know.  Asthma control goals:  Full participation in all desired activities (may need albuterol  before activity) Albuterol  use two times or less a week on average (not counting use with activity) Cough interfering with sleep two times or less a month Oral steroids no more than once a year No hospitalizations  Allergic rhinitis Continue montelukast  10mg  daily. Use azelastine  nasal spray 1-2 sprays per nostril twice a day as needed for runny nose/drainage. Don't use Afrin as you can get rebound nasal congestion. Nasal saline spray (i.e., Simply Saline) or nasal saline lavage (i.e., NeilMed) is recommended as needed and prior to medicated nasal sprays. Use over the counter antihistamines such as Zyrtec (cetirizine), Claritin  (loratadine ), Allegra (fexofenadine), or Xyzal (levocetirizine) daily as needed. May switch antihistamines every few months. Refer to ENT for chronic nasal congestion.  Reflux Continue with omeprazole  20mg  in the morning.  Follow up in 4 months or sooner if needed.  Make sure you get flu shot in the fall. Will talk about the Covid booster at the next visit.

## 2023-09-17 ENCOUNTER — Telehealth: Payer: Self-pay | Admitting: *Deleted

## 2023-09-17 ENCOUNTER — Other Ambulatory Visit: Payer: Self-pay | Admitting: Family Medicine

## 2023-09-17 DIAGNOSIS — R410 Disorientation, unspecified: Secondary | ICD-10-CM

## 2023-09-17 NOTE — Telephone Encounter (Signed)
-----   Message from Trudy Fusi sent at 09/16/2023 12:21 PM EDT ----- Can we try to switch her to Tezspire for asthma? She's been on Xolair  which helps but I need her to come off Xolair  for 4-6 months so I can do some allergy testing on her. Thank you.

## 2023-09-17 NOTE — Telephone Encounter (Signed)
 Called patient and advised change to Tezspire. She will start in 2 weeks when she is due for Xolair . She is MCR/Supplement so will be buy an bill and will order same.

## 2023-09-18 ENCOUNTER — Ambulatory Visit
Admission: RE | Admit: 2023-09-18 | Discharge: 2023-09-18 | Disposition: A | Discharge status: Home / Self Care | Source: Ambulatory Visit | Attending: Family Medicine | Admitting: Family Medicine

## 2023-09-18 DIAGNOSIS — H539 Unspecified visual disturbance: Secondary | ICD-10-CM

## 2023-09-26 ENCOUNTER — Ambulatory Visit
Admission: RE | Admit: 2023-09-26 | Discharge: 2023-09-26 | Disposition: A | Discharge status: Home / Self Care | Source: Ambulatory Visit | Attending: Family Medicine | Admitting: Family Medicine

## 2023-09-26 DIAGNOSIS — R410 Disorientation, unspecified: Secondary | ICD-10-CM

## 2023-09-28 ENCOUNTER — Ambulatory Visit (INDEPENDENT_AMBULATORY_CARE_PROVIDER_SITE_OTHER): Admitting: Podiatry

## 2023-09-28 ENCOUNTER — Encounter: Payer: Self-pay | Admitting: Podiatry

## 2023-09-28 DIAGNOSIS — Z9889 Other specified postprocedural states: Secondary | ICD-10-CM

## 2023-09-28 DIAGNOSIS — L600 Ingrowing nail: Secondary | ICD-10-CM

## 2023-09-28 NOTE — Progress Notes (Signed)
 She presents today for follow-up of her nail check.  States that is doing just great no problems whatsoever she refers to the fibular border of the hallux right.  Objective: Vital signs are stable alert oriented x 3 there is no erythema edema cellulitis drainage or odor.  The margin appears to be healing very nicely.  Assessment: Well-healing surgical toe fibular border hallux right.  Plan: Follow-up with me on an as-needed basis.

## 2023-09-29 ENCOUNTER — Ambulatory Visit

## 2023-09-29 ENCOUNTER — Encounter: Payer: Self-pay | Admitting: Neurology

## 2023-09-29 ENCOUNTER — Encounter (INDEPENDENT_AMBULATORY_CARE_PROVIDER_SITE_OTHER): Payer: Self-pay

## 2023-09-30 ENCOUNTER — Ambulatory Visit

## 2023-10-07 ENCOUNTER — Ambulatory Visit (INDEPENDENT_AMBULATORY_CARE_PROVIDER_SITE_OTHER)

## 2023-10-07 DIAGNOSIS — J4550 Severe persistent asthma, uncomplicated: Secondary | ICD-10-CM

## 2023-10-07 MED ORDER — TEZEPELUMAB-EKKO 210 MG/1.91ML ~~LOC~~ SOSY
210.0000 mg | PREFILLED_SYRINGE | SUBCUTANEOUS | Status: AC
  Administered 2023-10-07 – 2024-04-29 (×8): 210 mg via SUBCUTANEOUS

## 2023-10-07 NOTE — Progress Notes (Signed)
 Immunotherapy   Patient Details  Name: Melanie Armstrong MRN: [id number] Date of Birth: [birth year]  10/07/2023  Melanie Armstrong started injections for asthma. Patient received 210 mg Tezspire and waited 15 minutes with no problems. Frequency: every 28 days Consent signed and patient instructions given.   Melanie Armstrong 10/07/2023, 1:17 PM

## 2023-10-09 NOTE — Progress Notes (Unsigned)
 NEUROLOGY CONSULTATION NOTE  Melanie Armstrong MRN: [id number] DOB: [birth year]  Referring provider: Elsie JONELLE Lesches, MD Primary care provider: Elsie JONELLE Lesches, MD  Reason for consult:  TIA  Assessment/Plan:   Transient ischemic attack x 2 First episode of transient left sided numbness may have been right thalamic TIA (not corresponding to chronic left thalamic infarct on MRI) Second episode of monocular polyplopia followed by expressive aphasia - less clear but still may be TIA (patient denies history of migraines) Hypertension   Continue stroke workup: Complete 2D echo 2 week Zio patch cardiac monitoring Check fasting lipid panel Secondary stroke prevention: ASA 81mg  daily LDL goal less than 70 Hgb J8r goal less than 7 Normotensive blood pressure. Mediterranean diet Follow up 6 months.   Subjective:  Melanie Armstrong is a 68 year old right-handed female with HTN, prediabetes, asthma and osteoarthritis who presents for possible TIA.  History supplemented by referring provider's note and her accompanying daughter.  MRI of brain personally reviewed.   On 07/28/2023 had 2 stents implanted in her right eyes due to increased IOP from her glaucoma.  She was off of ASA for a week but restarted that night.  The following day, she developed left sided numbness involving face, arm and leg lasting 10-15 minutes.  No associated facial droop or unilateral weakness.  No associated headache or visual changes.  Since then, she was restarted on her anti-hypertensive medications as well.  On 09/05/2022, she woke up from sleep to go to the bathroom and noticed that she noticed images in her vision of her right eye were stacked (several of them).  She closed her left eye and vision was fine.  However, she denied vision loss, eye pain, or headache.  She went back to sleep.  When she woke up about 90 minutes later, her vision was still altered and she was confused.  When she was asked the name of her  dog, she kept saying her previous dog's name.  She was unable to correctly say her children's birthdays (date was correct but years were wrong).  Denied slurred speech, facial droop or unilateral numbness or weakness.  She felt tired and kept yawning and went back to sleep.  She woke up about 40 minutes later and all symptoms were resolved.  She checked her blood pressure at that time and it was in the 130s/80s.     Carotid ultrasound on 09/18/2023 revealed heterogenous atherosclerotic plaque in the proximal right ICA causing mild (1-49%) stenosis but no plaque or evidence of stenosis in the left ICA.  MRI of brain without contrast on 09/26/2023 revealed mild chronic small vessel ischemic changes in the cerebral white matter with small chronic left thalamic infarct but no acute or subacute abnormalities.    In January 2025, she had COVID, hospitalized for 11 days.    09/04/2023 Hgb A1c 6  Denies palpitations. Denies history of migraines     PAST MEDICAL HISTORY: Past Medical History:  Diagnosis Date   Arthritis    Asthma    Chronic dermatitis 10/19/2019   GERD (gastroesophageal reflux disease)    Heart murmur    as a child    Hypertension     PAST SURGICAL HISTORY: Past Surgical History:  Procedure Laterality Date   CHOLECYSTECTOMY     TONSILLECTOMY     TOTAL HIP ARTHROPLASTY Right 08/22/2014   Procedure: RIGHT TOTAL HIP ARTHROPLASTY ANTERIOR APPROACH;  Surgeon: Donnice Car, MD;  Location: [facility];  Service: Orthopedics;  Laterality: Right;   TOTAL HIP ARTHROPLASTY Left 01/2019    MEDICATIONS: Current Outpatient Medications on File Prior to Visit  Medication Sig Dispense Refill   acetaZOLAMIDE (DIAMOX) 250 MG tablet Take 250-500 mg by mouth daily. (Patient not taking: Reported on 09/16/2023)     albuterol  (PROAIR  HFA) 108 (90 Base) MCG/ACT inhaler Inhale 2 puffs into the lungs every 4 (four) hours as needed. 18 g 1   albuterol  (PROVENTIL ) (2.5 MG/3ML) 0.083% nebulizer solution USE  1 VIAL IN NEBULIZER EVERY 4 HOURS AS NEEDED FOR WHEEZING OR  SHORTNESS  OF  BREATH 600 mL 0   azelastine  (ASTELIN ) 0.1 % nasal spray 1 spray as needed for rhinitis or allergies.     betaxolol  (BETOPTIC -S) 0.5 % ophthalmic suspension Place 1 drop into both eyes 2 (two) times daily.     budesonide -formoterol  (SYMBICORT ) 160-4.5 MCG/ACT inhaler Inhale 2 puffs into the lungs in the morning and at bedtime. with spacer and rinse mouth afterwards. 1 each 11   calcium-vitamin D (OSCAL WITH D) 500-200 MG-UNIT per tablet Take 1 tablet by mouth daily with breakfast.     diazepam  (VALIUM ) 5 MG tablet Take 5 mg by mouth every 6 (six) hours as needed for anxiety.     dorzolamide  (TRUSOPT ) 2 % ophthalmic solution Place 1 drop into both eyes 2 (two) times daily.     EPINEPHrine  0.3 mg/0.3 mL IJ SOAJ injection Inject 0.3 mg into the muscle as needed for anaphylaxis. 2 each 1   Ferrous Sulfate  (IRON) 325 (65 Fe) MG TABS Take 1 tablet by mouth daily.     fexofenadine (ALLEGRA) 180 MG tablet Take 180 mg by mouth daily.     furosemide  (LASIX ) 20 MG tablet Take 40 mg by mouth every morning.     glucosamine-chondroitin 500-400 MG tablet Take 1 tablet by mouth 2 (two) times daily.     latanoprost  (XALATAN ) 0.005 % ophthalmic solution Place 1 drop into both eyes at bedtime.     lisinopril  (ZESTRIL ) 20 MG tablet Take 40 mg by mouth daily.     montelukast  (SINGULAIR ) 10 MG tablet Take 1 tablet (10 mg total) by mouth at bedtime. 30 tablet 5   Multiple Vitamin (MULTIVITAMIN WITH MINERALS) TABS tablet Take 1 tablet by mouth daily.     NEOMYCIN -POLYMYXIN-HYDROCORTISONE (CORTISPORIN) 1 % SOLN OTIC solution Apply 1-2 drops to toe BID after soaking 10 mL 1   Omega-3 Fatty Acids (OMEGA 3 PO) Take 1 capsule by mouth daily.     omeprazole  (PRILOSEC) 20 MG capsule Take 1 capsule (20 mg total) by mouth daily. 30 capsule 5   PRESCRIPTION MEDICATION once a week.     SPIRIVA  HANDIHALER 18 MCG inhalation capsule Place 1 capsule (18 mcg  total) into inhaler and inhale daily. 30 capsule 11   vitamin C  (ASCORBIC ACID ) 500 MG tablet Take 500 mg by mouth daily.     Current Facility-Administered Medications on File Prior to Visit  Medication Dose Route Frequency Provider Last Rate Last Admin   omalizumab  (XOLAIR ) injection 300 mg  300 mg Subcutaneous Q28 days Kozlow, Eric J, MD   300 mg at 09/02/23 9161   tezepelumab -ekko (TEZSPIRE ) 210 MG/1. syringe 210 mg  210 mg Subcutaneous Q28 days Luke Orlan HERO, DO   210 mg at 10/07/23 1317    ALLERGIES: Allergies  Allergen Reactions   Advair Diskus [Fluticasone -Salmeterol]     Per patient, she thinks she developed walking PNA.    Avelox [Moxifloxacin Hcl In Nacl] Other (See  Comments)    Muscle Aches   Ciprofloxacin Hives   Irbesartan Other (See Comments)   Levofloxacin Other (See Comments)    Tendon pain   Olmesartan Other (See Comments)   Other Other (See Comments)   Protonix [Pantoprazole Sodium] Diarrhea   Trimethoprim Hives   Sulfamethoxazole-Trimethoprim Rash    FAMILY HISTORY: Family History  Problem Relation Age of Onset   Heart disease Father    Allergic rhinitis Neg Hx    Angioedema Neg Hx    Asthma Neg Hx    Eczema Neg Hx    Immunodeficiency Neg Hx    Urticaria Neg Hx     Objective:  Blood pressure (!) 145/84, pulse 80, weight 212 lb (96.2 kg), SpO2 96%. General: No acute distress.  Patient appears well-groomed.   Head:  Normocephalic/atraumatic Eyes:  fundi examined but not visualized Neck: supple, no paraspinal tenderness, full range of motion Heart: regular rate and rhythm Neurological Exam: Mental status: alert and oriented to person, place, and time, speech fluent and not dysarthric, language intact. Cranial nerves: CN I: not tested CN II: pupils equal, round and reactive to light, visual fields intact CN III, IV, VI:  full range of motion, no nystagmus, no ptosis CN V: facial sensation intact. CN VII: upper and lower face symmetric CN VIII:  hearing intact CN IX, X: gag intact, uvula midline CN XI: sternocleidomastoid and trapezius muscles intact CN XII: tongue midline Bulk & Tone: normal, no fasciculations. Motor:  muscle strength 5/5 throughout Sensation:  Pinprick and vibratory sensation intact. Deep Tendon Reflexes:  2+ throughout,  toes downgoing.   Finger to nose testing:  Without dysmetria.   Gait:  Normal station and stride.  Romberg negative.    Thank you for allowing me to take part in the care of this patient.  Juliene Dunnings, DO  CC: Elsie Lesches, MD

## 2023-10-12 ENCOUNTER — Ambulatory Visit (INDEPENDENT_AMBULATORY_CARE_PROVIDER_SITE_OTHER): Admitting: Neurology

## 2023-10-12 ENCOUNTER — Encounter: Payer: Self-pay | Admitting: Neurology

## 2023-10-12 ENCOUNTER — Ambulatory Visit (HOSPITAL_COMMUNITY)

## 2023-10-12 VITALS — BP 145/84 | HR 80 | Wt 212.0 lb

## 2023-10-12 DIAGNOSIS — I10 Essential (primary) hypertension: Secondary | ICD-10-CM

## 2023-10-12 DIAGNOSIS — G459 Transient cerebral ischemic attack, unspecified: Secondary | ICD-10-CM

## 2023-10-12 NOTE — Patient Instructions (Signed)
 Check fasting lipid panel Check echocardiogram Check 2 week cardiac event monitor/Zio patch Restart aspirin  81mg  daily after procedure. Mediterranean diet (see below) Follow up 6 months.

## 2023-10-13 ENCOUNTER — Ambulatory Visit

## 2023-10-13 ENCOUNTER — Telehealth: Payer: Self-pay

## 2023-10-13 ENCOUNTER — Ambulatory Visit (HOSPITAL_COMMUNITY)
Admission: RE | Admit: 2023-10-13 | Discharge: 2023-10-13 | Disposition: A | Discharge status: Home / Self Care | Source: Ambulatory Visit | Attending: Adult Health | Admitting: Adult Health

## 2023-10-13 ENCOUNTER — Other Ambulatory Visit

## 2023-10-13 DIAGNOSIS — J4550 Severe persistent asthma, uncomplicated: Secondary | ICD-10-CM | POA: Diagnosis present

## 2023-10-13 DIAGNOSIS — Z8709 Personal history of other diseases of the respiratory system: Secondary | ICD-10-CM | POA: Insufficient documentation

## 2023-10-13 DIAGNOSIS — G459 Transient cerebral ischemic attack, unspecified: Secondary | ICD-10-CM

## 2023-10-13 DIAGNOSIS — R911 Solitary pulmonary nodule: Secondary | ICD-10-CM | POA: Insufficient documentation

## 2023-10-13 LAB — LIPID PANEL
Cholesterol: 200 mg/dL — ABNORMAL HIGH (ref <200)
HDL: 71 mg/dL (ref >=50)
LDL Cholesterol (Calc): 109 mg/dL — ABNORMAL HIGH
Non-HDL Cholesterol (Calc): 129 mg/dL (ref <130)
Total CHOL/HDL Ratio: 2.8 (calc) (ref <5.0)
Triglycerides: 106 mg/dL (ref <150)

## 2023-10-13 NOTE — Telephone Encounter (Signed)
 done

## 2023-10-13 NOTE — Progress Notes (Unsigned)
Enrolled patient for a 14 day Zio AT monitor to be mailed to patients home  DOD to read

## 2023-10-14 NOTE — Telephone Encounter (Signed)
 Faxed back to [facility] eye care.

## 2023-10-15 ENCOUNTER — Ambulatory Visit: Payer: Self-pay | Admitting: Neurology

## 2023-10-15 DIAGNOSIS — I10 Essential (primary) hypertension: Secondary | ICD-10-CM

## 2023-10-19 ENCOUNTER — Ambulatory Visit (HOSPITAL_COMMUNITY)

## 2023-10-19 MED ORDER — ATORVASTATIN CALCIUM 40 MG PO TABS: 40.0000 mg | ORAL_TABLET | Freq: Every day | ORAL | 5 refills | Status: DC

## 2023-10-19 NOTE — Telephone Encounter (Addendum)
-----   Message from Juliene Lamar Dunnings sent at 10/15/2023 12:09 PM EDT ----- Cholesterol is higher than we would like.  Would like to start a cholesterol lower medication.  If she is agreeable, please send in prescription for ATORVASTATIN  40MG  DAILY.  QTY 30, REFILL 5.  I  would like to check a fasting lipid panel and hepatic function test in 3 months.   ----- Message ----- From: Interface, [facility] Lab Results In Sent: 10/13/2023  11:42 PM EDT To: Juliene JONELLE Dunnings, DO    Patient advised. Order for labs added.

## 2023-10-19 NOTE — Telephone Encounter (Signed)
 Pt is calling back about her blood work  please call

## 2023-10-20 ENCOUNTER — Ambulatory Visit: Payer: Self-pay | Admitting: Adult Health

## 2023-10-30 NOTE — Telephone Encounter (Signed)
 Melanie Armstrong has been scheduled for 12-24-23 at 9:30 am with Dr. Patel-ENT

## 2023-11-04 ENCOUNTER — Ambulatory Visit

## 2023-11-04 DIAGNOSIS — J4550 Severe persistent asthma, uncomplicated: Secondary | ICD-10-CM | POA: Diagnosis not present

## 2023-11-26 DIAGNOSIS — G459 Transient cerebral ischemic attack, unspecified: Secondary | ICD-10-CM | POA: Diagnosis not present

## 2023-12-02 ENCOUNTER — Ambulatory Visit

## 2023-12-02 DIAGNOSIS — J4550 Severe persistent asthma, uncomplicated: Secondary | ICD-10-CM

## 2023-12-08 ENCOUNTER — Ambulatory Visit (INDEPENDENT_AMBULATORY_CARE_PROVIDER_SITE_OTHER): Admitting: Adult Health

## 2023-12-08 VITALS — BP 140/80 | Temp 98.1°F | Ht 62.0 in | Wt 215.6 lb

## 2023-12-08 DIAGNOSIS — J1282 Pneumonia due to coronavirus disease 2019: Secondary | ICD-10-CM

## 2023-12-08 DIAGNOSIS — J4550 Severe persistent asthma, uncomplicated: Secondary | ICD-10-CM

## 2023-12-08 DIAGNOSIS — U071 COVID-19: Secondary | ICD-10-CM

## 2023-12-08 DIAGNOSIS — R0683 Snoring: Secondary | ICD-10-CM

## 2023-12-08 DIAGNOSIS — R911 Solitary pulmonary nodule: Secondary | ICD-10-CM

## 2023-12-08 NOTE — Progress Notes (Unsigned)
 @Patient  ID: Melanie Armstrong, female    DOB: Sep 29, 1955, 68 y.o.   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  Indeterminate 9 mm pulmonary nodule in posterior left lower lobe.   CT chest 04/2023 neg for PE, Bilateral aspdz, multifocal pneumonia , LLL harmartoma.  12/08/2023 Follow up : Lung nodules, O2 RF, Asthma  Discussed the use of AI scribe software for clinical note transcription with the patient, who gave verbal consent to proceed.  History of Present Illness Melanie Armstrong is a 68 year old female who presents for a three-month follow-up for asthma management. She was referred by a previous provider to Dr. Skeet at [facility] for further evaluation of her stroke.  Her asthma is managed with Symbicort  twice daily, Allegra, Singulair , Spiriva  inhaler, and Tezspire  injections every four weeks.  She has a history of lung nodules, which have been stable on CT scans. She had COVID-19 in January, resulting in pneumonia and hospitalization. The pneumonia has resolved, but there is some residual pleural thickening and a stable harmatoma in the lungs. She is a never smoker.  An MRI at [facility] revealed a stroke, though the timing is unknown. She was started on a statin and had blood drawn at the beginning of October for follow-up.  She underwent eye surgery on July 3rd for glaucoma in her right eye, performed by Dr. Eyvonne. Prior to surgery, she experienced high intraocular pressure of 40 mmHg and decreased vision, which worsened the day before surgery.  Post-surgery, her intraocular pressure has decreased, and her vision is improving, though she requires new glasses. She wears her current glasses primarily for protection.  She is currently on lisinopril  for blood pressure management, having previously tried irbesartan, which was not well tolerated.  She has not yet undergone a sleep study, despite experiencing snoring and daytime sleepiness, which may suggest sleep apnea.  She requires a knee replacement but is waiting until her eye condition stabilizes before proceeding with surgery.  She has not received a COVID-19 vaccine recently but plans to get it between mid-September and early October. She regularly receives the flu vaccine and has had the pneumonia vaccine in 2023.    Allergies  Allergen Reactions   Advair Diskus [Fluticasone -Salmeterol]     Per patient, she thinks she developed walking PNA.    Avelox [Moxifloxacin Hcl In Nacl] Other (See Comments)    Muscle Aches   Ciprofloxacin Hives   Irbesartan Other (See Comments)   Levofloxacin Other (See Comments)    Tendon pain   Olmesartan Other (See Comments)   Other Other (See Comments)   Protonix [Pantoprazole Sodium] Diarrhea   Trimethoprim Hives   Sulfamethoxazole-Trimethoprim Rash    Immunization History  Administered Date(s) Administered   Fluzone Influenza virus vaccine,trivalent (IIV3), split virus 12/13/2018   Influenza,inj,Quad PF,6+ Mos 01/26/2018, 12/13/2018   Influenza-Unspecified 01/13/2012, 12/16/2019   PFIZER(Purple Top)SARS-COV-2 Vaccination 07/07/2019, 07/22/2019, 02/03/2020, 07/20/2020   PNEUMOCOCCAL CONJUGATE-20 08/27/2021   Pfizer Covid-19 Vaccine Bivalent Booster 18yrs & up 01/25/2021   Pneumococcal Conjugate-13 04/14/2016   Pneumococcal Polysaccharide-23 04/14/1994  Respiratory Syncytial Virus Vaccine,Recomb Aduvanted(Arexvy) 05/14/2022   Td 12/23/2019   Tdap 12/23/2006, 11/12/2009   Zoster, Live 01/25/2016    Past Medical History:  Diagnosis  Date   Arthritis    Asthma    Chronic dermatitis 10/19/2019   GERD (gastroesophageal reflux disease)    Heart murmur    as a child    Hypertension     Tobacco History: Social History   Tobacco Use  Smoking Status Never  Smokeless Tobacco Never   Counseling given: Not Answered   Outpatient Medications Prior to Visit  Medication Sig Dispense Refill   albuterol  (PROAIR  HFA) 108 (90 Base) MCG/ACT inhaler Inhale 2 puffs into the lungs every 4 (four) hours as needed. 18 g 1   albuterol  (PROVENTIL ) (2.5 MG/3ML) 0.083% nebulizer solution USE 1 VIAL IN NEBULIZER EVERY 4 HOURS AS NEEDED FOR WHEEZING OR  SHORTNESS  OF  BREATH 600 mL 0   atorvastatin  (LIPITOR) 40 MG tablet Take 1 tablet (40 mg total) by mouth daily. 30 tablet 5   azelastine  (ASTELIN ) 0.1 % nasal spray 1 spray as needed for rhinitis or allergies.     betaxolol  (BETOPTIC -S) 0.5 % ophthalmic suspension Place 1 drop into both eyes 2 (two) times daily.     budesonide -formoterol  (SYMBICORT ) 160-4.5 MCG/ACT inhaler Inhale 2 puffs into the lungs in the morning and at bedtime. with spacer and rinse mouth afterwards. 1 each 11   calcium -vitamin D (OSCAL WITH D) 500-200 MG-UNIT per tablet Take 1 tablet by mouth daily with breakfast.     diazepam  (VALIUM ) 5 MG tablet Take 5 mg by mouth every 6 (six) hours as needed for anxiety.     dorzolamide  (TRUSOPT ) 2 % ophthalmic solution Place 1 drop into both eyes 2 (two) times daily.     EPINEPHrine  0.3 mg/0.3 mL IJ SOAJ injection Inject 0.3 mg into the muscle as needed for anaphylaxis. 2 each 1   Ferrous Sulfate  (IRON) 325 (65 Fe) MG TABS Take 1 tablet by mouth daily.     fexofenadine (ALLEGRA) 180 MG tablet Take 180 mg by mouth daily.     furosemide  (LASIX ) 20 MG tablet Take 40 mg by mouth every morning.     glucosamine-chondroitin 500-400 MG tablet Take 1 tablet by mouth 2 (two) times daily.     latanoprost  (XALATAN ) 0.005 % ophthalmic solution Place 1 drop into both eyes at bedtime.      lisinopril  (ZESTRIL ) 20 MG tablet Take 40 mg by mouth daily.     montelukast  (SINGULAIR ) 10 MG tablet Take 1 tablet (10 mg total) by mouth at bedtime. 30 tablet 5   Multiple Vitamin (MULTIVITAMIN WITH MINERALS) TABS tablet Take 1 tablet by mouth daily.     Omega-3 Fatty Acids (OMEGA 3 PO) Take 1 capsule by mouth daily.     omeprazole  (PRILOSEC) 20 MG capsule Take 1 capsule (20 mg total) by mouth daily. 30 capsule 5   SPIRIVA  HANDIHALER 18 MCG inhalation capsule Place 1 capsule (18 mcg total) into inhaler and inhale daily. 30 capsule 11   vitamin C  (ASCORBIC ACID ) 500 MG tablet Take 500 mg by mouth daily.     NEOMYCIN -POLYMYXIN-HYDROCORTISONE (CORTISPORIN) 1 % SOLN OTIC solution Apply 1-2 drops to toe BID after soaking (Patient not taking: Reported on 12/08/2023) 10 mL 1   Facility-Administered Medications Prior to Visit  Medication Dose Route Frequency Provider Last Rate Last Admin   tezepelumab -ekko (TEZSPIRE ) 210 MG/1. syringe 210 mg  210 mg Subcutaneous Q28 days Luke Orlan HERO,  DO   210 mg at 12/02/23 1027     Review of Systems:   Constitutional:   No  weight loss, night sweats,  Fevers, chills, fatigue, or  lassitude.  HEENT:   No headaches,  Difficulty swallowing,  Tooth/dental problems, or  Sore throat,                No sneezing, itching, ear ache, nasal congestion, post nasal drip,   CV:  No chest pain,  Orthopnea, PND, swelling in lower extremities, anasarca, dizziness, palpitations, syncope.   GI  No heartburn, indigestion, abdominal pain, nausea, vomiting, diarrhea, change in bowel habits, loss of appetite, bloody stools.   Resp: No shortness of breath with exertion or at rest.  No excess mucus, no productive cough,  No non-productive cough,  No coughing up of blood.  No change in color of mucus.  No wheezing.  No chest wall deformity  Skin: no rash or lesions.  GU: no dysuria, change in color of urine, no urgency or frequency.  No flank pain, no hematuria   MS:  No joint  pain or swelling.  No decreased range of motion.  No back pain.    Physical Exam  BP (!) 140/80   Temp 98.1 F (36.7 C) (Oral)   Ht 5' 2 (1.575 m)   Wt 215 lb 9.6 oz (97.8 kg)   SpO2 99% Comment: RA  BMI 39.43 kg/m   GEN: A/Ox3; pleasant , NAD, well nourished    HEENT:  [place]/AT,  EACs-clear, TMs-wnl, NOSE-clear, THROAT-clear, no lesions, no postnasal drip or exudate noted.   NECK:  Supple w/ fair ROM; no JVD; normal carotid impulses w/o bruits; no thyromegaly or nodules palpated; no lymphadenopathy.    RESP  Clear  P & A; w/o, wheezes/ rales/ or rhonchi. no accessory muscle use, no dullness to percussion  CARD:  RRR, no m/r/g, no peripheral edema, pulses intact, no cyanosis or clubbing.  GI:   Soft & nt; nml bowel sounds; no organomegaly or masses detected.   Musco: Warm bil, no deformities or joint swelling noted.   Neuro: alert, no focal deficits noted.    Skin: Warm, no lesions or rashes    Lab Results:  CBC    Component Value Date/Time   WBC 8.0 04/30/2023 0220   RBC 4.25 04/30/2023 0220   HGB 13.3 04/30/2023 0220   HGB 14.7 07/29/2016 0759   HCT 40.6 04/30/2023 0220   HCT 41.9 07/29/2016 0759   PLT 202 04/30/2023 0220   PLT 271 07/29/2016 0759   MCV 95.5 04/30/2023 0220   MCV 91 07/29/2016 0759   MCH 31.3 04/30/2023 0220   MCHC 32.8 04/30/2023 0220   RDW 12.4 04/30/2023 0220   RDW 12.7 07/29/2016 0759   LYMPHSABS 1.3 04/29/2023 0232   LYMPHSABS 2.1 07/29/2016 0759   MONOABS 0.8 04/29/2023 0232   EOSABS 0.0 04/29/2023 0232   EOSABS 0.1 07/29/2016 0759   BASOSABS 0.0 04/29/2023 0232   BASOSABS 0.0 07/29/2016 0759    BMET    Component Value Date/Time   NA 135 04/30/2023 0220   K 3.8 04/30/2023 0220   CL 97 (L) 04/30/2023 0220   CO2 28 04/30/2023 0220   GLUCOSE 111 (H) 04/30/2023 0220   BUN 20 04/30/2023 0220   CREATININE 0.79 04/30/2023 0220   CALCIUM  8.6 (L) 04/30/2023 0220   GFRNONAA >60 04/30/2023 0220   GFRAA >60 08/23/2014 0415     BNP    Component Value Date/Time  BNP 41.2 04/30/2023 0220    ProBNP No results found for: PROBNP  Imaging: LONG TERM MONITOR-LIVE TELEMETRY (3-14 DAYS) Result Date: 11/26/2023 Patch Wear Time:  13 days and 22 hours (2025-07-09T16:42:08-398 to 2025-07-23T14:57:38-0400) Patient had a min HR of 53 bpm, max HR of 131 bpm, and avg HR of 82 bpm. Predominant underlying rhythm was Sinus Rhythm. 1 run of Supraventricular Tachycardia occurred lasting 5 beats with a max rate of 118 bpm (avg 113 bpm). Isolated SVEs were rare (<1.0%), SVE Couplets were rare (<1.0%), and SVE Triplets were rare (<1.0%). Isolated VEs were rare (<1.0%), VE Couplets were rare (<1.0%), and no VE Triplets were present. Sinus bradycardia, normal sinus rhythm, sinus tachycardia, occasional PAC, 1 5 beat run of SVT, rare PVC and couplet. Redell Shallow, MD   tezepelumab -ekko (TEZSPIRE ) 210 MG/1. syringe 210 mg     Date Action Dose Route User   12/02/2023 1027 Given 210 mg Subcutaneous (Left Arm) Marcine Isaiah CROME, CMA   11/04/2023 9047 Given 210 mg Subcutaneous (Right Arm) Voncannon, Lowry Bala M, CMA           No data to display          No results found for: NITRICOXIDE      Assessment & Plan:   No problem-specific Assessment & Plan notes found for this encounter.  Assessment and Plan Assessment & Plan Chronic lung nodules and pulmonary hamartoma, stable   Lung nodules and pulmonary hamartoma remain stable with no changes on recent CT scan, appearing benign and non-cancerous. No additional imaging is required due to stability and non-smoking status.  Asthma, managed by allergy/immunology with stable control   Asthma is well-managed by Dr. Luke with current medications: Symbicort , Allegra, Singulair , Spiriva  inhaler, and Tezspire  injections. Continue current asthma management regimen.  History of stroke (chronic cerebral infarct)   Chronic cerebral infarct identified on MRI with no acute  deficits. Delay sleep study due to current circumstances. She will contact the clinic when ready to reschedule.  Atherosclerotic cardiovascular disease and hypertension, risk modification   Atherosclerosis is consistent with age and stroke history. Blood pressure is managed with lisinopril , and cholesterol with statin therapy. Caution is advised with lisinopril  due to potential cough side effect. Cardiologist follow-up is scheduled for September 29th with Dr. Ozell Exon.  History of COVID-19 pneumonia, resolved with residual pleural thickening   Previous COVID-19 pneumonia has resolved with residual pleural thickening. No acute findings on recent imaging.  Glaucoma, status post failed right eye surgery, improving   Post-surgical improvement is noted with decreased intraocular pressure and improving vision. New glasses are needed. Long-term improvement is expected.  Knee osteoarthritis, status post right knee replacement, left knee pending   Right knee replacement was completed in 2023. Left knee replacement is pending due to recent eye surgery. Proceed with left knee surgery after eye recovery.  Osteoarthritis of the spine   Osteoarthritis is noted on imaging. No acute intervention is required.  Adrenal gland nodule, stable   A small adrenal gland nodule appears stable and non-concerning.  Goals of Care   She prefers to delay the sleep study due to current personal circumstances and will contact the clinic when ready to proceed.  Follow-Up   Follow-up is scheduled in one year with Dr. Geronimo. She is advised to contact the clinic if earlier intervention is needed.     Madelin Stank, NP 12/08/2023

## 2023-12-08 NOTE — Patient Instructions (Addendum)
 Call us  back when you are ready to reschedule sleep study.  Activity as tolerated  Continue on Asthma regimen and follow up with Asthma Allergy.  Flu and Covid vaccine this fall as discussed.  Follow up in 1 year and As needed

## 2023-12-24 ENCOUNTER — Institutional Professional Consult (permissible substitution) (INDEPENDENT_AMBULATORY_CARE_PROVIDER_SITE_OTHER): Admitting: Otolaryngology

## 2023-12-30 ENCOUNTER — Ambulatory Visit (INDEPENDENT_AMBULATORY_CARE_PROVIDER_SITE_OTHER)

## 2023-12-30 DIAGNOSIS — J4550 Severe persistent asthma, uncomplicated: Secondary | ICD-10-CM

## 2024-01-11 ENCOUNTER — Ambulatory Visit: Attending: Cardiovascular Disease | Admitting: Cardiovascular Disease

## 2024-01-11 ENCOUNTER — Encounter: Payer: Self-pay | Admitting: Cardiovascular Disease

## 2024-01-11 VITALS — BP 128/76 | HR 76 | Ht 62.0 in | Wt 220.0 lb

## 2024-01-11 DIAGNOSIS — I2510 Atherosclerotic heart disease of native coronary artery without angina pectoris: Secondary | ICD-10-CM | POA: Insufficient documentation

## 2024-01-11 DIAGNOSIS — E7800 Pure hypercholesterolemia, unspecified: Secondary | ICD-10-CM | POA: Diagnosis present

## 2024-01-11 DIAGNOSIS — I10 Essential (primary) hypertension: Secondary | ICD-10-CM | POA: Diagnosis present

## 2024-01-11 NOTE — Patient Instructions (Signed)
 Medication Instructions:  Your physician recommends that you continue on your current medications as directed. Please refer to the Current Medication list given to you today.  *If you need a refill on your cardiac medications before your next appointment, please call your pharmacy*  Lab Work: none If you have labs (blood work) drawn today and your tests are completely normal, you will receive your results only by: MyChart Message (if you have MyChart) OR A paper copy in the mail If you have any lab test that is abnormal or we need to change your treatment, we will call you to review the results.  Testing/Procedures: Your physician has requested that you have an echocardiogram. Echocardiography is a painless test that uses sound waves to create images of your heart. It provides your doctor with information about the size and shape of your heart and how well your heart's chambers and valves are working. This procedure takes approximately one hour. There are no restrictions for this procedure. Please do NOT wear cologne, perfume, aftershave, or lotions (deodorant is allowed). Please arrive 15 minutes prior to your appointment time.  Please note: We ask at that you not bring children with you during ultrasound (echo/ vascular) testing. Due to room size and safety concerns, children are not allowed in the ultrasound rooms during exams. Our front office staff cannot provide observation of children in our lobby area while testing is being conducted. An adult accompanying a patient to their appointment will only be allowed in the ultrasound room at the discretion of the ultrasound technician under special circumstances. We apologize for any inconvenience.  Dr Verlin has requested you have a cardiac PET CT scan  Follow-Up: At [facility], you and your health needs are our priority.  As part of our continuing mission to provide you with exceptional heart care, our providers are all part of  one team.  This team includes your primary Cardiologist (physician) and Advanced Practice Providers or APPs (Physician Assistants and Nurse Practitioners) who all work together to provide you with the care you need, when you need it.  Your next appointment:   12 month(s)  Provider:   Lonni Verlin, MD    We recommend signing up for the patient portal called MyChart.  Sign up information is provided on this After Visit Summary.  MyChart is used to connect with patients for Virtual Visits (Telemedicine).  Patients are able to view lab/test results, encounter notes, upcoming appointments, etc.  Non-urgent messages can be sent to your provider as well.   To learn more about what you can do with MyChart, go to [email or web address].   Other Instructions    Please report to Radiology at the [facility] Main Entrance 30 minutes early for your test.  [address] [place], [place] [address]                         OR   Please report to Radiology at [facility] Main Entrance, medical mall, 30 mins prior to your test.  [address]  [place], [place]  How to Prepare for Your Cardiac PET/CT Stress Test:  Nothing to eat or drink, except water, 3 hours prior to arrival time.  NO caffeine/decaffeinated products, or chocolate 12 hours prior to arrival. (Please note decaffeinated beverages (teas/coffees) still contain caffeine).  If you have caffeine within 12 hours prior, the test will need to be rescheduled.  Medication instructions: Do not take  erectile dysfunction medications for 72 hours prior to test (sildenafil, tadalafil) Do not take nitrates (isosorbide mononitrate, Ranexa) the day before or day of test Do not take tamsulosin the day before or morning of test Hold theophylline containing medications for 12 hours. Hold Dipyridamole 48 hours prior to the test.  Diabetic Preparation: If able to eat breakfast prior to 3 hour fasting, you  may take all medications, including your insulin. Do not worry if you miss your breakfast dose of insulin - start at your next meal. If you do not eat prior to 3 hour fast-Hold all diabetes (oral and insulin) medications. Patients who wear a continuous glucose monitor MUST remove the device prior to scanning.  You may take your remaining medications with water.  NO perfume, cologne or lotion on chest or abdomen area. FEMALES - Please avoid wearing dresses to this appointment.  Total time is 1 to 2 hours; you may want to bring reading material for the waiting time.  IF YOU THINK YOU MAY BE PREGNANT, OR ARE NURSING PLEASE INFORM THE TECHNOLOGIST.  In preparation for your appointment, medication and supplies will be purchased.  Appointment availability is limited, so if you need to cancel or reschedule, please call the Radiology Department Scheduler at [phone] 24 hours in advance to avoid a cancellation fee of $100.00  What to Expect When you Arrive:  Once you arrive and check in for your appointment, you will be taken to a preparation room within the Radiology Department.  A technologist or Nurse will obtain your medical history, verify that you are correctly prepped for the exam, and explain the procedure.  Afterwards, an IV will be started in your arm and electrodes will be placed on your skin for EKG monitoring during the stress portion of the exam. Then you will be escorted to the PET/CT scanner.  There, staff will get you positioned on the scanner and obtain a blood pressure and EKG.  During the exam, you will continue to be connected to the EKG and blood pressure machines.  A small, safe amount of a radioactive tracer will be injected in your IV to obtain a series of pictures of your heart along with an injection of a stress agent.    After your Exam:  It is recommended that you eat a meal and drink a caffeinated beverage to counter act any effects of the stress agent.  Drink plenty of  fluids for the remainder of the day and urinate frequently for the first couple of hours after the exam.  Your doctor will inform you of your test results within 7-10 business days.  For more information and frequently asked questions, please visit our website: [email or web address]  For questions about your test or how to prepare for your test, please call: Cardiac Imaging Nurse Navigators Office: [phone]

## 2024-01-11 NOTE — Progress Notes (Signed)
 Chief Complaint  Patient presents with   Follow-up    CAD   History of Present Illness: 68 yo female with history of anxiety, asthma, GERD, HTN and CAD who is here today as a new patient to re-establish cardiac care. She was seen in August 2019 by Dr. Dann to discuss finding of coronary artery calcification noted on chest CT. She had no worrisome symptoms in 2019 and stress testing was not pursued. She had a TIA in June 2025. Carotid artery dopplers June 2025 with mild RICA stenosis. Cardiac monitor with sinus rhythm, one 5 beat run of SVT, Rare PACs and PVCs. Chest CT July 2025 with coronary artery calcification. She tells me today that she feels well overall. No chest pain, dyspnea or dizziness. No LE edema on Lasix .   Primary Care Physician: Arloa Elsie SAUNDERS, MD   Past Medical History:  Diagnosis Date   Anxiety    Arthritis    Asthma    Chronic dermatitis 10/19/2019   Edema    GERD (gastroesophageal reflux disease)    Heart murmur    as a child    Hypertension    Lung nodules    Osteoarthritis    Prediabetes     Past Surgical History:  Procedure Laterality Date   CHOLECYSTECTOMY     eye stin Right 07/28/2023   REPLACEMENT TOTAL KNEE     ROTATOR CUFF REPAIR     TONSILLECTOMY     TOTAL HIP ARTHROPLASTY Right 08/22/2014   Procedure: RIGHT TOTAL HIP ARTHROPLASTY ANTERIOR APPROACH;  Surgeon: Donnice Car, MD;  Location: [facility];  Service: Orthopedics;  Laterality: Right;   TOTAL HIP ARTHROPLASTY Left 01/2019    Current Outpatient Medications  Medication Sig Dispense Refill   albuterol  (PROAIR  HFA) 108 (90 Base) MCG/ACT inhaler Inhale 2 puffs into the lungs every 4 (four) hours as needed. 18 g 1   albuterol  (PROVENTIL ) (2.5 MG/3ML) 0.083% nebulizer solution USE 1 VIAL IN NEBULIZER EVERY 4 HOURS AS NEEDED FOR WHEEZING OR  SHORTNESS  OF  BREATH 600 mL 0   aspirin  EC 81 MG tablet Take 81 mg by mouth daily. Swallow whole.     atorvastatin  (LIPITOR) 40 MG tablet Take 1  tablet (40 mg total) by mouth daily. 30 tablet 5   azelastine  (ASTELIN ) 0.1 % nasal spray 1 spray as needed for rhinitis or allergies.     betaxolol  (BETOPTIC -S) 0.5 % ophthalmic suspension Place 1 drop into both eyes 2 (two) times daily.     budesonide -formoterol  (SYMBICORT ) 160-4.5 MCG/ACT inhaler Inhale 2 puffs into the lungs in the morning and at bedtime. with spacer and rinse mouth afterwards. 1 each 11   calcium -vitamin D (OSCAL WITH D) 500-200 MG-UNIT per tablet Take 1 tablet by mouth daily with breakfast.     diazepam  (VALIUM ) 5 MG tablet Take 5 mg by mouth every 6 (six) hours as needed for anxiety.     dorzolamide  (TRUSOPT ) 2 % ophthalmic solution Place 1 drop into both eyes 2 (two) times daily.     EPINEPHrine  0.3 mg/0.3 mL IJ SOAJ injection Inject 0.3 mg into the muscle as needed for anaphylaxis. 2 each 1   Ferrous Sulfate  (IRON) 325 (65 Fe) MG TABS Take 1 tablet by mouth daily.     fexofenadine (ALLEGRA) 180 MG tablet Take 180 mg by mouth daily.     furosemide  (LASIX ) 20 MG tablet Take 40 mg by mouth every morning.     glucosamine-chondroitin 500-400 MG tablet Take 1  tablet by mouth 2 (two) times daily.     latanoprost  (XALATAN ) 0.005 % ophthalmic solution Place 1 drop into both eyes at bedtime.     lisinopril  (ZESTRIL ) 20 MG tablet Take 40 mg by mouth daily.     montelukast  (SINGULAIR ) 10 MG tablet Take 1 tablet (10 mg total) by mouth at bedtime. 30 tablet 5   Multiple Vitamin (MULTIVITAMIN WITH MINERALS) TABS tablet Take 1 tablet by mouth daily.     Omega-3 Fatty Acids (OMEGA 3 PO) Take 1 capsule by mouth daily.     omeprazole  (PRILOSEC) 20 MG capsule Take 1 capsule (20 mg total) by mouth daily. 30 capsule 5   SPIRIVA  HANDIHALER 18 MCG inhalation capsule Place 1 capsule (18 mcg total) into inhaler and inhale daily. 30 capsule 11   vitamin C  (ASCORBIC ACID ) 500 MG tablet Take 500 mg by mouth daily.     NEOMYCIN -POLYMYXIN-HYDROCORTISONE (CORTISPORIN) 1 % SOLN OTIC solution Apply 1-2  drops to toe BID after soaking (Patient not taking: Reported on 01/11/2024) 10 mL 1   Current Facility-Administered Medications  Medication Dose Route Frequency Provider Last Rate Last Admin   tezepelumab -ekko (TEZSPIRE ) 210 MG/1. syringe 210 mg  210 mg Subcutaneous Q28 days Luke Orlan HERO, DO   210 mg at 12/30/23 1054    Allergies  Allergen Reactions   Advair Diskus [Fluticasone -Salmeterol]     Per patient, she thinks she developed walking PNA.    Avelox [Moxifloxacin Hcl In Nacl] Other (See Comments)    Muscle Aches   Ciprofloxacin Hives   Irbesartan Other (See Comments)   Levofloxacin Other (See Comments)    Tendon pain   Olmesartan Other (See Comments)   Other Other (See Comments)   Protonix [Pantoprazole Sodium] Diarrhea   Trimethoprim Hives   Sulfamethoxazole-Trimethoprim Rash    Social History   Socioeconomic History   Marital status: Married    Spouse name: Not on file   Number of children: Not on file   Years of education: Not on file   Highest education level: Not on file  Occupational History   Not on file  Tobacco Use   Smoking status: Never   Smokeless tobacco: Never  Vaping Use   Vaping status: Never Used  Substance and Sexual Activity   Alcohol use: No   Drug use: No   Sexual activity: Not on file  Other Topics Concern   Not on file  Social History Narrative   Are you right handed or left handed? Right   Are you currently employed ?    What is your current occupation? retire   Do you live at home alone?   Who lives with you? husband   What type of home do you live in: 1 story or 2 story? one    Caffiene 1 soda a day   Social Drivers of Corporate investment banker Strain: Not on file  Food Insecurity: No Food Insecurity (04/20/2023)   Hunger Vital Sign    Worried About Running Out of Food in the Last Year: Never true    Ran Out of Food in the Last Year: Never true  Transportation Needs: No Transportation Needs (04/20/2023)   PRAPARE -  Administrator, Civil Service (Medical): No    Lack of Transportation (Non-Medical): No  Physical Activity: Not on file  Stress: Not on file  Social Connections: Unknown (04/20/2023)   Social Connection and Isolation Panel    Frequency of Communication with Friends and Family:  More than three times a week    Frequency of Social Gatherings with Friends and Family: Once a week    Attends Religious Services: Patient declined    Database administrator or Organizations: Patient declined    Attends Banker Meetings: Patient declined    Marital Status: Married  Catering manager Violence: Not At Risk (04/20/2023)   Humiliation, Afraid, Rape, and Kick questionnaire    Fear of Current or Ex-Partner: No    Emotionally Abused: No    Physically Abused: No    Sexually Abused: No    Family History  Problem Relation Age of Onset   Hypertension Mother    Cancer Mother    Heart disease Father    Heart attack Maternal Grandmother    Cancer Maternal Grandfather    Stroke Paternal Grandmother    Stroke Paternal Grandfather    Allergic rhinitis Neg Hx    Angioedema Neg Hx    Asthma Neg Hx    Eczema Neg Hx    Immunodeficiency Neg Hx    Urticaria Neg Hx     Review of Systems:  As stated in the HPI and otherwise negative.   BP 128/76   Pulse 76   Ht 5' 2 (1.575 m)   Wt 220 lb (99.8 kg)   SpO2 97%   BMI 40.24 kg/m   Physical Examination: General: Well developed, well nourished, NAD  HEENT: OP clear, mucus membranes moist  SKIN: warm, dry. No rashes. Neuro: No focal deficits  Musculoskeletal: Muscle strength 5/5 all ext  Psychiatric: Mood and affect normal  Neck: No JVD, no carotid bruits, no thyromegaly, no lymphadenopathy.  Lungs:Clear bilaterally, no wheezes, rhonci, crackles Cardiovascular: Regular rate and rhythm. No murmurs, gallops or rubs. Abdomen:Soft. Bowel sounds present. Non-tender.  Extremities: No lower extremity edema. Pulses are 2 + in the  bilateral DP/PT.  EKG:  EKG is ordered today. The ekg ordered today demonstrates  EKG Interpretation Date/Time:  Monday January 11 2024 08:50:19 EDT Ventricular Rate:  73 PR Interval:  180 QRS Duration:  90 QT Interval:  382 QTC Calculation: 420 R Axis:   -23  Text Interpretation: Normal sinus rhythm Minimal voltage criteria for LVH, may be normal variant ( R in aVL ) Poor R wave progression Confirmed by Verlin Bruckner [phone]) on 01/11/2024 9:04:06 AM    Recent Labs: 04/19/2023: Magnesium  2.2 04/25/2023: ALT 34 04/30/2023: B Natriuretic Peptide 41.2; BUN 20; Creatinine, Ser 0.79; Hemoglobin 13.3; Platelets 202; Potassium 3.8; Sodium 135   Lipid Panel    Component Value Date/Time   CHOL 200 (H) 10/13/2023 0829   TRIG 106 10/13/2023 0829   HDL 71 10/13/2023 0829   CHOLHDL 2.8 10/13/2023 0829   LDLCALC 109 (H) 10/13/2023 0829     Wt Readings from Last 3 Encounters:  01/11/24 220 lb (99.8 kg)  12/08/23 215 lb 9.6 oz (97.8 kg)  10/12/23 212 lb (96.2 kg)    Assessment and Plan:   1. CAD without angina: No chest pain or dyspnea. Given heavy coronary calcification, will arrange PET CT stress perfusion study. Continue ASA and statin. Echo to assess LV function and exclude structural heart disease.   2. Hyperlipidemia: LDL 109 in July 2025. Lipitor started after that lab. Goal LDL under 70. Continue statin. She has lipid profile planned next week in primary care.   3. HTN: BP controlled on current therapy. Continue current meds.   4. Chronic diastolic CHF:  Wt is stable. Echo to assess LVEF.  Continue Lasix .   Labs/ tests ordered today include:   Orders Placed This Encounter  Procedures   NM PET CT CARDIAC PERFUSION MULTI W/ABSOLUTE BLOODFLOW   EKG 12-Lead   ECHOCARDIOGRAM COMPLETE   Disposition:   F/U with me in one year  Signed, Lonni Cash, MD, [facility] 01/11/2024 9:37 AM    [facility] [address] [place], [place], [place]  [address] Phone:  [phone]; Fax: [phone]

## 2024-01-12 ENCOUNTER — Ambulatory Visit (INDEPENDENT_AMBULATORY_CARE_PROVIDER_SITE_OTHER): Admitting: Otolaryngology

## 2024-01-12 ENCOUNTER — Encounter (INDEPENDENT_AMBULATORY_CARE_PROVIDER_SITE_OTHER): Payer: Self-pay | Admitting: Otolaryngology

## 2024-01-12 VITALS — BP 143/78 | HR 82 | Ht 62.0 in | Wt 212.0 lb

## 2024-01-12 DIAGNOSIS — R0981 Nasal congestion: Secondary | ICD-10-CM | POA: Diagnosis not present

## 2024-01-12 DIAGNOSIS — J3489 Other specified disorders of nose and nasal sinuses: Secondary | ICD-10-CM | POA: Diagnosis not present

## 2024-01-12 DIAGNOSIS — J343 Hypertrophy of nasal turbinates: Secondary | ICD-10-CM | POA: Diagnosis not present

## 2024-01-12 DIAGNOSIS — J342 Deviated nasal septum: Secondary | ICD-10-CM

## 2024-01-12 DIAGNOSIS — J3089 Other allergic rhinitis: Secondary | ICD-10-CM | POA: Diagnosis not present

## 2024-01-12 NOTE — Progress Notes (Signed)
 Dear Dr. Luke, Here is my assessment for our mutual patient, Melanie Armstrong. Thank you for allowing me the opportunity to care for your patient. Please do not hesitate to contact me should you have any other questions. Sincerely, Dr. Eldora Blanch  Otolaryngology Clinic Note  HISTORY: Melanie Armstrong is a 68 y.o. female kindly referred by Dr. Luke for evaluation of allergic rhinitis and chronic nasal congestion  Initial visit (12/2023): She reports that she has had long history of allergic rhinitis with prior AIT and has tried multiple medications including allegra, astelin  (PRN), occasional afrin BID sinus rinses, and is on Terzspire for asthma and this has helped significantly. She used flonase  for years and did well with it but then had Glaucoma but has had multiple surgeries for it, and now has stopped using it as a result. Current complaints include bilateral congestion (alternates) and obstruction. She denies recent sinus infections or facial pressure/pain, discolored drainage, hyposmia. No prior sinonasal surgery.  Allergy testing is upcoming (tomorrow has appt with Dr. Luke). No previous sinonasal surgery.  Does take fair amount of ibuprofen  GLP-1: no AP/AC: ASA 81  Tobacco: no  PMHx: Pulmonary Nodules, Asthma, Glaucoma, GERD, HTN  RADIOGRAPHIC EVALUATION AND INDEPENDENT REVIEW OF OTHER RECORDS:: Dr. Luke 09/16/2023: Asthma on Xolair , AR, GERD; prior AIT, now stopped; noted congestion at night, using astelin  and saline spray and PO anthistamine; Dx: AR and congestion; Rx: Singulair , astelin ; avoid afrin; PO anthistamine CT Face/Sinus (10/21/2011) independently interpreted: right septal deviation, bilateral inferior turbinate hypertrophy; cuts thick, sinuses relatively clear  Past Medical History:  Diagnosis Date   Anxiety    Arthritis    Asthma    Chronic dermatitis 10/19/2019   Edema    GERD (gastroesophageal reflux disease)    Heart murmur    as a child    Hypertension    Lung  nodules    Osteoarthritis    Prediabetes    Past Surgical History:  Procedure Laterality Date   CHOLECYSTECTOMY     eye stin Right 07/28/2023   REPLACEMENT TOTAL KNEE     ROTATOR CUFF REPAIR     TONSILLECTOMY     TOTAL HIP ARTHROPLASTY Right 08/22/2014   Procedure: RIGHT TOTAL HIP ARTHROPLASTY ANTERIOR APPROACH;  Surgeon: Donnice Car, MD;  Location: [facility];  Service: Orthopedics;  Laterality: Right;   TOTAL HIP ARTHROPLASTY Left 01/2019   Family History  Problem Relation Age of Onset   Hypertension Mother    Cancer Mother    Heart disease Father    Heart attack Maternal Grandmother    Cancer Maternal Grandfather    Stroke Paternal Grandmother    Stroke Paternal Grandfather    Allergic rhinitis Neg Hx    Angioedema Neg Hx    Asthma Neg Hx    Eczema Neg Hx    Immunodeficiency Neg Hx    Urticaria Neg Hx    Social History   Tobacco Use   Smoking status: Never   Smokeless tobacco: Never  Substance Use Topics   Alcohol use: No   Allergies  Allergen Reactions   Advair Diskus [Fluticasone -Salmeterol]     Per patient, she thinks she developed walking PNA.    Avelox [Moxifloxacin Hcl In Nacl] Other (See Comments)    Muscle Aches   Ciprofloxacin Hives   Irbesartan Other (See Comments)   Levofloxacin Other (See Comments)    Tendon pain   Olmesartan Other (See Comments)   Other Other (See Comments)   Protonix [Pantoprazole Sodium] Diarrhea  Trimethoprim Hives   Sulfamethoxazole-Trimethoprim Rash   Current Outpatient Medications  Medication Sig Dispense Refill   albuterol  (PROAIR  HFA) 108 (90 Base) MCG/ACT inhaler Inhale 2 puffs into the lungs every 4 (four) hours as needed. 18 g 1   albuterol  (PROVENTIL ) (2.5 MG/3ML) 0.083% nebulizer solution USE 1 VIAL IN NEBULIZER EVERY 4 HOURS AS NEEDED FOR WHEEZING OR  SHORTNESS  OF  BREATH 600 mL 0   aspirin  EC 81 MG tablet Take 81 mg by mouth daily. Swallow whole.     atorvastatin  (LIPITOR) 40 MG tablet Take 1 tablet (40 mg  total) by mouth daily. 30 tablet 5   azelastine  (ASTELIN ) 0.1 % nasal spray 1 spray as needed for rhinitis or allergies.     betaxolol  (BETOPTIC -S) 0.5 % ophthalmic suspension Place 1 drop into both eyes 2 (two) times daily.     budesonide -formoterol  (SYMBICORT ) 160-4.5 MCG/ACT inhaler Inhale 2 puffs into the lungs in the morning and at bedtime. with spacer and rinse mouth afterwards. 1 each 11   calcium -vitamin D (OSCAL WITH D) 500-200 MG-UNIT per tablet Take 1 tablet by mouth daily with breakfast.     diazepam  (VALIUM ) 5 MG tablet Take 5 mg by mouth every 6 (six) hours as needed for anxiety.     dorzolamide  (TRUSOPT ) 2 % ophthalmic solution Place 1 drop into both eyes 2 (two) times daily.     EPINEPHrine  0.3 mg/0.3 mL IJ SOAJ injection Inject 0.3 mg into the muscle as needed for anaphylaxis. 2 each 1   Ferrous Sulfate  (IRON) 325 (65 Fe) MG TABS Take 1 tablet by mouth daily.     fexofenadine (ALLEGRA) 180 MG tablet Take 180 mg by mouth daily.     furosemide  (LASIX ) 20 MG tablet Take 40 mg by mouth every morning.     glucosamine-chondroitin 500-400 MG tablet Take 1 tablet by mouth 2 (two) times daily.     latanoprost  (XALATAN ) 0.005 % ophthalmic solution Place 1 drop into both eyes at bedtime.     lisinopril  (ZESTRIL ) 20 MG tablet Take 40 mg by mouth daily.     montelukast  (SINGULAIR ) 10 MG tablet Take 1 tablet (10 mg total) by mouth at bedtime. 30 tablet 5   Multiple Vitamin (MULTIVITAMIN WITH MINERALS) TABS tablet Take 1 tablet by mouth daily.     Omega-3 Fatty Acids (OMEGA 3 PO) Take 1 capsule by mouth daily.     omeprazole  (PRILOSEC) 20 MG capsule Take 1 capsule (20 mg total) by mouth daily. 30 capsule 5   SPIRIVA  HANDIHALER 18 MCG inhalation capsule Place 1 capsule (18 mcg total) into inhaler and inhale daily. 30 capsule 11   vitamin C  (ASCORBIC ACID ) 500 MG tablet Take 500 mg by mouth daily.     NEOMYCIN -POLYMYXIN-HYDROCORTISONE (CORTISPORIN) 1 % SOLN OTIC solution Apply 1-2 drops to toe  BID after soaking (Patient not taking: Reported on 01/12/2024) 10 mL 1   Current Facility-Administered Medications  Medication Dose Route Frequency Provider Last Rate Last Admin   tezepelumab -ekko (TEZSPIRE ) 210 MG/1. syringe 210 mg  210 mg Subcutaneous Q28 days Luke Orlan HERO, DO   210 mg at 12/30/23 1054   BP (!) 143/78 (BP Location: Left Arm, Patient Position: Sitting, Cuff Size: Large)   Pulse 82   Ht 5' 2 (1.575 m)   Wt 212 lb (96.2 kg)   SpO2 (!) 89%   BMI 38.78 kg/m   PHYSICAL EXAM:  BP (!) 143/78 (BP Location: Left Arm, Patient Position: Sitting, Cuff Size: Large)  Pulse 82   Ht 5' 2 (1.575 m)   Wt 212 lb (96.2 kg)   SpO2 (!) 89%   BMI 38.78 kg/m    Salient findings:  CN II-XII intact Bilateral EAC clear and TM intact with well pneumatized middle ear spaces Nose: Anterior rhinoscopy reveals septum right more anterior deviation with left posterior spur .  Nasal endoscopy was indicated to better evaluate the nose and paranasal sinuses, given the patient's history and exam findings, and is detailed below. No lesions of oral cavity/oropharynx No respiratory distress or stridor   PROCEDURE:  Prior to initiating any procedures, risks/benefits/alternatives were explained to the patient and verbal consent obtained. Diagnostic Nasal Endoscopy Pre-procedure diagnosis: Nasal congestion, nasal obstruction Post-procedure diagnosis: same Indication: See pre-procedure diagnosis and physical exam above Complications: None apparent EBL: 0 mL Anesthesia: Lidocaine  4% and topical decongestant was topically sprayed in each nasal cavity  Description of Procedure:  Patient was identified. A rigid 30 degree endoscope was utilized to evaluate the sinonasal cavities, mucosa, sinus ostia and turbinates and septum.  Overall, signs of mucosal inflammation are not noted.  Also noted are large septal spur on left with more dorsal posterior right dev.  No mucopurulence, polyps, or masses noted.    Right Middle meatus: clear Right SE Recess: clear Left MM: clear Left SE Recess: clear Photodocumentation was obtained.  CPT CODE -- 31231 - Mod 25   ASSESSMENT:  68 y.o. with:  1. Nasal congestion   2. Nasal septal deviation   3. Hypertrophy of both inferior nasal turbinates   4. Nasal obstruction   5. Allergic rhinitis    Multiple issues. Has glaucoma and cannot use INCS as a result. Endo with septal deviation and turbinate hypertrophy, and she is a good Septo/turbs candidate.   PLAN: We've discussed issues and options today.  We reviewed the nasal endoscopy images together.  The risks, benefits and alternatives were discussed and questions answered.    She has symptoms despite medical management so we discussed septo/turbs. She would like to avoid it for now, but will call us  if she wishes to proceed  Continue current medical management per allergy.  See below regarding exact medications prescribed this encounter including dosages and route: No orders of the defined types were placed in this encounter.    Thank you for allowing me the opportunity to care for your patient. Please do not hesitate to contact me should you have any other questions.  Sincerely, Eldora Blanch, MD Otolaryngologist (ENT), [facility] Phone: [phone] Fax: [phone]  I have personally spent 45 minutes involved in face-to-face and non-face-to-face activities for this patient on the day of the visit.  Professional time spent excludes any procedures performed but includes the following activities, in addition to those noted in the documentation: preparing to see the patient (review of outside documentation and results), performing a medically appropriate examination, counseling, documenting in the electronic health record, independently interpreting results (CT).   01/12/2024, 12:40 PM

## 2024-01-13 ENCOUNTER — Encounter: Payer: Self-pay | Admitting: Allergy

## 2024-01-13 ENCOUNTER — Ambulatory Visit: Admitting: Allergy

## 2024-01-13 ENCOUNTER — Other Ambulatory Visit: Payer: Self-pay

## 2024-01-13 VITALS — BP 134/76 | HR 73 | Temp 98.2°F | Resp 16 | Ht 63.0 in | Wt 220.2 lb

## 2024-01-13 DIAGNOSIS — J3089 Other allergic rhinitis: Secondary | ICD-10-CM | POA: Diagnosis not present

## 2024-01-13 DIAGNOSIS — K219 Gastro-esophageal reflux disease without esophagitis: Secondary | ICD-10-CM

## 2024-01-13 DIAGNOSIS — J4550 Severe persistent asthma, uncomplicated: Secondary | ICD-10-CM | POA: Diagnosis not present

## 2024-01-13 MED ORDER — AZELASTINE HCL 0.1 % NA SOLN: 1.0000 | Freq: Two times a day (BID) | NASAL | 5 refills | Status: AC | PRN

## 2024-01-13 MED ORDER — SPIRIVA HANDIHALER 18 MCG IN CAPS: 18.0000 ug | ORAL_CAPSULE | Freq: Every day | RESPIRATORY_TRACT | 11 refills | Status: AC

## 2024-01-13 MED ORDER — BUDESONIDE-FORMOTEROL FUMARATE 160-4.5 MCG/ACT IN AERO: 2.0000 | INHALATION_SPRAY | Freq: Two times a day (BID) | RESPIRATORY_TRACT | 11 refills | Status: AC

## 2024-01-13 MED ORDER — MONTELUKAST SODIUM 10 MG PO TABS: 10.0000 mg | ORAL_TABLET | Freq: Every day | ORAL | 3 refills | Status: AC

## 2024-01-13 NOTE — Patient Instructions (Addendum)
 Asthma: Daily controller medication(s): you may try to decrease Symbicort  160mcg 1-2 puffs twice a day with spacer and rinse mouth afterwards. Continue Spiriva  once a day. Continue montelukast  10mg  daily. Continue Tezspire  injections every 4 weeks.  May use albuterol  rescue inhaler 2 puffs every 4 to 6 hours as needed for shortness of breath, chest tightness, coughing, and wheezing. May use albuterol  rescue inhaler 2 puffs 5 to 15 minutes prior to strenuous physical activities. Monitor frequency of use - if you need to use it more than twice per week on a consistent basis let us  know.  Asthma control goals:  Full participation in all desired activities (may need albuterol  before activity) Albuterol  use two times or less a week on average (not counting use with activity) Cough interfering with sleep two times or less a month Oral steroids no more than once a year No hospitalizations  Allergic rhinitis Continue montelukast  10mg  daily. Use azelastine  nasal spray 1-2 sprays per nostril twice a day as needed for runny nose/drainage. Nasal saline spray (i.e., Simply Saline) or nasal saline lavage (i.e., NeilMed) is recommended as needed and prior to medicated nasal sprays. Use over the counter antihistamines such as Zyrtec (cetirizine), Claritin  (loratadine ), Allegra (fexofenadine), or Xyzal (levocetirizine) daily as needed. May switch antihistamines every few months.  Skin testing instructions:  Return for allergy skin testing. Will make additional recommendations based on results. Make sure you don't take any antihistamines for 3 days before the skin testing appointment. Don't put any lotion on the back and arms on the day of testing.  Must be in good health and not ill. No vaccines/injections/antibiotics within the past 7 days.  Plan on being here for 30-60 minutes.  Stop azelastine  and allegra 3 days before skin testing.  Reflux Continue with omeprazole  20mg  in the morning.  Skin  testing in 1 month.  Follow up in 4 months or sooner if needed.  Recommend flu and covid-19 vaccine

## 2024-01-13 NOTE — Progress Notes (Signed)
 Follow Up Note  RE: Melanie Armstrong MRN: [id number] DOB: 08-Jun-1955 Date of Office Visit: 01/13/2024  Referring provider: Arloa Elsie SAUNDERS, MD Primary care provider: Arloa Elsie SAUNDERS, MD  Chief Complaint: Follow-up, Asthma, and Allergic Rhinitis  (Congestion )  History of Present Illness: I had the pleasure of seeing Melanie Armstrong for a follow up visit at the [facility] on 01/13/2024. She is a 68 y.o. female, who is being followed for asthma on Tezspire , allergic rhinitis, GERD. Her previous allergy office visit was on 09/16/2023 with Dr. Luke. Today is a regular follow up visit.  Discussed the use of AI scribe software for clinical note transcription with the patient, who gave verbal consent to proceed.    She has been using Tezspire  since June, experiencing significant improvement in her asthma symptoms without side effects. Previously, she used Xolair  but did not qualify for other treatments due to her eosinophil levels. Her use of the rescue inhaler has decreased to approximately once every two weeks. She has not required any ER or urgent care visits for breathing problems.  Her current medication regimen includes Symbicort , taking two puffs twice a day, and Spiriva  at night. She also takes montelukast  daily and Allegra for allergies. She continues to see her pulmonologist due to CT scan changes, and her last visit was in August. She is scheduled for a stress test on October 28th following a cardiology follow-up where plaque was noted; her last visit to the cardiologist was six years ago.  She has a history of glaucoma with two eye surgeries, including a trabeculectomy in the right eye after the first surgery failed. She is concerned about the impact of the inhalers on her eyes.  She has a deviated septum on the left side, as diagnosed by her ENT doctor. She uses azelastine  as needed which worsens with weather changes, and a neti pot twice a day. Her reflux is managed  with daily omeprazole . She had COVID in January, which required hospitalization, but has not had issues with the COVID vaccine in the past.  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 She notes improvement in her asthma symptoms since retirement.     12/08/2023 pulm visit: Chronic lung nodules and pulmonary hamartoma, stable on CT chest  10/13/23 .  Reviewed in detail with patient.  Lung nodules and pulmonary hamartoma remain stable with no changes on recent CT scan, appearing benign in never smoker. No additional imaging is required due to stability and non-smoking status.   Asthma, managed by allergy/immunology with stable control   Asthma is well-managed by Dr. Luke with current medications: Symbicort , Allegra, Singulair , Spiriva  inhaler, and Tezspire  injections. Continue current asthma management regimen.   History of stroke (chronic on MRI)   Continue follow-up with neurology   Atherosclerotic cardiovascular disease and hypertension, risk modification     Continue follow-up with cardiology   History of COVID-19 pneumonia, resolved with residual pleural thickening   Previous COVID-19 pneumonia has resolved with residual pleural thickening. No acute findings on recent imaging.   Glaucoma,  improving   Continue follow-up with ophthalmology   Snoring, restless sleep, daytime sleepiness.  Please contact her office if you change her mind on sleep study.     Follow-Up   Follow-up is scheduled in one year with Dr. Geronimo. She is advised to contact the clinic if earlier intervention is needed.  Assessment and Plan:  Melanie Armstrong is a 68 y.o. female with: Severe persistent asthma without complication Past history - follows with pulmonology for pulmonary nodules. Prefers Symbicort  + Spiriva  over Breztri. Hospitalized for Covid-19 pneumonia. Stopped Xolair  in 2025.  Interim history - Started Tezspire  on 10/07/23 and doing  better. Today's spirometry was normal.  Daily controller medication(s): you may try to decrease Symbicort  160mcg 1-2 puffs twice a day with spacer and rinse mouth afterwards. Continue Spiriva  once a day. Continue montelukast  10mg  daily. Continue Tezspire  injections every 4 weeks.  May use albuterol  rescue inhaler 2 puffs every 4 to 6 hours as needed for shortness of breath, chest tightness, coughing, and wheezing. May use albuterol  rescue inhaler 2 puffs 5 to 15 minutes prior to strenuous physical activities. Monitor frequency of use - if you need to use it more than twice per week on a consistent basis let us  know.  Get spirometry at next visit. Follow up with pulm regarding CT chest changes.    Other allergic rhinitis Past history - started AIT on W-M before 2016 and stopped AIT in Dec 2024. No steroid nasal sprays due to glaucoma.  Interim history - saw ENT and offered surgery. Still has nasal congestion. Wants to retest as off Xolair  now.  Continue montelukast  10mg  daily. Use azelastine  nasal spray 1-2 sprays per nostril twice a day as needed for runny nose/drainage. Nasal saline spray (i.e., Simply Saline) or nasal saline lavage (i.e., NeilMed) is recommended as needed and prior to medicated nasal sprays. Use over the counter antihistamines such as Zyrtec (cetirizine), Claritin  (loratadine ), Allegra (fexofenadine), or Xyzal (levocetirizine) daily as needed. May switch antihistamines every few months. Return for environmental testing.   Gastroesophageal reflux disease, unspecified whether esophagitis present Continue with omeprazole  20mg  in the morning.   Return in about 4 weeks (around 02/10/2024) for Skin testing. Recommend flu and covid-19 vaccine   Meds ordered this encounter  Medications   SPIRIVA  HANDIHALER 18 MCG inhalation capsule    Sig: Place 1 capsule (18 mcg total) into inhaler and inhale daily.    Dispense:  30 capsule    Refill:  11   montelukast  (SINGULAIR ) 10 MG  tablet    Sig: Take 1 tablet (10 mg total) by mouth at bedtime.    Dispense:  90 tablet    Refill:  3   budesonide -formoterol  (SYMBICORT ) 160-4.5 MCG/ACT inhaler    Sig: Inhale 2 puffs into the lungs in the morning and at bedtime. with spacer and rinse mouth afterwards.    Dispense:  1 each    Refill:  11   azelastine  (ASTELIN ) 0.1 % nasal spray    Sig: Place 1-2 sprays into both nostrils 2 (two) times daily as needed (nasal drainage). Use in each nostril as directed    Dispense:  30 mL    Refill:  5   Lab Orders  No laboratory test(s) ordered today    Diagnostics: Spirometry:  Tracings reviewed. Her effort: It was hard to get consistent efforts and there is a question as to whether this reflects a maximal maneuver. FVC: 2.71L FEV1: 2.42L, 112% predicted FEV1/FVC ratio: 89% Interpretation: No overt abnormalities noted given today's efforts.  Please see scanned spirometry results for details.  Results discussed with patient/family.   Medication List:  Current Outpatient Medications  Medication Sig Dispense Refill   albuterol  (PROAIR  HFA) 108 (90 Base) MCG/ACT inhaler Inhale 2 puffs into the lungs every 4 (four) hours as needed. 18 g 1   albuterol  (PROVENTIL ) (2.5 MG/3ML) 0.083% nebulizer  solution USE 1 VIAL IN NEBULIZER EVERY 4 HOURS AS NEEDED FOR WHEEZING OR  SHORTNESS  OF  BREATH 600 mL 0   aspirin  EC 81 MG tablet Take 81 mg by mouth daily. Swallow whole.     atorvastatin  (LIPITOR) 40 MG tablet Take 1 tablet (40 mg total) by mouth daily. 30 tablet 5   azelastine  (ASTELIN ) 0.1 % nasal spray Place 1-2 sprays into both nostrils 2 (two) times daily as needed (nasal drainage). Use in each nostril as directed 30 mL 5   betaxolol  (BETOPTIC -S) 0.5 % ophthalmic suspension Place 1 drop into both eyes 2 (two) times daily.     calcium -vitamin D (OSCAL WITH D) 500-200 MG-UNIT per tablet Take 1 tablet by mouth daily with breakfast.     diazepam  (VALIUM ) 5 MG tablet Take 5 mg by mouth every  6 (six) hours as needed for anxiety.     dorzolamide  (TRUSOPT ) 2 % ophthalmic solution Place 1 drop into both eyes 2 (two) times daily.     EPINEPHrine  0.3 mg/0.3 mL IJ SOAJ injection Inject 0.3 mg into the muscle as needed for anaphylaxis. 2 each 1   Ferrous Sulfate  (IRON) 325 (65 Fe) MG TABS Take 1 tablet by mouth daily.     fexofenadine (ALLEGRA) 180 MG tablet Take 180 mg by mouth daily.     furosemide  (LASIX ) 20 MG tablet Take 40 mg by mouth every morning.     glucosamine-chondroitin 500-400 MG tablet Take 1 tablet by mouth 2 (two) times daily.     latanoprost  (XALATAN ) 0.005 % ophthalmic solution Place 1 drop into both eyes at bedtime.     lisinopril  (ZESTRIL ) 20 MG tablet Take 40 mg by mouth daily.     montelukast  (SINGULAIR ) 10 MG tablet Take 1 tablet (10 mg total) by mouth at bedtime. 90 tablet 3   Multiple Vitamin (MULTIVITAMIN WITH MINERALS) TABS tablet Take 1 tablet by mouth daily.     Omega-3 Fatty Acids (OMEGA 3 PO) Take 1 capsule by mouth daily.     omeprazole  (PRILOSEC) 20 MG capsule Take 1 capsule (20 mg total) by mouth daily. 30 capsule 5   vitamin C  (ASCORBIC ACID ) 500 MG tablet Take 500 mg by mouth daily.     budesonide -formoterol  (SYMBICORT ) 160-4.5 MCG/ACT inhaler Inhale 2 puffs into the lungs in the morning and at bedtime. with spacer and rinse mouth afterwards. 1 each 11   SPIRIVA  HANDIHALER 18 MCG inhalation capsule Place 1 capsule (18 mcg total) into inhaler and inhale daily. 30 capsule 11   Current Facility-Administered Medications  Medication Dose Route Frequency Provider Last Rate Last Admin   tezepelumab -ekko (TEZSPIRE ) 210 MG/1. syringe 210 mg  210 mg Subcutaneous Q28 days Luke Orlan HERO, DO   210 mg at 12/30/23 1054   Allergies: Allergies  Allergen Reactions   Advair Diskus [Fluticasone -Salmeterol]     Per patient, she thinks she developed walking PNA.    Avelox [Moxifloxacin Hcl In Nacl] Other (See Comments)    Muscle Aches   Ciprofloxacin Hives    Irbesartan Other (See Comments)   Levofloxacin Other (See Comments)    Tendon pain   Olmesartan Other (See Comments)   Other Other (See Comments)   Protonix [Pantoprazole Sodium] Diarrhea   Trimethoprim Hives   Sulfamethoxazole-Trimethoprim Rash   I reviewed her past medical history, social history, family history, and environmental history and no significant changes have been reported from her previous visit.  Review of Systems  Constitutional:  Negative for appetite change,  chills, fever and unexpected weight change.  HENT:  Positive for congestion. Negative for rhinorrhea.   Eyes:  Negative for itching.  Respiratory:  Negative for cough, chest tightness, shortness of breath and wheezing.   Gastrointestinal:  Negative for abdominal pain.  Skin:  Negative for rash.  Allergic/Immunologic: Positive for environmental allergies.  Neurological:  Negative for headaches.    Objective: BP 134/76 (BP Location: Left Arm, Patient Position: Sitting, Cuff Size: Normal)   Pulse 73   Temp 98.2 F (36.8 C) (Temporal)   Resp 16   Ht 5' 3 (1.6 m)   Wt 220 lb 3.2 oz (99.9 kg)   SpO2 97%   BMI 39.01 kg/m  Body mass index is 39.01 kg/m. Physical Exam Vitals and nursing note reviewed.  Constitutional:      Appearance: Normal appearance. She is well-developed.  HENT:     Head: Normocephalic and atraumatic.     Right Ear: Tympanic membrane and external ear normal.     Left Ear: Tympanic membrane and external ear normal.     Nose: Nose normal. No congestion or rhinorrhea.     Mouth/Throat:     Mouth: Mucous membranes are moist.     Pharynx: Oropharynx is clear.  Eyes:     Conjunctiva/sclera: Conjunctivae normal.  Cardiovascular:     Rate and Rhythm: Normal rate and regular rhythm.     Heart sounds: Normal heart sounds. No murmur heard. Pulmonary:     Effort: Pulmonary effort is normal.     Breath sounds: Normal breath sounds. No wheezing, rhonchi or rales.  Musculoskeletal:      Cervical back: Neck supple.  Skin:    General: Skin is warm.     Findings: No rash.  Neurological:     Mental Status: She is alert and oriented to person, place, and time.  Psychiatric:        Behavior: Behavior normal.    Previous notes and tests were reviewed. The plan was reviewed with the patient/family, and all questions/concerned were addressed.  It was my pleasure to see Melanie Armstrong today and participate in her care. Please feel free to contact me with any questions or concerns.  Sincerely,  Orlan Cramp, DO Allergy & Immunology  [facility]  [place] office: [phone] [facility] office: [phone]

## 2024-01-18 ENCOUNTER — Institutional Professional Consult (permissible substitution) (INDEPENDENT_AMBULATORY_CARE_PROVIDER_SITE_OTHER): Admitting: Otolaryngology

## 2024-01-28 ENCOUNTER — Ambulatory Visit

## 2024-01-28 ENCOUNTER — Other Ambulatory Visit

## 2024-01-28 DIAGNOSIS — J4550 Severe persistent asthma, uncomplicated: Secondary | ICD-10-CM

## 2024-01-29 ENCOUNTER — Other Ambulatory Visit: Payer: Self-pay | Admitting: Neurology

## 2024-01-29 LAB — HEPATIC FUNCTION PANEL
AG Ratio: 2.3 (calc) (ref 1.0–2.5)
ALT: 15 U/L (ref 6–29)
AST: 13 U/L (ref 10–35)
Albumin: 4.4 g/dL (ref 3.6–5.1)
Alkaline phosphatase (APISO): 96 U/L (ref 37–153)
Bilirubin, Direct: 0.1 mg/dL (ref 0.0–0.2)
Globulin: 1.9 g/dL (ref 1.9–3.7)
Indirect Bilirubin: 0.4 mg/dL (ref 0.2–1.2)
Total Bilirubin: 0.5 mg/dL (ref 0.2–1.2)
Total Protein: 6.3 g/dL (ref 6.1–8.1)

## 2024-01-29 LAB — LIPID PANEL
Cholesterol: 130 mg/dL (ref <200)
HDL: 71 mg/dL (ref >=50)
LDL Cholesterol (Calc): 42 mg/dL
Non-HDL Cholesterol (Calc): 59 mg/dL (ref <130)
Total CHOL/HDL Ratio: 1.8 (calc) (ref <5.0)
Triglycerides: 87 mg/dL (ref <150)

## 2024-01-29 MED ORDER — ATORVASTATIN CALCIUM 40 MG PO TABS: 40.0000 mg | ORAL_TABLET | Freq: Every day | ORAL | 3 refills | Status: AC

## 2024-01-29 NOTE — Progress Notes (Signed)
 Patient advised. Per patient she needs refills atorvastatin 

## 2024-02-08 ENCOUNTER — Ambulatory Visit: Admitting: Allergy

## 2024-02-08 ENCOUNTER — Encounter (HOSPITAL_COMMUNITY): Payer: Self-pay

## 2024-02-10 ENCOUNTER — Encounter (HOSPITAL_COMMUNITY)
Admission: RE | Admit: 2024-02-10 | Discharge: 2024-02-10 | Disposition: A | Discharge status: Home / Self Care | Source: Ambulatory Visit | Attending: Cardiovascular Disease | Admitting: Cardiovascular Disease

## 2024-02-10 ENCOUNTER — Ambulatory Visit: Payer: Self-pay | Admitting: Cardiovascular Disease

## 2024-02-10 DIAGNOSIS — I10 Essential (primary) hypertension: Secondary | ICD-10-CM | POA: Insufficient documentation

## 2024-02-10 DIAGNOSIS — I2510 Atherosclerotic heart disease of native coronary artery without angina pectoris: Secondary | ICD-10-CM | POA: Diagnosis present

## 2024-02-10 LAB — NM PET CT CARDIAC PERFUSION MULTI W/ABSOLUTE BLOODFLOW
MBFR: 2
Nuc Rest EF: 76 %
Nuc Stress EF: 80 %
Peak HR: 101 {beats}/min
Rest HR: 84 {beats}/min
Rest MBF: 1.3 ml/g/min
Rest Nuclear Isotope Dose: 24.9 mCi
ST Depression (mm): 0 mm
Stress MBF: 2.6 ml/g/min
Stress Nuclear Isotope Dose: 25 mCi
TID: 1.15

## 2024-02-10 MED ORDER — REGADENOSON 0.4 MG/5ML IV SOLN
0.4000 mg | Freq: Once | INTRAVENOUS | Status: AC
  Administered 2024-02-10: 0.4 mg via INTRAVENOUS

## 2024-02-10 MED ORDER — REGADENOSON 0.4 MG/5ML IV SOLN
INTRAVENOUS | Status: AC
  Filled 2024-02-10: qty 5

## 2024-02-10 MED ORDER — RUBIDIUM RB82 GENERATOR (RUBYFILL)
24.9400 | PACK | Freq: Once | INTRAVENOUS | Status: AC
  Administered 2024-02-10: 24.94 via INTRAVENOUS

## 2024-02-10 MED ORDER — RUBIDIUM RB82 GENERATOR (RUBYFILL)
25.0200 | PACK | Freq: Once | INTRAVENOUS | Status: AC
  Administered 2024-02-10: 25.02 via INTRAVENOUS

## 2024-02-17 ENCOUNTER — Ambulatory Visit (INDEPENDENT_AMBULATORY_CARE_PROVIDER_SITE_OTHER): Admitting: Allergy

## 2024-02-17 ENCOUNTER — Encounter: Payer: Self-pay | Admitting: Allergy

## 2024-02-17 DIAGNOSIS — J3089 Other allergic rhinitis: Secondary | ICD-10-CM

## 2024-02-17 DIAGNOSIS — K219 Gastro-esophageal reflux disease without esophagitis: Secondary | ICD-10-CM

## 2024-02-17 DIAGNOSIS — J4550 Severe persistent asthma, uncomplicated: Secondary | ICD-10-CM

## 2024-02-17 NOTE — Patient Instructions (Addendum)
 02/17/2024 skin testing positive to dust mites and borderline to cockroach.  Results given.  Environmental allergies Start environmental control measures as below. Use over the counter antihistamines such as Zyrtec (cetirizine), Claritin  (loratadine ), Allegra (fexofenadine), or Xyzal (levocetirizine) daily as needed. May take twice a day during allergy flares. May switch antihistamines every few months. Recommend allergy injections. 1 injection - DM only.  Make first shot appointment for after knee surgery.  Had a detailed discussion with patient/family that clinical history is suggestive of allergic rhinitis, and may benefit from allergy immunotherapy (AIT). Discussed in detail regarding the dosing, schedule, side effects (mild to moderate local allergic reaction and rarely systemic allergic reactions including anaphylaxis), and benefits (significant improvement in nasal symptoms, seasonal flares of asthma) of immunotherapy with the patient. There is significant time commitment involved with allergy shots, which includes weekly immunotherapy injections for first 9-12 months and then biweekly to monthly injections for 3-5 years. Consent was signed.  Continue montelukast  10mg  daily. Use azelastine  nasal spray 1-2 sprays per nostril twice a day as needed for runny nose/drainage. Nasal saline spray (i.e., Simply Saline) or nasal saline lavage (i.e., NeilMed) is recommended as needed and prior to medicated nasal sprays.  Asthma: Daily controller medication(s): you may try to decrease Symbicort  160mcg 1-2 puffs twice a day with spacer and rinse mouth afterwards. Continue Spiriva  once a day. Continue montelukast  10mg  daily. Continue Tezspire  injections every 4 weeks.  May use albuterol  rescue inhaler 2 puffs every 4 to 6 hours as needed for shortness of breath, chest tightness, coughing, and wheezing. May use albuterol  rescue inhaler 2 puffs 5 to 15 minutes prior to strenuous physical  activities. Monitor frequency of use - if you need to use it more than twice per week on a consistent basis let us  know.  Asthma control goals:  Full participation in all desired activities (may need albuterol  before activity) Albuterol  use two times or less a week on average (not counting use with activity) Cough interfering with sleep two times or less a month Oral steroids no more than once a year No hospitalizations  Reflux Continue with omeprazole  20mg  in the morning.  Follow up in February as scheduled.   Control of House Dust Mite Allergen Dust mite allergens are a common trigger of allergy and asthma symptoms. While they can be found throughout the house, these microscopic creatures thrive in warm, humid environments such as bedding, upholstered furniture and carpeting. Because so much time is spent in the bedroom, it is essential to reduce mite levels there.  Encase pillows, mattresses, and box springs in special allergen-proof fabric covers or airtight, zippered plastic covers.  Bedding should be washed weekly in hot water (130 F) and dried in a hot dryer. Allergen-proof covers are available for comforters and pillows that can't be regularly washed.  Wash the allergy-proof covers every few months. Minimize clutter in the bedroom. Keep pets out of the bedroom.  Keep humidity less than 50% by using a dehumidifier or air conditioning. You can buy a humidity measuring device called a hygrometer to monitor this.  If possible, replace carpets with hardwood, linoleum, or washable area rugs. If that's not possible, vacuum frequently with a vacuum that has a HEPA filter. Remove all upholstered furniture and non-washable window drapes from the bedroom. Remove all non-washable stuffed toys from the bedroom.  Wash stuffed toys weekly.  Cockroach Allergen Avoidance Cockroaches are often found in the homes of densely populated urban areas, schools or commercial buildings, but these creatures  can lurk almost anywhere. This does not mean that you have a dirty house or living area. Block all areas where roaches can enter the home. This includes crevices, wall cracks and windows.  Cockroaches need water to survive, so fix and seal all leaky faucets and pipes. Have an exterminator go through the house when your family and pets are gone to eliminate any remaining roaches. Keep food in lidded containers and put pet food dishes away after your pets are done eating. Vacuum and sweep the floor after meals, and take out garbage and recyclables. Use lidded garbage containers in the kitchen. Wash dishes immediately after use and clean under stoves, refrigerators or toasters where crumbs can accumulate. Wipe off the stove and other kitchen surfaces and cupboards regularly.

## 2024-02-17 NOTE — Progress Notes (Signed)
 Skin testing note  RE: Melanie Armstrong MRN: [id number] DOB: 01/04/1956 Date of Office Visit: 02/17/2024  Referring provider: Arloa Elsie SAUNDERS, MD Primary care provider: Arloa Elsie SAUNDERS, MD  Chief Complaint: skin testing  History of Present Illness: I had the pleasure of seeing Melanie Armstrong for a skin testing visit at the [facility] on 02/17/2024. She is a 68 y.o. female, who is being followed for asthma on Tezspire , allergic rhinitis and GERD. Her previous allergy office visit was on 01/13/2024 with Dr. Luke. Today is a skin testing visit.   Discussed the use of AI scribe software for clinical note transcription with the patient, who gave verbal consent to proceed.    She has a history of allergies and was previously on allergy shots for weed and mold. She now experiences persistent nasal congestion, with recent testing confirming positivity for dust mites and borderline results for cockroach allergens. She acknowledges dust mites as a common issue but rarely encounters cockroaches, stating 'we don't see roaches.'  She manages her symptoms with montelukast  and azelastine  nasal spray, taken as needed. She is considering resuming allergy shots, as her previous regimen did not include dust mites.  She is scheduled for knee replacement surgery in February, which may affect her ability to attend regular appointments.  She has an Epipen  that is valid until November of next year.     Assessment and Plan: Melanie Armstrong is a 68 y.o. female with: Other allergic rhinitis Allergic rhinitis due to dust mite Past history - started AIT on W-M before 2016 and stopped AIT in Dec 2024. No steroid nasal sprays due to glaucoma. Saw ENT and offered surgery. Still has nasal congestion.  Interim history - Wants to retest as off Xolair  now.  02/17/2024 skin testing positive to dust mites and borderline to cockroach. No issues with cockroaches.  Start environmental control measures as below. Use  over the counter antihistamines such as Zyrtec (cetirizine), Claritin  (loratadine ), Allegra (fexofenadine), or Xyzal (levocetirizine) daily as needed. May take twice a day during allergy flares. May switch antihistamines every few months. Recommend allergy injections. 1 injection - DM only.  Make first shot appointment for after knee surgery.  Had a detailed discussion with patient/family that clinical history is suggestive of allergic rhinitis, and may benefit from allergy immunotherapy (AIT). Discussed in detail regarding the dosing, schedule, side effects (mild to moderate local allergic reaction and rarely systemic allergic reactions including anaphylaxis), and benefits (significant improvement in nasal symptoms, seasonal flares of asthma) of immunotherapy with the patient. There is significant time commitment involved with allergy shots, which includes weekly immunotherapy injections for first 9-12 months and then biweekly to monthly injections for 3-5 years. Consent was signed. Continue montelukast  10mg  daily. Use azelastine  nasal spray 1-2 sprays per nostril twice a day as needed for runny nose/drainage. Nasal saline spray (i.e., Simply Saline) or nasal saline lavage (i.e., NeilMed) is recommended as needed and prior to medicated nasal sprays.  Severe persistent asthma without complication Past history - follows with pulmonology for pulmonary nodules. Prefers Symbicort  + Spiriva  over [place]. Hospitalized for Covid-19 pneumonia. Stopped Xolair  in 2025. Started Tezspire  on 10/07/23. Daily controller medication(s): you may try to decrease Symbicort  160mcg 1-2 puffs twice a day with spacer and rinse mouth afterwards. Continue Spiriva  once a day. Continue montelukast  10mg  daily. Continue Tezspire  injections every 4 weeks.  May use albuterol  rescue inhaler 2 puffs every 4 to 6 hours as needed for shortness of breath, chest tightness,  coughing, and wheezing. May use albuterol  rescue inhaler 2 puffs 5  to 15 minutes prior to strenuous physical activities. Monitor frequency of use - if you need to use it more than twice per week on a consistent basis let us  know.  frequency of use - if you need to use it more than twice per week on a consistent basis let us  know.  Get spirometry at next visit. Follow up with pulm regarding CT chest changes.   Gastroesophageal reflux disease, unspecified whether esophagitis present Continue with omeprazole  20mg  in the morning.  Return in about 4 months (around 06/16/2024).  No orders of the defined types were placed in this encounter.  Lab Orders  No laboratory test(s) ordered today    Diagnostics: Skin Testing: Environmental allergy panel. 02/17/2024 skin testing positive to dust mites and borderline to cockroach. Results discussed with patient/family.  Airborne Adult Perc - 02/17/24 0900     Time Antigen Placed 0900    Allergen Manufacturer Jestine    Location Back    Number of Test 55    1. Control-Buffer 50% Glycerol Negative    2. Control-Histamine 3+    3. Bahia Negative    4. Bermuda Negative    5. Johnson Negative    6. [place]  Blue Negative    7. Meadow Fescue Negative    8. Perennial Rye Negative    9. Timothy Negative    10. Ragweed Mix Negative    11. Cocklebur Negative    12. Plantain,  English Negative    13. Baccharis Negative    14. Dog Fennel Negative    15. Russian Thistle Negative    16. Lamb's Quarters Negative    17. Sheep Sorrell Negative    18. Rough Pigweed Negative    19. Marsh Elder, Rough Negative    20. Mugwort, Common Negative    21. Box, Elder Negative    22. Cedar, red Negative    23. Sweet Gum Negative    24. Pecan Pollen Negative    25. Pine Mix Negative    26. Walnut, Black Pollen Negative    27. Red Mulberry Negative    28. Ash Mix Negative    29. Birch Mix Negative    30. Beech American Negative    31. Cottonwood, Eastern Negative    32. Hickory, White Negative    33. Maple Mix Negative    34.  Oak, Eastern Mix Negative    35. Sycamore Eastern Negative    36. Alternaria Alternata Negative    37. Cladosporium Herbarum Negative    38. Aspergillus Mix Negative    39. Penicillium Mix Negative    40. Bipolaris Sorokiniana (Helminthosporium) Negative    41. Drechslera Spicifera (Curvularia) Negative    42. Mucor Plumbeus Negative    43. Fusarium Moniliforme Negative    44. Aureobasidium Pullulans (pullulara) Negative    45. Rhizopus Oryzae Negative    46. Botrytis Cinera Negative    47. Epicoccum Nigrum Negative    48. Phoma Betae Negative    49. Dust Mite Mix Negative    50. Cat Hair 10,000 BAU/ml Negative    51.  Dog Epithelia Negative    52. Mixed Feathers Negative    53. Horse Epithelia Negative    54. Cockroach, German Negative    55. Tobacco Leaf Negative          Intradermal - 02/17/24 0936     Time Antigen Placed 9063    Allergen Manufacturer Jestine  Location Arm    Number of Test 16    Control Negative    Bahia Negative    Bermuda Negative    Johnson Negative    7 Grass Negative    Ragweed Mix Negative    Weed Mix Negative    Tree Mix Negative    Mold 1 Negative    Mold 2 Negative    Mold 3 Negative    Mold 4 Negative    Mite Mix 3+    Cat Negative    Dog Negative    Cockroach --   +/-         Previous notes and tests were reviewed. The plan was reviewed with the patient/family, and all questions/concerned were addressed.  It was my pleasure to see Melanie Armstrong today and participate in her care. Please feel free to contact me with any questions or concerns.  Sincerely,  Orlan Cramp, DO Allergy & Immunology  [facility]  [facility] office: [phone] [place] office: [phone]

## 2024-02-19 ENCOUNTER — Other Ambulatory Visit (HOSPITAL_COMMUNITY)

## 2024-02-22 ENCOUNTER — Encounter: Payer: Self-pay | Admitting: Cardiovascular Disease

## 2024-02-22 ENCOUNTER — Telehealth (HOSPITAL_BASED_OUTPATIENT_CLINIC_OR_DEPARTMENT_OTHER): Payer: Self-pay

## 2024-02-22 ENCOUNTER — Ambulatory Visit (HOSPITAL_COMMUNITY)
Admission: RE | Admit: 2024-02-22 | Discharge: 2024-02-22 | Disposition: A | Discharge status: Home / Self Care | Source: Ambulatory Visit | Attending: Cardiovascular Disease | Admitting: Cardiovascular Disease

## 2024-02-22 DIAGNOSIS — I2510 Atherosclerotic heart disease of native coronary artery without angina pectoris: Secondary | ICD-10-CM | POA: Insufficient documentation

## 2024-02-22 DIAGNOSIS — E7800 Pure hypercholesterolemia, unspecified: Secondary | ICD-10-CM | POA: Insufficient documentation

## 2024-02-22 DIAGNOSIS — I10 Essential (primary) hypertension: Secondary | ICD-10-CM | POA: Diagnosis not present

## 2024-02-22 LAB — ECHOCARDIOGRAM COMPLETE
Area-P 1/2: 4.41 cm2
S' Lateral: 2.4 cm

## 2024-02-22 NOTE — Progress Notes (Signed)
 WILL MAKE WHEN PATIENT IS SCHEDULED

## 2024-02-22 NOTE — Telephone Encounter (Signed)
   Pre-operative Risk Assessment    Patient Name: Melanie Armstrong  DOB: 25-Apr-1955 MRN: [id number]   Date of last office visit: 01/11/24 with Dr. Verlin Date of next office visit: NA   Request for Surgical Clearance    Procedure:  Right Total Knee Arthroplasty  Date of Surgery:  Clearance 06/09/24                                 Surgeon:  Dr. Donnice Car Surgeon's Group or Practice Name:  [facility] Phone number:  [phone] Fax number:  [phone]   Type of Clearance Requested:   - Medical  - Pharmacy:  Hold Aspirin  not indicated   Type of Anesthesia:  Spinal   Additional requests/questions:    SignedAugustin JONETTA Daring   02/22/2024, 10:59 AM

## 2024-02-22 NOTE — Progress Notes (Signed)
 Aeroallergen Immunotherapy   Ordering Provider: Dr. Orlan Cramp   Patient Details  Name: Melanie Armstrong  MRN: [id number]  Date of Birth: November 30, 1955   Order 1 of 1   Vial Label: Dm   0.5 ml (Volume)   AU Concentration -- Mite Mix (DF 5,000 & DP 5,000)    0.5  ml Extract Subtotal  4.5  ml Diluent   5.0  ml Maintenance Total   Schedule:  B  Blue Vial (1:100,000): Schedule B (6 doses)  Yellow Vial (1:10,000): Schedule B (6 doses)  Green Vial (1:1,000): Schedule B (6 doses)  Red Vial (1:100): Schedule A (14doses)   Special Instructions: May come in 1-2 times a week during build up as tolerated. Once a week on red vial.  Once on red vial #1 0.5cc go every 2 weeks, on red vial #2 0.5cc go every 4 weeks. May build up red vials faster (0.1, 0.3, 0.5).

## 2024-02-23 ENCOUNTER — Telehealth: Payer: Self-pay | Admitting: Adult Health

## 2024-02-23 ENCOUNTER — Telehealth (HOSPITAL_BASED_OUTPATIENT_CLINIC_OR_DEPARTMENT_OTHER): Payer: Self-pay | Admitting: *Deleted

## 2024-02-23 NOTE — Telephone Encounter (Signed)
 S/w the pt and she has been scheduled tele preop appt 05/13/24, med rec and consent are done.

## 2024-02-23 NOTE — Telephone Encounter (Signed)
   Name: Melanie Armstrong  DOB: Aug 20, 1955  MRN: [id number]  Primary Cardiologist: Lonni Cash, MD   Preoperative team, please contact this patient and set up a phone call appointment for further preoperative risk assessment. Please obtain consent and complete medication review. Thank you for your help.  I confirm that guidance regarding antiplatelet and oral anticoagulation therapy has been completed and, if necessary, noted below.  Ideally aspirin  should be continued without interruption, however if the bleeding risk is too great, aspirin  may be held for 5-7 days prior to surgery. Please resume aspirin  post operatively when it is felt to be safe from a bleeding standpoint.    I also confirmed the patient resides in the state of [place] . As per [facility] Medical Board telemedicine laws, the patient must reside in the state in which the provider is licensed.    Barnie Hila, NP 02/23/2024, 2:26 PM [facility]

## 2024-02-23 NOTE — Telephone Encounter (Signed)
 S/w the pt and she has been scheduled tele preop appt 05/13/24, med rec and consent are done.      Patient Consent for Virtual Visit        Melanie Armstrong has provided verbal consent on 02/23/2024 for a virtual visit (video or telephone).   CONSENT FOR VIRTUAL VISIT FOR:  Melanie Armstrong  By participating in this virtual visit I agree to the following:  I hereby voluntarily request, consent and authorize [facility] and its employed or contracted physicians, physician assistants, nurse practitioners or other licensed health care professionals (the Practitioner), to provide me with telemedicine health care services (the "Services) as deemed necessary by the treating Practitioner. I acknowledge and consent to receive the Services by the Practitioner via telemedicine. I understand that the telemedicine visit will involve communicating with the Practitioner through live audiovisual communication technology and the disclosure of certain medical information by electronic transmission. I acknowledge that I have been given the opportunity to request an in-person assessment or other available alternative prior to the telemedicine visit and am voluntarily participating in the telemedicine visit.  I understand that I have the right to withhold or withdraw my consent to the use of telemedicine in the course of my care at any time, without affecting my right to future care or treatment, and that the Practitioner or I may terminate the telemedicine visit at any time. I understand that I have the right to inspect all information obtained and/or recorded in the course of the telemedicine visit and may receive copies of available information for a reasonable fee.  I understand that some of the potential risks of receiving the Services via telemedicine include:  Delay or interruption in medical evaluation due to technological equipment failure or disruption; Information transmitted may not be sufficient  (e.g. poor resolution of images) to allow for appropriate medical decision making by the Practitioner; and/or  In rare instances, security protocols could fail, causing a breach of personal health information.  Furthermore, I acknowledge that it is my responsibility to provide information about my medical history, conditions and care that is complete and accurate to the best of my ability. I acknowledge that Practitioner's advice, recommendations, and/or decision may be based on factors not within their control, such as incomplete or inaccurate data provided by me or distortions of diagnostic images or specimens that may result from electronic transmissions. I understand that the practice of medicine is not an exact science and that Practitioner makes no warranties or guarantees regarding treatment outcomes. I acknowledge that a copy of this consent can be made available to me via my patient portal [facility] MyChart), or I can request a printed copy by calling the office of [facility].    I understand that my insurance will be billed for this visit.   I have read or had this consent read to me. I understand the contents of this consent, which adequately explains the benefits and risks of the Services being provided via telemedicine.  I have been provided ample opportunity to ask questions regarding this consent and the Services and have had my questions answered to my satisfaction. I give my informed consent for the services to be provided through the use of telemedicine in my medical care

## 2024-02-23 NOTE — Telephone Encounter (Signed)
 Error, this is a duplicate

## 2024-02-23 NOTE — Telephone Encounter (Signed)
 Fax received from Dr. Donnice Car with [facility] to perform a right total knee arthroplasty under spinal anesthesia on patient.  Patient needs surgery clearance. Surgery is 06/09/24. Patient was seen on 12/08/23. Office protocol is a risk assessment can be sent to surgeon if patient has been seen in 60 days or less.   Pt is scheduled for ov with Madelin Stank, NP 04/19/24. I called her to make sure she understands importance of keeping this visit, so that we can do risk assessment for the surgery. Pt verbalized understanding and will keep planned appt. Routing to clearance pool for follow up.

## 2024-02-24 ENCOUNTER — Telehealth: Payer: Self-pay | Admitting: *Deleted

## 2024-02-24 ENCOUNTER — Ambulatory Visit (INDEPENDENT_AMBULATORY_CARE_PROVIDER_SITE_OTHER)

## 2024-02-24 DIAGNOSIS — J4550 Severe persistent asthma, uncomplicated: Secondary | ICD-10-CM

## 2024-02-24 NOTE — Telephone Encounter (Signed)
 Copied from CRM #[id number]. Topic: Appointments - Appointment Scheduling >> Feb 22, 2024 11:37 AM Leila C wrote: Patient/patient representative is calling to schedule an appointment. Refer to attachments for appointment information.  Patient needs medical clearance for knee surgery 06/09/24 and needs the forms two weeks prior to surgery. Patient states the surgeon Dr. Ernie will fax the preop forms to the office. Patient is scheduled 04/19/24 at 10:30 am with NP, Parrett. Patient verbalized understanding and denies any other concerns. >> Feb 22, 2024 11:43 AM Chantha C wrote: Patient asked to send to NP, Parrett as FYI.  Noted, will route to clearance pool for now until ov is complete.

## 2024-03-24 ENCOUNTER — Ambulatory Visit (INDEPENDENT_AMBULATORY_CARE_PROVIDER_SITE_OTHER): Admitting: *Deleted

## 2024-03-24 DIAGNOSIS — J4550 Severe persistent asthma, uncomplicated: Secondary | ICD-10-CM | POA: Diagnosis not present

## 2024-04-19 ENCOUNTER — Encounter: Payer: Self-pay | Admitting: Adult Health

## 2024-04-19 ENCOUNTER — Ambulatory Visit: Admitting: Adult Health

## 2024-04-19 VITALS — BP 131/79 | HR 80 | Temp 97.5°F | Ht 62.0 in | Wt 211.0 lb

## 2024-04-19 DIAGNOSIS — R918 Other nonspecific abnormal finding of lung field: Secondary | ICD-10-CM

## 2024-04-19 DIAGNOSIS — J4550 Severe persistent asthma, uncomplicated: Secondary | ICD-10-CM | POA: Diagnosis not present

## 2024-04-19 DIAGNOSIS — Z8616 Personal history of COVID-19: Secondary | ICD-10-CM

## 2024-04-19 DIAGNOSIS — Z01811 Encounter for preprocedural respiratory examination: Secondary | ICD-10-CM

## 2024-04-19 DIAGNOSIS — J1282 Pneumonia due to coronavirus disease 2019: Secondary | ICD-10-CM

## 2024-04-19 DIAGNOSIS — R911 Solitary pulmonary nodule: Secondary | ICD-10-CM

## 2024-04-19 NOTE — Progress Notes (Signed)
 "  @Patient  ID: Melanie Armstrong, female    DOB: [birth year], 69 y.o.   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  Indeterminate 9 mm pulmonary nodule in posterior left lower lobe.   CT chest 04/2023 neg for PE, Bilateral aspdz, multifocal pneumonia , LLL harmartoma.  CT chest October 13, 2023 showed resolution of multifocal infiltrates.  Scattered subpleural reticulation and mild interstitial thickening suspected to represent postinflammatory scarring.  Stable 19 mm left lower lobe fat-containing pulmonary nodule consistent with a Hartmatoma, stable pulmonary nodules.  No new nodules    Discussed the use of AI scribe software for clinical note transcription with the patient, who gave verbal consent to proceed.  History of Present Illness She is a 69 year old female who presents for surgical clearance for an upcoming right knee replacement.  She is scheduled for a right knee replacement on February 26 and seeks surgical clearance. She has a history of hip and left knee replacements, which did well from recently . Denies any anesthesia issues.   She has history of pulmonary nodules that have have been stable on surveillance CT imaging.  Stable harmatoma in the left lower lobe since 2019.   She had Covid 19 and Covid pneumonia requiring hospitalization in January 2025 year, which has since resolved completely clinically  and radiographically.  CT chest July 2025 showed resolution of multifocal infiltrates.  Scattered subpleural reticulation and mild interstitial thickening suspected to represent postinflammatory scarring.  Stable [name] left lower lobe.,  Stable pulmonary nodules.  No new nodules noted.  Her asthma is managed by her asthma and allergy  care team.  She remains on Symbicort , Allegra, Singulair , Spiriva  and Tezspire .  Recently seen by Dr. Luke . Denies flare in symptoms.   She has received all her vaccinations, including COVID, flu, and RSV vaccines.       Allergies[1]  Immunization History  Administered Date(s) Administered   Fluzone Influenza virus vaccine,trivalent (IIV3), split virus 12/13/2018   Influenza,inj,Quad PF,6+ Mos 01/26/2018, 12/13/2018   Influenza-Unspecified 01/13/2012, 12/16/2019   PFIZER(Purple Top)SARS-COV-2 Vaccination 07/07/2019, 07/22/2019, 02/03/2020, 07/20/2020   PNEUMOCOCCAL CONJUGATE-20 08/27/2021   Pfizer Covid-19 Vaccine Bivalent Booster 57yrs & up 01/25/2021   Pneumococcal Conjugate-13 04/14/2016   Pneumococcal Polysaccharide-23 04/14/1994   Respiratory Syncytial Virus Vaccine,Recomb Aduvanted(Arexvy) 05/14/2022   Td 12/23/2019   Tdap 12/23/2006, 11/12/2009   Zoster, Live 01/25/2016    Past Medical History:  Diagnosis Date   Anxiety    Arthritis    Asthma    Chronic dermatitis 10/19/2019   Edema    GERD (gastroesophageal reflux disease)    Heart murmur    as a child    Hypertension    Lung nodules    Osteoarthritis    Prediabetes     Tobacco History: Tobacco Use History[2] Counseling given: Not Answered   Outpatient Medications Prior to Visit  Medication Sig Dispense Refill   albuterol  (PROAIR  HFA) 108 (90 Base) MCG/ACT inhaler  Inhale 2 puffs into the lungs every 4 (four) hours as needed. 18 g 1   albuterol  (PROVENTIL ) (2.5 MG/3ML) 0.083% nebulizer solution USE 1 VIAL IN NEBULIZER EVERY 4 HOURS AS NEEDED FOR WHEEZING OR  SHORTNESS  OF   BREATH 600 mL 0   aspirin  EC 81 MG tablet Take 81 mg by mouth daily. Swallow whole.     atorvastatin  (LIPITOR) 40 MG tablet Take 1 tablet (40 mg total) by mouth daily. 30 tablet 3   azelastine  (ASTELIN ) 0.1 % nasal spray Place 1-2 sprays into both nostrils 2 (two) times daily as needed (nasal drainage). Use in each nostril as directed 30 mL 5   betaxolol  (BETOPTIC -S) 0.5 % ophthalmic suspension Place 1 drop into both eyes 2 (two) times daily.     budesonide -formoterol  (SYMBICORT ) 160-4.5 MCG/ACT inhaler Inhale 2 puffs into the lungs in the morning and at bedtime. with spacer and rinse mouth afterwards. 1 each 11   calcium -vitamin D (OSCAL WITH D) 500-200 MG-UNIT per tablet Take 1 tablet by mouth daily with breakfast.     diazepam  (VALIUM ) 5 MG tablet Take 5 mg by mouth every 6 (six) hours as needed for anxiety.     dorzolamide  (TRUSOPT ) 2 % ophthalmic solution Place 1 drop into both eyes 2 (two) times daily.     EPINEPHrine  0.3 mg/0.3 mL IJ SOAJ injection Inject 0.3 mg into the muscle as needed for anaphylaxis. 2 each 1   Ferrous Sulfate  (IRON) 325 (65 Fe) MG TABS Take 1 tablet by mouth daily.     fexofenadine (ALLEGRA) 180 MG tablet Take 180 mg by mouth daily.     furosemide  (LASIX ) 20 MG tablet Take 40 mg by mouth every morning.     glucosamine-chondroitin 500-400 MG tablet Take 1 tablet by mouth 2 (two) times daily.     lisinopril  (ZESTRIL ) 20 MG tablet Take 40 mg by mouth daily.     montelukast  (SINGULAIR ) 10 MG tablet Take 1 tablet (10 mg total) by mouth at bedtime. 90 tablet 3   Multiple Vitamin (MULTIVITAMIN WITH MINERALS) TABS tablet Take 1 tablet by mouth daily.     Omega-3 Fatty Acids (OMEGA 3 PO) Take 1 capsule by mouth daily.     omeprazole  (PRILOSEC) 20 MG capsule Take 1 capsule (20 mg total) by mouth daily. 30 capsule 5   SPIRIVA  HANDIHALER 18 MCG inhalation capsule Place 1 capsule (18 mcg total) into inhaler and inhale daily. 30 capsule 11   vitamin C  (ASCORBIC ACID ) 500 MG tablet  Take 500 mg by mouth daily.     latanoprost  (XALATAN ) 0.005 % ophthalmic solution Place 1 drop into both eyes at bedtime. (Patient not taking: Reported on 04/19/2024)     Facility-Administered Medications Prior to Visit  Medication Dose Route Frequency Provider Last Rate Last Admin   tezepelumab -ekko (TEZSPIRE ) 210 MG/1. syringe 210 mg  210 mg Subcutaneous Q28 days Luke Orlan HERO, DO   210 mg at 03/24/24 1014     Review of Systems:   Constitutional:   No  weight loss, night sweats,  Fevers, chills, fatigue, or  lassitude.  HEENT:   No headaches,  Difficulty swallowing,  Tooth/dental problems, or  Sore throat,                No sneezing, itching, ear ache, nasal congestion, post nasal drip,   CV:  No chest pain,  Orthopnea, PND, swelling in lower extremities, anasarca, dizziness, palpitations, syncope.   GI  No heartburn, indigestion, abdominal pain,  nausea, vomiting, diarrhea, change in bowel habits, loss of appetite, bloody stools.   Resp: No shortness of breath with exertion or at rest.  No excess mucus, no productive cough,  No non-productive cough,  No coughing up of blood.  No change in color of mucus.  No wheezing.  No chest wall deformity  Skin: no rash or lesions.  GU: no dysuria, change in color of urine, no urgency or frequency.  No flank pain, no hematuria   MS:  No joint pain or swelling.  No decreased range of motion.  No back pain.    Physical Exam  BP 131/79   Pulse 80   Temp (!) 97.5 F (36.4 C)   Ht 5' 2 (1.575 m) Comment: per pt  Wt 211 lb (95.7 kg)   SpO2 96% Comment: RA  BMI 38.59 kg/m   GEN: A/Ox3; pleasant , NAD, well nourished    HEENT:  [place]/AT,   NOSE-clear, THROAT-clear, no lesions, no postnasal drip or exudate noted.   NECK:  Supple w/ fair ROM; no JVD; normal carotid impulses w/o bruits; no thyromegaly or nodules palpated; no lymphadenopathy.    RESP  Clear  P & A; w/o, wheezes/ rales/ or rhonchi. no accessory muscle use, no dullness to  percussion  CARD:  RRR, no m/r/g, no peripheral edema, pulses intact, no cyanosis or clubbing.  GI:   Soft & nt; nml bowel sounds; no organomegaly or masses detected.   Musco: Warm bil, no deformities or joint swelling noted.   Neuro: alert, no focal deficits noted.    Skin: Warm, no lesions or rashes    Lab Results:Reviewed 04/19/2024   CBC  BMET  No results found for: PROBNP  Imaging: No results found.  tezepelumab -ekko (TEZSPIRE ) 210 MG/1. syringe 210 mg     Date Action Dose Route User   03/24/2024 1014 Given 210 mg Subcutaneous (Left Arm) Larita Romualdo SAUNDERS, CMA   02/24/2024 0935 Given 210 mg Subcutaneous (Right Arm) Voncannon, Diandre Merica M, CMA           No data to display          No results found for: NITRICOXIDE      No data to display              Assessment & Plan:   Assessment and Plan Assessment & Plan Scattered pulmonary nodules  -stable on surveillance imaging. Stable 19 mm LLL harmatoa. Stable LLL nodule . Follow up in 1 year.   Covid pneumonia  04/2023 -clinically and radiographically resolved Follow-up CT chest October 13, 2023 showed resolution of multifocal bilateral infiltrates.  Patient has residual subpleural reticulation and mild interstitial thickening likely representing postinflammatory scarring.  Clinically symptoms have totally resolved.  Will follow-up in 1 year.  Severe persistent asthma-appears clinically stable.  Patient is followed by asthma and allergy . Continue follow up.   Preop pulmonary risk assessment.  From a pulmonary standpoint patient is followed for scattered pulmonary nodules that have been stable on CT imaging.  Previous pneumonia from January 2025 has clinically and radiographically resolved.  Has done well with previous surgeries and anesthesia.  Would be considered a low surgical risk from pulmonary standpoint.  Have advised patient she will need to follow-up with asthma and allergy  as they follow  her underlying asthma for preop risk assessment.SABRA Madelin Stank, NP 04/19/2024     [1]  Allergies Allergen Reactions   Brimonidine Itching  Other Reaction(s): eye redness   Advair Diskus [Fluticasone -Salmeterol]     Per patient, she thinks she developed walking PNA.    Avelox [Moxifloxacin Hcl In Nacl] Other (See Comments)    Muscle Aches   Ciprofloxacin Hives   Irbesartan Other (See Comments)   Levofloxacin Other (See Comments)    Tendon pain   Olmesartan Other (See Comments)   Other Other (See Comments)   Protonix [Pantoprazole Sodium] Diarrhea   Trimethoprim Hives   Sulfamethoxazole-Trimethoprim Rash  [2]  Social History Tobacco Use  Smoking Status Never  Smokeless Tobacco Never   "

## 2024-04-19 NOTE — Patient Instructions (Addendum)
 Activity as tolerated  Continue on Asthma regimen and follow up with Asthma Allergy .  Good luck with upcoming surgery.  Follow up in 1 year with Dr. Geronimo and As needed

## 2024-04-20 ENCOUNTER — Ambulatory Visit

## 2024-04-27 NOTE — Telephone Encounter (Signed)
 Tammy's note from 16/26 was faxed to [facility].

## 2024-04-29 ENCOUNTER — Ambulatory Visit

## 2024-04-29 DIAGNOSIS — J4550 Severe persistent asthma, uncomplicated: Secondary | ICD-10-CM

## 2024-05-03 ENCOUNTER — Ambulatory Visit

## 2024-05-11 NOTE — Progress Notes (Unsigned)
 "  NEUROLOGY FOLLOW UP OFFICE NOTE  Melanie Armstrong [id number]  Assessment/Plan:   Transient ischemic attack x 2 First episode of transient left sided numbness may have been right thalamic TIA (not corresponding to chronic left thalamic infarct on MRI) Second episode of monocular polyplopia followed by expressive aphasia - less clear but still may be TIA (patient denies history of migraines) Hypertension   Secondary stroke prevention: ASA 81mg  daily LDL goal less than 70 Hgb J8r goal less than 7 Normotensive blood pressure. Mediterranean diet Follow up 6 months.   Subjective:  Melanie Armstrong is a 69 year old right-handed female with HTN, prediabetes, asthma and osteoarthritis who follows up for TIA.  History supplemented by referring provider's note and her accompanying daughter.   UPDATE: Current medications:  ASA 81mg  daily, atorvastatin  40mg  daily, furosemide  40mg  daily,   Continued TIA workup.  Zio patch monitor from 7/9-7/23/2025 revealed no a fib or significant block.  2D echo on 02/22/2024 demonstrated EF 65-70% with calcification of the aortic valve but no significant valvular disease and no atrial level shunt.  LDL on 01/28/2024 was 42.    ***  HISTORY: On 07/28/2023 had 2 stents implanted in her right eyes due to increased IOP from her glaucoma.  She was off of ASA for a week but restarted that night.  The following day, she developed left sided numbness involving face, arm and leg lasting 10-15 minutes.  No associated facial droop or unilateral weakness.  No associated headache or visual changes.  Since then, she was restarted on her anti-hypertensive medications as well.  Previously, on 09/05/2022, she woke up from sleep to go to the bathroom and noticed that she noticed images in her vision of her right eye were stacked (several of them).  She closed her left eye and vision was fine.  However, she denied vision loss, eye pain, or headache.  She went back to sleep.  When  she woke up about 90 minutes later, her vision was still altered and she was confused.  When she was asked the name of her dog, she kept saying her previous dog's name.  She was unable to correctly say her children's birthdays (date was correct but years were wrong).  Denied slurred speech, facial droop or unilateral numbness or weakness.  She felt tired and kept yawning and went back to sleep.  She woke up about 40 minutes later and all symptoms were resolved.  She checked her blood pressure at that time and it was in the 130s/80s.     Carotid ultrasound on 09/18/2023 revealed heterogenous atherosclerotic plaque in the proximal right ICA causing mild (1-49%) stenosis but no plaque or evidence of stenosis in the left ICA.  MRI of brain without contrast on 09/26/2023 revealed mild chronic small vessel ischemic changes in the cerebral white matter with small chronic left thalamic infarct but no acute or subacute abnormalities.    In January 2025, she had COVID, hospitalized for 11 days.    Denies palpitations. Denies history of migraines  PAST MEDICAL HISTORY: Past Medical History:  Diagnosis Date   Anxiety    Arthritis    Asthma    Chronic dermatitis 10/19/2019   Edema    GERD (gastroesophageal reflux disease)    Heart murmur    as a child    Hypertension    Lung nodules    Osteoarthritis    Prediabetes     MEDICATIONS: Medications Ordered Prior to Encounter[1]  ALLERGIES: Allergies[2]  FAMILY HISTORY: Family History  Problem Relation Age of Onset   Hypertension Mother    Cancer Mother    Heart disease Father    Heart attack Maternal Grandmother    Cancer Maternal Grandfather    Stroke Paternal Grandmother    Stroke Paternal Grandfather    Allergic rhinitis Neg Hx    Angioedema Neg Hx    Asthma Neg Hx    Eczema Neg Hx    Immunodeficiency Neg Hx    Urticaria Neg Hx       Objective:  *** General: No acute distress.  Patient appears ***-groomed.   Head:   Normocephalic/atraumatic Eyes:  Fundi examined but not visualized Neck: supple, no paraspinal tenderness, full range of motion Heart:  Regular rate and rhythm Neurological Exam: alert and oriented.  Speech fluent and not dysarthric, language intact.  CN II-XII intact. Bulk and tone normal, muscle strength 5/5 throughout.  Sensation to light touch intact.  Deep tendon reflexes 2+ throughout, toes downgoing.  Finger to nose testing intact.  Gait normal, Romberg negative.   Juliene Dunnings, DO  CC: ***          [1]  Current Outpatient Medications on File Prior to Visit  Medication Sig Dispense Refill   albuterol  (PROAIR  HFA) 108 (90 Base) MCG/ACT inhaler Inhale 2 puffs into the lungs every 4 (four) hours as needed. 18 g 1   albuterol  (PROVENTIL ) (2.5 MG/3ML) 0.083% nebulizer solution USE 1 VIAL IN NEBULIZER EVERY 4 HOURS AS NEEDED FOR WHEEZING OR  SHORTNESS  OF  BREATH 600 mL 0   aspirin  EC 81 MG tablet Take 81 mg by mouth daily. Swallow whole.     atorvastatin  (LIPITOR) 40 MG tablet Take 1 tablet (40 mg total) by mouth daily. 30 tablet 3   azelastine  (ASTELIN ) 0.1 % nasal spray Place 1-2 sprays into both nostrils 2 (two) times daily as needed (nasal drainage). Use in each nostril as directed 30 mL 5   betaxolol  (BETOPTIC -S) 0.5 % ophthalmic suspension Place 1 drop into both eyes 2 (two) times daily.     budesonide -formoterol  (SYMBICORT ) 160-4.5 MCG/ACT inhaler Inhale 2 puffs into the lungs in the morning and at bedtime. with spacer and rinse mouth afterwards. 1 each 11   calcium -vitamin D (OSCAL WITH D) 500-200 MG-UNIT per tablet Take 1 tablet by mouth daily with breakfast.     diazepam  (VALIUM ) 5 MG tablet Take 5 mg by mouth every 6 (six) hours as needed for anxiety.     dorzolamide  (TRUSOPT ) 2 % ophthalmic solution Place 1 drop into both eyes 2 (two) times daily.     EPINEPHrine  0.3 mg/0.3 mL IJ SOAJ injection Inject 0.3 mg into the muscle as needed for anaphylaxis. 2 each 1   Ferrous  Sulfate (IRON) 325 (65 Fe) MG TABS Take 1 tablet by mouth daily.     fexofenadine (ALLEGRA) 180 MG tablet Take 180 mg by mouth daily.     furosemide  (LASIX ) 20 MG tablet Take 40 mg by mouth every morning.     glucosamine-chondroitin 500-400 MG tablet Take 1 tablet by mouth 2 (two) times daily.     latanoprost  (XALATAN ) 0.005 % ophthalmic solution Place 1 drop into both eyes at bedtime. (Patient not taking: Reported on 04/19/2024)     lisinopril  (ZESTRIL ) 20 MG tablet Take 40 mg by mouth daily.     montelukast  (SINGULAIR ) 10 MG tablet Take 1 tablet (10 mg total) by mouth at bedtime. 90 tablet 3   Multiple Vitamin (  MULTIVITAMIN WITH MINERALS) TABS tablet Take 1 tablet by mouth daily.     Omega-3 Fatty Acids (OMEGA 3 PO) Take 1 capsule by mouth daily.     omeprazole  (PRILOSEC) 20 MG capsule Take 1 capsule (20 mg total) by mouth daily. 30 capsule 5   SPIRIVA  HANDIHALER 18 MCG inhalation capsule Place 1 capsule (18 mcg total) into inhaler and inhale daily. 30 capsule 11   vitamin C  (ASCORBIC ACID ) 500 MG tablet Take 500 mg by mouth daily.     Current Facility-Administered Medications on File Prior to Visit  Medication Dose Route Frequency Provider Last Rate Last Admin   tezepelumab -ekko (TEZSPIRE ) 210 MG/1. syringe 210 mg  210 mg Subcutaneous Q28 days Luke Orlan HERO, DO   210 mg at 04/29/24 1421  [2]  Allergies Allergen Reactions   Brimonidine Itching    Other Reaction(s): eye redness   Advair Diskus [Fluticasone -Salmeterol]     Per patient, she thinks she developed walking PNA.    Avelox [Moxifloxacin Hcl In Nacl] Other (See Comments)    Muscle Aches   Ciprofloxacin Hives   Irbesartan Other (See Comments)   Levofloxacin Other (See Comments)    Tendon pain   Olmesartan Other (See Comments)   Other Other (See Comments)   Protonix [Pantoprazole Sodium] Diarrhea   Trimethoprim Hives   Sulfamethoxazole-Trimethoprim Rash   "

## 2024-05-12 ENCOUNTER — Encounter: Payer: Self-pay | Admitting: Neurology

## 2024-05-12 ENCOUNTER — Ambulatory Visit (INDEPENDENT_AMBULATORY_CARE_PROVIDER_SITE_OTHER): Admitting: Neurology

## 2024-05-12 VITALS — BP 159/81 | HR 78 | Ht 62.0 in | Wt 213.6 lb

## 2024-05-12 DIAGNOSIS — E785 Hyperlipidemia, unspecified: Secondary | ICD-10-CM

## 2024-05-12 DIAGNOSIS — I10 Essential (primary) hypertension: Secondary | ICD-10-CM

## 2024-05-12 DIAGNOSIS — G459 Transient cerebral ischemic attack, unspecified: Secondary | ICD-10-CM | POA: Diagnosis not present

## 2024-05-12 NOTE — Patient Instructions (Addendum)
 Continue aspirin  and atorvastatin  as well as furosemide  and lisin opril Mediterranean diet  Routine exercise Fasting lipid panel a week prior to follow up.    Mediterranean Diet A Mediterranean diet is based on the traditions of countries on the [employer]. It focuses on eating more: Fruits and vegetables. Whole grains, beans, nuts, and seeds. Heart-healthy fats. These are fats that are good for your heart. It involves eating less: Dairy. Meat and eggs. Processed foods with added sugar, salt, and fat. This type of diet can help prevent certain conditions. It can also improve outcomes if you have a long-term (chronic) disease, such as kidney or heart disease. What are tips for following this plan? Reading food labels Check packaged foods for: The serving size. For foods such as rice and pasta, the serving size is the amount of cooked product, not dry. The total fat. Avoid foods with saturated fat or trans fat. Added sugars, such as corn syrup. Shopping  Try to have a balanced diet. Buy a variety of foods, such as: Fresh fruits and vegetables. You may be able to get these from local farmers markets. You can also buy them frozen. Grains, beans, nuts, and seeds. Some of these can be bought in bulk. Fresh seafood. Poultry and eggs. Low-fat dairy products. Buy whole ingredients instead of foods that have already been packaged. If you can't get fresh seafood, buy precooked frozen shrimp or canned fish, such as tuna, salmon, or sardines. Stock your pantry so you always have certain foods on hand, such as olive oil, canned tuna, canned tomatoes, rice, pasta, and beans. Cooking Cook foods with extra-virgin olive oil instead of using butter or other vegetable oils. Have meat as a side dish. Have vegetables or grains as your main dish. This means having meat in small portions or adding small amounts of meat to foods like pasta or stew. Use beans or vegetables instead of meat in common  dishes like chili or lasagna. Try out different cooking methods. Try roasting, broiling, steaming, and sauting vegetables. Add frozen vegetables to soups, stews, pasta, or rice. Add nuts or seeds for added healthy fats and plant protein at each meal. You can add these to yogurt, salads, or vegetable dishes. Marinate fish or vegetables using olive oil, lemon juice, garlic, and fresh herbs. Meal planning Plan to eat a vegetarian meal one day each week. Try to work up to two vegetarian meals, if possible. Eat seafood two or more times a week. Have healthy snacks on hand. These may include: Vegetable sticks with hummus. Greek yogurt. Fruit and nut trail mix. Eat balanced meals. These should include: Fruit: 2-3 servings a day. Vegetables: 4-5 servings a day. Low-fat dairy: 2 servings a day. Fish, poultry, or lean meat: 1 serving a day. Beans and legumes: 2 or more servings a week. Nuts and seeds: 1-2 servings a day. Whole grains: 6-8 servings a day. Extra-virgin olive oil: 3-4 servings a day. Limit red meat and sweets to just a few servings a month. Lifestyle  Try to cook and eat meals with your family. Drink enough fluid to keep your pee (urine) pale yellow. Be active every day. This includes: Aerobic exercise, which is exercise that causes your heart to beat faster. Examples include running and swimming. Leisure activities like gardening, walking, or housework. Get 7-8 hours of sleep each night. Drink red wine if your provider says you can. A glass of wine is 5 oz (150 mL). You may be allowed to have: Up to  1 glass a day if you're female and not pregnant. Up to 2 glasses a day if you're female. What foods should I eat? Fruits Apples. Apricots. Avocado. Berries. Bananas. Cherries. Dates. Figs. Grapes. Lemons. Melon. Oranges. Peaches. Plums. Pomegranate. Vegetables Artichokes. Beets. Broccoli. Cabbage. Carrots. Eggplant. Green beans. Chard. Kale. Spinach. Onions. Leeks. Peas. Squash.  Tomatoes. Peppers. Radishes. Grains Whole-grain pasta. Brown rice. Bulgur wheat. Polenta. Couscous. Whole-wheat bread. [place] Madeira. Meats and other proteins Beans. Almonds. Sunflower seeds. Pine nuts. Peanuts. Cod. Salmon. Scallops. Shrimp. Tuna. Tilapia. Clams. Oysters. Eggs. Chicken or turkey without skin. Dairy Low-fat milk. Cheese. Greek yogurt. Fats and oils Extra-virgin olive oil. Avocado oil. Grapeseed oil. Beverages Water. Red wine. Herbal tea. Sweets and desserts Greek yogurt with honey. Baked apples. Poached pears. Trail mix. Seasonings and condiments Basil. Cilantro. Coriander. Cumin. Mint. Parsley. Sage. Rosemary. Tarragon. Garlic. Oregano. Thyme. Pepper. Balsamic vinegar. Tahini. Hummus. Tomato sauce. Olives. Mushrooms. The items listed above may not be all the foods and drinks you can have. Talk to a dietitian to learn more. What foods should I limit? This is a list of foods that should be eaten rarely. Fruits Fruit canned in syrup. Vegetables Deep-fried potatoes, like French fries. Grains Packaged pasta or rice dishes. Cereal with added sugar. Snacks with added sugar. Meats and other proteins Beef. Pork. Lamb. Chicken or turkey with skin. Hot dogs. [place]. Dairy Ice cream. Sour cream. Whole milk. Fats and oils Butter. Canola oil. Vegetable oil. Beef fat (tallow). Lard. Beverages Juice. Sugar-sweetened soft drinks. Beer. Liquor and spirits. Sweets and desserts Cookies. Cakes. Pies. Candy. Seasonings and condiments Mayonnaise. Pre-made sauces and marinades. The items listed above may not be all the foods and drinks you should limit. Talk to a dietitian to learn more. Where to find more information American Heart Association (AHA): heart.org This information is not intended to replace advice given to you by your health care provider. Make sure you discuss any questions you have with your health care provider. Document Revised: 07/13/2022 Document Reviewed:  07/13/2022 Elsevier Patient Education  2024 [employer].

## 2024-05-13 ENCOUNTER — Ambulatory Visit: Attending: Internal Medicine

## 2024-05-13 DIAGNOSIS — Z01810 Encounter for preprocedural cardiovascular examination: Secondary | ICD-10-CM | POA: Diagnosis present

## 2024-05-13 NOTE — Progress Notes (Signed)
 "   Virtual Visit via Telephone Note   Because of ARNOLD DEPINTO co-morbid illnesses, she is at least at moderate risk for complications without adequate follow up.  This format is felt to be most appropriate for this patient at this time.  Due to technical limitations with video connection (technology), today's appointment will be conducted as an audio only telehealth visit, and MIRKA BARBONE verbally agreed to proceed in this manner.   All issues noted in this document were discussed and addressed.  No physical exam could be performed with this format.  Evaluation Performed:  Preoperative cardiovascular risk assessment _____________   Date:  05/13/2024   Patient ID:  Melanie Armstrong, DOB [birth year], MRN [id number] Patient Location:  Home Provider location:   Office  Primary Care Provider:  Arloa Elsie SAUNDERS, MD Primary Cardiologist:  Lonni Cash, MD  Chief Complaint / Patient Profile   69 y.o. y/o female with a h/o anxiety, asthma, GERD, HTN, CAD, chronic diastolic CHF, HLD who is pending right total knee arthroplasty on 06/09/2024 and presents today for telephonic preoperative cardiovascular risk assessment.  History of Present Illness    Melanie Armstrong is a 69 y.o. female who presents via audio/video conferencing for a telehealth visit today.  Pt was last seen in cardiology clinic on 02/23/2024 by Dr. Cash.  At that time Melanie Armstrong was doing well, negative stress test 02/10/2024, echo 02/22/2024 demonstrated LVEF 65-70%, no RWMA, no significant valvular disease.  The patient is now pending procedure as outlined above. Since her last visit, She denies chest pain, palpitations, dyspnea, orthopnea, n, v, dark/tarry/bloody stools, hematuria, dizziness, syncope, edema, weight gain.     Past Medical History    Past Medical History:  Diagnosis Date   Anxiety    Arthritis    Asthma    Chronic dermatitis 10/19/2019   Edema    GERD (gastroesophageal reflux disease)     Heart murmur    as a child    Hypertension    Lung nodules    Osteoarthritis    Prediabetes    Past Surgical History:  Procedure Laterality Date   CHOLECYSTECTOMY     eye stin Right 07/28/2023   REPLACEMENT TOTAL KNEE     ROTATOR CUFF REPAIR     TONSILLECTOMY     TOTAL HIP ARTHROPLASTY Right 08/22/2014   Procedure: RIGHT TOTAL HIP ARTHROPLASTY ANTERIOR APPROACH;  Surgeon: Donnice Car, MD;  Location: [facility];  Service: Orthopedics;  Laterality: Right;   TOTAL HIP ARTHROPLASTY Left 01/2019    Allergies  Allergies[1]  Home Medications    Prior to Admission medications  Medication Sig Start Date End Date Taking? Authorizing Provider  albuterol  (PROAIR  HFA) 108 (90 Base) MCG/ACT inhaler Inhale 2 puffs into the lungs every 4 (four) hours as needed. 01/10/19   Bobbitt, Elgin Pepper, MD  albuterol  (PROVENTIL ) (2.5 MG/3ML) 0.083% nebulizer solution USE 1 VIAL IN NEBULIZER EVERY 4 HOURS AS NEEDED FOR WHEEZING OR  SHORTNESS  OF  BREATH 06/22/17   Bobbitt, Elgin Pepper, MD  aspirin  EC 81 MG tablet Take 81 mg by mouth daily. Swallow whole.    [provider]  atorvastatin  (LIPITOR) 40 MG tablet Take 1 tablet (40 mg total) by mouth daily. 01/29/24   Skeet Juliene SAUNDERS, DO  azelastine  (ASTELIN ) 0.1 % nasal spray Place 1-2 sprays into both nostrils 2 (two) times daily as needed (nasal drainage). Use in each nostril as directed 01/13/24   Luke Orlan HERO, DO  betaxolol  (BETOPTIC -S) 0.5 % ophthalmic suspension Place 1 drop into both eyes 2 (two) times daily. 06/05/22   [provider]  budesonide -formoterol  (SYMBICORT ) 160-4.5 MCG/ACT inhaler Inhale 2 puffs into the lungs in the morning and at bedtime. with spacer and rinse mouth afterwards. 01/13/24   Luke Orlan HERO, DO  calcium -vitamin D (OSCAL WITH D) 500-200 MG-UNIT per tablet Take 1 tablet by mouth daily with breakfast.    [provider]  diazepam  (VALIUM ) 5 MG tablet Take 5 mg by mouth every 6 (six) hours as needed for anxiety.     [provider]  dorzolamide  (TRUSOPT ) 2 % ophthalmic solution Place 1 drop into both eyes 2 (two) times daily.    [provider]  EPINEPHrine  0.3 mg/0.3 mL IJ SOAJ injection Inject 0.3 mg into the muscle as needed for anaphylaxis. 09/16/23   Luke Orlan HERO, DO  Ferrous Sulfate  (IRON) 325 (65 Fe) MG TABS Take 1 tablet by mouth daily.    [provider]  fexofenadine (ALLEGRA) 180 MG tablet Take 180 mg by mouth daily.    [provider]  furosemide  (LASIX ) 20 MG tablet Take 40 mg by mouth every morning.    [provider]  glucosamine-chondroitin 500-400 MG tablet Take 1 tablet by mouth 2 (two) times daily.    [provider]  lisinopril  (ZESTRIL ) 20 MG tablet Take 40 mg by mouth daily. 02/13/20   [provider]  montelukast  (SINGULAIR ) 10 MG tablet Take 1 tablet (10 mg total) by mouth at bedtime. 01/13/24   Luke Orlan HERO, DO  Multiple Vitamin (MULTIVITAMIN WITH MINERALS) TABS tablet Take 1 tablet by mouth daily.    [provider]  Omega-3 Fatty Acids (OMEGA 3 PO) Take 1 capsule by mouth daily.    [provider]  omeprazole  (PRILOSEC) 20 MG capsule Take 1 capsule (20 mg total) by mouth daily. 11/26/15   Bobbitt, Elgin Pepper, MD  prednisoLONE acetate (PRED FORTE) 1 % ophthalmic suspension Place 1 drop into the right eye 4 (four) times daily. 03/16/24   [provider]  ROCKLATAN 0.02-0.005 % SOLN Place 1 drop into the left eye at bedtime. 03/23/24   [provider]  SPIRIVA  HANDIHALER 18 MCG inhalation capsule Place 1 capsule (18 mcg total) into inhaler and inhale daily. 01/13/24   Luke Orlan HERO, DO  vitamin C  (ASCORBIC ACID ) 500 MG tablet Take 500 mg by mouth daily.    [provider]    Physical Exam    Vital Signs:  ASHANTIA AMARAL does not have vital signs available for review today.  Given telephonic nature of communication, physical exam is limited. AAOx3. NAD. Normal affect.  Speech and  respirations are unlabored.  Accessory Clinical Findings    None  Assessment & Plan    1.  Preoperative Cardiovascular Risk Assessment: According to the Revised Cardiac Risk Index (RCRI), her Perioperative Risk of Major Cardiac Event is (%): 0.9  Her Functional Capacity in METs is: 4.06 according to the Duke Activity Status Index (DASI). Therefore, based on ACC/AHA guidelines, patient would be at acceptable risk for the planned procedure without further cardiovascular testing.   The patient was advised that if she develops new symptoms prior to surgery to contact our office to arrange for a follow-up visit, and she verbalized understanding.  Ideally aspirin  should be continued without interruption, however if the bleeding risk is too great, aspirin  may be held for 5-7 days prior to surgery. Please resume aspirin  post operatively  when it is felt to be safe from a bleeding standpoint.    A copy of this note will be routed to requesting surgeon.  Time:   Today, I have spent 10 minutes with the patient with telehealth technology discussing medical history, symptoms, and management plan.     Caley Volkert E Kristine Chahal, NP  05/13/2024, 7:21 AM     [1]  Allergies Allergen Reactions   Brimonidine Itching    Other Reaction(s): eye redness   Advair Diskus [Fluticasone -Salmeterol]     Per patient, she thinks she developed walking PNA.    Avelox [Moxifloxacin Hcl In Nacl] Other (See Comments)    Muscle Aches   Ciprofloxacin Hives   Irbesartan Other (See Comments)   Levofloxacin Other (See Comments)    Tendon pain   Olmesartan Other (See Comments)   Other Other (See Comments)   Protonix [Pantoprazole Sodium] Diarrhea   Trimethoprim Hives   Sulfamethoxazole-Trimethoprim Rash   "

## 2024-05-23 ENCOUNTER — Ambulatory Visit: Admitting: Allergy

## 2024-05-31 ENCOUNTER — Ambulatory Visit

## 2024-11-18 ENCOUNTER — Ambulatory Visit: Payer: Self-pay | Admitting: Neurology
